# Patient Record
Sex: Female | Born: 1950 | Race: White | Hispanic: No | Marital: Married | Smoking: Never smoker
Health system: Southern US, Community
[De-identification: ages and names within clinical notes are randomized; demographics above are authoritative.]

## PROBLEM LIST (undated history)

## (undated) DIAGNOSIS — N979 Female infertility, unspecified: Secondary | ICD-10-CM

## (undated) DIAGNOSIS — E282 Polycystic ovarian syndrome: Secondary | ICD-10-CM

## (undated) DIAGNOSIS — Z974 Presence of external hearing-aid: Secondary | ICD-10-CM

## (undated) DIAGNOSIS — H8092 Unspecified otosclerosis, left ear: Secondary | ICD-10-CM

## (undated) DIAGNOSIS — F32A Depression, unspecified: Secondary | ICD-10-CM

## (undated) DIAGNOSIS — K829 Disease of gallbladder, unspecified: Secondary | ICD-10-CM

## (undated) DIAGNOSIS — R5383 Other fatigue: Secondary | ICD-10-CM

## (undated) DIAGNOSIS — H9190 Unspecified hearing loss, unspecified ear: Secondary | ICD-10-CM

## (undated) DIAGNOSIS — H838X3 Other specified diseases of inner ear, bilateral: Secondary | ICD-10-CM

## (undated) DIAGNOSIS — B0229 Other postherpetic nervous system involvement: Secondary | ICD-10-CM

## (undated) DIAGNOSIS — K869 Disease of pancreas, unspecified: Secondary | ICD-10-CM

## (undated) DIAGNOSIS — E559 Vitamin D deficiency, unspecified: Secondary | ICD-10-CM

## (undated) DIAGNOSIS — K5900 Constipation, unspecified: Secondary | ICD-10-CM

## (undated) DIAGNOSIS — R42 Dizziness and giddiness: Secondary | ICD-10-CM

## (undated) DIAGNOSIS — A630 Anogenital (venereal) warts: Secondary | ICD-10-CM

## (undated) DIAGNOSIS — Z8711 Personal history of peptic ulcer disease: Secondary | ICD-10-CM

## (undated) DIAGNOSIS — E739 Lactose intolerance, unspecified: Secondary | ICD-10-CM

## (undated) DIAGNOSIS — E538 Deficiency of other specified B group vitamins: Secondary | ICD-10-CM

## (undated) DIAGNOSIS — F329 Major depressive disorder, single episode, unspecified: Secondary | ICD-10-CM

## (undated) DIAGNOSIS — H905 Unspecified sensorineural hearing loss: Secondary | ICD-10-CM

## (undated) DIAGNOSIS — F419 Anxiety disorder, unspecified: Secondary | ICD-10-CM

## (undated) DIAGNOSIS — Z9889 Other specified postprocedural states: Secondary | ICD-10-CM

## (undated) DIAGNOSIS — H9192 Unspecified hearing loss, left ear: Secondary | ICD-10-CM

## (undated) DIAGNOSIS — R112 Nausea with vomiting, unspecified: Secondary | ICD-10-CM

## (undated) HISTORY — DX: Other fatigue: R53.83

## (undated) HISTORY — DX: Dizziness and giddiness: R42

## (undated) HISTORY — DX: Disease of gallbladder, unspecified: K82.9

## (undated) HISTORY — DX: Polycystic ovarian syndrome: E28.2

## (undated) HISTORY — DX: Vitamin D deficiency, unspecified: E55.9

## (undated) HISTORY — DX: Female infertility, unspecified: N97.9

## (undated) HISTORY — DX: Disease of pancreas, unspecified: K86.9

## (undated) HISTORY — PX: STRABISMUS SURGERY: SHX218

## (undated) HISTORY — DX: Constipation, unspecified: K59.00

## (undated) HISTORY — DX: Lactose intolerance, unspecified: E73.9

## (undated) HISTORY — DX: Other postherpetic nervous system involvement: B02.29

## (undated) HISTORY — DX: Depression, unspecified: F32.A

## (undated) HISTORY — PX: STAPEDES SURGERY: SHX789

## (undated) HISTORY — DX: Anxiety disorder, unspecified: F41.9

---
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
## 1997-05-05 ENCOUNTER — Encounter: Admission: RE | Admit: 1997-05-05 | Discharge: 1997-05-05 | Payer: Self-pay | Admitting: Sports Medicine

## 1997-09-22 ENCOUNTER — Encounter: Admission: RE | Admit: 1997-09-22 | Discharge: 1997-09-22 | Payer: Self-pay | Admitting: Family Medicine

## 1997-11-25 ENCOUNTER — Ambulatory Visit: Admission: RE | Admit: 1997-11-25 | Discharge: 1997-11-25 | Payer: Self-pay | Admitting: Family Medicine

## 1997-12-11 ENCOUNTER — Ambulatory Visit (HOSPITAL_COMMUNITY): Admission: RE | Admit: 1997-12-11 | Discharge: 1997-12-11 | Payer: Self-pay | Admitting: Family Medicine

## 1997-12-12 ENCOUNTER — Encounter: Admission: RE | Admit: 1997-12-12 | Discharge: 1997-12-12 | Payer: Self-pay | Admitting: Sports Medicine

## 1997-12-19 ENCOUNTER — Encounter: Admission: RE | Admit: 1997-12-19 | Discharge: 1997-12-19 | Payer: Self-pay | Admitting: Family Medicine

## 1998-02-07 ENCOUNTER — Ambulatory Visit (HOSPITAL_COMMUNITY): Admission: RE | Admit: 1998-02-07 | Discharge: 1998-02-07 | Payer: Self-pay | Admitting: Otolaryngology

## 1998-02-07 ENCOUNTER — Encounter: Payer: Self-pay | Admitting: Otolaryngology

## 1998-02-08 ENCOUNTER — Ambulatory Visit (HOSPITAL_BASED_OUTPATIENT_CLINIC_OR_DEPARTMENT_OTHER): Admission: RE | Admit: 1998-02-08 | Discharge: 1998-02-08 | Payer: Self-pay | Admitting: Otolaryngology

## 1998-02-26 ENCOUNTER — Encounter: Admission: RE | Admit: 1998-02-26 | Discharge: 1998-02-26 | Payer: Self-pay | Admitting: Sports Medicine

## 1998-04-20 ENCOUNTER — Encounter: Admission: RE | Admit: 1998-04-20 | Discharge: 1998-04-20 | Payer: Self-pay | Admitting: Family Medicine

## 1998-06-26 ENCOUNTER — Ambulatory Visit (HOSPITAL_COMMUNITY): Admission: RE | Admit: 1998-06-26 | Discharge: 1998-06-26 | Payer: Self-pay | Admitting: *Deleted

## 1998-12-13 ENCOUNTER — Ambulatory Visit (HOSPITAL_COMMUNITY): Admission: RE | Admit: 1998-12-13 | Discharge: 1998-12-13 | Payer: Self-pay | Admitting: Family Medicine

## 1998-12-13 ENCOUNTER — Encounter: Payer: Self-pay | Admitting: Family Medicine

## 1999-01-06 ENCOUNTER — Emergency Department (HOSPITAL_COMMUNITY): Admission: EM | Admit: 1999-01-06 | Discharge: 1999-01-06 | Payer: Self-pay | Admitting: Emergency Medicine

## 1999-02-18 ENCOUNTER — Encounter: Admission: RE | Admit: 1999-02-18 | Discharge: 1999-02-18 | Payer: Self-pay | Admitting: Family Medicine

## 1999-02-22 ENCOUNTER — Other Ambulatory Visit: Admission: RE | Admit: 1999-02-22 | Discharge: 1999-02-22 | Payer: Self-pay | Admitting: Obstetrics and Gynecology

## 1999-10-07 ENCOUNTER — Encounter: Admission: RE | Admit: 1999-10-07 | Discharge: 1999-10-07 | Payer: Self-pay | Admitting: Sports Medicine

## 1999-11-08 ENCOUNTER — Encounter: Admission: RE | Admit: 1999-11-08 | Discharge: 1999-11-08 | Payer: Self-pay | Admitting: Family Medicine

## 1999-11-14 ENCOUNTER — Encounter: Admission: RE | Admit: 1999-11-14 | Discharge: 1999-11-14 | Payer: Self-pay | Admitting: Family Medicine

## 1999-12-18 ENCOUNTER — Encounter: Payer: Self-pay | Admitting: Family Medicine

## 1999-12-18 ENCOUNTER — Ambulatory Visit (HOSPITAL_COMMUNITY): Admission: RE | Admit: 1999-12-18 | Discharge: 1999-12-18 | Payer: Self-pay | Admitting: Family Medicine

## 2000-01-14 HISTORY — PX: LASIK: SHX215

## 2000-01-22 ENCOUNTER — Encounter: Admission: RE | Admit: 2000-01-22 | Discharge: 2000-01-22 | Payer: Self-pay | Admitting: Family Medicine

## 2000-02-14 ENCOUNTER — Encounter (INDEPENDENT_AMBULATORY_CARE_PROVIDER_SITE_OTHER): Payer: Self-pay | Admitting: *Deleted

## 2000-02-18 ENCOUNTER — Encounter: Admission: RE | Admit: 2000-02-18 | Discharge: 2000-02-18 | Payer: Self-pay | Admitting: Sports Medicine

## 2000-03-05 ENCOUNTER — Ambulatory Visit (HOSPITAL_COMMUNITY): Admission: RE | Admit: 2000-03-05 | Discharge: 2000-03-05 | Payer: Self-pay | Admitting: Ophthalmology

## 2000-03-05 HISTORY — PX: BLEPHAROPLASTY: SUR158

## 2000-04-15 ENCOUNTER — Encounter: Admission: RE | Admit: 2000-04-15 | Discharge: 2000-04-15 | Payer: Self-pay | Admitting: Family Medicine

## 2000-04-22 ENCOUNTER — Encounter: Admission: RE | Admit: 2000-04-22 | Discharge: 2000-04-22 | Payer: Self-pay | Admitting: Family Medicine

## 2000-04-27 ENCOUNTER — Encounter: Admission: RE | Admit: 2000-04-27 | Discharge: 2000-04-27 | Payer: Self-pay | Admitting: Family Medicine

## 2000-05-25 ENCOUNTER — Encounter: Admission: RE | Admit: 2000-05-25 | Discharge: 2000-05-25 | Payer: Self-pay | Admitting: Family Medicine

## 2000-06-10 ENCOUNTER — Other Ambulatory Visit: Admission: RE | Admit: 2000-06-10 | Discharge: 2000-06-10 | Payer: Self-pay | Admitting: Otolaryngology

## 2000-06-10 ENCOUNTER — Encounter (INDEPENDENT_AMBULATORY_CARE_PROVIDER_SITE_OTHER): Payer: Self-pay | Admitting: Specialist

## 2000-07-09 ENCOUNTER — Encounter: Admission: RE | Admit: 2000-07-09 | Discharge: 2000-07-09 | Payer: Self-pay | Admitting: Family Medicine

## 2005-01-13 HISTORY — PX: VAGINAL HYSTERECTOMY: SUR661

## 2005-03-27 ENCOUNTER — Inpatient Hospital Stay (HOSPITAL_COMMUNITY): Admission: EM | Admit: 2005-03-27 | Discharge: 2005-04-01 | Payer: Self-pay | Admitting: Emergency Medicine

## 2005-03-27 ENCOUNTER — Ambulatory Visit: Payer: Self-pay | Admitting: Family Medicine

## 2005-03-31 ENCOUNTER — Encounter (INDEPENDENT_AMBULATORY_CARE_PROVIDER_SITE_OTHER): Payer: Self-pay | Admitting: *Deleted

## 2005-03-31 HISTORY — PX: LAPAROSCOPIC CHOLECYSTECTOMY: SUR755

## 2006-03-13 ENCOUNTER — Encounter (INDEPENDENT_AMBULATORY_CARE_PROVIDER_SITE_OTHER): Payer: Self-pay | Admitting: *Deleted

## 2006-11-17 ENCOUNTER — Ambulatory Visit (HOSPITAL_COMMUNITY): Admission: RE | Admit: 2006-11-17 | Discharge: 2006-11-17 | Payer: Self-pay | Admitting: Surgery

## 2006-11-24 ENCOUNTER — Ambulatory Visit (HOSPITAL_COMMUNITY): Admission: RE | Admit: 2006-11-24 | Discharge: 2006-11-24 | Payer: Self-pay | Admitting: Obstetrics and Gynecology

## 2006-12-08 ENCOUNTER — Encounter: Admission: RE | Admit: 2006-12-08 | Discharge: 2007-03-08 | Payer: Self-pay | Admitting: Surgery

## 2007-01-01 ENCOUNTER — Ambulatory Visit (HOSPITAL_COMMUNITY): Admission: RE | Admit: 2007-01-01 | Discharge: 2007-01-01 | Payer: Self-pay | Admitting: Surgery

## 2007-02-08 ENCOUNTER — Ambulatory Visit (HOSPITAL_COMMUNITY): Admission: RE | Admit: 2007-02-08 | Discharge: 2007-02-09 | Payer: Self-pay | Admitting: Surgery

## 2007-02-08 HISTORY — PX: LAPAROSCOPIC GASTRIC BANDING: SHX1100

## 2007-04-06 ENCOUNTER — Encounter: Admission: RE | Admit: 2007-04-06 | Discharge: 2007-04-06 | Payer: Self-pay | Admitting: Surgery

## 2008-01-15 DIAGNOSIS — E559 Vitamin D deficiency, unspecified: Secondary | ICD-10-CM | POA: Insufficient documentation

## 2008-07-27 ENCOUNTER — Ambulatory Visit (HOSPITAL_COMMUNITY): Admission: RE | Admit: 2008-07-27 | Discharge: 2008-07-27 | Payer: Self-pay | Admitting: Otolaryngology

## 2008-07-27 HISTORY — PX: IMPLANTATION BONE ANCHORED HEARING AID: SUR691

## 2009-01-28 ENCOUNTER — Inpatient Hospital Stay (HOSPITAL_COMMUNITY): Admission: EM | Admit: 2009-01-28 | Discharge: 2009-01-29 | Payer: Self-pay | Admitting: Surgery

## 2009-03-15 ENCOUNTER — Encounter: Admission: RE | Admit: 2009-03-15 | Discharge: 2009-03-15 | Payer: Self-pay | Admitting: Surgery

## 2010-04-01 LAB — BASIC METABOLIC PANEL
Creatinine, Ser: 0.74 mg/dL (ref 0.4–1.2)
GFR calc Af Amer: >60 mL/min (ref >60)
Glucose, Bld: 93 mg/dL (ref 70–99)
Potassium: 3.6 mEq/L (ref 3.5–5.1)

## 2010-04-01 LAB — CBC
HCT: 36.6 % (ref 36.0–46.0)
MCV: 80.8 fL (ref 78.0–100.0)
Platelets: 194 10*3/uL (ref 150–400)
RBC: 4.53 MIL/uL (ref 3.87–5.11)
WBC: 7.7 10*3/uL (ref 4.0–10.5)

## 2010-04-19 ENCOUNTER — Inpatient Hospital Stay (INDEPENDENT_AMBULATORY_CARE_PROVIDER_SITE_OTHER)
Admission: RE | Admit: 2010-04-19 | Discharge: 2010-04-19 | Disposition: A | Discharge status: Home / Self Care | Payer: PRIVATE HEALTH INSURANCE | Source: Ambulatory Visit | Attending: Emergency Medicine | Admitting: Emergency Medicine

## 2010-04-19 DIAGNOSIS — B029 Zoster without complications: Secondary | ICD-10-CM

## 2010-04-22 LAB — CBC
MCV: 83.3 fL (ref 78.0–100.0)
Platelets: 282 10*3/uL (ref 150–400)
WBC: 7.3 10*3/uL (ref 4.0–10.5)

## 2010-04-22 LAB — APTT: aPTT: 31 seconds (ref 24–37)

## 2010-04-22 LAB — COMPREHENSIVE METABOLIC PANEL
Albumin: 3.9 g/dL (ref 3.5–5.2)
CO2: 27 mEq/L (ref 19–32)
Sodium: 138 mEq/L (ref 135–145)

## 2010-04-22 LAB — URINALYSIS, ROUTINE W REFLEX MICROSCOPIC
Hgb urine dipstick: NEGATIVE
Ketones, ur: NEGATIVE mg/dL
Protein, ur: NEGATIVE mg/dL
pH: 7 (ref 5.0–8.0)

## 2010-04-22 LAB — DIFFERENTIAL
Eosinophils Absolute: 0 10*3/uL (ref 0.0–0.7)
Eosinophils Relative: 0 % (ref 0–5)
Lymphs Abs: 1.1 10*3/uL (ref 0.7–4.0)
Monocytes Absolute: 0.2 10*3/uL (ref 0.1–1.0)
Neutro Abs: 6 10*3/uL (ref 1.7–7.7)

## 2010-04-22 LAB — PROTIME-INR: INR: 1 (ref 0.00–1.49)

## 2010-05-28 NOTE — Op Note (Signed)
NAMEDAJAHNAE, HOLLIGAN                  ACCOUNT NO.:  [id number]   MEDICAL RECORD NO.:  [id number]          PATIENT TYPE:  AMB   LOCATION:  SDS                          FACILITY:  [facility]   PHYSICIAN:  Fannie Knee, M.D.    DATE OF BIRTH:  1950/11/02   DATE OF PROCEDURE:  07/27/2008  DATE OF DISCHARGE:  07/27/2008                               OPERATIVE REPORT   JUSTIFICATION FOR PROCEDURE:  [name] A. Joplin is a 60 year old white  female, here today for a BAHA implant for her left temporal bone to  treat single-sided deafness.  Ms. Rothschild has had almost a lifelong history  of chronic hearing loss.  She had a history of otosclerosis and multiple  previous stapedectomies of her left ear which had failed.  The original  operation was performed in [place] at [facility] on  October 27, 1983, by Dr. Satira Anis.  At that time, a house-wire  prosthesis was used.  The procedure failed and she was reoperated on  April 13, 1992, by Dr. Raelyn Ensign. [place].  At that time, the house-wire  prosthesis was found to be loose and was re-crimped on her incus.  This  procedure failed and she moved to [facility].  She was reoperated  by Dr. Dorothey Baseman on August 21, 1992.  At that time, the house-wire  prosthesis was removed and Kaylyn Layer [facility] piston wire prosthesis  measuring 3.5 mm was placed.  This procedure failed and the patient had  a maximum conductive hearing loss.  She underwent a revision  stapedectomy by myself with a 4-mm Robinson stainless steel prosthesis  with a large-well narrow shaft over a vein graft on February 03, 1998.  She never enjoyed good hearing after this and was left with a conductive  hearing loss.  On May 15, 2008, she presented to my office with a 64-month  history of sudden hearing loss in her left ear.  She described a severe  episode of vertigo associated with a sudden loss.  She was placed on  oral steroids by Dr. Stoney Bang and this did not improve her  hearing.  At that time, she was wearing a right BTE digital hearing aid and wanted  to know what could be done about her hearing.  High-resolution CT scan  of her temporal bones was performed and documented bilateral superior  semicircular canal dehiscence syndrome.  In the end, this was the reason  for her conductive hearing loss in the left ear and the reason that none  of the stapedectomies ever restored hearing for her.  She was placed on  a low-sodium diet and Dyazide and considered for intratympanic  dexamethasone treatment.  After finally deciding on what she wanted to  do, she elected to proceed with a BAHA implant of left temporal bone as  the BAHA Cordelle headband trial was very positive.  Risks,  complications, and alternatives of the procedure were explained to her.  Questions were invited and answered and informed consent was signed and  witnessed.  The procedure was scheduled for  today, July 27, 2008, under  general endotracheal anesthesia as an outpatient.  Preop audiometric  testing on June 13, 2008, documented an SRT of 20 dB in the right ear  with 92% discrimination and severe mixed hearing loss in the left ear  with the SRT of 100 dB and no testicle discrimination.   JUSTIFICATION FOR IMPATIENT SETTING:  The patient's age, need for  general endotracheal anesthesia.   JUSTIFICATION FOR OVERNIGHT STAY:  Not applicable.   PREOPERATIVE DIAGNOSES:  1. Single-sided deafness, left ear, status post multiple      stapedectomies.  2. Bilateral superior semicircular canal dehiscence syndrome.  3. Moderate high-frequency sensorineural hearing loss, right ear.   POSTOPERATIVE DIAGNOSES:  1. Single-sided deafness, left ear, status post multiple      stapedectomies.  2. Bilateral superior semicircular canal dehiscence syndrome.  3. Moderate high-frequency sensorineural hearing loss, right ear.   OPERATION:  Bone-anchored hearing aid implant, left temporal bone.   SURGEON:   Fannie Knee, MD   ANESTHESIA:  General endotracheal, Ala Dach, MD.   COMPLICATIONS:  None.   DISCHARGE STATUS:  Stable.   SUMMARY OF REPORT:  After the patient was taken to the operating room,  she was placed in the supine position.  An IV had been begun in the  holding area.  General IV induction was then performed by Dr. Ala Dach and the patient was orally intubated.  Eyelids were taped shut.  She was properly positioned and monitored.  Elbows and ankles were  padded with foam rubber and a time-out was performed.  The patient was  then turned 90 degrees and in reverse Trendelenburg position.  A small  amount of hair was clipped in the left postauricular area.  Hair was  taped and a stocking cap was applied.  A BAHA template was then used to  mark an area of 55 mm from her superior canal wall in the postauricular  area.  The site for a skin graft was then marked.  The site was then  infiltrated with 8.2 mL of 1% Xylocaine with 1:200,000 epinephrine.  Her  left ear and scalp were then prepped with Betadine and draped in the  standard fashion for a BAHA implant.   Using a BAHA dermatome, an inferiorly-based skin graft was elevated.  The soft tissue was then removed with an electrocautery unit on cutting  mode.  Soft tissue was elevated as an inferiorly-based flap.  The soft  tissue was not removed until the BAHA hole was drilled.   Next, the soft tissue was removed down to mucoperiosteum.  A cruciate  incision was then made in the mucoperiosteum in the site of previously  tattooed site for the implant.  The four flaps were elevated with  raspatorium.  Using a 3-mm drill guide, a 3-mm hole was drilled using  continuous suction irrigation.  The hole was drilled at 2000 rpm and  great care was taken not to burn the bone.  Continuous suction  irrigation was used and the hole was drilled slowly stopping frequently  to clean bone dust from the drill bed.  There was  solid bone at 3 mm and  therefore the guard and drill guide was removed and the hole was drilled  to 4 mm depth and gained too solid bone.  Next, a 4-mm countersink bur  was used to widen the 4-mm hole and drill the countersink on the margin.  Again, this was done with continuous suction  irrigation and frequent  cleaning of bone dust from the drill bed and the great care taken not to  burn the bone or overheat the bone.   Next, the inferiorly-based soft tissue flap was removed.  The fat and  soft tissue from around the perimeter of the site was then removed with  15 blade and cutting cautery.  Bleeding was controlled with unipolar  cautery.  The site was copiously irrigated with bacitracin-containing  saline.   Next, a 4-mm flange fixture, [id number], number [id number], 5.5 mm in length was  opened and attached to the drill.  Using 40 cm of torque, the flange  fixture was implanted into the bone letting the screw guide itself into  the hole with gentle pressure.  Again, suction irrigation was used  during this portion of the procedure.   It should mention that prior to placing the flange fixture, a 4-mm punch  was used to punch a hole in the skin graft in the designated site for  the abutment.  Skin graft was then taken and placed over the abutment  and the skin graft was then sutured to the donor site using interrupted  3-0 Vicryls in the corners and using a running 4-0 Monocryl along the  four edges.  The skin graft was then pie-crusted and then quilted to the  mucoperiosteum using interrupted 4-0 Vicryls.  Skin graft was healthy at  the termination of the procedure.  The site was copiously irrigated with  bacitracin-containing saline.  An Adaptic foam sponge dressing was then  applied and held in place with a healing cap.  Bacitracin bacitracin  ointment was applied to the skin graft site, the Adaptic and surrounding  the sponge dressing.  The ear was then padded with Telfa and cotton and   a standard adult Glasscock mastoid dressing was applied loosely in the  standard fashion.  The patient was then awakened, extubated, and  transferred to her hospital bed.  She tolerated the general endotracheal  anesthesia and the procedures well and left the operating room in stable  condition.   TOTAL FLUIDS:  800 mL.   TOTAL BLOOD LOSS:  Less than 10 mL.   All sponge, needle, and cotton ball counts were correct at the  termination of the procedure.  The patient received 1 g of IV Ancef and  4 mg of IV Zofran at the beginning and 4 mg at the end, and 10 mg of  Decadron IV.   Ms. Chew will be discharged today as an outpatient with her husband.  He  will be instructed to return her to my office on August 03, 2008, at 4:20  p.m. for followup.   DISCHARGE MEDICATIONS:  1. Cipro 500 mg p.o. b.i.d. x10 days with food.  2. Percocet 5/325, #30 one to two p.o. q.4 h. p.r.n. pain.  3. Bactroban ointment to apply around the sponge dressing and      eventually on the skin graft site b.i.d.  4. Phenergan suppositories 25 mg 1 PR q.6 h. p.r.n. nausea, #2.   She is to keep her head elevated on 2 pillows for the next 3 evenings,  avoid water exposure until returning to my office, and follow a regular  diet.  She is to call to [phone] for any postoperative problems  directly related to the procedure.  She will be given both verbal and  written instructions.  In approximately 6 weeks, her sound processor  will be attached to the abutment and  will be programmed for her.      Fannie Knee, M.D.  Electronically Signed     EMK/MEDQ  D:  07/27/2008  T:  07/27/2008  Job:  [id number]   cc:   Stoney Bang, MD

## 2010-05-28 NOTE — Op Note (Signed)
Denise Rice, Denise Rice                  ACCOUNT NO.:  [id number]   MEDICAL RECORD NO.:  [id number]          PATIENT TYPE:  OIB   LOCATION:  [place]                         FACILITY:  [facility]   PHYSICIAN:  Fenton Malling. Lucia Gaskins, M.D.  DATE OF BIRTH:  11/25/1950   DATE OF PROCEDURE:  02/08/2007  DATE OF DISCHARGE:                               OPERATIVE REPORT   PREOPERATIVE DIAGNOSIS:  Morbid obesity with weight of 249 with body  mass index (BMI) of 45.9.   POSTOPERATIVE DIAGNOSES:  1. Morbid obesity with weight 249 and body mass index (BMI) of 123XX123.  2. Umbilical hernia.   PROCEDURE:  Laparoscopic banding with an AP Standard band.   SURGEON:  Fenton Malling. Lucia Gaskins, M.D.   FIRST ASSISTANT:  Isabel Caprice. Hassell Done, M.D.   ANESTHESIA:  General endotracheal.   ESTIMATED BLOOD LOSS:  Minimal.   INDICATIONS FOR PROCEDURE:  Ms. Pereda is a 60 year old white female who  is a patient of Dr. Rachell Cipro, who has been morbidly obese much of  her adult life.  She has been in our preoperative bariatric program and  comes in with interest for a lap band.  The indications and potential  complications of a lap band were explained to the patient.  Potential  complications include but are not limited to bleeding, infection, bowel  injury, slippage, and erosion of the band.   OPERATIVE NOTE:  Patient placed in a supine position, given 1 gm of  Ancef this procedure.  Had PAS stockings in place.  Her abdomen was  prepped with Betadine solution and sterilely draped.   A time-out was held, identifying the patient and the procedure.  I  accessed the abdominal cavity from the left lower quadrant with an 11 mm  Ethicon Optiview trocar.  I placed five additional trocars, a 5 mm  subxiphoid trocar for the liver retractor, a 15 mm right subcostal, an  11 mm right paramedian, an 11 mm left paramedian, and a 5 mm left  lateral trocar.   Abdominal exploration was carried out.  Right and left lobes of the  liver were  unremarkable.  The stomach was unremarkable.  The bowel that  I could see was unremarkable.  She did have about a 1.5 to 2 cm  umbilical hernia, which had some fat in that fell out upon entering the  abdominal cavity, so had no evidence of incarceration.  I thought the  hernia was small enough that it could be left alone at this time but  then addressed at a later date.  I will discuss this with her husband in  the postoperative area.   I then turned my attention to the upper abdomen.  I placed a Nathanson  retractor onto the left lobe of the liver.  I then used an Iron man  retractor to hold this.   I then exposed the left lateral part of the esophagogastric junction at  the angle of His.  I looked through the gastrohepatic ligament,  identified the right crus, and passed a finger dissector behind the  stomach to the angle  of His.  The finger dissector appeared to go in  easily to the right location.  I then advanced an AP standard lap band  into the abdominal cavity, and I passed it around the proximal stomach.   The band was then cinched down over the sizing tube of the esophagus.  This all seemed to fit very well and not be too tight.   I then imbricated the stomach over the band anteriorly and laterally  with three sutures of 0 Ethibond suture and tied these down with a tie  knot on the gastric plication.   The band seemed to lay flat in the plication.  There was a little bit of  oozing on the upper side of the stomach.  This altogether stopped by the  end of this part of the dissection.  A photo was taken and placed in the  chart.   The port tube was then pulled out through the right paramedian incision.  An incision made at the anterior abdominal wall.  The Silastic tubing  was attached to the reservoir and sewn in place with four 2-0 Prolene  sutures.  Subcutaneous tissue closed with 3-0 Vicryl sutures.  The skin  was closed with a 5-0 Monocryl suture.  Each wound was  covered then with  Dermabond.  The patient tolerated the procedure well.  Sponge and needle  counts were correct at the end of the case.  Estimated blood loss was  minimal.      Fenton Malling. Lucia Gaskins, M.D.  Electronically Signed     DHN/MEDQ  D:  02/08/2007  T:  02/08/2007  Job:  [id number]   cc:   Rachell Cipro, M.D.

## 2010-05-28 NOTE — H&P (Signed)
Denise Rice, Denise Rice                  ACCOUNT NO.:  [id number]   MEDICAL RECORD NO.:  [id number]          PATIENT TYPE:  AMB   LOCATION:  SDS                          FACILITY:  [facility]   PHYSICIAN:  Denise Rice, M.D.    DATE OF BIRTH:  [birth year]   DATE OF ADMISSION:  07/27/2008  DATE OF DISCHARGE:  07/27/2008                              HISTORY & PHYSICAL   ADMISSION DIAGNOSIS:  Single-sided deafness, left ear.   DISCHARGE DIAGNOSIS:  Single-sided deafness, left ear.   OPERATION:  Baha implant, left temporal bone.   SURGEON:  Denise Knee, MD   ANESTHESIA:  General endotracheal, Ala Dach, MD   COMPLICATIONS:  None.   DISCHARGE STATUS:  Stable.   HISTORY OF PRESENT ILLNESS:  Denise Rice is a very pleasant 60-year-  old white female, here today for a Baha implant, left temporal bone to  treat single-sided deafness.  Denise Rice has had a long history of chronic  hearing loss.  She presented to my office with a history of otosclerosis  and multiple previous stapedectomies of her left ear, which all failed.  Originally, her left ear was operated on in [place] at [facility] on October 27, 1983, by Dr. Satira Anis, at that time [facility]-  type wire prosthesis was used.  The procedure failed and she was  reoperated on April 13, 1992, by Dr. Raelyn Ensign. Dara Lords, at that time the  House wire prosthesis was found to be loose and was re-crimped on her  incus.  This procedure failed.  She moved to [place] [facility], and her  left ear was reoperated upon by Dr. Dorothey Baseman on August 21, 1992,  at that time the [facility] wire prosthesis was removed, and a Silverstein  Shea Teflon piston wire prosthesis measuring 3.5 mm was placed.  This  procedure failed, and the patient was left with a maximum conductive  hearing loss, documented on a preoperative audiogram on February 07, 1998.  On February 08, 1998, I performed a revision stapedectomy of her  left ear with 4.0 mm  Robinson stainless steel stapedectomy prosthesis  with a large valve and a narrow shaft over a vein graft.  The patient  never enjoyed a good result.  It was thought that she had an inner  conductive hearing loss.  However, over time, it was recently discovered  that she suffers from bilateral superior semicircular canal dehiscence  syndrome.   This was discovered when the patient presented to my office on May 15, 2008, with a 29-month history of sudden hearing loss of her left ear.  She had been evaluated by Dr. Stoney Bang of [place], who placed  her on steroids very appropriately, but she did not gain any return in  her hearing.  She underwent a CT scan of her temporal bones and this is  the time that I had discovered that she had bilateral superior  semicircular canal dehiscence syndrome.  She had been placed on a low-  sodium diet and Dyazide in case she had  endolymphatic hydrops, this also  did not improve her hearing.   The patient was successfully wearing a hearing aid in her right ear, but  desired to hear better from her left ear as she is very active.  She was  counseled that she could do nothing, continue to wear the hearing aid in  the right ear, wear a Bi-Cross aid Rice or proceed with a Baha implant  of her left temporal bone.  She elected to proceed with a Baha implant  after trying the Cordelle headband.  She liked the hearing very much.  Preop hearing on June 13, 2008, showed an SRT of 20 dB in her right ear  with 92% discrimination.  She had a 100 dB SRT in the left ear with no  testable discrimination ability.   Risks, complications, and alternative of Baha implantation and left  temporal bone were explained to her.  Questions were invited and  answered and informed consent was signed and witnessed.   JUSTIFICATION:  The operation was scheduled for July 27, 2008, at 7:30  a.m. [facility] OR, room #3 under general endotracheal anesthesia.   PAST MEDICAL  HISTORY:  The patient has undergone multiple stapedectomies  of her left ear as previously mentioned above.  She has also undergone a  cholecystectomy in 2007, a hysterectomy in 2007, and a lap band in 2009.   MEDICATIONS:  1. Wellbutrin 37.5 mg.  2. Ambien 10 mg.   ALLERGIES:  She was not allergic to any medications.  She had had no  serious illnesses or injuries.   FAMILY HISTORY:  Positive for congestive heart failure and cancer.   SOCIAL HISTORY:  She is married, works as a Clinical research associate at  [employer], has an Loss adjuster, chartered.  She does not use  tobacco products or alcohol.  She denied drug use or exposure to HIV.   REVIEW OF SYSTEMS:  Negative for lung, liver, kidney, heart disease,  diabetes mellitus, thyroid dysfunction, or any psychiatric illness.   PHYSICAL EXAMINATION:  VITAL SIGNS:  Stable.  GENERAL:  She was awake, alert, spontaneous, coherent, and logical.  HEENT:  Facial function was intact.  Her head was normocephalic.  External ears and canals were stable.  Her right tympanic membrane was  clear and mobile.  Her left tympanic membrane had an operated appearance  from the previous stapedectomies.  External and internal nasal exams  were within normal limits.  Oral cavity, lips, tongue, and palate  normal.  NECK:  Negative.  CHEST:  Clear.  HEART:  Normal sinus rhythm.  BREAST:  Not performed.  ABDOMEN:  Benign.  GENITALIA AND RECTAL:  Not performed.  EXTREMITIES:  Unremarkable with good motor strength.  NEUROLOGIC:  Physiologic with the exception of hearing loss in both  ears, left greater than right.   Audiometric testing on June 13, 2008, documented moderate high-frequency  sensorineural hearing loss in the right ear with an SRT of 20 dB in 92%  discrimination.  She had a severe-to-profound mixed hearing loss in the  left ear with an SRT of 100 and no testable discrimination.   ADMISSION LABORATORY DATA:  Hemoglobin of 14.1,  hematocrit of 40.6, and  white blood cell count of 7300.  PT was 13.5, PTT 31, and INR 1.0.  Electrolytes were within normal limits.  Urinalysis was unremarkable.  Chest x-ray showed no active disease.  No cardiopulmonary disease.  EKG  showed normal sinus rhythm.   IMPRESSION:  1.  Single-sided deafness, left ear, status post multiple failed      stapedectomies.  2. Bilateral superior semicircular canal dehiscence syndrome.  3. Moderate high-frequency sensorineural hearing loss, right ear.   PLAN:  Again, the patient was counseled that she could do nothing.  Continue to wear hearing aid in the right ear, wear a Bi-Cross hearing  aid Rice or proceed with a Baha implant of her left temporal bone.  After a Cordelle Baha trial, she elected to proceed with a Baha implant  of her left temporal bone.  She was counseled about possible repair of  the bilateral superior semicircular canal dehiscences.  She elected not  to proceed with any surgical repair of the dehiscences at this time.  Cap risks, complications, and alternatives of Baha implantation again  were explained to her.  Questions were invited and answered.  Informed  consent was signed and witnessed.  No guarantees were made.   PROCEDURE:  Left Baha implant was scheduled for July 27, 2008, under  general endotracheal anesthesia at [facility] OR, room #3 at 7:30 a.m.      [facility] Rice, M.D.  Electronically Signed     EMK/MEDQ  D:  07/27/2008  T:  07/27/2008  Job:  [id number]   cc:   Stoney Bang, MD

## 2010-05-31 NOTE — Consult Note (Signed)
NAMEEMMALEE, KOPEC                  ACCOUNT NO.:  [id number]   MEDICAL RECORD NO.:  [id number]          PATIENT TYPE:  INP   LOCATION:  6740                         FACILITY:  [place]   PHYSICIAN:  Marcello Moores A. Cornett, M.D.DATE OF BIRTH:  [birth year]   DATE OF CONSULTATION:  03/28/2005  DATE OF DISCHARGE:                                   CONSULTATION   ADMITTING PHYSICIAN:  Teaching service.   CONSULTING SURGEON:  Thomas A. Cornett, M.D.   NEPHROLOGIST:  Windy Kalata, M.D.   REASON FOR CONSULTATION:  Biliary pancreatitis.   HISTORY OF PRESENT ILLNESS:  Ms. Jacober is a 60 year old female patient with a  known history of idiopathic membranous glomerulonephropathy, stage II, as  well as polycystic ovarian syndrome, hypertension.  She reports an acute  onset of epigastric abdominal pain associated with nausea, vomiting around 3  a.m., awakened her from sleep.  On March 27, 2005, she presented to [facility].  Lab work demonstrated elevated LFTs and elevated pancreatic enzymes.  She was sent for a CT of the abdomen at [place], this  demonstrated patient with mild pancreatitis, inconclusive regarding  gallstones.  The patient was sent to [facility] for further evaluation and  admission.  A subsequent ultrasound of the abdomen revealed multiple  gallstones, common duct borderline at 4.2 mm, no obvious peripancreatic  fluid collection.  Patient currently is on the surgical floor on IV fluids,  n.p.o. status.  Her LFTs are beginning to trend down as well as amylase and  lipase are decreasing.  She still has intermittent abdominal pain.  Yesterday her pain was constant and unrelenting.  She has had no further  nausea and vomiting.  She does report a similar episode, much less severe,  about 2-3 weeks ago.   REVIEW OF SYSTEMS:  No fevers, chills or myalgias.  GI:  As per the History  of Present.  No melena, hematochezia, hematemesis.   FAMILY MEDICAL HISTORY:  Positive for  coronary artery disease, cancer, CVA,  diabetes, hypertension, alcoholism and depression.   SOCIAL:  She does not drink or smoke.  She works as a Environmental education officer.  She  has an 81 year-old daughter at home, she is married.   PAST MEDICAL HISTORY:  1.  Polycystic ovarian syndrome.  2.  Obesity.  3.  Hypertension.  4.  Idiopathic membranous glomerulonephropathy, stage II.  5.  Rotator cuff tendinitis.  6.  Otosclerosis of the left ear.   PAST SURGICAL HISTORY:  1.  Multiple surgeries to the left ear.  2.  Prior strabismus surgery.  3.  Renal biopsy.  4.  Endometrial biopsy.  5.  Colposcopy.   ALLERGIES:  MERIDIA, WHICH CAUSES HYPERTENSION.   CURRENT MEDICATIONS:  Cozaar, Protonix, morphine p.r.n., Lasix, Phenergan  p.r.n., Ambien p.r.n. and Tylenol p.r.n.   PHYSICAL EXAM:  GENERAL:  A pleasant female patient currently complaining of  intermittent epigastric abdominal pain.  VITAL SIGNS:  Temperature 98.3, BP 100/40, pulse 71, respirations 18.  NEURO:  She is alert and oriented x3, moving all extremities x4 without  focal deficits.  HEENT:  Head is normocephalic, sclera not injected.  NECK:  Supple, no adenopathy.  CHEST:  Bilateral lung sounds are clear to auscultation.  Respiratory effort  is nonlabored on room air.  CARDIAC:  S1, S2.  No rubs, murmurs, thrills.  No gallops, no JVD.  Pulses  regular.  ABDOMEN:  Is obese but soft.  Tender in the epigastric area with positive  Murphy's sign.  Bowel sounds are present and there is no left upper quadrant  pain.  EXTREMITIES:  Are symmetrical in appearance without edema, cyanosis,  clubbing.   LABS:  Sodium 140, potassium 3.6, CO2 26, BUN 8, creatinine 0.8.  AST  initially was 563, now down to 320; ALT 232, now 346; alkaline phos 102, now  110; total bilirubin 1.6, now 1.1; amylase 697, now 735; lipase 1620, 177.   DIAGNOSTICS:  A CT of the abdomen and pelvis was done at [place].  The films were not available  for review but verbal report of mild  pancreatitis, no pseudocyst, no abscess.  Ultrasound of the abdomen shows  multiple small gallstones, bile duct 4.6 mm.   IMPRESSION:  1.  Acute cholecystitis.  2.  Biliary pancreatitis, improving.  3.  Transaminitis, improving.  4.  History of renal disease and hypertension as noted.   PLAN:  1.  Bowel rest including full n.p.o. status due to pancreatitis.  Continue      IV fluid hydration at current rate.  2.  Will follow hepatic and pancreatic enzymes.  We will allow the pancreas      a period of time to cool down, usually about 48 hours, before proceeding      with laparoscopic cholecystectomy.  If patient's symptoms worsen in      regards to pancreatitis or if the enzymes do not trend down, patient may      benefit from GI consult to evaluate      for possible ERCP and stone retrieval.  3.  Patient has no leukocytosis or fever at this time, but due to potential      for obstructive cholecystitis and ascending cholangitis will start      empiric Unasyn IV.      Alma Lissa Merlin, N.P.      Thomas A. Cornett, M.D.  Electronically Signed    ALE/MEDQ  D:  03/28/2005  T:  03/30/2005  Job:  [id number]   cc:   Windy Kalata, M.D.  Fax: [phone]

## 2010-05-31 NOTE — Op Note (Signed)
[name]  Patient:    Denise Rice, Denise Rice                         MRN: [id number] Proc. Date: 03/05/00 Adm. Date:  [id number] Disc. Date: [id number] Attending:  Lawernce Pitts                           Operative Report  PREOPERATIVE DIAGNOSIS:  Blepharochalosis with visual impairment.  POSTOPERATIVE DIAGNOSIS:  Blepharochalosis with visual impairment.  PROCEDURE:  Upper eyelid blepharoplasty.  SURGEON:  Robert L. Katy Fitch, M.D.  ANESTHESIA:  1% Xylocaine with epinephrine as local anesthetic.  INDICATIONS AND JUSTIFICATION FOR THE PROCEDURE:  [name] is a patient seen in my office on January 27, 2000, referred by herself, specifically with a complaint of occasional diplopia and of heavy eyelids with an onset of about a year before she was seen.  The heavy eyelids seem to improve during the day, and this improves only with forceful opening.  She reports that she did have strabismus surgery at age 41 and at age 70.  She feels that she must pull the skin of her lids up in order to improve her vision and that the redundant skin of her upper lids does cause some reduced vision.  The pressures were found to be 12 in the right, 13 in the left, and her vision was corrected to 20/20 minus in the right and 2/20 in the left.  The pupils were normal.  She does have an intermittent exotropia, which probably accounts for the double vision which is present occasionally.  The skin of her upper eyelids does droop down somewhat and could interfere somewhat with her vision.  This was demonstrated with visual field testing, and photographs were taken.  Conjunctiva is normal. The left cornea has some old scarring.  The lens, anterior chamber, and fundus exams are normal.  After discussing this problem with her, she felt that she did want to have upper eyelid blepharoplasty in order to improve the visual symptoms.  Medically she should be stable with respect to having  these procedures performed.  JUSTIFICATION FOR PERFORMING THE PROCEDURE IN AN OUTPATIENT SETTING:  Routine.  JUSTIFICATION FOR OVERNIGHT STAY:  None.  DESCRIPTION OF PROCEDURE:  The patient arrived in the minor surgery room at [facility] and was prepped and draped in the routine fashion.  The skin to be excised was carefully demarcated, and each upper eyelid was anesthetized with a frontal lid block using 1% Xylocaine with epinephrine. Next, the skin was carefully incised using scissors and forceps, and some underlying fatty tissue was also excised.  Bleeding was controlled with pressure.  Each wound was sutured with a running 6-0 nylon suture, and pressure patches were applied.  The patient then left the minor operating room, having done nicely.  FOLLOW-UP CARE:  The patient is to remove the patches in several hours.  She is to use warm compresses twice daily to clean the eyes and for comfort.  She is to be seen in my office in five days to have the sutures removed.  She is to use Polysporin ointment inside her lower eyelids at night. DD:  03/05/00 TD:  03/06/00 Job: [id number]

## 2010-05-31 NOTE — Discharge Summary (Signed)
NAMEBRADLEY, VOORHEIS                  ACCOUNT NO.:  [id number]   MEDICAL RECORD NO.:  [id number]          PATIENT TYPE:  INP   LOCATION:  6740                         FACILITY:  [facility]   PHYSICIAN:  Leary A. Walker Rice, M.D.    DATE OF BIRTH:  [birth year]   DATE OF ADMISSION:  03/27/2005  DATE OF DISCHARGE:  04/01/2005                                 DISCHARGE SUMMARY   ADMISSION DIAGNOSES:  1.  Acute pancreatitis.  2.  Polycystic ovary.  3.  Obesity.  4.  Depressive disorder.  5.  Hearing loss, not otherwise specified.  6.  Idiopathic membranous glomerular nephropathy.   DISCHARGE DIAGNOSIS:  1.  Acute pancreatitis.  2.  Gallstone pancreatitis.  3.  Hypertension.  4.  Obesity, not otherwise specified.   CONSULTATIONS:  Surgery consultation.   PROCEDURES:  Abdominal ultrasound which revealed several gallstones in the  gallbladder with a dilated duct between 4 to 6 mm.   HOSPITAL COURSE:  In brief, Denise Rice is a 60 year old female who presented  to [place] from [facility] with a history of nausea and vomiting the day prior  to admission. She had previously been seen at the [facility] and was  diagnosed with acute pancreatitis on CT scan that did not show any  pseudocyst or chronic pancreatitis or evidence of necrosis. She was sent to  [place] for management of the acute pancreatitis. She has no alcohol history.  Does have a history of gallbladder disease as well as a history of peptic  ulcer disease. She denied any fever at the time of admission.   For her acute pancreatitis, we did receive an abdominal ultrasound to  further look into the etiology of her diagnosis and ultrasound was positive  for several gallstone in the gallbladder but none obstructing the duct;  however, there was dilatation at the duct between 4 to 6 mm. On  presentation, the patient's chemistry panel showed electrolytes with sodium  137, potassium 4.0, chloride 105, bicarb 18, BUN 25, creatinine 0.6 and  glucose  of 102. The patient did have elevated AST at 563 as well as ALT at  263. Her alk phos was also increased at 102 and a GGT was obtained which was  elevated at 117. Her total bilirubin was elevated at 1.6. Amylase initially  697 and lipase 1620. The patient was given IV fluids and was hydrated and  placed on morphine 1 milligram q.6h. for adequate pain control. Surgery was  consulted in order to perform a laparoscopic cholecystectomy. This was  performed on March 31, 2005. There were no postoperative complications. The  patient was tolerating a clear diet and this was advanced as tolerated per  patient. Her pain was adequately controlled with p.o. pain medicines and her  electrolytes were stable at discharge including an AST of 72, an ALT of 108,  amylase of 70 and lipase of 19 which were completely normal. Her T-bili had  decreased to 0.4. All other electrolytes were stable including potassium of  3.6.   For the patient's hypertension, she was stable and continued  on her home  medications of Cozaar 50 as well as Lasix 40 and she was taking 40 mEq K-Dur  p.o. daily with this. All other medical conditions were not addressed during  this hospitalization and were stable at discharge including history of  membranoproliferative glomerulonephritis. Urinalysis was obtained upon  admission and was negative for protein. She is followed at [facility] for this problem.   DISCHARGE MEDICATIONS:  1.  Losartan 50 milligrams p.o. daily, prescription given for 30.  2.  Lasix 40 milligrams p.o. daily.  3.  K-Dur 40 mEq by mouth daily.  4.  Phenergan 25 milligrams p.o. q.8h. as needed for nausea and vomiting,      #20 given.  5.  Vicodin 5/500 1 tablet p.o. every 6 hours as needed for pain,      prescription given #20.   She was to follow up with Dr. Lucia Gaskins and given the number [phone] and call  to be seen within the next two weeks. She is return to the emergency   department or clinic for drainage from the incision site, redness, fever,  recurrent abdominal pain or inability to tolerate food or liquid p.o.      Denise Rice, M.D.    ______________________________  Denise Rice, M.D.    MR/MEDQ  D:  04/01/2005  T:  04/02/2005  Job:  [id number]

## 2010-05-31 NOTE — H&P (Signed)
Denise Rice, Denise Rice                  ACCOUNT NO.:  [id number]   MEDICAL RECORD NO.:  [id number]          PATIENT TYPE:  EMS   LOCATION:  MAJO                         FACILITY:  [facility]   PHYSICIAN:  Madeleine B. Vanstory, M.D.DATE OF BIRTH:  11-03-1950   DATE OF ADMISSION:  03/27/2005  DATE OF DISCHARGE:                                HISTORY & PHYSICAL   CHIEF COMPLAINT:  Abdominal pain, nausea and vomiting.   HISTORY OF PRESENT ILLNESS:  This is a 60 year old female with sharp  epigastric abdominal pain, onset at 3 a.m. this morning with associated  nausea and vomiting.  First episode of vomiting had a small amount of blood.  The patient went to [place], was sent for a CT with diagnosis of  pancreatitis.  Sent to the emergency room for evaluation. The patient has  not had emesis since 11:15 this morning. The patient denies ETOH.  She does  have history of gallbladder disease and history of peptic ulcer disease  which has been stable off medications.  No recent travel, no change in diet.  The patient had a similar episode 2 weeks ago which lasted 5 hours and  resolved spontaneously without treatment.  The patient currently is without  nausea and emesis but still has mild abdominal pain without treatment.  The  patient denies sick contacts.   REVIEW OF SYSTEMS:  Negative for fevers, chills, chest pain, palpitations,  shortness of breath, cough, dysuria, rashes.  Positive for abdominal pain in  the epigastrium with previous nausea and emesis.   PROBLEM LIST:  1.  Hypertension.  2.  Renal disease: Idiopathic membranous glomerulonephropathy, stage II,      managed by Dr. Al Corpus at [facility]  3.  Polycystic ovary.   MEDICATIONS:  1.  Lasix 40 mg p.o. daily.  2.  Cozaar 50 mg p.o. daily.   ALLERGIES:  MERIDIA causes hypertension.   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  Neither a smoker nor drinker.  Healthy diet.  She is married.   FAMILY HISTORY:  Positive for CAD, cancer, CVA, diabetes, hypertension,  alcoholism, and depression.   PHYSICAL EXAMINATION:  VITAL SIGNS:  Afebrile at 97.1, heart rate 74, blood  pressure 121/84, O2 saturation 98% on room air, pulse 20.  GENERAL: This is an overweight Caucasian female in no acute distress, alert  and oriented x3.  HEENT:  Normocephalic.  Extraocular movements intact.  Pupils equal, round,  and reactive to light.  Moist mucous membranes.  LUNGS: Clear to auscultation bilaterally.  HEART: Regular rate and rhythm. No murmurs, rubs, or gallops.  ABDOMEN: No guarding, no rigidity.  Tenderness to palpation in the  epigastrium and suprapubic.  EXTREMITIES:  1+ pedal edema bilaterally, pulses 2+ peripherally.  GENITALIA:  Not performed secondary to bed evaluation in hallway.  NEUROLOGIC: No focal deficits.  Cranial nerves II-XII intact, 5/5 strength  bilaterally.   LABORATORY DATA:  Alkaline phosphatase 102, amylase 697, lipase 1620.  GGT  17.  Electrolytes stable with potassium 4, sodium 137, creatinine 0.6.  AST  elevated at 563.  White count 7.8, hemoglobin 11.9, platelets 260.   Abdominal CT showed mild pancreatitis, no abscess, no pseudocyst, no  calcifications, no sign of gallbladder disease, stones, or thickening.   ASSESSMENT AND PLAN:  This is a 60 year old female.  1.  Abdominal pain epigastric and suprapubic.  CT showed mild pancreatitis.      No abscess, no cyst, no gallbladder disease.  A patient with a history      of peptic ulcer disease. Will guaiac stool and start a PPI.  Patient      with history of gallbladder disease.  GGT within normal limits and no      sign of gallbladder disease on CT.  Amylase and lipase elevated, and AST      elevated.  Alkaline phosphatase within normal limits.  Nausea and      vomiting resolved without treatment.  No elevated white blood count.      The patient is afebrile.  We will admit the patient, hydrate, follow up      her  amylase and lipase, ultrasound her gallbladder to rule out stone.      Will treat with GI cocktail, IV fluids 125 mL an hour, and Phenergan      p.r.n.  This is likely an idiopathic pancreatitis versus gallstone being      cast, although gastroenteritis is in the differential.  2.  Renal.  Patient with stage II membranous glomerulonephropathy.      Creatinine 0.6. The patient states Dr. Al Corpus her renal doctor states her      disease is stable.  Will check a UA, evaluate proteinuria.  3.  Hypertension.  Continue Cozaar, stable currently.  4.  Pedal edema.  Continue Lasix. The patient is not short of breath and      saturating 92% on room air.      Briscoe Deutscher Smith Mince, M.D.     MBV/MEDQ  D:  03/27/2005  T:  03/28/2005  Job:  [id number]

## 2010-05-31 NOTE — Discharge Summary (Signed)
NAMELACI, PFENDER                  ACCOUNT NO.:  [id number]   MEDICAL RECORD NO.:  [id number]          PATIENT TYPE:  INP   LOCATION:  [id number]                         FACILITY:  [facility]   PHYSICIAN:  Richardine Service, M.D.    DATE OF BIRTH:  1950-03-26   DATE OF ADMISSION:  03/27/2005  DATE OF DISCHARGE:  04/01/2005                                 DISCHARGE SUMMARY   Audio too short to transcribe (less than 5 seconds)      Richardine Service, M.D.     MR/MEDQ  D:  04/01/2005  T:  04/01/2005  Job:  [id number]

## 2010-05-31 NOTE — Op Note (Signed)
Denise Rice, Denise Rice                  ACCOUNT NO.:  [id number]   MEDICAL RECORD NO.:  [id number]          PATIENT TYPE:  INP   LOCATION:  6740                         FACILITY:  [facility]   PHYSICIAN:  Fenton Malling. Lucia Gaskins, M.D.  DATE OF BIRTH:  10/16/1950   DATE OF PROCEDURE:  03/31/2005  DATE OF DISCHARGE:                                 OPERATIVE REPORT   PREOPERATIVE DIAGNOSIS:  Gallstone pancreatitis with chronic cholecystitis,  cholelithiasis.   POSTOPERATIVE DIAGNOSIS:  Gallstone pancreatitis with chronic cholecystitis,  cholelithiasis.   PROCEDURE:  Laparoscopic cholecystectomy with intraoperative cholangiogram.   SURGEON:  Fenton Malling. Lucia Gaskins, M.D.   FIRST ASSISTANT:  Erin Hearing, PA   ANESTHESIA:  General endotracheal.   ESTIMATED BLOOD LOSS:  Minimal.   INDICATIONS FOR PROCEDURE:  Denise Rice is a 60 year old white female who was  admitted on March 29, 2005 with acute pancreatitis felt to be secondary to  gallstones.   Her amylase has improved.  Her symptoms have improved.  She is now ready for  elective cholecystectomy.   The indications and potential complications of the operation were explained  to the patient.  The potential complications include, but are not limited  to, bleeding, infection, bile duct injury, and open surgery.   DESCRIPTION OF PROCEDURE:  The patient was placed in the supine position and  given a general endotracheal anesthetic as supervised by Dr. Sherren Kerns.  Her abdomen was prepped with Betadine solution and sterilely draped.   An infraumbilical incision was made, with sharp dissection carried down to  the abdominal cavity.  A 0-degree 10-mm laparoscope was inserted through a  12-mm Hasson trocar.  The Hasson trocar was secured with a 0 Vicryl suture.  Three trocars were placed, a 10-mm subxiphoid trocar, a 5-mm right mid-  subcostal, and a 5-mm lateral subcostal trocar.   The abdomen was noted to be fairly tight, probably in part due to the  patient's size and in part due to her pancreatitis.  Her liver looked fine,  though she did have some Fitz-Hugh-Curtis adhesions over the liver.  Her  stomach was mildly dilated.   The gallbladder was noted to be distended.  It was grabbed and rotated  cephalad.  Hard resection carried out along the cystic duct/gallbladder  junction.  The cystic duct was noted to be fairly large, maybe as much as 8  or 9 mm in diameter.   I did shoot an intraoperative cholangiogram.  The intraoperative  cholangiogram was shot using cutoff Taut catheter inserted through a 54-  gauge Jelco.  The 14-gauge Jelco was secured with an Endoclip.  In  intraoperative cholangiogram, I used about 15 cc of contrast.  It showed  free filling of the common bile duct at the hepatic radicals.  There was  some minimal drainage into the duodenum.  I saw no filling defect.  My  feeling is that it drained poorly because of probably edema around her  ampulla of Vater.  I did not see any obvious filling defect or stone, and I  felt that no further diagnostic testing needed  to be done at this time.   The Taut catheter was then removed.  The cystic duct was triply endoclipped.  The was a cystic artery identified in the triangle of Calot.  That was  triply endoclipped and divided.  The gallbladder was then sharply and  bluntly dissected from the gallbladder bed.  Prior to complete division of  the gallbladder from the gallbladder bed, I reinspected the triangle of  Calot.  There was no bleeding or bile leak.  I reinspected the gallbladder  bed.  There was no bleeding or bile leak.  I then placed the gallbladder in  an EndoCatch bag and delivered it through the umbilicus.  I then irrigated  the abdomen out with about 2.5 L of saline.  I did put some Surgicel in the  liver bed because it was raw, but there was no active bleeding.   The patient tolerated the procedure well.  The sponge and needle counts were  correct at the end of  the case.  The umbilical port was closed with 0 Vicryl  suture.  All trocars were removed under direct visualization.  I did have to  switch to a 30-degree scope to get the gallbladder out, just to look over  the duodenum, but otherwise the procedure went well.  The skin ports were  closed with a 5-0 Monocryl suture and painted with tincture of benzoin and  Steri-Strips.   She was transported to the recovery room in good condition.      Fenton Malling. Lucia Gaskins, M.D.  Electronically Signed     DHN/MEDQ  D:  03/31/2005  T:  04/01/2005  Job:  [id number]   cc:   Hennessey A. Walker Kehr, M.D.  Fax: [phone]   Windy Kalata, M.D.  Fax: [phone]

## 2010-10-03 LAB — DIFFERENTIAL
Basophils Absolute: 0
Basophils Absolute: 0.1
Basophils Relative: 0
Lymphs Abs: 2.3
Monocytes Absolute: 0.4
Monocytes Absolute: 0.5
Neutro Abs: 6.1
Neutrophils Relative %: 78 — ABNORMAL HIGH

## 2010-10-03 LAB — BASIC METABOLIC PANEL
Chloride: 104
GFR calc non Af Amer: >60
Sodium: 141

## 2010-10-03 LAB — CBC
HCT: 34.2 — ABNORMAL LOW
MCHC: 35.1
MCV: 77.5 — ABNORMAL LOW
Platelets: 227
RBC: 4.41
RBC: 5.21 — ABNORMAL HIGH
RDW: 14.4

## 2010-10-23 DIAGNOSIS — B0229 Other postherpetic nervous system involvement: Secondary | ICD-10-CM | POA: Insufficient documentation

## 2010-10-23 HISTORY — DX: Other postherpetic nervous system involvement: B02.29

## 2011-01-22 DIAGNOSIS — Z9884 Bariatric surgery status: Secondary | ICD-10-CM | POA: Insufficient documentation

## 2011-08-14 HISTORY — PX: ROUX-EN-Y GASTRIC BYPASS: SHX1104

## 2012-06-11 ENCOUNTER — Encounter: Payer: Self-pay | Admitting: Gynecology

## 2012-06-11 ENCOUNTER — Other Ambulatory Visit: Payer: Self-pay | Admitting: Gynecology

## 2012-06-11 DIAGNOSIS — Z9884 Bariatric surgery status: Secondary | ICD-10-CM

## 2012-06-11 DIAGNOSIS — K909 Intestinal malabsorption, unspecified: Secondary | ICD-10-CM

## 2012-06-15 ENCOUNTER — Telehealth (INDEPENDENT_AMBULATORY_CARE_PROVIDER_SITE_OTHER): Payer: Self-pay | Admitting: Surgery

## 2012-06-15 NOTE — Telephone Encounter (Signed)
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 The records were placed in Dr Pollie Friar box for review.

## 2012-06-16 ENCOUNTER — Other Ambulatory Visit (INDEPENDENT_AMBULATORY_CARE_PROVIDER_SITE_OTHER): Payer: 59

## 2012-06-16 DIAGNOSIS — Z0000 Encounter for general adult medical examination without abnormal findings: Secondary | ICD-10-CM

## 2012-06-16 DIAGNOSIS — Z9884 Bariatric surgery status: Secondary | ICD-10-CM

## 2012-06-16 LAB — MAGNESIUM: Magnesium: 1.6 mg/dL (ref 1.5–2.5)

## 2012-06-16 LAB — VITAMIN B12: Vitamin B-12: 744 pg/mL (ref 211–911)

## 2012-06-16 LAB — PHOSPHORUS: Phosphorus: 4.4 mg/dL (ref 2.3–4.6)

## 2012-06-16 LAB — COMPREHENSIVE METABOLIC PANEL
ALT: 46 U/L — ABNORMAL HIGH (ref 0–35)
AST: 45 U/L — ABNORMAL HIGH (ref 0–37)
BUN: 13 mg/dL (ref 6–23)
CO2: 25 mEq/L (ref 19–32)
Calcium: 8.6 mg/dL (ref 8.4–10.5)
Chloride: 103 mEq/L (ref 96–112)
Creat: 0.68 mg/dL (ref 0.50–1.10)
Total Bilirubin: 0.6 mg/dL (ref 0.3–1.2)

## 2012-06-16 LAB — CBC
HCT: 38.9 % (ref 36.0–46.0)
MCH: 26.3 pg (ref 26.0–34.0)
MCV: 76.9 fL — ABNORMAL LOW (ref 78.0–100.0)
Platelets: 210 10*3/uL (ref 150–400)
RDW: 15.7 % — ABNORMAL HIGH (ref 11.5–15.5)
WBC: 5.6 10*3/uL (ref 4.0–10.5)

## 2012-06-17 LAB — PROTEIN, TOTAL: Total Protein: 6.8 g/dL (ref 6.0–8.3)

## 2012-06-17 LAB — LIPID PANEL
HDL: 63 mg/dL (ref >39)
LDL Cholesterol: 56 mg/dL (ref 0–99)
Total CHOL/HDL Ratio: 2.1 Ratio

## 2012-06-17 LAB — HEPATIC FUNCTION PANEL
AST: 43 U/L — ABNORMAL HIGH (ref 0–37)
Alkaline Phosphatase: 105 U/L (ref 39–117)
Indirect Bilirubin: 0.5 mg/dL (ref 0.0–0.9)
Total Bilirubin: 0.6 mg/dL (ref 0.3–1.2)

## 2012-06-17 LAB — PREALBUMIN: Prealbumin: 17.7 mg/dL (ref 17.0–34.0)

## 2012-06-19 LAB — ZINC: Zinc: 64 ug/dL (ref 60–130)

## 2012-06-20 LAB — DHEA: DHEA: 80 ng/dL — ABNORMAL LOW (ref 102–1185)

## 2012-06-20 LAB — VITAMIN E: Gamma-Tocopherol (Vit E): 0.6 mg/L (ref <=4.3)

## 2012-06-21 LAB — VITAMIN D 1,25 DIHYDROXY
Vitamin D 1, 25 (OH)2 Total: 78 pg/mL — ABNORMAL HIGH (ref 18–72)
Vitamin D2 1, 25 (OH)2: 19 pg/mL

## 2012-06-24 ENCOUNTER — Encounter: Payer: Self-pay | Admitting: Gynecology

## 2012-06-24 ENCOUNTER — Ambulatory Visit (INDEPENDENT_AMBULATORY_CARE_PROVIDER_SITE_OTHER): Payer: 59 | Admitting: Gynecology

## 2012-06-24 VITALS — BP 130/76 | HR 70 | Resp 16 | Ht 60.75 in | Wt 181.0 lb

## 2012-06-24 DIAGNOSIS — Z01419 Encounter for gynecological examination (general) (routine) without abnormal findings: Secondary | ICD-10-CM

## 2012-06-24 DIAGNOSIS — Z9884 Bariatric surgery status: Secondary | ICD-10-CM

## 2012-06-24 DIAGNOSIS — A630 Anogenital (venereal) warts: Secondary | ICD-10-CM

## 2012-06-24 DIAGNOSIS — H903 Sensorineural hearing loss, bilateral: Secondary | ICD-10-CM

## 2012-06-24 DIAGNOSIS — Z9621 Cochlear implant status: Secondary | ICD-10-CM

## 2012-06-24 DIAGNOSIS — Z9889 Other specified postprocedural states: Secondary | ICD-10-CM

## 2012-06-24 NOTE — Progress Notes (Signed)
62 y.o. married Caucasian female   G0P0 here for annual exam.   She does not report hot flashes, does not have night sweats, does have vaginal dryness.  She reports a remote history of genital warts, treated with surgical excision and aldara, got smaller but never biopsied.  Pt reports that area has gotten perceptively bigger as pt has been loosing weight with diet and bypass surgery.  No bleeding.    No LMP recorded.          Sexually active: yes  The current method of family planning is status post hysterectomy.    Exercising: yes  Gym/ health club routine includes spinning. Last pap:  2007 Abnormal PAP: Mammogram: 2013 BSE: no Colonoscopy: 2010 DEXA: no Alcohol: no  Tobacco: no  Health Maintenance  Topic Date Due  . Mammogram  11/16/2002  . Colonoscopy  11/16/2002  . Pap Smear  02/14/2003  . Tetanus/tdap  02/13/2005  . Influenza Vaccine  09/13/2012    Family History  Problem Relation Age of Onset  . Heart attack Mother   . Heart disease Mother   . Cancer Father     unknown primary  . Diabetes Maternal Aunt   . Heart disease Maternal Aunt   . Cancer Paternal Aunt   . Cancer Paternal Uncle     There are no active problems to display for this patient.   Past Medical History  Diagnosis Date  . Hearing loss sensory, bilateral   . Infertility, female   . Genital warts   . Fibroid     Past Surgical History  Procedure Laterality Date  . Vaginal hysterectomy  2007    fibroids  . Roux-en-y gastric bypass  2013  . Laparoscopic gastric banding  2009  . Strabismus surgery Right   . Cholecystectomy, laparoscopic  2007  . Cochlear implant      Allergies: Review of patient's allergies indicates no known allergies.  Current Outpatient Prescriptions  Medication Sig Dispense Refill  . calcium-vitamin D (OSCAL WITH D) 250-125 MG-UNIT per tablet Take 1 tablet by mouth daily.      . citalopram (CELEXA) 10 MG tablet Take 10 mg by mouth daily.      . clonazePAM (KLONOPIN)  0.25 MG disintegrating tablet Take 0.25 mg by mouth 2 (two) times daily as needed.      . cyanocobalamin 2000 MCG tablet Take 2,000 mcg by mouth daily.      . ferrous sulfate 325 (65 FE) MG tablet Take 325 mg by mouth daily with breakfast.      . multivitamin-iron-minerals-folic acid (THERAPEUTIC-M) TABS tablet Take 1 tablet by mouth daily.      Marland Kitchen zolpidem (AMBIEN) 10 MG tablet Take 10 mg by mouth at bedtime as needed for sleep.       No current facility-administered medications for this visit.    ROS: Pertinent items are noted in HPI.  Exam:    There were no vitals taken for this visit. Weight change: @WEIGHTCHANGE @ Last 3 height recordings:  Ht Readings from Last 3 Encounters:  No data found for Ht   General appearance: alert, cooperative and appears stated age Head: Normocephalic, without obvious abnormality, atraumatic Neck: no adenopathy, no carotid bruit, no JVD, supple, symmetrical, trachea midline and thyroid not enlarged, symmetric, no tenderness/mass/nodules Lungs: clear to auscultation bilaterally Breasts: normal appearance, no masses or tenderness Heart: regular rate and rhythm, S1, S2 normal, no murmur, click, rub or gallop Abdomen: soft, non-tender; bowel sounds normal; no masses,  no  organomegaly Extremities: extremities normal, atraumatic, no cyanosis or edema Skin: perirectal verrucus like wart, see picture, no area of necrosis Lymph nodes: Cervical, supraclavicular, and axillary nodes normal. no inguinal nodes palpated Neurologic: Grossly normal   Pelvic: External genitalia:  no lesions              Urethra: normal appearing urethra with no masses, tenderness or lesions              Bartholins and Skenes: normal                 Vagina: decreased rugae, no petechiae              Cervix: absent              Pap taken: no        Bimanual Exam:  Uterus:  absent                                      Adnexa:    no masses                                       Rectovaginal: Confirms                                      Anus:  normal sphincter tone, no lesions     A: well woman Anogenital wart      P: mammogram return annually or prn Anogenital wart increasing in size by history-pt informed too large to excise in office, pt shown area of concern. recommend a biopsy to confirm benign wart as has rapid growth, and if benign, recommend removal in OR.  Risks and benefits discussed, consent signed.  Area cleansed with betadine, injected with 2%lidocaine/0.25%marcaine and a punch biopsy was obtained according to picture.  Base excised sharply,, base treated with monsels for hemostasis. Pt tolerated well, tissue to dermopathology. Discussed local excision vs CO2 vaproization with pt and spouse, post-operative courses reviewed, pt prefers laser, aware no further pathology will be gotten, will contact with results. Pt to f/u with general surgery after Bypass-new MD  An After Visit Summary was printed and given to the patient.

## 2012-06-25 DIAGNOSIS — H903 Sensorineural hearing loss, bilateral: Secondary | ICD-10-CM | POA: Insufficient documentation

## 2012-06-25 DIAGNOSIS — Z9621 Cochlear implant status: Secondary | ICD-10-CM | POA: Insufficient documentation

## 2012-06-25 DIAGNOSIS — Z9884 Bariatric surgery status: Secondary | ICD-10-CM | POA: Insufficient documentation

## 2012-06-28 ENCOUNTER — Other Ambulatory Visit: Payer: Self-pay | Admitting: *Deleted

## 2012-06-28 DIAGNOSIS — K629 Disease of anus and rectum, unspecified: Secondary | ICD-10-CM

## 2012-07-12 ENCOUNTER — Ambulatory Visit (INDEPENDENT_AMBULATORY_CARE_PROVIDER_SITE_OTHER): Payer: Commercial Managed Care - PPO | Admitting: General Surgery

## 2012-07-12 ENCOUNTER — Encounter (INDEPENDENT_AMBULATORY_CARE_PROVIDER_SITE_OTHER): Payer: Self-pay | Admitting: General Surgery

## 2012-07-12 VITALS — BP 122/70 | HR 68 | Resp 16 | Ht 61.5 in | Wt 182.4 lb

## 2012-07-12 DIAGNOSIS — A630 Anogenital (venereal) warts: Secondary | ICD-10-CM | POA: Insufficient documentation

## 2012-07-12 NOTE — Progress Notes (Signed)
Chief Complaint  Patient presents with  . Other    Anal Condyloma    HISTORY: Denise Rice is a 62 y.o. female who presents to the office with a perianal mass.  This was biopsied by Dr Charlies Constable, and found to have some mild dysplasia.  She is here today for anal evaluation.  She had this area removed about 8 years ago, but suddenly started growing again recently.  She denies any bleeding or pain.  She denies any other masses/condyloma elsewhere.  Past Medical History  Diagnosis Date  . Hearing loss sensory, bilateral   . Infertility, female   . Genital warts   . Fibroid       Past Surgical History  Procedure Laterality Date  . Vaginal hysterectomy  2007    fibroids  . Roux-en-y gastric bypass  2013  . Laparoscopic gastric banding  2009  . Strabismus surgery Right   . Cholecystectomy, laparoscopic  2007  . Cochlear implant    . Cholecystectomy          Current Outpatient Prescriptions  Medication Sig Dispense Refill  . calcium-vitamin D (OSCAL WITH D) 250-125 MG-UNIT per tablet Take 1 tablet by mouth daily.      . citalopram (CELEXA) 10 MG tablet Take 10 mg by mouth daily.      . clonazePAM (KLONOPIN) 0.25 MG disintegrating tablet Take 0.25 mg by mouth 2 (two) times daily as needed.      . cyanocobalamin 2000 MCG tablet Take 2,000 mcg by mouth daily.      . ferrous sulfate 325 (65 FE) MG tablet Take 325 mg by mouth daily with breakfast.      . multivitamin-iron-minerals-folic acid (THERAPEUTIC-M) TABS tablet Take 1 tablet by mouth daily.      Marland Kitchen zolpidem (AMBIEN) 10 MG tablet Take 10 mg by mouth at bedtime as needed for sleep.       No current facility-administered medications for this visit.      No Known Allergies    Family History  Problem Relation Age of Onset  . Heart attack Mother   . Heart disease Mother   . Cancer Father     unknown primary  . Diabetes Maternal Aunt   . Heart disease Maternal Aunt   . Cancer Paternal Aunt   . Cancer Paternal Uncle      History   Social History  . Marital Status: Married    Spouse Name: N/A    Number of Children: N/A  . Years of Education: N/A   Social History Main Topics  . Smoking status: Never Smoker   . Smokeless tobacco: None  . Alcohol Use: No  . Drug Use: No  . Sexually Active: Yes    Birth Control/ Protection: Post-menopausal   Other Topics Concern  . None   Social History Narrative  . None      REVIEW OF SYSTEMS - PERTINENT POSITIVES ONLY: Review of Systems - General ROS: negative for - chills, fever or weight loss Hematological and Lymphatic ROS: negative for - bleeding problems, blood clots or bruising Respiratory ROS: no cough, shortness of breath, or wheezing Cardiovascular ROS: no chest pain or dyspnea on exertion Gastrointestinal ROS: no abdominal pain, change in bowel habits, or black or bloody stools Genito-Urinary ROS: no dysuria, trouble voiding, or hematuria  EXAM: Filed Vitals:   07/12/12 1445  BP: 122/70  Pulse: 68  Resp: 16    General appearance: alert and cooperative Resp: clear to auscultation bilaterally Cardio:  regular rate and rhythm GI: normal findings: soft, non-tender   Procedure: Anoscopy Surgeon: Marcello Moores Diagnosis: condyloma  Assistant: Alphonzo Severance After the risks and benefits were explained, verbal consent was obtained for above procedure  Anesthesia: none Findings: L lateral perinal condyloma focus in a circular pattern, no internal lesions palpated or visualized   ASSESSMENT AND PLAN: 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  A biopsy did show some dysplasia.  On anoscopic exam she does not have any internal lesions.  I have recommended that she undergo laser ablation.  We discussed the management of anal warts. We discussed chemical destruction, immunotherapy, and surgical excision. I discussed the pros and cons of each approach. We discussed the risk and benefits and the expected outcome with chemical destruction  with agents such as podophyllin. I explained that podophyllin is generally not been effective and has a high recurrence rate. We discussed the use of Aldara ointment. I explained that it has a 30-70% chance at resolving or at least reducing the number of anal warts. I explained that it is applied 3 times a week at night and left on overnight. I explained that skin irritation is the most common side effect. We then discussed surgical excision specifically excision and fulguration. I explained how the surgery is performed. I explained that it can be painful however it generally has the highest success rate. We discussed the risk and benefits of surgery including but not limited to bleeding, infection, injury to surrounding structures, need to do a formal anoscopic exam to evaluate for anal canal warts, urinary retention, wart recurrence, and general anesthesia risk. We discussed the typical aftercare.       [name], MD Colon and Rectal Surgery / [facility]      Visit Diagnoses: 1. Condyloma     Primary Care Physician: Rachell Cipro, MD

## 2012-07-12 NOTE — Patient Instructions (Signed)
Anal Warts  What are anal warts? Anal warts (also called "condyloma acuminata") are a condition that affects the area around and inside the anus. They may also affect the skin of the genital area. They first appear as tiny spots or growths, perhaps as small as the head of a pin, and may grow quite large and cover the entire anal area. Usually, they do not cause pain or discomfort to afflicted individuals and patients may be unaware that the warts are present. Some patients will experience symptoms, such as itching, bleeding, mucus discharge and/or a feeling of a lump or mass in the anal area.  What causes anal warts? They are caused by the human papilloma virus (HPV), which is transmitted from person to person by direct contact. HPV is considered a sexually transmitted disease (STD). You do not have to have anal intercourse to develop anal warts. Do anal warts always need to be removed? Yes. If they are not removed, the warts usually grow larger and multiply. Left untreated, the warts may lead to an increased risk of cancer in the affected area. What treatments are available? If warts are very small and are located only on the skin around the anus, they may be treated with a topical medication. They may also be treated by freezing the warts with liquid nitrogen or removed surgically. Surgery typically involves cutting or burning the warts off. While this provides immediate results, it must be performed using either a local anesthetic - such as novocaine - or a general or spinal anesthetic, depending on the number and exact location of warts being treated. It is important that an internal anal examination with an instrument called an anoscope be done by your treating physician to ensure you do not have any inside the anal canal (internal anal warts). Internal anal warts may not be as suitable for treatment by topical medications, and may need to be treated surgically. Additionally, your physician may wish to  examine the entire pelvic region to include the vaginal or penile area to look for other warts that may require treatment. Must I be hospitalized for surgical treatment? Surgical treatment of anal warts is usually performed as outpatient surgery. How much time will I lose from work after surgical treatment? Most people are moderately uncomfortable for a few days after treatment and pain medication may be prescribed. Depending on the extent of the disease, some people return to work the next day, while others may remain out of work for several days to weeks. Will a single treatment cure the problem? When warts are extensive, your surgeon may wish to perform the surgery in stages. Additionally, recurrent warts are common. The virus that causes the warts can live concealed in tissues that appear normal for several months before another wart develops. As new warts develop, they usually can be treated in the physician's office. Sometimes new warts develop so rapidly that office treatment would be quite uncomfortable. In these situations, a second and, occasionally, third outpatient surgical visit may be recommended. How long is treatment usually continued? Follow-up visits are necessary at frequent intervals for several months after all warts appear to be gone, to be certain that no new warts occur. What can be done to avoid getting these warts again? In some cases, warts may recur repeatedly after successful removal, since the virus that causes the warts often persists in a dormant state in body tissues. Discuss with your physician how often you should be evaluated for recurrent warts. Abstain from sexual   contact with individuals who have anal (or genital) warts. Since many individuals may be unaware that they suffer from this condition, sexual abstinence, condom protection or limiting sexual contact to single partner will reduce your potential exposure to the contagious virus that causes these warts. As a  precaution, sexual partners ought to be checked for warts and other sexual transmitted diseases, even if they have no symptoms. What is a colon and rectal surgeon? Colon and rectal surgeons are experts in the surgical and non-surgical treatment of diseases of the colon, rectum and anus. They have completed advanced surgical training in the treatment of these diseases as well as full general surgical training. Board-certified colon and rectal surgeons complete residencies in general surgery and colon and rectal surgery, and pass intensive examinations conducted by the American Board of Surgery and the American Board of Colon and Rectal Surgery. They are well-versed in the treatment of both benign and malignant diseases of the colon, rectum and anus and are able to perform routine screening examinations and surgically treat conditions if indicated to do so. author: Jennifer Lowney, MD, FASCRS, on behalf of the ASCRS Public Relations Committee  2012 American Society of Colon & Rectal Surgeons   

## 2012-07-14 ENCOUNTER — Other Ambulatory Visit: Payer: Self-pay | Admitting: Gynecology

## 2012-07-14 NOTE — Telephone Encounter (Signed)
Please advise pt was here for AEX 06/24/12 no rx was given.

## 2012-07-23 ENCOUNTER — Encounter (HOSPITAL_BASED_OUTPATIENT_CLINIC_OR_DEPARTMENT_OTHER): Payer: Self-pay | Admitting: *Deleted

## 2012-07-23 NOTE — Progress Notes (Signed)
NPO AFTER MN WITH EXCEPTION CLEAR LIQUIDS UNTIL 0730 (NO CREAM/ MILK PRODUCTS). ARRIVES AT 1215. NEEDS HG. WILL TAKE CELEXA AM OF SURG W/ SIP OF WATER.

## 2012-07-28 ENCOUNTER — Encounter (INDEPENDENT_AMBULATORY_CARE_PROVIDER_SITE_OTHER): Payer: Self-pay | Admitting: General Surgery

## 2012-07-30 ENCOUNTER — Encounter (HOSPITAL_BASED_OUTPATIENT_CLINIC_OR_DEPARTMENT_OTHER)
Admission: RE | Disposition: A | Discharge status: Home / Self Care | Payer: Self-pay | Source: Ambulatory Visit | Attending: General Surgery

## 2012-07-30 ENCOUNTER — Encounter (HOSPITAL_BASED_OUTPATIENT_CLINIC_OR_DEPARTMENT_OTHER): Payer: Self-pay | Admitting: Anesthesiology

## 2012-07-30 ENCOUNTER — Ambulatory Visit (HOSPITAL_BASED_OUTPATIENT_CLINIC_OR_DEPARTMENT_OTHER)
Admission: RE | Admit: 2012-07-30 | Discharge: 2012-07-30 | Disposition: A | Discharge status: Home / Self Care | Payer: 59 | Source: Ambulatory Visit | Attending: General Surgery | Admitting: General Surgery

## 2012-07-30 ENCOUNTER — Encounter (HOSPITAL_BASED_OUTPATIENT_CLINIC_OR_DEPARTMENT_OTHER): Payer: Self-pay | Admitting: *Deleted

## 2012-07-30 ENCOUNTER — Ambulatory Visit (HOSPITAL_BASED_OUTPATIENT_CLINIC_OR_DEPARTMENT_OTHER): Payer: 59 | Admitting: Anesthesiology

## 2012-07-30 DIAGNOSIS — A630 Anogenital (venereal) warts: Secondary | ICD-10-CM | POA: Insufficient documentation

## 2012-07-30 DIAGNOSIS — Z79899 Other long term (current) drug therapy: Secondary | ICD-10-CM | POA: Insufficient documentation

## 2012-07-30 HISTORY — DX: Presence of external hearing-aid: Z97.4

## 2012-07-30 HISTORY — DX: Other specified postprocedural states: Z98.890

## 2012-07-30 HISTORY — DX: Anogenital (venereal) warts: A63.0

## 2012-07-30 HISTORY — DX: Polycystic ovarian syndrome: E28.2

## 2012-07-30 HISTORY — DX: Personal history of peptic ulcer disease: Z87.11

## 2012-07-30 HISTORY — DX: Unspecified otosclerosis, left ear: H80.92

## 2012-07-30 HISTORY — PX: LASER ABLATION CONDOLAMATA: SHX5941

## 2012-07-30 HISTORY — DX: Other specified postprocedural states: R11.2

## 2012-07-30 HISTORY — DX: Other specified diseases of inner ear, bilateral: H83.8X3

## 2012-07-30 HISTORY — DX: Unspecified sensorineural hearing loss: H90.5

## 2012-07-30 HISTORY — DX: Unspecified hearing loss, unspecified ear: H91.90

## 2012-07-30 HISTORY — DX: Unspecified hearing loss, left ear: H91.92

## 2012-07-30 SURGERY — ABLATION, CONDYLOMA, USING LASER
Anesthesia: Monitor Anesthesia Care | Site: Rectum | Wound class: Clean Contaminated

## 2012-07-30 MED ORDER — OXYCODONE HCL 5 MG PO TABS
5.0000 mg | ORAL_TABLET | ORAL | Status: DC | PRN
  Filled 2012-07-30: qty 2

## 2012-07-30 MED ORDER — BUPIVACAINE-EPINEPHRINE 0.25% -1:200000 IJ SOLN
INTRAMUSCULAR | Status: DC | PRN
  Administered 2012-07-30: 20 mL

## 2012-07-30 MED ORDER — ACETAMINOPHEN 10 MG/ML IV SOLN
1000.0000 mg | Freq: Once | INTRAVENOUS | Status: DC | PRN
  Filled 2012-07-30: qty 100

## 2012-07-30 MED ORDER — SCOPOLAMINE 1 MG/3DAYS TD PT72
1.0000 | MEDICATED_PATCH | TRANSDERMAL | Status: DC
  Administered 2012-07-30: 1.5 mg via TRANSDERMAL
  Filled 2012-07-30: qty 1

## 2012-07-30 MED ORDER — DEXAMETHASONE SODIUM PHOSPHATE 4 MG/ML IJ SOLN
INTRAMUSCULAR | Status: DC | PRN
  Administered 2012-07-30: 4 mg via INTRAVENOUS

## 2012-07-30 MED ORDER — ACETAMINOPHEN 650 MG RE SUPP
650.0000 mg | RECTAL | Status: DC | PRN
  Filled 2012-07-30: qty 1

## 2012-07-30 MED ORDER — DOCUSATE SODIUM 100 MG PO CAPS: 100.0000 mg | ORAL_CAPSULE | Freq: Two times a day (BID) | ORAL | Status: DC

## 2012-07-30 MED ORDER — OXYCODONE HCL 5 MG/5ML PO SOLN
5.0000 mg | Freq: Once | ORAL | Status: DC | PRN
  Filled 2012-07-30: qty 5

## 2012-07-30 MED ORDER — ACETIC ACID 5 % SOLN
Status: DC | PRN
  Administered 2012-07-30: 1 via TOPICAL

## 2012-07-30 MED ORDER — OXYCODONE HCL 5 MG PO TABS: 5.0000 mg | ORAL_TABLET | ORAL | Status: DC | PRN

## 2012-07-30 MED ORDER — OXYCODONE HCL 5 MG PO TABS
5.0000 mg | ORAL_TABLET | Freq: Once | ORAL | Status: DC | PRN
  Filled 2012-07-30: qty 1

## 2012-07-30 MED ORDER — SODIUM CHLORIDE 0.9 % IJ SOLN
3.0000 mL | INTRAMUSCULAR | Status: DC | PRN
  Filled 2012-07-30: qty 3

## 2012-07-30 MED ORDER — HYDROMORPHONE HCL PF 1 MG/ML IJ SOLN
0.2500 mg | INTRAMUSCULAR | Status: DC | PRN
  Filled 2012-07-30: qty 1

## 2012-07-30 MED ORDER — SODIUM CHLORIDE 0.9 % IJ SOLN
3.0000 mL | Freq: Two times a day (BID) | INTRAMUSCULAR | Status: DC
  Filled 2012-07-30: qty 3

## 2012-07-30 MED ORDER — SILVER SULFADIAZINE 1 % EX CREA
TOPICAL_CREAM | CUTANEOUS | Status: DC | PRN
  Administered 2012-07-30: 1 via TOPICAL

## 2012-07-30 MED ORDER — PROMETHAZINE HCL 25 MG/ML IJ SOLN
6.2500 mg | INTRAMUSCULAR | Status: DC | PRN
  Filled 2012-07-30: qty 1

## 2012-07-30 MED ORDER — MEPERIDINE HCL 25 MG/ML IJ SOLN
6.2500 mg | INTRAMUSCULAR | Status: DC | PRN
  Filled 2012-07-30: qty 1

## 2012-07-30 MED ORDER — ONDANSETRON HCL 4 MG/2ML IJ SOLN
4.0000 mg | Freq: Four times a day (QID) | INTRAMUSCULAR | Status: DC | PRN
  Filled 2012-07-30: qty 2

## 2012-07-30 MED ORDER — LIDOCAINE HCL (CARDIAC) 20 MG/ML IV SOLN
INTRAVENOUS | Status: DC | PRN
  Administered 2012-07-30: 50 mg via INTRAVENOUS

## 2012-07-30 MED ORDER — ONDANSETRON HCL 4 MG/2ML IJ SOLN
INTRAMUSCULAR | Status: DC | PRN
  Administered 2012-07-30: 4 mg via INTRAVENOUS

## 2012-07-30 MED ORDER — SILVER SULFADIAZINE 1 % EX CREA: TOPICAL_CREAM | Freq: Every day | CUTANEOUS | Status: DC

## 2012-07-30 MED ORDER — MIDAZOLAM HCL 5 MG/5ML IJ SOLN
INTRAMUSCULAR | Status: DC | PRN
  Administered 2012-07-30: 1 mg via INTRAVENOUS
  Administered 2012-07-30: 2 mg via INTRAVENOUS

## 2012-07-30 MED ORDER — LIDOCAINE 5 % EX OINT: TOPICAL_OINTMENT | CUTANEOUS | Status: DC | PRN

## 2012-07-30 MED ORDER — LACTATED RINGERS IV SOLN
INTRAVENOUS | Status: DC
  Administered 2012-07-30 (×2): via INTRAVENOUS
  Filled 2012-07-30: qty 1000

## 2012-07-30 MED ORDER — SODIUM CHLORIDE 0.9 % IV SOLN
250.0000 mL | INTRAVENOUS | Status: DC | PRN
  Filled 2012-07-30: qty 250

## 2012-07-30 MED ORDER — PROPOFOL 10 MG/ML IV EMUL
INTRAVENOUS | Status: DC | PRN
  Administered 2012-07-30: 75 ug/kg/min via INTRAVENOUS

## 2012-07-30 MED ORDER — FENTANYL CITRATE 0.05 MG/ML IJ SOLN
INTRAMUSCULAR | Status: DC | PRN
  Administered 2012-07-30: 50 ug via INTRAVENOUS
  Administered 2012-07-30: 25 ug via INTRAVENOUS

## 2012-07-30 MED ORDER — ACETAMINOPHEN 325 MG PO TABS
650.0000 mg | ORAL_TABLET | ORAL | Status: DC | PRN
  Filled 2012-07-30: qty 2

## 2012-07-30 SURGICAL SUPPLY — 47 items
BLADE HEX COATED 2.75 (ELECTRODE) ×2 IMPLANT
BLADE SURG 15 STRL LF DISP TIS (BLADE) ×1 IMPLANT
BLADE SURG 15 STRL SS (BLADE) ×2
BRIEF STRETCH FOR OB PAD LRG (UNDERPADS AND DIAPERS) ×2 IMPLANT
CANISTER SUCTION 2500CC (MISCELLANEOUS) ×2 IMPLANT
CLOTH BEACON ORANGE TIMEOUT ST (SAFETY) ×2 IMPLANT
COVER MAYO STAND STRL (DRAPES) IMPLANT
COVER TABLE BACK 60X90 (DRAPES) ×2 IMPLANT
DECANTER SPIKE VIAL GLASS SM (MISCELLANEOUS) ×2 IMPLANT
DRAPE LG THREE QUARTER DISP (DRAPES) ×4 IMPLANT
DRAPE PED LAPAROTOMY (DRAPES) ×2 IMPLANT
DRAPE UNDERBUTTOCKS STRL (DRAPE) IMPLANT
DRSG PAD ABDOMINAL 8X10 ST (GAUZE/BANDAGES/DRESSINGS) ×1 IMPLANT
ELECT REM PT RETURN 9FT ADLT (ELECTROSURGICAL) ×2
ELECTRODE REM PT RTRN 9FT ADLT (ELECTROSURGICAL) ×1 IMPLANT
GAUZE SPONGE 4X4 12PLY STRL LF (GAUZE/BANDAGES/DRESSINGS) ×1 IMPLANT
GAUZE SPONGE 4X4 16PLY XRAY LF (GAUZE/BANDAGES/DRESSINGS) IMPLANT
GAUZE VASELINE 3X9 (GAUZE/BANDAGES/DRESSINGS) IMPLANT
GLOVE BIO SURGEON STRL SZ 6.5 (GLOVE) ×4 IMPLANT
GLOVE BIOGEL PI IND STRL 7.0 (GLOVE) ×2 IMPLANT
GLOVE BIOGEL PI INDICATOR 7.0 (GLOVE) ×2
GOWN PREVENTION PLUS LG XLONG (DISPOSABLE) ×2 IMPLANT
GOWN PREVENTION PLUS XLARGE (GOWN DISPOSABLE) ×1 IMPLANT
GOWN PREVENTION PLUS XXLARGE (GOWN DISPOSABLE) ×1 IMPLANT
GOWN STRL REIN XL XLG (GOWN DISPOSABLE) ×1 IMPLANT
LEGGING LITHOTOMY PAIR STRL (DRAPES) IMPLANT
NDL HYPO 25X1 1.5 SAFETY (NEEDLE) ×1 IMPLANT
NDL SAFETY ECLIPSE 18X1.5 (NEEDLE) IMPLANT
NEEDLE HYPO 18GX1.5 SHARP (NEEDLE)
NEEDLE HYPO 25X1 1.5 SAFETY (NEEDLE) ×2 IMPLANT
NS IRRIG 500ML POUR BTL (IV SOLUTION) ×2 IMPLANT
PACK BASIN DAY SURGERY FS (CUSTOM PROCEDURE TRAY) ×2 IMPLANT
PENCIL BUTTON HOLSTER BLD 10FT (ELECTRODE) ×2 IMPLANT
SPONGE GAUZE 4X4 12PLY (GAUZE/BANDAGES/DRESSINGS) IMPLANT
SPONGE SURGIFOAM ABS GEL 12-7 (HEMOSTASIS) IMPLANT
SUT CHROMIC 2 0 SH (SUTURE) IMPLANT
SUT CHROMIC 3 0 SH 27 (SUTURE) ×1 IMPLANT
SUT MON AB 3-0 SH 27 (SUTURE)
SUT MON AB 3-0 SH27 (SUTURE) IMPLANT
SUT VIC AB 4-0 P-3 18XBRD (SUTURE) IMPLANT
SUT VIC AB 4-0 P3 18 (SUTURE)
SYR CONTROL 10ML LL (SYRINGE) ×2 IMPLANT
TOWEL OR 17X24 6PK STRL BLUE (TOWEL DISPOSABLE) ×2 IMPLANT
TRAY DSU PREP LF (CUSTOM PROCEDURE TRAY) ×2 IMPLANT
TUBE CONNECTING 12X1/4 (SUCTIONS) ×2 IMPLANT
VACUUM HOSE 7/8X10 W/ WAND (MISCELLANEOUS) ×1 IMPLANT
YANKAUER SUCT BULB TIP NO VENT (SUCTIONS) ×1 IMPLANT

## 2012-07-30 NOTE — Anesthesia Postprocedure Evaluation (Signed)
Anesthesia Post Note  Patient: Denise Rice  Procedure(s) Performed: Procedure(s) (LRB): LASER ABLATION CONDOLAMATA (N/A)  Anesthesia type: MAC  Patient location: PACU  Post pain: Pain level controlled  Post assessment: Post-op Vital signs reviewed  Last Vitals: BP 119/60  Pulse 58  Temp(Src) 37.3 C (Oral)  Resp 12  Ht 5\' 1"  (1.549 m)  Wt 177 lb (80.287 kg)  BMI 33.46 kg/m2  SpO2 97%  Post vital signs: Reviewed  Level of consciousness: awake  Complications: No apparent anesthesia complications

## 2012-07-30 NOTE — Transfer of Care (Signed)
Immediate Anesthesia Transfer of Care Note  Patient: Denise Rice  Procedure(s) Performed: Procedure(s) (LRB): LASER ABLATION CONDOLAMATA (N/A)  Patient Location:MAC  Anesthesia Type: General  Level of Consciousness: awake, alert  and oriented  Airway & Oxygen Therapy: Patient Spontanous Breathing and Patient connected to face mask oxygen  Post-op Assessment: Report given to PACU RN and Post -op Vital signs reviewed and stable  Post vital signs: Reviewed and stable  Complications: No apparent anesthesia complications

## 2012-07-30 NOTE — H&P (View-Only) (Signed)
Chief Complaint  Patient presents with  . Other    Anal Condyloma    HISTORY: Denise Rice is a 62 y.o. female who presents to the office with a perianal mass.  This was biopsied by Dr Charlies Constable, and found to have some mild dysplasia.  She is here today for anal evaluation.  She had this area removed about 8 years ago, but suddenly started growing again recently.  She denies any bleeding or pain.  She denies any other masses/condyloma elsewhere.  Past Medical History  Diagnosis Date  . Hearing loss sensory, bilateral   . Infertility, female   . Genital warts   . Fibroid       Past Surgical History  Procedure Laterality Date  . Vaginal hysterectomy  2007    fibroids  . Roux-en-y gastric bypass  2013  . Laparoscopic gastric banding  2009  . Strabismus surgery Right   . Cholecystectomy, laparoscopic  2007  . Cochlear implant    . Cholecystectomy          Current Outpatient Prescriptions  Medication Sig Dispense Refill  . calcium-vitamin D (OSCAL WITH D) 250-125 MG-UNIT per tablet Take 1 tablet by mouth daily.      . citalopram (CELEXA) 10 MG tablet Take 10 mg by mouth daily.      . clonazePAM (KLONOPIN) 0.25 MG disintegrating tablet Take 0.25 mg by mouth 2 (two) times daily as needed.      . cyanocobalamin 2000 MCG tablet Take 2,000 mcg by mouth daily.      . ferrous sulfate 325 (65 FE) MG tablet Take 325 mg by mouth daily with breakfast.      . multivitamin-iron-minerals-folic acid (THERAPEUTIC-M) TABS tablet Take 1 tablet by mouth daily.      Marland Kitchen zolpidem (AMBIEN) 10 MG tablet Take 10 mg by mouth at bedtime as needed for sleep.       No current facility-administered medications for this visit.      No Known Allergies    Family History  Problem Relation Age of Onset  . Heart attack Mother   . Heart disease Mother   . Cancer Father     unknown primary  . Diabetes Maternal Aunt   . Heart disease Maternal Aunt   . Cancer Paternal Aunt   . Cancer Paternal Uncle      History   Social History  . Marital Status: Married    Spouse Name: N/A    Number of Children: N/A  . Years of Education: N/A   Social History Main Topics  . Smoking status: Never Smoker   . Smokeless tobacco: None  . Alcohol Use: No  . Drug Use: No  . Sexually Active: Yes    Birth Control/ Protection: Post-menopausal   Other Topics Concern  . None   Social History Narrative  . None      REVIEW OF SYSTEMS - PERTINENT POSITIVES ONLY: Review of Systems - General ROS: negative for - chills, fever or weight loss Hematological and Lymphatic ROS: negative for - bleeding problems, blood clots or bruising Respiratory ROS: no cough, shortness of breath, or wheezing Cardiovascular ROS: no chest pain or dyspnea on exertion Gastrointestinal ROS: no abdominal pain, change in bowel habits, or black or bloody stools Genito-Urinary ROS: no dysuria, trouble voiding, or hematuria  EXAM: Filed Vitals:   07/12/12 1445  BP: 122/70  Pulse: 68  Resp: 16    General appearance: alert and cooperative Resp: clear to auscultation bilaterally Cardio:  regular rate and rhythm GI: normal findings: soft, non-tender   Procedure: Anoscopy Surgeon: Marcello Moores Diagnosis: condyloma  Assistant: Alphonzo Severance After the risks and benefits were explained, verbal consent was obtained for above procedure  Anesthesia: none Findings: L lateral perinal condyloma focus in a circular pattern, no internal lesions palpated or visualized   ASSESSMENT AND PLAN: Denise Rice is a 61 y.o. F with a recurrent perianal condyloma.  A biopsy did show some dysplasia.  On anoscopic exam she does not have any internal lesions.  I have recommended that she undergo laser ablation.  We discussed the management of anal warts. We discussed chemical destruction, immunotherapy, and surgical excision. I discussed the pros and cons of each approach. We discussed the risk and benefits and the expected outcome with chemical destruction  with agents such as podophyllin. I explained that podophyllin is generally not been effective and has a high recurrence rate. We discussed the use of Aldara ointment. I explained that it has a 30-70% chance at resolving or at least reducing the number of anal warts. I explained that it is applied 3 times a week at night and left on overnight. I explained that skin irritation is the most common side effect. We then discussed surgical excision specifically excision and fulguration. I explained how the surgery is performed. I explained that it can be painful however it generally has the highest success rate. We discussed the risk and benefits of surgery including but not limited to bleeding, infection, injury to surrounding structures, need to do a formal anoscopic exam to evaluate for anal canal warts, urinary retention, wart recurrence, and general anesthesia risk. We discussed the typical aftercare.       [name], MD Colon and Rectal Surgery / [facility]      Visit Diagnoses: 1. Condyloma     Primary Care Physician: Rachell Cipro, MD

## 2012-07-30 NOTE — Anesthesia Preprocedure Evaluation (Addendum)
Anesthesia Evaluation  Patient identified by MRN, date of birth, ID band Patient awake    Reviewed: Allergy & Precautions, H&P , NPO status , Patient's Chart, lab work & pertinent test results  History of Anesthesia Complications (+) PONV  Airway       Dental   Pulmonary neg pulmonary ROS,          Cardiovascular negative cardio ROS      Neuro/Psych negative neurological ROS  negative psych ROS   GI/Hepatic negative GI ROS, Neg liver ROS,   Endo/Other  negative endocrine ROS  Renal/GU negative Renal ROS     Musculoskeletal negative musculoskeletal ROS (+)   Abdominal   Peds  Hematology negative hematology ROS (+)   Anesthesia Other Findings   Reproductive/Obstetrics negative OB ROS                           Anesthesia Physical Anesthesia Plan  ASA: II  Anesthesia Plan: MAC   Post-op Pain Management:    Induction: Intravenous  Airway Management Planned: Simple Face Mask, Natural Airway and Nasal Cannula  Additional Equipment:   Intra-op Plan:   Post-operative Plan:   Informed Consent: I have reviewed the patients History and Physical, chart, labs and discussed the procedure including the risks, benefits and alternatives for the proposed anesthesia with the patient or authorized representative who has indicated his/her understanding and acceptance.   Dental advisory given  Plan Discussed with: CRNA  Anesthesia Plan Comments:         Anesthesia Quick Evaluation

## 2012-07-30 NOTE — Interval H&P Note (Signed)
History and Physical Interval Note:  07/30/2012 2:57 PM  Denise Rice  has presented today for surgery, with the diagnosis of condyloma  The various methods of treatment have been discussed with the patient and family. After consideration of risks, benefits and other options for treatment, the patient has consented to  Procedure(s): [name] (N/A) as a surgical intervention .  The patient's history has been reviewed, patient examined, no change in status, stable for surgery.  I have reviewed the patient's chart and labs.  Questions were answered to the patient's satisfaction.     [name], MD  [facility]

## 2012-07-30 NOTE — Op Note (Signed)
07/30/2012  3:53 PM  PATIENT:  Denise Rice  62 y.o. female  Patient Care Team: Rachell Cipro, MD as PCP - General (Family Medicine) Azalia Bilis, MD as Referring Physician (Obstetrics and Gynecology)  PRE-OPERATIVE DIAGNOSIS:  condyloma  POST-OPERATIVE DIAGNOSIS:  condyloma  PROCEDURE:  Procedure(s): LASER ABLATION CONDOLAMATA  SURGEON:  Surgeon(s): Leighton Ruff, MD  ASSISTANT: none   ANESTHESIA:   MAC  EBL: min  Total I/O In: 1000 [I.V.:1000] Out: -   Delay start of Pharmacological VTE agent (>24hrs) due to surgical blood loss or risk of bleeding:  no  DRAINS: none   SPECIMEN:  Source of Specimen:  R post perianal region  DISPOSITION OF SPECIMEN:  PATHOLOGY  COUNTS:  YES  PLAN OF CARE: Discharge to home after PACU  PATIENT DISPOSITION:  PACU - hemodynamically stable.  INDICATION: 62yo F with recurrent condyloma  OR FINDINGS: Large circular shaped condyloma in L posterior region, smaller area in R posterior region  DESCRIPTION: The patient was identified in the preoperative holding area and taken to the OR where they were laid prone on the operating room table. MAC anesthesia was smoothly induced.  The patient was then prepped and draped in the usual sterile fashion. A surgical timeout was performed indicating the correct patient, procedure, positioning and preoperative antibioitics. SCDs were noted to be in place and functioning prior to the operation.   After this was completed, a sponge was soaked in 5% acetic acid was placed over the perianal region. This was allowed to soak for 2 minutes. The sponge was removed and the perianal region was evaluated.  There were some white plaques in the R posterior perianal region and the condyloma was noted on the right.  A biopsy was sent of the right side.  The internal anal canal was evaluated via anoscopy with a Hill-Ferguson anoscope.  There were no lesions identified.  After this was completed, hemostasis was achieved  with electrocautery and all of the biopsy sites were closed using a 3-0 chromic suture.  Next the laser was brought onto the field and covered with a sterile drape.  The edges of the operative field were draped with wet towels.  Appropriate ventilation was obtained.  All staff were protected with small particle masks and goggles safe for the laser.  The laser was then activated. All condylomatous lesions were ablated. Hemostasis was then achieved using electrocautery. A thin layer of Silvadene was then placed over the lesions. A sterile dressing was applied over this. The patient was then awakened from anesthesia and sent to the postanesthesia care unit in stable condition. All counts were correct operating room staff. I was present and personally performed the entire procedure.

## 2012-08-02 ENCOUNTER — Encounter (HOSPITAL_BASED_OUTPATIENT_CLINIC_OR_DEPARTMENT_OTHER): Payer: Self-pay | Admitting: General Surgery

## 2012-08-04 ENCOUNTER — Other Ambulatory Visit (INDEPENDENT_AMBULATORY_CARE_PROVIDER_SITE_OTHER): Payer: Self-pay | Admitting: General Surgery

## 2012-08-04 ENCOUNTER — Encounter (INDEPENDENT_AMBULATORY_CARE_PROVIDER_SITE_OTHER): Payer: Self-pay | Admitting: General Surgery

## 2012-08-04 MED ORDER — OXYCODONE HCL 5 MG PO TABS: 5.0000 mg | ORAL_TABLET | ORAL | Status: DC | PRN

## 2012-08-16 ENCOUNTER — Ambulatory Visit (INDEPENDENT_AMBULATORY_CARE_PROVIDER_SITE_OTHER): Payer: Commercial Managed Care - PPO | Admitting: General Surgery

## 2012-08-16 ENCOUNTER — Encounter (INDEPENDENT_AMBULATORY_CARE_PROVIDER_SITE_OTHER): Payer: Self-pay | Admitting: General Surgery

## 2012-08-16 VITALS — BP 116/68 | HR 72 | Resp 16 | Ht 61.5 in | Wt 179.8 lb

## 2012-08-16 DIAGNOSIS — A630 Anogenital (venereal) warts: Secondary | ICD-10-CM

## 2012-08-16 NOTE — Progress Notes (Signed)
Denise Rice is a 62 y.o. female who is status post a laser ablation of condyloma on 7/18.  Her pathology was benign.  She is doing well.   Objective: Filed Vitals:   08/16/12 1515  BP: 116/68  Pulse: 72  Resp: 16    General appearance: alert and cooperative GI: soft, non-tender; bowel sounds normal; no masses,  no organomegaly  Incision: healing well   Assessment: s/p  Patient Active Problem List   Diagnosis Date Noted  . Condyloma 07/12/2012  . 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 Hearing loss sensory, bilateral 06/25/2012  . 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

## 2012-08-16 NOTE — Patient Instructions (Signed)
Return to office in 6 months for re-check.    Anal Warts and Anal Dysplasia Expanded  What are anal warts? Anal warts (also called "condyloma acuminata") are a condition that affects the area around and inside the anus. They may also affect the skin of the genital area. They first appear as tiny spots or growths, perhaps as small as the head of a pin, and may grow quite large and cover the entire anal area. They usually appear as a flesh or brownish color. Usually, they do not cause pain or discomfort and patients may be unaware that the warts are present. Some patients will experience symptoms such as itching, bleeding, mucus discharge and/or a feeling of a lump or mass in the anal area.  What causes anal warts? Warts are caused by the human papilloma virus (HPV), which is transmitted from person to person by direct contact. HPV is considered to be the most common sexually transmitted disease (STD). You may be upset when you are given this diagnosis and it is important to note that anal intercourse is not necessary to develop anal condylomata. Any contact exposure to the anal area (hand contact, secretions from a sexual partner) can result in HPV infection. Exposure to the virus could have occurred many years ago or from prior sexual partners, but you may have just recently developed the actual warts. How are anal warts diagnosed? Although potentially sensitive and difficult to talk about, your doctor may inquire as to the presence or absence of risk factors to include a history of anal intercourse, a positive HIV test or a chronically weakened immune system (medications for organ transplant patients, inflammatory bowel disease, rheumatoid arthritis, etc).  Physical examination should focus primarily on the anorectal examination and evaluation of the perineum (pelvic region) that includes the penile or vaginal area to look for warts. Digital rectal examination should be performed to rule out any mass.  Anoscopy is typically performed to look within the anal canal for additional warts. This involves inserting a small instrument about the size of a finger into your anus to help visualize the area. Speculum examination may also be performed to aid in vaginal examination in women.  Can I prevent myself from getting warts? The safest way to protect yourself from getting exposed to HPV or any other STD, is to use safe sex techniques. Abstain from sexual contact with individuals who have anal (or genital) warts. Since many individuals may be unaware that they suffer from this condition, sexual abstinence, condom protection or limiting sexual contact to a single partner will reduce the contagious virus that causes warts. However, using condoms whenever having any kind of intercourse may reduce, but not completely eliminate, the risk of HPV infection, as HPV is spread by skin-to-skin contact and can live in areas not covered by a condom. The U.S. Food and Drug Administration (FDA) has approved the vaccine Gardasil (vaccine against certain types of HPV that more commonly cause cervical and other HPV-related cancers) in certain patients age 21 to 61 prior to HPV exposure (sexual activity) to prevent the development of HPV- related cancers and associated precancerous lesions (called dysplasia). Your physician may recommend you to be evaluated if you would potentially be a candidate for this vaccine. However, the vaccine's role to prevent anal warts and anal cancer is unknown. Do these warts always need to be removed? Yes. If they are not removed, the warts usually grow larger and multiply. Left untreated, warts may lead to an increased risk of  anal cancer in the affected area. Fortunately, the risk of anal cancer is still very rare. What treatments are available for anal warts? Topical options for treating warts 1. Medications: If warts are very small and are located only on the skin around the anus, they may be  treated with a topical medication in the office and sometimes at home. Common topical medications applied directly to the warts are podophyllin, trichloroacetic acid and bichloroacetic acid. These office treatments do not require anesthesia and only take a few minutes to apply to the warts. Minor burning or discomfort may be experienced after treatment and, thus, most patients can return to work after the procedure. Your physician will recommend when to wash off the medication after treatment. Topical agents that can be applied at home on small warts include Imiquimod or 5-fluorouracial (5-FU), although how well they work to eliminate anal warts completely is unknown. Side effects include skin irritation, burning and painful ulcerations of the skin. If you develop severe side effects, immediately stop using the cream and contact your physician. 2. Cryotherapy: Anal warts may also be treated in the physician's office by freezing the warts with liquid nitrogen. Similar recovery as the topical agents mentioned above is expected. Surgical options for treating warts: Surgery provides immediate results, but must be performed using either a local anesthetic - such as Novocaine - or a general or spinal anesthetic, depending on the number and exact location of warts being treated. Fulguration (burning), surgical excision (removal), or a combination of both, are used to treat larger external and internal anal warts. Must I be hospitalized for surgical treatment? Surgical treatment of anal warts is usually performed as outpatient surgery.  How much time will I lose from work after surgical treatment? Most people are moderately uncomfortable for a few days after treatment, and pain medication may be prescribed. Depending on the extent of the disease, some people return to work the next day, while others may remain out of work for several days to weeks. Pain, discomfort and slight bleeding are expected in the recovery  period and may last at some level for several weeks. Excessive bleeding is abnormal and your physician should be informed immediately if you experience large amounts of bleeding. Clear, yellowish or blood tinged drainage or moisture will be expected for days to weeks after the procedure. Placement of a pad and frequent dressing changes will help lessen the moisture and itching associated with the drainage. Will a single treatment cure the problem? When warts are extensive, your surgeon may wish to perform the surgery in stages. Additionally, recurrent warts are common in over 50% of patients. The virus that causes the warts can live concealed in the skin that appears normal for several months before another wart develops. As new warts develop, they usually can be treated in the physician's office. Sometimes new warts develop so rapidly that office treatment may not be possible or could be quite uncomfortable. In these situations, a second, and occasionally, third outpatient surgical visit may be recommended. How long is treatment usually continued? Follow-up visits are necessary at frequent intervals for several months after the last wart is observed to be certain that no new warts occur. It is important to see your physician on a routine basis as recommended by her/him or if you notice any new lesions or new symptoms (pain, rectal bleeding). What can be done to avoid getting these warts again? In some cases, warts may recur repeatedly after successful removal, since the virus that  causes the warts often persists in a dormant state in the skin. Discuss with you physician how often you should be examined for recurrent warts.  To prevent further spread of HPV, safe sex practices are recommended and include sexual abstinence, condom protection or limiting sexual contact to single partner. As a precaution, sexual partners ought to be checked for warts and other sexual transmitted diseases (STD), even if they have  no symptoms. Your physician may also recommend that you be tested for other STDs.  What is anal dysplasia? Some warts have abnormal changes seen by the pathologist when they look at the removed wart under the microscope. These changes are called anal dysplasia and can be graded as to how advanced their dysplasia or abnormal changes are under the microscope. These changes are referred to by physicians as low-grade and high-grade anal intraepithelial neoplasia (LGAIN/HGAIN). Cells that are becoming malignant or "premalignant", but have not invaded deeper into the skin, are often referred to as HGAIN. While this condition is likely a precursor to anal cancer, this is not anal cancer and is treated differently than anal cancer. Anal dysplasia (LGAIN/HGAIN) is similar to cervical dysplasia (cervical intraepithelial neoplasia or CIN) in that it originates from a HPV infection and can develop into anal and cervical cancer, respectively. Thus, patients with anal dysplasia need close follow up determined by their physician and any new lesions must be evaluated promptly. A gynecologic examination is also recommended in females, as the presence of HGAIN puts a female patient at risk for having CIN. Who is at increased risk for anal dysplasia? Risk factors for anal dysplasia include: 1. Patients with HPV infections (most common) 2. Patients with history of anal intercourse 3. Positive HIV test 4. Cigarette smoking  5. Patients with a weakened immune system from certain medications (solid organ transplantation, RA, IBD) Why do we need to treat anal dysplasia? Once you have anal dysplasia, it rarely disappears. Although still exceedingly uncommon, there is a slight increase risk of anal cancer in patients with a history of anal dysplasia (less than 5%). Its progression in HIV-positive patients seems to be higher. The risk for progression to anal cancer may be as high as 10-50% among HIV-positive patients. How is anal  dysplasia diagnosed? Anal dysplasia can be found in anal warts or sometimes these changes are found incidentally at the time of unrelated anal surgery (i.e. hemorrhoid surgery).  Screening procedures available to detect anal dysplasia include anal cytology and high-resolution anoscopy (HRA). However, they are not universally performed and their role in the management of patients with anal dysplasia is unknown at this time. 1. Anal Papanicolaou (Pap) smear cytology consists of using anal swabs to sample cells from the anal canal and can be used for both screening patients considered high-risk (see list above) and as follow up after anal dysplasia has been treated. Unfortunately, up to 45% of patients can have a false-positive test by anal PAP for anal dysplasia. As well, it is not known if anal PAP improves your outcome or decreases your risk of anal cancer.  2. HRA typically involves the application of temporary stains (3% acetic acid and Lugol's iodine solution) to the anal canal followed by evaluation under high-resolution microscopy to help differentiate normal from abnormal tissue. This is very similar to colposcopy (examination of the cervix) in women who have cervical dysplasia. Directed biopsies are performed for any questionable areas and to identify areas that may need further treatment. How is anal dysplasia treated? 1. Observation alone  with close clinical follow-up may be considered for the treatment of anal dysplasia. There has been much debate regarding the best treatment for anal dysplasia. There is a high risk of recurrence following treatments so physicians may recommend close observation and physical examination every 3-6 months depending on your risk factors and if the screening procedures discussed are available in your area. However, due to the high risk of also having cervical dysplasia, a referral to gynecology is recommended. 2. Topical 5% imiquimod (Aldara) cream or 5% 5-fluorouracil  (5-FU) cream may be applied to areas of anal dysplasia. Local treatment creams may be needed for 9-16 weeks. Up to 90% may have anal lesions disappear, although as many as 50% can recur. Local side effects are very common, occurring in up to 85%, and include skin irritation and hypo-pigmentation (loss of color around the anus), but these stop or reverse after you stop the cream. 3. Photodynamic therapy may be used in select patients. Similar to use in other types of skin cancers, photodynamic therapy has been described in patients with anal dysplasia since 1992. In this process, photosensitizing agents such as 5-aminolevulinic acid creams are applied to the affected area followed by treatment with a specific wavelength laser. Studies have been limited and its role in patients with anal dysplasia is still unknown. 4. Targeted destruction and close clinical long-term follow-up. A variety of surgical techniques to remove anal dysplasia have been used to prevent disease progression. These include wide local excision and targeted therapy using high-resolution anoscopy (HRA). Wide local excision's goal is to remove all of the affected areas with normal surrounding tissue ("negative margins"), although total removal of all disease is often difficult. Sometimes a local flap of normal tissue adjacent to the removed area is used to cover the large defect. There is still a high rate of recurrence of anal dysplasia despite a wide removal of tissue and high rates of complications such as stenosis anal (narrowing of the area) and fecal incontinence. Targeted destruction guided by high-resolution anoscopy is effective to identify, biopsy and destroy anal dysplasia without the long recovery and complications associated with wide local excision. However, there is still a high risk of persistent or recurrent disease, reported in up to 20-80%. Surgical complications such as incontinence and stenosis are generally not seen with HRA,  though. Fortunately, both wide local excision and targeted destruction by HRA have been shown to prevent progression from anal dysplasia to anal cancer. How should I be followed after treatment for anal dysplasia? Patients with anal dysplasia should usually be closely followed long term to prevent or detect recurrence, persistence or progression to anal cancer. Physical examinations may be performed at 3 to 73-month intervals. This approach allows for the treatment of recurrent or persistent dysplasia or the detection of anal cancer. Follow-up generally includes digital rectal examination, anoscopic examination, with or without the aid of magnification or the application of acetic acid and Lugol's solution, and can be performed in an office setting. Anorectal cytology (anal PAP) and/or biopsy may also be included if available in your area. The importance of close follow-up cannot be over emphasized in patients with history of anal dysplasia especially if new lesions develop. What is a colon and rectal surgeon? Colon and rectal surgeons are experts in the surgical and non-surgical treatment of diseases of the colon, rectum and anus. They have completed advanced surgical training in the treatment of these diseases as well as full general surgical training. Board-certified colon and rectal surgeons complete residencies  in general surgery and colon and rectal surgery, and pass intensive examinations conducted by the American Board of Surgery and the American Board of Colon and Rectal Surgery. They are well-versed in the treatment of both benign and malignant diseases of the colon, rectum and anus and are able to perform routine screening examinations and surgically treat conditions if indicated to do so. Disclaimer The [employer] [employer] [facility] is dedicated to ensuring high-quality patient care by advancing the science, prevention, and management of disorders and diseases of the colon, rectum,  and anus. These brochures are inclusive, and not prescriptive. Their purpose is to provide information on diseases processes, rather than dictate a specific form of treatment. They are intended for the use of all practitioners, health care workers, and patients who desire information about the management of the conditions addressed by the topics covered in these brochures. It should be recognized that these brochures should not be deemed inclusive of all proper methods of care or exclusive of methods of care reasonably directed to obtaining the same results. The ultimate judgment regarding the propriety of any specific procedure must be made by the physician in light of all the circumstances presented by the individual patient.  author: Heath Lark, MD, FASCRS, on behalf of the [facility]

## 2012-09-08 ENCOUNTER — Other Ambulatory Visit: Payer: Self-pay | Admitting: Gynecology

## 2012-09-08 MED ORDER — CLONAZEPAM 0.25 MG PO TBDP: 0.2500 mg | ORAL_TABLET | Freq: Two times a day (BID) | ORAL | Status: DC | PRN

## 2012-09-10 ENCOUNTER — Other Ambulatory Visit: Payer: Self-pay | Admitting: Gynecology

## 2012-09-10 MED ORDER — CLONAZEPAM 0.25 MG PO TBDP: 0.2500 mg | ORAL_TABLET | Freq: Two times a day (BID) | ORAL | Status: DC | PRN

## 2012-10-12 ENCOUNTER — Other Ambulatory Visit: Payer: Self-pay | Admitting: Obstetrics & Gynecology

## 2012-10-12 NOTE — Telephone Encounter (Signed)
Please advise #[id number] was sent through to pharmacy 07/14/12.

## 2012-10-19 ENCOUNTER — Other Ambulatory Visit: Payer: Self-pay | Admitting: Obstetrics & Gynecology

## 2012-10-25 ENCOUNTER — Other Ambulatory Visit (HOSPITAL_COMMUNITY): Payer: Self-pay | Admitting: Family Medicine

## 2012-10-25 DIAGNOSIS — Z1231 Encounter for screening mammogram for malignant neoplasm of breast: Secondary | ICD-10-CM

## 2012-11-12 ENCOUNTER — Other Ambulatory Visit: Payer: Self-pay | Admitting: Gynecology

## 2012-11-12 DIAGNOSIS — F419 Anxiety disorder, unspecified: Secondary | ICD-10-CM

## 2012-11-12 MED ORDER — ALPRAZOLAM 0.5 MG PO TABS: 0.5000 mg | ORAL_TABLET | Freq: Every evening | ORAL | Status: DC | PRN

## 2012-11-15 ENCOUNTER — Ambulatory Visit (HOSPITAL_COMMUNITY)
Admission: RE | Admit: 2012-11-15 | Discharge: 2012-11-15 | Disposition: A | Discharge status: Home / Self Care | Payer: 59 | Source: Ambulatory Visit | Attending: Family Medicine | Admitting: Family Medicine

## 2012-11-15 DIAGNOSIS — Z1231 Encounter for screening mammogram for malignant neoplasm of breast: Secondary | ICD-10-CM | POA: Insufficient documentation

## 2012-11-18 ENCOUNTER — Other Ambulatory Visit: Payer: Self-pay

## 2012-11-22 ENCOUNTER — Other Ambulatory Visit: Payer: Self-pay | Admitting: Family Medicine

## 2012-11-22 DIAGNOSIS — R928 Other abnormal and inconclusive findings on diagnostic imaging of breast: Secondary | ICD-10-CM

## 2012-11-23 ENCOUNTER — Ambulatory Visit
Admission: RE | Admit: 2012-11-23 | Discharge: 2012-11-23 | Disposition: A | Discharge status: Home / Self Care | Payer: 59 | Source: Ambulatory Visit | Attending: Family Medicine | Admitting: Family Medicine

## 2012-11-23 DIAGNOSIS — R928 Other abnormal and inconclusive findings on diagnostic imaging of breast: Secondary | ICD-10-CM

## 2012-12-01 ENCOUNTER — Other Ambulatory Visit: Payer: 59

## 2012-12-10 ENCOUNTER — Other Ambulatory Visit: Payer: 59

## 2013-01-14 ENCOUNTER — Ambulatory Visit (INDEPENDENT_AMBULATORY_CARE_PROVIDER_SITE_OTHER): Payer: 59 | Admitting: Certified Nurse Midwife

## 2013-01-14 ENCOUNTER — Encounter: Payer: Self-pay | Admitting: Certified Nurse Midwife

## 2013-01-14 VITALS — BP 124/80 | HR 72 | Temp 97.6°F | Resp 16 | Ht 60.75 in

## 2013-01-14 DIAGNOSIS — R5383 Other fatigue: Secondary | ICD-10-CM

## 2013-01-14 DIAGNOSIS — R059 Cough, unspecified: Secondary | ICD-10-CM

## 2013-01-14 DIAGNOSIS — R5381 Other malaise: Secondary | ICD-10-CM

## 2013-01-14 DIAGNOSIS — R05 Cough: Secondary | ICD-10-CM

## 2013-01-14 MED ORDER — DEXTROMETHORPHAN-GUAIFENESIN 25-225 MG/5ML PO SOLN: 5.0000 mL | Freq: Two times a day (BID) | ORAL | Status: DC

## 2013-01-14 NOTE — Progress Notes (Signed)
   Subjective:    Patient ID: Denise Rice, female    DOB: [birth year], 63 y.o.   MRN: [id number]  HPI Patient here complaining of unproductive cough, fullness in ears (has hearing aid and cochlear implant) and nasal irritation. Denies fever or chills or chest pain or difficulty breathing some fatigue. "Feels like continued tickle in throat". Slight pink nasal discharge this am. No one else in family ill. Drinking fluids and eating well. No other complaints.    Review of Systems  Constitutional: Positive for fatigue. Negative for fever, chills and appetite change.  HENT: Positive for hearing loss, postnasal drip and sneezing. Negative for congestion, ear pain, mouth sores, nosebleeds, sore throat, trouble swallowing and voice change.   Eyes: Negative.   Respiratory: Positive for cough. Negative for chest tightness, shortness of breath, wheezing and stridor.   Cardiovascular: Negative.   Gastrointestinal: Negative.   Musculoskeletal: Negative.   Skin: Negative for pallor.  Allergic/Immunologic: Negative for environmental allergies and food allergies.  Neurological: Negative for dizziness, weakness, light-headedness and headaches.       Objective:   Physical Exam  Constitutional: She is oriented to person, place, and time. She appears well-developed and well-nourished.  HENT:  Head: Normocephalic.  Right Ear: External ear normal. No drainage. Tympanic membrane is not bulging. Decreased hearing is noted.  Left Ear: External ear normal. No drainage. Tympanic membrane is not bulging. Decreased hearing is noted.  Nose: Mucosal edema present. Right sinus exhibits no maxillary sinus tenderness and no frontal sinus tenderness. Left sinus exhibits no maxillary sinus tenderness and no frontal sinus tenderness.  Mouth/Throat: Uvula is midline and mucous membranes are normal. No oropharyngeal exudate or posterior oropharyngeal edema.  Eyes: Right eye exhibits no discharge. Left eye exhibits no  discharge.  Neck: No tracheal deviation present.  Cardiovascular: Normal rate and regular rhythm.   Pulmonary/Chest: Effort normal and breath sounds normal. No respiratory distress. She has no wheezes. She has no rales. She exhibits no tenderness.  Neurological: She is alert and oriented to person, place, and time.  Skin: Skin is warm and dry.  Psychiatric: She has a normal mood and affect. Judgment normal.  Slight redness of nasal mucosa, no blood present. Slight redness of oropharyngeal area, non tender with swab.      Assessment & Plan:   A:Cough, unproductive with nasal irritation noted. ? Seasonal allergies vs cold. History of hearing loss with hearing aid and cochlear implant  P:Discussed findings with patient , no antibiotics indicated. Discussed salt water inhalation for soothing of mucous membranes and steam vaporizer in area she sleeps in. Tylenol if needed for discomfort and rest.  Increase fluids orally. Rx Duratuss see order with instructions Notify if nasal bleeding or fever, may need urgent care visit. Questions addressed.  Rv prn

## 2013-01-14 NOTE — Progress Notes (Signed)
Reviewed personally.  M. Suzanne Cabell Lazenby, MD.  

## 2013-01-14 NOTE — Patient Instructions (Signed)

## 2013-01-26 ENCOUNTER — Other Ambulatory Visit: Payer: Self-pay | Admitting: Gynecology

## 2013-01-26 MED ORDER — CLONAZEPAM 1 MG PO TABS: 1.0000 mg | ORAL_TABLET | ORAL | Status: DC | PRN

## 2013-01-26 NOTE — Progress Notes (Signed)
Increased stressors at home, has rx for klonapin 0.25 using more frequently as 0.75mg  needs refill.  Using mostly on weekends prn

## 2013-01-27 ENCOUNTER — Telehealth: Payer: Self-pay | Admitting: *Deleted

## 2013-01-27 NOTE — Telephone Encounter (Signed)
Patient went to pick up RX at pharmacy for Clonazepam and it was not at pharm.  Viewed in EPIC (it is visible but must be called in)  called Clonazepam 1 mg #30, 1 po QD prn anxiety with no refills called to [place] out pt pharm and patient notified.  Routing to provider for final review. Patient agreeable to disposition. Will close encounter

## 2013-02-01 ENCOUNTER — Ambulatory Visit (INDEPENDENT_AMBULATORY_CARE_PROVIDER_SITE_OTHER): Payer: Commercial Managed Care - PPO | Admitting: General Surgery

## 2013-02-02 ENCOUNTER — Ambulatory Visit (INDEPENDENT_AMBULATORY_CARE_PROVIDER_SITE_OTHER): Payer: Commercial Managed Care - PPO | Admitting: General Surgery

## 2013-02-02 ENCOUNTER — Encounter (INDEPENDENT_AMBULATORY_CARE_PROVIDER_SITE_OTHER): Payer: Self-pay | Admitting: General Surgery

## 2013-02-02 VITALS — BP 125/70 | HR 74 | Temp 98.6°F | Resp 18 | Ht 61.0 in | Wt 186.4 lb

## 2013-02-02 DIAGNOSIS — A630 Anogenital (venereal) warts: Secondary | ICD-10-CM

## 2013-02-02 NOTE — Patient Instructions (Signed)
Call the office if you develop any new lesions

## 2013-02-02 NOTE — Progress Notes (Signed)
Denise Rice is a 63 y.o. 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  Assessment and Plan: Doing well after surgery. No signs of recurrence. Follow up when necessary   .[name], MD [facility], [place]

## 2013-03-01 ENCOUNTER — Other Ambulatory Visit: Payer: 59

## 2013-03-04 ENCOUNTER — Encounter: Payer: Self-pay | Admitting: Gynecology

## 2013-03-09 ENCOUNTER — Telehealth: Payer: Self-pay | Admitting: Obstetrics and Gynecology

## 2013-03-09 MED ORDER — CYCLOBENZAPRINE HCL 10 MG PO TABS: 10.0000 mg | ORAL_TABLET | Freq: Three times a day (TID) | ORAL | Status: DC | PRN

## 2013-03-09 NOTE — Telephone Encounter (Signed)
Request for Flexeril for back injury while walking dog. Back also injured 2 weeks ago at spin class.  Will give Flexeril Rx.  See Epic.

## 2013-03-21 ENCOUNTER — Other Ambulatory Visit: Payer: Self-pay | Admitting: Gynecology

## 2013-03-21 NOTE — Telephone Encounter (Signed)
Last refilled: 01/26/13 #30/0 Refills Last AEX: 06/24/12  No Current AEX scheduled   Please Advise.

## 2013-04-18 ENCOUNTER — Other Ambulatory Visit: Payer: Self-pay

## 2013-04-18 MED ORDER — ZOLPIDEM TARTRATE 10 MG PO TABS: ORAL_TABLET | ORAL | Status: DC

## 2013-04-18 NOTE — Telephone Encounter (Signed)
Pt had aex 6/14. Refill came in where patient is requesting zolpidem tartrate 10mg  #90 . Please approve or deny rx. Rx will print & then need to be faxed back to the pharmacy at [phone] & then close encounter

## 2013-05-10 ENCOUNTER — Ambulatory Visit (INDEPENDENT_AMBULATORY_CARE_PROVIDER_SITE_OTHER): Payer: 59 | Admitting: Emergency Medicine

## 2013-05-10 ENCOUNTER — Ambulatory Visit: Payer: 59

## 2013-05-10 VITALS — BP 98/70 | HR 90 | Temp 99.4°F | Resp 16 | Ht 60.0 in | Wt 189.0 lb

## 2013-05-10 DIAGNOSIS — R197 Diarrhea, unspecified: Secondary | ICD-10-CM

## 2013-05-10 LAB — POCT CBC
Granulocyte percent: 85.9 %G — AB (ref 37–80)
HCT, POC: 41.9 % (ref 37.7–47.9)
Hemoglobin: 14.1 g/dL (ref 12.2–16.2)
Lymph, poc: 0.5 — AB (ref 0.6–3.4)
MCH, POC: 28.1 pg (ref 27–31.2)
MCHC: 33.7 g/dL (ref 31.8–35.4)
MCV: 83.6 fL (ref 80–97)
MID (cbc): 0.4 (ref 0–0.9)
MPV: 8.4 fL (ref 0–99.8)
PLATELET COUNT, POC: 218 10*3/uL (ref 142–424)
POC GRANULOCYTE: 5.8 (ref 2–6.9)
POC LYMPH PERCENT: 8 %L — AB (ref 10–50)
POC MID %: 6.1 % (ref 0–12)
RBC: 5.01 M/uL (ref 4.04–5.48)
RDW, POC: 14.3 %
WBC: 6.7 10*3/uL (ref 4.6–10.2)

## 2013-05-10 LAB — POCT URINALYSIS DIPSTICK
Bilirubin, UA: NEGATIVE
Glucose, UA: NEGATIVE
Ketones, UA: NEGATIVE
NITRITE UA: NEGATIVE
Protein, UA: NEGATIVE
RBC UA: NEGATIVE
Spec Grav, UA: 1.02
UROBILINOGEN UA: 0.2
pH, UA: 6

## 2013-05-10 LAB — POCT UA - MICROSCOPIC ONLY
CRYSTALS, UR, HPF, POC: NEGATIVE
Mucus, UA: POSITIVE
YEAST UA: NEGATIVE

## 2013-05-10 LAB — POCT SEDIMENTATION RATE: POCT SED RATE: 40 mm/hr — AB (ref 0–22)

## 2013-05-10 LAB — POCT RAPID STREP A (OFFICE): Rapid Strep A Screen: NEGATIVE

## 2013-05-10 NOTE — Progress Notes (Signed)
[facility] [address] [place] [address] [phone]  Date:  05/10/2013   Name:  Denise Rice   DOB:  12-21-1950   MRN:  [id number]  PCP:  Rachell Cipro, MD    Chief Complaint: Diarrhea, Sore Throat and Generalized Body Aches   History of Present Illness:  Denise Rice is a 63 y.o. very pleasant female patient who presents with the following:  Underwent a gastric diversion for weight loss in 2013 and has lost 60 pounds.  Weight loss has tapered off.  Felt well until Sunday when she had a large amount of uncharacteristically loose stool that required her to change the sheets.  The patient has no complaint of blood, mucous, or pus in her stools. Has experienced some nausea and anorexia since.  No fever or chills.  No further diarrhea but stools are looser than normal.  No abdominal pain.  Poor appetite.  Has sore throat and generalized myalgias and arthralgias.  No improvement with over the counter medications or other home remedies. Denies other complaint or health concern today.   Patient Active Problem List   Diagnosis Date Noted  . 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 Hearing loss sensory, bilateral 06/25/2012  . 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 Anal condyloma   . History of peptic ulcer   . Otosclerosis of left ear   . Hearing loss, sensorineural, high frequency     RIGHT EAR  . 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 PCO (polycystic ovaries)   . Wears hearing aid     RIGHT EAR  . Deafness in left ear   . PONV (postoperative nausea and vomiting)     Past Surgical History  Procedure Laterality Date  . Vaginal hysterectomy  2007    fibroids  . Roux-en-y gastric bypass  AUG 2013  . Laparoscopic gastric banding  02-08-2007  . Strabismus surgery Right   . Laparoscopic cholecystectomy  03-31-2005  . Implantation bone anchored hearing aid Left 07-27-2008    left temporal bone  .  Stapedes surgery Left 1985;  1994 x2;  02-03-1998  . Blepharoplasty  03-05-2000  . Laser ablation condolamata N/A 07/30/2012    Procedure: LASER ABLATION CONDOLAMATA;  Surgeon: Leighton Ruff, MD;  Location: [facility];  Service: General;  Laterality: N/A;    History  Substance Use Topics  . Smoking status: Never Smoker   . Smokeless tobacco: Never Used  . Alcohol Use: No    Family History  Problem Relation Age of Onset  . Heart attack Mother   . Heart disease Mother   . Cancer Father     unknown primary  . Diabetes Maternal Aunt   . Heart disease Maternal Aunt   . Cancer Paternal Aunt   . Cancer Paternal Uncle   . Stroke Sister     No Known Allergies  Medication list has been reviewed and updated.  Current Outpatient Prescriptions on File Prior to Visit  Medication Sig Dispense Refill  . Biotin 5000 MCG CAPS Take by mouth daily.      . Calcium Carbonate-Vitamin D (CALCIUM 600 + D PO) Take 2 tablets by mouth daily.      . clonazePAM (KLONOPIN) 1 MG tablet TAKE 1 TABLET BY MOUTH ONCE DAILY AS NEEDED ANXIETY  30 tablet  5  . cyanocobalamin 2000 MCG tablet Take 2,000 mcg by mouth daily.      Marland Kitchen  cyclobenzaprine (FLEXERIL) 10 MG tablet Take 1 tablet (10 mg total) by mouth 3 (three) times daily as needed for muscle spasms.  30 tablet  0  . escitalopram (LEXAPRO) 10 MG tablet Take 10 mg by mouth daily.      . ferrous sulfate 325 (65 FE) MG tablet Take 325 mg by mouth daily with breakfast.      . zolpidem (AMBIEN) 10 MG tablet TAKE 1 TABLET BY MOUTH AT BEDTIME AS NEEDED  90 tablet  0   No current facility-administered medications on file prior to visit.    Review of Systems:  As per HPI, otherwise negative.    Physical Examination: Filed Vitals:   05/10/13 1703  BP: 98/70  Pulse: 90  Temp: 99.4 F (37.4 C)  Resp: 16   Filed Vitals:   05/10/13 1703  Height: 5' (1.524 m)  Weight: 189 lb (85.73 kg)   Body mass index is 36.91 kg/(m^2). Ideal Body  Weight: Weight in (lb) to have BMI = 25: 127.7  GEN: WDWN, NAD, Non-toxic, A & O x 3 HEENT: Atraumatic, Normocephalic. Neck supple. No masses, No LAD. Ears and Nose: No external deformity. CV: RRR, No M/G/R. No JVD. No thrill. No extra heart sounds. PULM: CTA B, no wheezes, crackles, rhonchi. No retractions. No resp. distress. No accessory muscle use. ABD: S, NT, ND, +BS. No rebound. No HSM. EXTR: No c/c/e NEURO Normal gait.  PSYCH: Normally interactive. Conversant. Not depressed or anxious appearing.  Calm demeanor.    Assessment and Plan: Arthralgias Sore throat, likely viral Abdominal mass right abdomen likely stool Increase liquids Tylenol Red flags discussed, agrees and understands  Signed,  Ellison Carwin, MD   [facility] reading (PRIMARY) by  Dr. Ouida Sills.  Suggestion of right midabdominal mass.  History of roux en y gastric diversion.  Results for orders placed in visit on 05/10/13  POCT CBC      Result Value Ref Range   WBC 6.7  4.6 - 10.2 K/uL   Lymph, poc 0.5 (*) 0.6 - 3.4   POC LYMPH PERCENT 8.0 (*) 10 - 50 %L   MID (cbc) 0.4  0 - 0.9   POC MID % 6.1  0 - 12 %M   POC Granulocyte 5.8  2 - 6.9   Granulocyte percent 85.9 (*) 37 - 80 %G   RBC 5.01  4.04 - 5.48 M/uL   Hemoglobin 14.1  12.2 - 16.2 g/dL   HCT, POC 41.9  37.7 - 47.9 %   MCV 83.6  80 - 97 fL   MCH, POC 28.1  27 - 31.2 pg   MCHC 33.7  31.8 - 35.4 g/dL   RDW, POC 14.3     Platelet Count, POC 218  142 - 424 K/uL   MPV 8.4  0 - 99.8 fL  POCT UA - MICROSCOPIC ONLY      Result Value Ref Range   WBC, Ur, HPF, POC 3-5     RBC, urine, microscopic 0-6     Bacteria, U Microscopic trace     Mucus, UA positive     Epithelial cells, urine per micros 4-5     Crystals, Ur, HPF, POC neg     Casts, Ur, LPF, POC Renal Tubulars     Yeast, UA neg    POCT URINALYSIS DIPSTICK      Result Value Ref Range   Color, UA dk yellow     Clarity, UA clear     Glucose, UA neg  Bilirubin, UA neg     Ketones, UA neg      Spec Grav, UA 1.020     Blood, UA neg     pH, UA 6.0     Protein, UA neg     Urobilinogen, UA 0.2     Nitrite, UA neg     Leukocytes, UA Trace

## 2013-05-11 LAB — COMPREHENSIVE METABOLIC PANEL
ALBUMIN: 4.3 g/dL (ref 3.5–5.2)
ALT: 27 U/L (ref 0–35)
AST: 27 U/L (ref 0–37)
Alkaline Phosphatase: 92 U/L (ref 39–117)
BUN: 17 mg/dL (ref 6–23)
CHLORIDE: 100 meq/L (ref 96–112)
CO2: 28 mEq/L (ref 19–32)
Calcium: 9.2 mg/dL (ref 8.4–10.5)
Creat: 0.78 mg/dL (ref 0.50–1.10)
Glucose, Bld: 99 mg/dL (ref 70–99)
POTASSIUM: 5 meq/L (ref 3.5–5.3)
SODIUM: 135 meq/L (ref 135–145)
Total Bilirubin: 0.6 mg/dL (ref 0.2–1.2)
Total Protein: 7.3 g/dL (ref 6.0–8.3)

## 2013-05-13 ENCOUNTER — Telehealth: Payer: Self-pay

## 2013-05-13 NOTE — Telephone Encounter (Signed)
No need for repeat study.  If she is feeling well.  If not, she should follow up with her surgeon as we discussed.

## 2013-05-13 NOTE — Telephone Encounter (Signed)
LMVM to CB. 

## 2013-05-13 NOTE — Telephone Encounter (Signed)
Spoke to patient advised her there is no need to repeat XRay if she is feeling well. She was very grateful.

## 2013-05-13 NOTE — Telephone Encounter (Signed)
Dr. Ouida Sills, Patient wants to know if you want her to come in for a repeat XRay. Please advise.

## 2013-05-19 ENCOUNTER — Ambulatory Visit
Admission: RE | Admit: 2013-05-19 | Discharge: 2013-05-19 | Disposition: A | Discharge status: Home / Self Care | Payer: 59 | Source: Ambulatory Visit | Attending: Family Medicine | Admitting: Family Medicine

## 2013-05-19 ENCOUNTER — Encounter: Payer: Self-pay | Admitting: Emergency Medicine

## 2013-05-19 ENCOUNTER — Encounter: Payer: Self-pay | Admitting: Gynecology

## 2013-05-19 ENCOUNTER — Other Ambulatory Visit: Payer: Self-pay | Admitting: Family Medicine

## 2013-05-19 DIAGNOSIS — R109 Unspecified abdominal pain: Secondary | ICD-10-CM

## 2013-05-19 DIAGNOSIS — R9389 Abnormal findings on diagnostic imaging of other specified body structures: Secondary | ICD-10-CM

## 2013-05-20 ENCOUNTER — Telehealth: Payer: Self-pay | Admitting: *Deleted

## 2013-05-20 NOTE — Telephone Encounter (Signed)
05-19-13 at 1400, Patient requesting update on x-ray result.  Initially result  Was not available.Result available approx 315 and patient aware Dr Charlies Constable to review. Patient has sent My Chart message to Dr Charlies Constable as well.  She is aware Dr lathrop is out of office.   Routing to provider for final review. Patient agreeable to disposition. Will close encounter

## 2013-05-20 NOTE — Telephone Encounter (Signed)
I, Dr. Sabra Heck, reviewed it personally and notified Mrs. Malmgren via [place].  Encounter closed.

## 2013-05-27 ENCOUNTER — Telehealth: Payer: Self-pay | Admitting: *Deleted

## 2013-05-27 DIAGNOSIS — M62830 Muscle spasm of back: Secondary | ICD-10-CM

## 2013-05-27 DIAGNOSIS — K649 Unspecified hemorrhoids: Secondary | ICD-10-CM

## 2013-05-27 MED ORDER — HYDROCORTISONE ACE-PRAMOXINE 1-1 % RE FOAM: 1.0000 | Freq: Two times a day (BID) | RECTAL | Status: DC

## 2013-05-27 NOTE — Telephone Encounter (Signed)
Pt states that she twisted her back and landed on her knee when she fell in the shower and her pain is more muscular and denies any issues with her head-no headache, visual changes, slurred speech.  Pt states that ER gave her vicoden, and now she also has a hemorrhoid. Suggest trying muscle relaxer-flexeril, proctocort for hemorrhoid and can use the lidocaine she has from her prior surgery

## 2013-05-27 NOTE — Telephone Encounter (Signed)
Patient is out of town and fell in shower. Was seen in ER and was given 10tabs of  Lortab 5/325.  Patient still very sore and will be returning in car 4 hour drive tomorrow.  Can she have refill?

## 2013-05-27 NOTE — Telephone Encounter (Signed)
Patient reports that although her eye is sore she does not have headache.  She is still very bruised and sore from impact where she hit her head but her biggest complaint is her back pain.  When she fell, her waist and hips landed on the step into the tub.  Reports difficult to stand up straight for any length of time.  Dr Charlies Constable spoke with patient directly. She will use heating pad and restricted activity.

## 2013-05-27 NOTE — Telephone Encounter (Signed)
Head injuries should not get narcotics, we need to clarify

## 2013-05-30 MED ORDER — CYCLOBENZAPRINE HCL 10 MG PO TABS: 10.0000 mg | ORAL_TABLET | Freq: Three times a day (TID) | ORAL | Status: DC | PRN

## 2013-05-30 NOTE — Addendum Note (Signed)
Addended by: Elveria Rising on: 05/30/2013 03:06 PM   Modules accepted: Orders

## 2013-07-18 ENCOUNTER — Other Ambulatory Visit: Payer: Self-pay | Admitting: *Deleted

## 2013-07-18 NOTE — Telephone Encounter (Signed)
Fax From: [facility] Last Refilled: 04/18/13 #90/0 refills by Dr. Sabra Heck Last AEX 06/24/12 with Dr. Charlies Constable No current AEX scheduled   Please advise.

## 2013-07-19 ENCOUNTER — Telehealth: Payer: Self-pay | Admitting: Emergency Medicine

## 2013-07-19 MED ORDER — ZOLPIDEM TARTRATE 10 MG PO TABS: ORAL_TABLET | ORAL | Status: DC

## 2013-07-19 NOTE — Telephone Encounter (Signed)
Patient calling for rx update on ambien 10 mg.  Rx was faxed to [facility] pharmacy. Confirmed receipt and patient aware.  Routing to provider for final review. Patient agreeable to disposition. Will close encounter

## 2013-08-19 ENCOUNTER — Encounter: Payer: Self-pay | Admitting: Podiatry

## 2013-08-19 ENCOUNTER — Ambulatory Visit (INDEPENDENT_AMBULATORY_CARE_PROVIDER_SITE_OTHER): Payer: 59

## 2013-08-19 ENCOUNTER — Ambulatory Visit (INDEPENDENT_AMBULATORY_CARE_PROVIDER_SITE_OTHER): Payer: 59 | Admitting: Podiatry

## 2013-08-19 VITALS — BP 138/89 | HR 69 | Resp 17

## 2013-08-19 DIAGNOSIS — R52 Pain, unspecified: Secondary | ICD-10-CM

## 2013-08-19 DIAGNOSIS — M2040 Other hammer toe(s) (acquired), unspecified foot: Secondary | ICD-10-CM

## 2013-08-19 DIAGNOSIS — M2042 Other hammer toe(s) (acquired), left foot: Secondary | ICD-10-CM

## 2013-08-19 NOTE — Patient Instructions (Signed)
Pre-Operative Instructions  Congratulations, you have decided to take an important step to improving your quality of life.  You can be assured that the doctors of [facility] will be with you every step of the way.  1. Plan to be at the surgery center/hospital at least 1 (one) hour prior to your scheduled time unless otherwise directed by the surgical center/hospital staff.  You must have a responsible adult accompany you, remain during the surgery and drive you home.  Make sure you have directions to the surgical center/hospital and know how to get there on time. 2. For hospital based surgery you will need to obtain a history and physical form from your family physician within 1 month prior to the date of surgery- we will give you a form for you primary physician.  3. We make every effort to accommodate the date you request for surgery.  There are however, times where surgery dates or times have to be moved.  We will contact you as soon as possible if a change in schedule is required.   4. No Aspirin/Ibuprofen for one week before surgery.  If you are on aspirin, any non-steroidal anti-inflammatory medications (Mobic, Aleve, Ibuprofen) you should stop taking it 7 days prior to your surgery.  You make take Tylenol  For pain prior to surgery.  5. Medications- If you are taking daily heart and blood pressure medications, seizure, reflux, allergy, asthma, anxiety, pain or diabetes medications, make sure the surgery center/hospital is aware before the day of surgery so they may notify you which medications to take or avoid the day of surgery. 6. No food or drink after midnight the night before surgery unless directed otherwise by surgical center/hospital staff. 7. No alcoholic beverages 24 hours prior to surgery.  No smoking 24 hours prior to or 24 hours after surgery. 8. Wear loose pants or shorts- loose enough to fit over bandages, boots, and casts. 9. No slip on shoes, sneakers are best. 10. Bring  your boot with you to the surgery center/hospital.  Also bring crutches or a walker if your physician has prescribed it for you.  If you do not have this equipment, it will be provided for you after surgery. 11. If you have not been contracted by the surgery center/hospital by the day before your surgery, call to confirm the date and time of your surgery. 12. Leave-time from work may vary depending on the type of surgery you have.  Appropriate arrangements should be made prior to surgery with your employer. 13. Prescriptions will be provided immediately following surgery by your doctor.  Have these filled as soon as possible after surgery and take the medication as directed. 14. Remove nail polish on the operative foot. 15. Wash the night before surgery.  The night before surgery wash the foot and leg well with the antibacterial soap provided and water paying special attention to beneath the toenails and in between the toes.  Rinse thoroughly with water and dry well with a towel.  Perform this wash unless told not to do so by your physician.  Enclosed: 1 Ice pack (please put in freezer the night before surgery)   1 Hibiclens skin cleaner   Pre-op Instructions  If you have any questions regarding the instructions, do not hesitate to call our office.  [place]: [address] [place], [place] [address] [phone]  [place]: [address] [place], [place] [address] [phone]  [place]: [address]  [place], [place] [address] [phone]  Dr. Richard   Tuchman DPM, Dr. Norman Regal DPM Dr. Richard Sikora DPM, Dr. M. Todd Hyatt DPM, Dr. Kathryn Egerton DPM 

## 2013-08-19 NOTE — Progress Notes (Signed)
   Subjective:    Patient ID: Denise Rice, female    DOB: [birth year], 63 y.o.   MRN: [id number]  HPI  This 63 year old female presents to the office today with complaints of left second digit hammertoe deformity. She states the toe has been painful for approximately 3-4 months. She states the pain is mostly localized to the "knuckle" portion of the second digit. She states that she is unable to wear closed toe shoes due to the pain. She is also concerned that the right second digit in her toe is worsening as well. However at this time the right foot is not painful. No other complaint at this time.    Review of Systems  Musculoskeletal:       Hammertoe pain  All other systems reviewed and are negative.      Objective:   Physical Exam  Nursing note and vitals reviewed. Constitutional: She is oriented to person, place, and time. She appears well-developed and well-nourished.  Musculoskeletal:  Left foot second digit hammertoe with flexion contracture at the PIPJ which is semi-reducible. Pain with palpation over the PIPJ. The second digit has a slight elevation upon weightbearing. The third digit also has slight elevation upon weightbearing however there is no significant hammertoe contracture and no pain. Right foot second digit slight hammertoe contracture with mild elevation upon weightbearing. Currently there is no pain associated with this digit and no pain with range of motion.  Neurological: She is alert and oriented to person, place, and time.  Protective sensation intact with the [employer] monofilament.  Skin: Skin is warm.  No open lesions. No hyperkeratotic lesions.   bilateral pedal pulses palpable 2/4 b/l. CRT <3 sec.         Assessment & Plan:  63 year old female with painful left second digit hammertoe deformity. -X-rays were obtained of the bilateral feet. The x-ray report for full report. There is noted to be second digit hammertoe contracture left foot. No  acute fractures. -At this time conservative as well as surgical intervention was discussed with the patient in detail. Conservative treatment includes padding, offloading, and shoe gear modifications. Surgical intervention was also discussed. At this time the patient has elected to undergo surgical correction of her left second digit hammertoe deformity. The procedure was discussed with the patient at length. All alternatives, risks, complications were discussed with the patient in detail. No promises or guarantees given as to the outcome of the surgery. Risks include, but not limited to, infection, nonhealing, flail/floppy toe, numbness/sensation alteration, blood clots. Patient understands the risks. -Surgical consent was reviewed with the patient and signed. -Patient will undergo surgery on 08/29/2013 for left second digit PIPJ arthrodesis with K wire fixation. -Patient will be reappointed after surgery. She is to call if any questions or concerns arise.

## 2013-08-22 ENCOUNTER — Encounter: Payer: Self-pay | Admitting: Podiatry

## 2013-08-29 DIAGNOSIS — M2040 Other hammer toe(s) (acquired), unspecified foot: Secondary | ICD-10-CM

## 2013-08-29 HISTORY — PX: HAMMER TOE SURGERY: SHX385

## 2013-08-30 ENCOUNTER — Telehealth: Payer: Self-pay | Admitting: Podiatry

## 2013-08-30 NOTE — Telephone Encounter (Signed)
Called patient to follow up with her after surgery. She states that the toe is pink and has a good refill time. Denies any fevers, chills, nausea, vomiting. Pain controlled. Ambulating in surgical shoe.  Follow-up as scheduled, or sooner if any problems arise.

## 2013-08-31 ENCOUNTER — Encounter: Payer: Self-pay | Admitting: Podiatry

## 2013-08-31 NOTE — Progress Notes (Signed)
error 

## 2013-09-01 ENCOUNTER — Encounter: Payer: Self-pay | Admitting: Podiatry

## 2013-09-01 NOTE — Progress Notes (Signed)
Dr Jacqualyn Posey performed a hammertoe repair of left 2nd met on 08/29/13.

## 2013-09-02 ENCOUNTER — Ambulatory Visit (INDEPENDENT_AMBULATORY_CARE_PROVIDER_SITE_OTHER): Payer: 59

## 2013-09-02 ENCOUNTER — Ambulatory Visit (INDEPENDENT_AMBULATORY_CARE_PROVIDER_SITE_OTHER): Payer: 59 | Admitting: Podiatry

## 2013-09-02 ENCOUNTER — Encounter: Payer: Self-pay | Admitting: Podiatry

## 2013-09-02 VITALS — BP 148/81 | HR 75 | Resp 16

## 2013-09-02 DIAGNOSIS — M2042 Other hammer toe(s) (acquired), left foot: Secondary | ICD-10-CM

## 2013-09-02 DIAGNOSIS — Z9889 Other specified postprocedural states: Secondary | ICD-10-CM

## 2013-09-02 DIAGNOSIS — M2040 Other hammer toe(s) (acquired), unspecified foot: Secondary | ICD-10-CM

## 2013-09-02 NOTE — Progress Notes (Signed)
   Subjective:    Patient ID: [name], female    DOB: 05-17-1950, 63 y.o.   MRN: [id number]  HPI Comments: "Its been burning a lot"  DOS 08-29-2013 POV Hammertoe repair 2nd toe left foot   Denies any systemic complaints at this fevers, chills, nausea, vomiting. States that she did go back to work today and is having some discomfort however is relieved by pain medication. She also states that she hit her toe this morning while getting out of the car. She has not been icing. She has been taking the medications as directed. No other complaints at this time to    Review of Systems  All other systems reviewed and are negative.      Objective:   Physical Exam DP/PT pulses palpable 2/4, CRT < 3 sec to all digits. Left 2nd digit the sutures intact and well coapted with no evidence of dehiscence. No drainage or ascending cellulitis. Moderate edema to the digit and around the MPJ.  Tenderness to palpation over the surgical site Neuro status intact and unchanged.      Assessment & Plan:  63-year-old female 5 days status post left second digit PIPJ arthrodesis with MPJ release and K wire fixation  -Patient is doing well and healing appropriately. -Continue pain medication as needed -With icing and elevation discussed the patient in detail. -Followup in 1 week or sooner if it comes are to arise. Sutures were removed at next appointment. -Monitor for any clinical signs or symptoms of infection and directed to return to the office immediately or call or go to the emergency room. Call with any questions or concerns.

## 2013-09-07 ENCOUNTER — Encounter: Payer: 59 | Admitting: Podiatry

## 2013-09-09 ENCOUNTER — Encounter: Payer: Self-pay | Admitting: Podiatry

## 2013-09-09 ENCOUNTER — Ambulatory Visit: Payer: 59 | Admitting: Podiatry

## 2013-09-09 VITALS — BP 132/80 | HR 74 | Resp 15

## 2013-09-09 DIAGNOSIS — Z9889 Other specified postprocedural states: Secondary | ICD-10-CM

## 2013-09-11 NOTE — Progress Notes (Signed)
Patient ID: [name], female   DOB: 09/03/1950, 63 y.o.   MRN: [id number]  Subjective:  63 year old female returns the office today 11 days status post left second digit PIPJ arthrodesis with K wire fixation. Patient states that she is doing well. Mild pain over the surgical site. Denies any systemic complaints such as fevers, chills, nausea, vomiting. No other complaints at this time.  Objective: AAO x3, NAD DP/PT pulses palpable b/l. CRT < 3sec Protective sensation intact Left second digit with K wire fixation. Sutures intact an incision well coapted without any evidence of dehiscence. No drainage, erythema. Decrease edema from last appointment. There is slight medial deviation of the digit Mild tenderness over the surgical site.  Assessment: 63 year old female 11 day status post left second digit PIPJ arthrodesis with K wire fixation  Plan: -At this appointment the sutures were removed as the incision was coapted. Steri-strips applied -Toe wrapped to help decrease edema-instructed her how to wrap the digit daily.  -Toe splint dispensed in order to help maintain alignment. -Continue with surgical shoe -Ice  -F/U in 2 week, or sooner if there are any questions/concerns/change in symptoms. She has my cell phone number if she needs anything or she can call the office -X-ray at next appointment

## 2013-09-12 ENCOUNTER — Telehealth: Payer: Self-pay | Admitting: *Deleted

## 2013-09-12 ENCOUNTER — Ambulatory Visit (INDEPENDENT_AMBULATORY_CARE_PROVIDER_SITE_OTHER): Payer: 59 | Admitting: Podiatry

## 2013-09-12 ENCOUNTER — Ambulatory Visit (INDEPENDENT_AMBULATORY_CARE_PROVIDER_SITE_OTHER): Payer: 59

## 2013-09-12 VITALS — Temp 97.9°F

## 2013-09-12 DIAGNOSIS — M2042 Other hammer toe(s) (acquired), left foot: Secondary | ICD-10-CM

## 2013-09-12 DIAGNOSIS — M2040 Other hammer toe(s) (acquired), unspecified foot: Secondary | ICD-10-CM

## 2013-09-12 DIAGNOSIS — Z9889 Other specified postprocedural states: Secondary | ICD-10-CM

## 2013-09-12 MED ORDER — CEPHALEXIN 500 MG PO CAPS: 500.0000 mg | ORAL_CAPSULE | Freq: Three times a day (TID) | ORAL | Status: DC

## 2013-09-12 MED ORDER — OXYCODONE-ACETAMINOPHEN 5-325 MG PO TABS: 1.0000 | ORAL_TABLET | Freq: Four times a day (QID) | ORAL | Status: DC | PRN

## 2013-09-12 NOTE — Telephone Encounter (Signed)
I'm in a lot of pain today.  It actually hurt all weekend.  When I walk I hear a clicking noise.  I would love to see someone today.  I asked her to hold while I check.  Dr. Jacqualyn Posey was here in the office so I asked him.  He said to have her come now for an appointment before 3:15pm.  She stated she is coming now.

## 2013-09-12 NOTE — Telephone Encounter (Signed)
I'm a patient of Dr. Jacqualyn Posey.  I saw him on Friday to have stitches removed from a Hammertoe Repair.  My toe was put in a splint.  My toe is absolutely killing me today.  A nonstop type of pain, it was like this most of the weekend.  I just about depleted my supply of Vicodin because I was in so much pain.  I was wondering if Dr. Jacqualyn Posey might be able to see me this afternoon after 3 I don't care or maybe tomorrow morning.  My toe is really hurting and I think I hear some sort of clicking in it.  So, I've love to come after 3 today or after 11 on tomorrow.  Please give me a call on my cell.  Patient came in and saw Dr. Jacqualyn Posey.

## 2013-09-12 NOTE — Progress Notes (Signed)
Patient ID: Denise Rice, female   DOB: [birth year], 63 y.o.   MRN: [id number]  Subjective: Patient states that on Saturday she started to develop increasing pain to her left 2nd digit. She was seen in the office on Friday when sutures were removed and she was placed into a toe splint to help straighten the digits. Since then she has had increasing pain. She does state that the toe is straighter since wearing the splint. She states she took some of her husbands pain medication over the weekend. She took the splint off yesterday, and did get some relief.  Denies any fevers, chills, nausea, vomiting. No other complaints at this time.  Objective: AAO x3, NAD DP/PT pulses palpable. CRT < 3sec Protective sensation intact.  Mild edema to the base of the 2nd digit. Incision is well coapted. There is no drainage/fluctuace/crepitance. No ascending cellulitis. Digit in good position. K-wire intact.  There is very mild erythema to the base of the digit, which is unchanged and is likely a result of inflammation.   Assessment: 63 year old female 2 weeks s/p L 2nd PIPJ arthrodesis with k-wire fixation.  Plan: -X-Rays were obtained. The k-wire does appear to be backing out compared to the initial postop xray. Digit is in good alignment.  -Pain could be a result of the digital splint which she started to wear Friday. D/C splint for now and discussed how to tape the digit in order to maintain good alignment.  -Currently no clinical signs/symptoms of infection. However, discussed with her to closely monitor the small amount of erythema present at the base of the digit. If it worsens, or if she has any symptoms of infection, to start taking keflex for which she was given a prescription.  -Prescribed Percocet prn pain. D/C vicodin. Informed not to drive while taking the pain medication.  -Follow up as scheduled next week, or sooner if there are any questions/concerns. She has my cell phone number/office pager number if  needed.  -Call with any questions/concerns.

## 2013-09-15 ENCOUNTER — Other Ambulatory Visit: Payer: Self-pay | Admitting: Gynecology

## 2013-09-16 MED ORDER — CLONAZEPAM 1 MG PO TABS: ORAL_TABLET | ORAL | Status: DC

## 2013-09-16 NOTE — Telephone Encounter (Signed)
Need annual

## 2013-09-16 NOTE — Addendum Note (Signed)
Addended by: Alfonzo Feller on: 09/16/2013 04:17 PM   Modules accepted: Orders

## 2013-09-16 NOTE — Telephone Encounter (Signed)
Last AEX: 06/24/12 Last refill:03/21/13 #30 X 5 Current AEX:09/23/13  Please advise

## 2013-09-16 NOTE — Telephone Encounter (Signed)
Rx printed, signed by Dr. Charlies Constable and faxed

## 2013-09-23 ENCOUNTER — Encounter: Payer: Self-pay | Admitting: Podiatry

## 2013-09-23 ENCOUNTER — Ambulatory Visit (INDEPENDENT_AMBULATORY_CARE_PROVIDER_SITE_OTHER): Payer: 59 | Admitting: Podiatry

## 2013-09-23 VITALS — BP 132/82 | HR 66 | Resp 18

## 2013-09-23 DIAGNOSIS — Z9889 Other specified postprocedural states: Secondary | ICD-10-CM

## 2013-09-23 NOTE — Progress Notes (Signed)
   Subjective:    Patient ID: Denise Rice, female    DOB: February 18, 1950, 63 y.o.   MRN: [id number]  HPI " I HOPE MY PIN COMES OUT TODAY AND IT IS DOING GOOD ON MY 2ND TOE ON MY LEFT FOOT"  Denise Rice returns the office today 4 weeks status post left second digit PIPJ fusion secondary to symptomatic hammertoe contracture. Since her last appointment she states that her pain has decreased. She has had no drainage/erythema from around the incision. Denies any systemic complaints such as fevers, chills, nausea, vomiting. She continues to the tape the second digit over to the third to help splint the toe. She continues with a surgical shoe. No new  complaints at this time.    Review of Systems  All other systems reviewed and are negative.      Objective:   Physical Exam AAO x3, NAD Neurovascular status unchanged. Immediate capillary refill time noted to all the digits occluding the left second digit. Incision over left second digit well healed at this time without any evidence of dehiscence. No surrounding erythema, drainage. K wire intact the left second digit without any drainage from the site. Toe appears to be in rectus alignment and good position. Mild edema around the digit mostly at the MTPJ. No calf pain swelling, warmth, pain.      Assessment & Plan:  63 year old female 4 weeks status post left second digit PIPJ arthrodesis with K wire fixation. -Discussed the patient at this time cannot remove the wire as it is only at 4 weeks. At next appointment we'll take x-rays and likely remove K wire at that time. -Continue with surgical shoe and taping of the digit to stabilize it. -Call with any questions or concerns or any change in symptoms. -Followup in 2 weeks or sooner if any problems are to arise.

## 2013-09-26 ENCOUNTER — Ambulatory Visit (INDEPENDENT_AMBULATORY_CARE_PROVIDER_SITE_OTHER): Payer: 59

## 2013-09-26 ENCOUNTER — Ambulatory Visit (INDEPENDENT_AMBULATORY_CARE_PROVIDER_SITE_OTHER): Payer: 59 | Admitting: Podiatry

## 2013-09-26 ENCOUNTER — Encounter: Payer: Self-pay | Admitting: Podiatry

## 2013-09-26 ENCOUNTER — Encounter: Payer: 59 | Admitting: Podiatry

## 2013-09-26 ENCOUNTER — Other Ambulatory Visit: Payer: Self-pay | Admitting: Podiatry

## 2013-09-26 VITALS — BP 132/84 | HR 77 | Resp 14

## 2013-09-26 DIAGNOSIS — M2040 Other hammer toe(s) (acquired), unspecified foot: Secondary | ICD-10-CM

## 2013-09-26 DIAGNOSIS — Z9889 Other specified postprocedural states: Secondary | ICD-10-CM

## 2013-09-26 MED ORDER — OXYCODONE-ACETAMINOPHEN 5-325 MG PO TABS: 1.0000 | ORAL_TABLET | Freq: Four times a day (QID) | ORAL | Status: DC | PRN

## 2013-09-26 NOTE — Patient Instructions (Signed)
Monitor for any signs/symptoms of infection. Call the office immediately if any occur or go directly to the emergency room. Call with any questions/concerns.  Wrap the toes daily to help prevent swelling. Call with any questions/concerns.

## 2013-09-26 NOTE — Progress Notes (Signed)
   Subjective:    Patient ID: Denise Rice, female    DOB: [birth year], 63 y.o.   MRN: [id number]  HPI Comments: DOS 08/29/2013 left 2nd hammer toe repair with pin. [name] presents today for an unscheduled appointment due to increased pain today to her left 2nd digit. She states she did not have any pain over the weekend, but woke up with discomfort today. Denies any fever, chills, nausea, vomiting. She has not been wrapping the toe as directed to help control the swelling.    Review of Systems  All other systems reviewed and are negative.      Objective:   Physical Exam AAO x3, NAD DP/PT pulse palpable 2/4 b/l. CRT < 3sec Neurological status unchanged Left 2nd digit with k-wire intact. Edema to the digit over the MTPJ/PIPJ. Incision well coapted without evidence of dehiscence. There is no erythema/drinage/signs of infection. Pain with ROM of the MTPJ.  MMT 5/5      Assessment & Plan:  63 year old female 4 weeks s/p L 2nd digit PIPJ arthrodesis with k-wire fixation; presents with increased pain likely secondary to increased edema -Treatment options discussed with the patient including all alternatives, risks, complications.  -Discussed she needs to wrap the toe daily to help control swelling. She has related an increase in pain as the swelling increases. Dispensed an anklet to also aid this.  -Discussed ROM exercises for the MTPJ to help prevent contracture/stiffness.  -F/U as scheduled. Would like to leave the pin in for 6 weeks. If problems continue then may consider removing the pin earlier.  -Monitor for any signs/symptoms of infection. Call with any questions/concerns.

## 2013-10-03 ENCOUNTER — Telehealth: Payer: Self-pay | Admitting: Podiatry

## 2013-10-03 NOTE — Telephone Encounter (Signed)
Received a message from Ms. Krall Sunday morning 10/02/13 at around 9am stating she got the k-wire caught in the bed and it is sticking out approx. 1/2 inch. She was going to pull the wire out herself. I directed her to meet me at the office and I would take the wire out if needed. I met her at the office at 10am on Sunday 10/02/13. She denies any pain and states her pain has much improved compared to last appointment. On evaluation the k-wire was almost completely out. There was mild edema to the left 2nd digit at the surgical site. The toe was not wrapped or splinted. DP/PT pulses palpable, CRT < 3sec. Neurological status unchanged. No pain with ROM of the MTPJ. Incision well coapted without any evidence of dehiscence. No surrounding erythema, ascending cellulitis, drainage. K-wire was removed intact and complete. Patient tolerated well. Applied antibiotic ointment and coflex splint. Directed to keep antibiotic ointment over the site of the k-wire until completely healed. She can start to get it wet once the site has completely healed. Continue to wrap the toe to help decrease the edema and splinting to ensure proper positioning as wire was removed before it should have. Continue surgical shoe. She had an appointment to follow up this Friday but since the wire was already removed, she can follow up in 2 weeks, or sooner if needed. Since the wire was removed on a Sunday, I will get x-rays next appointment. Directed to continue to monitor for any signs/symptoms of infection and directed to call immediately if any occur. Call with any questions/concerns/change in symptoms.

## 2013-10-04 ENCOUNTER — Encounter: Payer: Self-pay | Admitting: Gynecology

## 2013-10-04 ENCOUNTER — Telehealth: Payer: Self-pay | Admitting: Gynecology

## 2013-10-04 ENCOUNTER — Encounter: Payer: Self-pay | Admitting: Podiatry

## 2013-10-04 ENCOUNTER — Ambulatory Visit (INDEPENDENT_AMBULATORY_CARE_PROVIDER_SITE_OTHER): Payer: 59 | Admitting: Gynecology

## 2013-10-04 VITALS — BP 124/94 | HR 88 | Resp 12 | Ht 61.0 in

## 2013-10-04 DIAGNOSIS — Z01419 Encounter for gynecological examination (general) (routine) without abnormal findings: Secondary | ICD-10-CM

## 2013-10-04 DIAGNOSIS — F411 Generalized anxiety disorder: Secondary | ICD-10-CM

## 2013-10-04 DIAGNOSIS — K648 Other hemorrhoids: Secondary | ICD-10-CM

## 2013-10-04 DIAGNOSIS — G4700 Insomnia, unspecified: Secondary | ICD-10-CM

## 2013-10-04 DIAGNOSIS — M62830 Muscle spasm of back: Secondary | ICD-10-CM

## 2013-10-04 DIAGNOSIS — K649 Unspecified hemorrhoids: Secondary | ICD-10-CM

## 2013-10-04 MED ORDER — ZOLPIDEM TARTRATE 10 MG PO TABS: ORAL_TABLET | ORAL | Status: DC

## 2013-10-04 MED ORDER — ESCITALOPRAM OXALATE 10 MG PO TABS: 10.0000 mg | ORAL_TABLET | Freq: Every day | ORAL | Status: DC

## 2013-10-04 MED ORDER — ALPRAZOLAM 1 MG PO TABS: 1.0000 mg | ORAL_TABLET | Freq: Three times a day (TID) | ORAL | Status: DC | PRN

## 2013-10-04 MED ORDER — HYDROCORTISONE ACE-PRAMOXINE 1-1 % RE FOAM: 1.0000 | Freq: Two times a day (BID) | RECTAL | Status: DC

## 2013-10-04 NOTE — Progress Notes (Signed)
63 y.o. Married Caucasian female   G0P0 here for annual exam. Pt reports menses are absent due to Hysterectomy. She does not report hot flashes, she does have night sweats, does not have vaginal dryness.  She is not using lubricants.  She does not report post-menopasual bleeding.  Pt has had new onset of night sweats for the past 3-4w, pt reports head is wet.  Pt reports not feeling wart any longer.  Patient's last menstrual period was 01/13/2005.          Sexually active: Yes.    The current method of family planning is status post hysterectomy.    Exercising: Yes.    weights, aerobics 4x/wk Last pap: 2007-WNL  Abnormal PAP: yes 20 years ago- Dysplasia  Mammogram:  11/23/12 Bi-Rads 2; Left Breast US Bi-Rads 2 BSE: no Colonoscopy: 2010- Normal f/u 10 years DEXA: 2015; Age 63 Alcohol: no Tobacco: no  Labs: Rachell Cipro, MD   Health Maintenance  Topic Date Due  . Colonoscopy  11/16/2002  . Pap Smear  02/14/2003  . Tetanus/tdap  02/13/2005  . Zostavax  11/15/2012  . Influenza Vaccine  08/13/2013  . Mammogram  11/24/2014    Family History  Problem Relation Age of Onset  . Heart attack Mother   . Heart disease Mother   . Cancer Father     unknown primary  . Diabetes Maternal Aunt   . Heart disease Maternal Aunt   . Cancer Paternal Aunt   . Cancer Paternal Uncle   . Stroke Sister     Patient Active Problem List   Diagnosis Date Noted  . 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 Hearing loss sensory, bilateral 06/25/2012  . 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 Anal condyloma   . History of peptic ulcer   . Otosclerosis of left ear   . Hearing loss, sensorineural, high frequency     RIGHT EAR  . 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 PCO (polycystic ovaries)   . Wears hearing aid     RIGHT EAR  . Deafness in left ear   . PONV (postoperative nausea and vomiting)     Past Surgical History  Procedure Laterality  Date  . Vaginal hysterectomy  2007    fibroids  . Roux-en-y gastric bypass  AUG 2013  . Laparoscopic gastric banding  02-08-2007  . Strabismus surgery Right   . Laparoscopic cholecystectomy  03-31-2005  . Implantation bone anchored hearing aid Left 07-27-2008    left temporal bone  . Stapedes surgery Left 1985;  1994 x2;  02-03-1998  . Blepharoplasty  03-05-2000  . Laser ablation condolamata N/A 07/30/2012    Procedure: LASER ABLATION CONDOLAMATA;  Surgeon: Leighton Ruff, MD;  Location: [facility];  Service: General;  Laterality: N/A;  . Hammer toe surgery Left 08-29-2013    2nd toe    Allergies: Review of patient's allergies indicates no known allergies.  Current Outpatient Prescriptions  Medication Sig Dispense Refill  . Biotin 5000 MCG CAPS Take by mouth daily.      . Calcium Carbonate-Vitamin D (CALCIUM 600 + D PO) Take 2 tablets by mouth daily.      . clonazePAM (KLONOPIN) 1 MG tablet TAKE 1 TABLET BY MOUTH ONCE DAILY AS NEEDED FOR ANXIETY  30 tablet  0  . cyanocobalamin 2000 MCG tablet Take 2,000 mcg by mouth daily.      . cyclobenzaprine (FLEXERIL) 10  MG tablet Take 1 tablet (10 mg total) by mouth 3 (three) times daily as needed for muscle spasms.  30 tablet  0  . escitalopram (LEXAPRO) 10 MG tablet Take 10 mg by mouth daily.      . ferrous sulfate 325 (65 FE) MG tablet Take 325 mg by mouth daily with breakfast.      . hydrocortisone-pramoxine (PROCTOFOAM-HC) rectal foam Place 1 applicator rectally 2 (two) times daily.  10 g  0  . oxyCODONE-acetaminophen (ROXICET) 5-325 MG per tablet Take 1-2 tablets by mouth every 6 (six) hours as needed for severe pain.  30 tablet  0  . zolpidem (AMBIEN) 10 MG tablet TAKE 1 TABLET BY MOUTH AT BEDTIME AS NEEDED  90 tablet  0   No current facility-administered medications for this visit.    ROS: Pertinent items are noted in HPI.  Exam:    BP 124/94  Pulse 88  Resp 12  Ht 5\' 1"  (1.549 m)  LMP 01/13/2005 Weight change:  @WEIGHTCHANGE @ Last 3 height recordings:  Ht Readings from Last 3 Encounters:  10/04/13 5\' 1"  (1.549 m)  05/10/13 5' (1.524 m)  02/02/13 5\' 1"  (1.549 m)   General appearance: alert, cooperative and appears stated age Head: Normocephalic, without obvious abnormality, atraumatic Neck: no adenopathy, no carotid bruit, no JVD, supple, symmetrical, trachea midline and thyroid not enlarged, symmetric, no tenderness/mass/nodules Lungs: clear to auscultation bilaterally Breasts: No nipple retraction or dimpling, No nipple discharge or bleeding, No axillary or supraclavicular adenopathy, Normal to palpation without dominant masses, Taught monthly breast self examination Heart: regular rate and rhythm, S1, S2 normal, no murmur, click, rub or gallop Abdomen: soft, non-tender; bowel sounds normal; no masses,  no organomegaly Extremities: extremities normal, atraumatic, no cyanosis or edema Skin: Skin color, texture, turgor normal. No rashes or lesions Lymph nodes: Cervical, supraclavicular, and axillary nodes normal. no inguinal nodes palpated Neurologic: Grossly normal   Pelvic: External genitalia:  no lesions              Urethra: normal appearing urethra with no masses, tenderness or lesions              Bartholins and Skenes: Bartholin's, Urethra, Skene's normal                 Vagina: atrophic              Cervix: absent              Pap taken: No.        Bimanual Exam:  Uterus:  absent                                      Adnexa:    no masses                                      Rectovaginal: Confirms, wart absent                                      Anus:  normal sphincter tone, no lesions, non-inflamed hemorrhoids        1. Encounter for routine gynecological examination mammogram counseled on breast self exam, adequate intake of calcium and vitamin D, diet and exercise return  annually or prn Discussed PAP guideline changes, importance of weight bearing exercises, calcium, vit D  and balanced diet  2. Other hemorrhoids  - hydrocortisone-pramoxine (PROCTOFOAM-HC) rectal foam; Place 1 applicator rectally 2 (two) times daily.  Dispense: 10 g; Refill: 1  3. Anxiety state, unspecified Pt reports sleepy with clonazapam would like to try shorter acting anti-anxiety - escitalopram (LEXAPRO) 10 MG tablet; Take 1 tablet (10 mg total) by mouth daily.  Dispense: 90 tablet; Refill: 3 - ALPRAZolam (XANAX) 1 MG tablet; Take 1 tablet (1 mg total) by mouth 3 (three) times daily as needed for anxiety.  Dispense: 90 tablet; Refill: 1  4. Insomnia Most often can use 1/2, but cannot sleep without, aware of tolerance - zolpidem (AMBIEN) 10 MG tablet; TAKE 1 TABLET BY MOUTH AT BEDTIME AS NEEDED  Dispense: 90 tablet; Refill: 1  An After Visit Summary was printed and given to the patient.

## 2013-10-05 MED ORDER — CYCLOBENZAPRINE HCL 10 MG PO TABS: 10.0000 mg | ORAL_TABLET | Freq: Three times a day (TID) | ORAL | Status: DC | PRN

## 2013-10-05 NOTE — Telephone Encounter (Signed)
RF sent to pharmacy.  Thanks.

## 2013-10-07 ENCOUNTER — Encounter: Payer: 59 | Admitting: Podiatry

## 2013-10-11 ENCOUNTER — Ambulatory Visit: Payer: 59 | Admitting: Gynecology

## 2013-10-14 ENCOUNTER — Encounter: Payer: Self-pay | Admitting: Podiatry

## 2013-10-14 ENCOUNTER — Ambulatory Visit (INDEPENDENT_AMBULATORY_CARE_PROVIDER_SITE_OTHER): Payer: 59 | Admitting: Podiatry

## 2013-10-14 ENCOUNTER — Ambulatory Visit (INDEPENDENT_AMBULATORY_CARE_PROVIDER_SITE_OTHER): Payer: 59

## 2013-10-14 VITALS — BP 125/74 | HR 74 | Resp 17

## 2013-10-14 DIAGNOSIS — Z9889 Other specified postprocedural states: Secondary | ICD-10-CM

## 2013-10-15 NOTE — Progress Notes (Signed)
Patient ID: Denise Rice, female   DOB: 26-Jul-1950, 63 y.o.   MRN: [id number]  Subjective: [name] returns the office they for followup evaluation status post left second digit PIPJ fusion with K wire fixation of the day of surgery of 08/29/2013. She states that she is doing well and has had decreased pain since last appointment. Also states that her swelling has decreased. She is continuing with a surgical shoe and buddy taping to the third digit. She has not been performing range of motion activities. Denies any systemic complaints such as fevers, chills, nausea, vomiting. No other complaints at this time.  Objective: AAO x3, NAD DP/PT pulses palpable 2/4 b/l. CRT < 3 sec to all digits Protective sensation intact with Derrel Nip monofilament. Left second digit appears to be in a rectus position both nonweightbearing and weightbearing. They're significant decrease in edema compared to last visit. No overlying erythema, drainage, increased warmth. Incision is well healed at this time. There is a slight contracture of the scar along the MPJ however toe is in good position. No open lesions. No calf pain, swelling, warmth.  Assessment: 63 year old female status post left second digit hammertoe correction with PIPJ fusion DOS 08/29/13  Plan: -On x-ray does appear that the middle and distal phalanx are shifted laterally compared with the proximal phalanx. However the digit clinically is in great position. Discussed that shift likely a result of the K wire been removed early. -At this time patient can slowly transition back into a tennis shoe and recommended not to go into a flat shoe. Discussed with her that if she has pain that she should go back into the surgical shoe. Continue with icing the area as well as splinting the toe to help control the position as well as the edema. -Recommended vitamin E cream to massage into the scar to help break up some scar tissue. Also recommended to continue with  range of motion activities of the MPJ which I again demonstrated to her today -Followup in one month or sooner if any problems are to arise or any change in symptoms. In the meantime call the office with any questions, concerns.

## 2013-10-28 ENCOUNTER — Other Ambulatory Visit: Payer: Self-pay

## 2013-11-11 ENCOUNTER — Ambulatory Visit (INDEPENDENT_AMBULATORY_CARE_PROVIDER_SITE_OTHER): Payer: 59 | Admitting: Podiatry

## 2013-11-11 ENCOUNTER — Ambulatory Visit (INDEPENDENT_AMBULATORY_CARE_PROVIDER_SITE_OTHER): Payer: 59

## 2013-11-11 ENCOUNTER — Encounter: Payer: Self-pay | Admitting: Podiatry

## 2013-11-11 VITALS — BP 126/76 | HR 68 | Resp 12

## 2013-11-11 DIAGNOSIS — Z9889 Other specified postprocedural states: Secondary | ICD-10-CM

## 2013-11-11 DIAGNOSIS — M2042 Other hammer toe(s) (acquired), left foot: Secondary | ICD-10-CM

## 2013-11-14 NOTE — Progress Notes (Signed)
Patient ID: Denise Rice, female   DOB: 14-Feb-1950, 63 y.o.   MRN: [id number]  Subjective: Denise Rice returns to the office today for follow up evaluation of left 2nd digit PIPJ arthrodesis with k-wire fixation (DOS: 08/29/13). She states that she is back in a normal shoe, and presents today wearing a boot style shoe. She does state that it "aches" periodically she has some mild swelling over the "knuckle bone", but it is better than it was. No pain/discomfort over the scar. No new complaints at this time. Denies any acute changes as well as appointment. Denies any systemic complaints of fevers, chills, nausea, vomiting.  Objective: AAO x3, NAD DP/PT pulses palpable bilaterally, CRT less than 3 seconds Protective sensation intact with Simms Weinstein monofilament, vibratory sensation intact, Achilles tendon reflex intact Left second digit with overlying scar well-healed at this time. There is no surrounding erythema, drainage, increased warmth.there is mild edema over the PIPJ arthrodesis site. Significant decrease in edema along the MPJ. There is no pain or tenderness along the course of the scar. Toe is in rectus alignment. No calf pain, swelling, warmth, erythema  Assessment: 63 year old female status post left second digit PIPJ with arthrodesis.  Plan: -X-rays were obtained and reviewed with the patient. -Conservative versus surgical treatment discussed including alternatives, risks, complications. -Continuing with bandaging as needed to help control the edema and the alignment. Discussed with her the x-ray has not completely healed at the arthrodesis site. However given the timeframe post surgery can return to normal activities as tolerated. Continue to monitor the alignment of the toe as well as the swelling. Follow up as needed however call with any questions, concerns, change in symptoms.

## 2014-01-11 ENCOUNTER — Ambulatory Visit (INDEPENDENT_AMBULATORY_CARE_PROVIDER_SITE_OTHER): Payer: 59 | Admitting: Gynecology

## 2014-01-11 ENCOUNTER — Encounter: Payer: Self-pay | Admitting: Gynecology

## 2014-01-11 VITALS — BP 130/80 | HR 70 | Temp 96.3°F | Resp 12

## 2014-01-11 DIAGNOSIS — J018 Other acute sinusitis: Secondary | ICD-10-CM

## 2014-01-11 MED ORDER — CEFUROXIME AXETIL 500 MG PO TABS: ORAL_TABLET | ORAL | Status: DC

## 2014-01-11 NOTE — Progress Notes (Signed)
   63 year old patient had presented today complaining of sinus pressure. Patient recently had returned home from a flight to [facility]. She has not had any temperature elevations reported. She denied any chills. Minimal coughing. Mostly postnasal drip. Patient has history of hearing loss bilateral and has cochlear implants in place.  Exam: Lungs: Clear to auscultation rhonchus or wheezes HEENT: No submandibular lymphadenopathy. Tender frontal and maxillary sinuses bilateral Heart: Regular rate and rhythm no murmurs or gallops  Assessment/plan: Acute sinusitis will be treated with Ceftin 500 mg one by mouth twice a day for 7 days. Patient also will buy over-the-counter Mucinex to take when necessary.

## 2014-01-11 NOTE — Patient Instructions (Signed)

## 2014-01-16 ENCOUNTER — Other Ambulatory Visit: Payer: Self-pay | Admitting: *Deleted

## 2014-01-16 DIAGNOSIS — F411 Generalized anxiety disorder: Secondary | ICD-10-CM

## 2014-01-16 MED ORDER — ALPRAZOLAM 1 MG PO TABS: 1.0000 mg | ORAL_TABLET | Freq: Three times a day (TID) | ORAL | Status: DC | PRN

## 2014-01-16 NOTE — Telephone Encounter (Signed)
Rx faxed to [facility]

## 2014-01-16 NOTE — Telephone Encounter (Signed)
Medication refill request: Xanax Last AEX:  10/04/13 Next AEX: 10/20/14 Last MMG (if hormonal medication request):  BIRADS0:Incomplete 11/23/2012 BIRADS2:Benign  Refill authorized: 10/04/13 #90/1R. Today ?

## 2014-01-20 ENCOUNTER — Telehealth: Payer: Self-pay

## 2014-01-20 MED ORDER — AMOXICILLIN-POT CLAVULANATE 875-125 MG PO TABS: 1.0000 | ORAL_TABLET | Freq: Two times a day (BID) | ORAL | Status: DC

## 2014-01-20 NOTE — Telephone Encounter (Signed)
Spoke with [facility] pharmacist at [facility] who states patient is there trying to pick up prescription for Augmentin. Brayton Layman states there are two patient's by this name and she received an rx for the other patient this morning. Advised would speak with Dr.Miller and return call. Spoke with Gay Filler who states okay to call in prescription. Prescription called into pharmacy for Augmentin 875 BID for ten days #20 [id number]. Will fill rx now. Rx entered into EPIC as called in.   Routing to [place] for review and signature.

## 2014-03-01 ENCOUNTER — Other Ambulatory Visit (HOSPITAL_COMMUNITY): Payer: Self-pay | Admitting: Family Medicine

## 2014-03-01 DIAGNOSIS — Z1231 Encounter for screening mammogram for malignant neoplasm of breast: Secondary | ICD-10-CM

## 2014-03-13 ENCOUNTER — Ambulatory Visit (HOSPITAL_COMMUNITY)
Admission: RE | Admit: 2014-03-13 | Discharge: 2014-03-13 | Disposition: A | Discharge status: Home / Self Care | Payer: 59 | Source: Ambulatory Visit | Attending: Family Medicine | Admitting: Family Medicine

## 2014-03-13 ENCOUNTER — Other Ambulatory Visit (HOSPITAL_COMMUNITY): Payer: Self-pay | Admitting: Family Medicine

## 2014-03-13 DIAGNOSIS — Z1231 Encounter for screening mammogram for malignant neoplasm of breast: Secondary | ICD-10-CM

## 2014-03-14 ENCOUNTER — Telehealth: Payer: Self-pay | Admitting: *Deleted

## 2014-03-14 NOTE — Telephone Encounter (Signed)
Patient calling to check on MMG result from yesterday. Reports really anxious about result due to previous recall last year. Request call when result available.

## 2014-03-15 NOTE — Telephone Encounter (Signed)
MMG was done at [facility] on 03-13-14 and appeared to still be pending when I checked yesterday when she called. It has Dr Ernie Hew listed as resulting physician, patients PCP. I have called [facility] for results. It is still pending. Called to [facility] to speak with radiologist, left message to call back.

## 2014-03-15 NOTE — Telephone Encounter (Signed)
Call received from   [place] at [facility]. MMG  has been read and report released in EPIC.

## 2014-03-15 NOTE — Telephone Encounter (Signed)
This wasn't in my faxed records yesterday.  Can you see if came in today and let Mrs. Brase know results.  Thanks.

## 2014-03-15 NOTE — Telephone Encounter (Signed)
Call to patient, notified MMG results  Bi Rads1. Screening MMG due in 1 year. Patient aware Dr Sabra Heck will review and we will call if additional recommendations.  Routing to provider for final review.

## 2014-03-16 NOTE — Telephone Encounter (Signed)
MMG in my in box today and is negative.  OK to close encounter.

## 2014-03-29 ENCOUNTER — Other Ambulatory Visit: Payer: Self-pay | Admitting: Obstetrics & Gynecology

## 2014-03-29 NOTE — Telephone Encounter (Signed)
Medication refill request: Xanax 1 mg  Last AEX:  10/04/13 with TL Next AEX: 10/20/14 with SM Last MMG (if hormonal medication request): N/A Refill authorized: Please advise.

## 2014-03-30 NOTE — Telephone Encounter (Signed)
RX printed, signed by Dr. Sabra Heck and faxed to [facility].

## 2014-04-17 ENCOUNTER — Encounter (HOSPITAL_COMMUNITY): Payer: Self-pay | Admitting: Emergency Medicine

## 2014-04-17 ENCOUNTER — Emergency Department (INDEPENDENT_AMBULATORY_CARE_PROVIDER_SITE_OTHER): Payer: 59

## 2014-04-17 ENCOUNTER — Emergency Department (INDEPENDENT_AMBULATORY_CARE_PROVIDER_SITE_OTHER)
Admission: EM | Admit: 2014-04-17 | Discharge: 2014-04-17 | Disposition: A | Discharge status: Home / Self Care | Payer: 59 | Source: Home / Self Care | Attending: Family Medicine | Admitting: Family Medicine

## 2014-04-17 DIAGNOSIS — S8001XA Contusion of right knee, initial encounter: Secondary | ICD-10-CM

## 2014-04-17 DIAGNOSIS — M7051 Other bursitis of knee, right knee: Secondary | ICD-10-CM

## 2014-04-17 MED ORDER — PANTOPRAZOLE SODIUM 40 MG PO TBEC: 40.0000 mg | DELAYED_RELEASE_TABLET | Freq: Every day | ORAL | Status: DC

## 2014-04-17 NOTE — ED Provider Notes (Signed)
Denise Rice is a 64 y.o. female who presents to Urgent Care today for Fall and right knee pain. Patient has a history of vertigo and has had frequent falls recently. The most recent fall was 2 days ago when she fell on her anterior knees. The right knee is much worse than the left. She notes significant pain along the anterior aspect of the knee especially with ambulation. She denies any radiating pain weakness or numbness. She notes small abrasion to the anterior knees. The abrasion occurred through long pants. She thinks her last tetanus vaccine was less than 5 years ago. She has tried ibuprofen for pain. She denies a history of hypertension. No chest pain or palpitations.   Past Medical History  Diagnosis Date  . Anal condyloma   . History of peptic ulcer   . Otosclerosis of left ear   . Hearing loss, sensorineural, high frequency     RIGHT EAR  . 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 PCO (polycystic ovaries)   . Wears hearing aid     RIGHT EAR  . Deafness in left ear   . PONV (postoperative nausea and vomiting)    Past Surgical History  Procedure Laterality Date  . Vaginal hysterectomy  2007    fibroids  . Roux-en-y gastric bypass  AUG 2013  . Laparoscopic gastric banding  02-08-2007  . Strabismus surgery Right   . Laparoscopic cholecystectomy  03-31-2005  . Implantation bone anchored hearing aid Left 07-27-2008    left temporal bone  . Stapedes surgery Left 1985;  1994 x2;  02-03-1998  . Blepharoplasty  03-05-2000  . Laser ablation condolamata N/A 07/30/2012    Procedure: LASER ABLATION CONDOLAMATA;  Surgeon: Leighton Ruff, MD;  Location: [facility];  Service: General;  Laterality: N/A;  . Hammer toe surgery Left 08-29-2013    2nd toe   History  Substance Use Topics  . Smoking status: Never Smoker   . Smokeless tobacco: Never Used  . Alcohol Use: No   ROS as above Medications: No current facility-administered medications for this  encounter.   Current Outpatient Prescriptions  Medication Sig Dispense Refill  . ALPRAZolam (XANAX) 1 MG tablet TAKE 1 TABLET BY MOUTH 3 TIMES DAILY AS NEEDED FOR ANXIETY 90 tablet 1  . amoxicillin-clavulanate (AUGMENTIN) 875-125 MG per tablet Take 1 tablet by mouth 2 (two) times daily. For ten days 20 tablet 0  . Biotin 5000 MCG CAPS Take by mouth daily.    . Calcium Carbonate-Vitamin D (CALCIUM 600 + D PO) Take 2 tablets by mouth daily.    . cefUROXime (CEFTIN) 500 MG tablet Take 1 by mouth twice a day 14 tablet 0  . clonazePAM (KLONOPIN) 1 MG tablet TAKE 1 TABLET BY MOUTH ONCE DAILY AS NEEDED FOR ANXIETY 30 tablet 0  . cyanocobalamin 2000 MCG tablet Take 2,000 mcg by mouth daily.    . cyclobenzaprine (FLEXERIL) 10 MG tablet Take 1 tablet (10 mg total) by mouth 3 (three) times daily as needed for muscle spasms. 30 tablet 2  . escitalopram (LEXAPRO) 10 MG tablet Take 1 tablet (10 mg total) by mouth daily. 90 tablet 3  . ferrous sulfate 325 (65 FE) MG tablet Take 325 mg by mouth daily with breakfast.    . hydrocortisone-pramoxine (PROCTOFOAM-HC) rectal foam Place 1 applicator rectally 2 (two) times daily. 10 g 1  . oxyCODONE-acetaminophen (ROXICET) 5-325 MG per tablet Take 1-2 tablets by mouth every 6 (six) hours as needed  for severe pain. 30 tablet 0  . pantoprazole (PROTONIX) 40 MG tablet Take 1 tablet (40 mg total) by mouth daily. 30 tablet 0  . zolpidem (AMBIEN) 10 MG tablet TAKE 1 TABLET BY MOUTH AT BEDTIME AS NEEDED 90 tablet 1   No Known Allergies   Exam:  BP 167/99 mmHg  Pulse 68  Temp(Src) 98.2 F (36.8 C) (Oral)  Resp 14  SpO2 100%  LMP 01/13/2005 Gen: Well NAD Right leg: Hip nontender normal motion Knee abrasion anteriorly. No significant effusion. Range of motion 0-120 with 1+ retropatellar crepitations on extension. Tender palpation anterior knee with palpable squeak. Stable ligamentous exam. Pulses capillary refill and sensation intact distally  Left leg: Hip  nontender normal motion Left knee ecchymosis present anteriorly. nontender normal range of motion stable ligamentous exam. Pulses capillary refill sensation intact distally.   No results found for this or any previous visit (from the past 24 hour(s)). Dg Knee Complete 4 Views Right  04/17/2014   CLINICAL DATA:  Recent fall with anterior knee pain, initial encounter  EXAM: RIGHT KNEE - COMPLETE 4+ VIEW  COMPARISON:  None.  FINDINGS: No acute fracture or dislocation is noted. Mild spurring is noted arising from the patella. No gross soft tissue abnormality is noted.  IMPRESSION: No acute abnormality noted.   Electronically Signed   By: Inez Catalina M.D.   On: 04/17/2014 09:36    Assessment and Plan: 64 y.o. female with right knee pain following fall and contusion. Likely prepatellar bursitis.  Patient does have a slight lucency on the AP knee x-ray. This overlies the tibia. She however didn't does not have any effusion. Tibial plateau fracture is very unlikely. Discussed with Dr. Golden Circle.  Plan to continue NSAIDs relative rest iced and elevation. Follow-up with orthopedics. If symptoms worsen or patient develops an effusion transition to nonweightbearing status and follow-up with orthopedics ASAP.  Discussed warning signs or symptoms. Please see discharge instructions. Patient expresses understanding.     Gregor Hams, MD 04/17/14 1017

## 2014-04-17 NOTE — Discharge Instructions (Signed)
Thank you for coming in today. Take up to 2 aleve twice daily for pain.  Use OTC aspercream as needed as well.  Follow up with orthopedics or sports medicine.  If the pain gets worse or the knee has more swelling do not walk on it. Get some crutches and follow up with sports medicine or orthopedics.   Prepatellar Bursitis with Rehab  Bursitis is a condition that is characterized by inflammation of a bursa. Saunders Revel exists in many areas of the body. They are fluid-filled sacs that lie between a soft tissue (skin, tendon, or ligament) and a bone, and they reduce friction between the structures as well as the stress placed on the soft tissue. Prepatellar bursitis is inflammation of the bursa that lies between the skin and the kneecap (patella). This condition often causes pain over the patella. SYMPTOMS   Pain, tenderness, and/or inflammation over the patella.  Pain that worsens with movement of the knee joint.  Decreased range of motion for the knee joint.  A crackling sound (crepitation) when the bursa is moved or touched.  Occasionally, painless swelling of the bursa.  Fever (when infected). CAUSES  Bursitis is caused by damage to the bursa, which results in an inflammatory response. Common mechanisms of injury include:  Direct trauma to the front of the knee.  Repetitive and/or stressful use of the knee. RISK INCREASES WITH:  Activities in which kneeling and/or falling on one's knees is likely (volleyball or football).  Repetitive and stressful training, especially if it involves running on hills.  Improper training techniques, such as a sudden increase in the intensity, frequency, or duration of training.  Failure to warm up properly before activity.  Poor technique.  Artificial turf. PREVENTION   Avoid kneeling or falling on your knees.  Warm up and stretch properly before activity.  Allow for adequate recovery between workouts.  Maintain physical  fitness:  Strength, flexibility, and endurance.  Cardiovascular fitness.  Learn and use proper technique. When possible, have a coach correct improper technique.  Wear properly fitted and padded protective equipment (kneepads). PROGNOSIS  If treated properly, then the symptoms of prepatellar bursitis usually resolve within 2 weeks. RELATED COMPLICATIONS   Recurrent symptoms that result in a chronic problem.  Prolonged healing time, if improperly treated or reinjured.  Limited range of motion.  Infection of bursa.  Chronic inflammation or scarring of bursa. TREATMENT  Treatment initially involves the use of ice and medication to help reduce pain and inflammation. The use of strengthening and stretching exercises may help reduce pain with activity, especially those of the quadriceps and hamstring muscles. These exercises may be performed at home or with referral to a therapist. Your caregiver may recommend kneepads when you return to playing sports, in order to reduce the stress on the prepatellar bursa. If symptoms persist despite treatment, then your caregiver may drain fluid out with a needle (aspirate) the bursa. If symptoms persist for greater than 6 months despite nonsurgical (conservative) treatment, then surgery may be recommended to remove the bursa.  MEDICATION  If pain medication is necessary, then nonsteroidal anti-inflammatory medications, such as aspirin and ibuprofen, or other minor pain relievers, such as acetaminophen, are often recommended.  Do not take pain medication for 7 days before surgery.  Prescription pain relievers may be given if deemed necessary by your caregiver. Use only as directed and only as much as you need.  Corticosteroid injections may be given by your caregiver. These injections should be reserved for the most  serious cases, because they may only be given a certain number of times. HEAT AND COLD  Cold treatment (icing) relieves pain and reduces  inflammation. Cold treatment should be applied for 10 to 15 minutes every 2 to 3 hours for inflammation and pain and immediately after any activity that aggravates your symptoms. Use ice packs or massage the area with a piece of ice (ice massage).  Heat treatment may be used prior to performing the stretching and strengthening activities prescribed by your caregiver, physical therapist, or athletic trainer. Use a heat pack or soak the injury in warm water. SEEK MEDICAL CARE IF:  Treatment seems to offer no benefit, or the condition worsens.  Any medications produce adverse side effects. EXERCISES RANGE OF MOTION (ROM) AND STRETCHING EXERCISES - Prepatellar Bursitis These exercises may help you when beginning to rehabilitate your injury. Your symptoms may resolve with or without further involvement from your physician, physical therapist or athletic trainer. While completing these exercises, remember:   Restoring tissue flexibility helps normal motion to return to the joints. This allows healthier, less painful movement and activity.  An effective stretch should be held for at least 30 seconds.  A stretch should never be painful. You should only feel a gentle lengthening or release in the stretched tissue. STRETCH - Hamstrings, Standing  Stand or sit and extend your right / left leg, placing your foot on a chair or foot stool  Keeping a slight arch in your low back and your hips straight forward.  Lead with your chest and lean forward at the waist until you feel a gentle stretch in the back of your right / left knee or thigh. (When done correctly, this exercise requires leaning only a small distance.)  Hold this position for __________ seconds. Repeat __________ times. Complete this stretch __________ times per day. STRETCH - Quadriceps, Prone   Lie on your stomach on a firm surface, such as a bed or padded floor.  Bend your right / left knee and grasp your ankle. If you are unable to  reach, your ankle or pant leg, use a belt around your foot to lengthen your reach.  Gently pull your heel toward your buttocks. Your knee should not slide out to the side. You should feel a stretch in the front of your thigh and/or knee.  Hold this position for __________ seconds. Repeat __________ times. Complete this stretch __________ times per day.  STRETCH - Hamstrings/Adductors, V-Sit   Sit on the floor with your legs extended in a large "V," keeping your knees straight.  With your head and chest upright, bend at your waist reaching for your right foot to stretch your left adductors.  You should feel a stretch in your left inner thigh. Hold for __________ seconds.  Return to the upright position to relax your leg muscles.  Continuing to keep your chest upright, bend straight forward at your waist to stretch your hamstrings.  You should feel a stretch behind both of your thighs and/or knees. Hold for __________ seconds.  Return to the upright position to relax your leg muscles.  Repeat steps 2 through 4. Repeat __________ times. Complete this exercise __________ times per day.  STRENGTHENING EXERCISES - Prepatellar Bursitis  These exercises may help you when beginning to rehabilitate your injury. They may resolve your symptoms with or without further involvement from your physician, physical therapist or athletic trainer. While completing these exercises, remember:  Muscles can gain both the endurance and the strength needed for  everyday activities through controlled exercises.  Complete these exercises as instructed by your physician, physical therapist or athletic trainer. Progress the resistance and repetitions only as guided. STRENGTH - Quadriceps, Isometrics  Lie on your back with your right / left leg extended and your opposite knee bent.  Gradually tense the muscles in the front of your right / left thigh. You should see either your kneecap slide up toward your hip or  increased dimpling just above the knee. This motion will push the back of the knee down toward the floor/mat/bed on which you are lying.  Hold the muscle as tight as you can without increasing your pain for __________ seconds.  Relax the muscles slowly and completely in between each repetition. Repeat __________ times. Complete this exercise __________ times per day.  STRENGTH - Quadriceps, Short Arcs   Lie on your back. Place a __________ inch towel roll under your knee so that the knee slightly bends.  Raise only your lower leg by tightening the muscles in the front of your thigh. Do not allow your thigh to rise.  Hold this position for __________ seconds. Repeat __________ times. Complete this exercise __________ times per day.  OPTIONAL ANKLE WEIGHTS: Begin with ____________________, but DO NOT exceed ____________________. Increase in1 lb/0.5 kg increments.  STRENGTH - Quadriceps, Straight Leg Raises  Quality counts! Watch for signs that the quadriceps muscle is working to insure you are strengthening the correct muscles and not "cheating" by substituting with healthier muscles.  Lay on your back with your right / left leg extended and your opposite knee bent.  Tense the muscles in the front of your right / left thigh. You should see either your kneecap slide up or increased dimpling just above the knee. Your thigh may even quiver.  Tighten these muscles even more and raise your leg 4 to 6 inches off the floor. Hold for __________ seconds.  Keeping these muscles tense, lower your leg.  Relax the muscles slowly and completely in between each repetition. Repeat __________ times. Complete this exercise __________ times per day.  STRENGTH - Quadriceps, Step-Ups   Use a thick book, step or step stool that is __________ inches tall.  Holding a wall or counter for balance only, not support.  Slowly step-up with your right / left foot, keeping your knee in line with your hip and foot.  Do not allow your knee to bend so far that you cannot see your toes.  Slowly unlock your knee and lower yourself to the starting position. Your muscles, not gravity, should lower you. Repeat __________ times. Complete this exercise __________ times per day. Document Released: 12/30/2004 Document Revised: 05/16/2013 Document Reviewed: 04/13/2008 [facility] Patient Information 2015 [place], [place]. This information is not intended to replace advice given to you by your health care provider. Make sure you discuss any questions you have with your health care provider.

## 2014-04-17 NOTE — ED Notes (Signed)
Patient c/o dizziness from her vertigo causing her to fall three times in the last 3 weeks. Patient reports the most recent fall she fell on asphalt and has bilateral knee abrasions. Pain is worse in the right knee than in the left. Patient is in NAD.

## 2014-04-18 ENCOUNTER — Other Ambulatory Visit: Payer: Self-pay | Admitting: *Deleted

## 2014-04-18 DIAGNOSIS — G4700 Insomnia, unspecified: Secondary | ICD-10-CM

## 2014-04-18 MED ORDER — ZOLPIDEM TARTRATE 10 MG PO TABS: ORAL_TABLET | ORAL | Status: DC

## 2014-04-18 NOTE — Telephone Encounter (Signed)
Rx faxed to [facility]

## 2014-04-18 NOTE — Telephone Encounter (Signed)
Medication refill request: Zolpidem 10 mg Last AEX:  10/04/13 TL Next AEX: 10/20/14 SM Last MMG (if hormonal medication request): 03/15/14 [id number] Refill authorized: 10/04/13 #[id number]. Today please advise.

## 2014-04-21 ENCOUNTER — Encounter: Payer: Self-pay | Admitting: Neurology

## 2014-04-21 ENCOUNTER — Telehealth: Payer: Self-pay | Admitting: *Deleted

## 2014-04-21 ENCOUNTER — Ambulatory Visit (INDEPENDENT_AMBULATORY_CARE_PROVIDER_SITE_OTHER): Payer: 59 | Admitting: Neurology

## 2014-04-21 VITALS — BP 136/99 | HR 67 | Temp 97.1°F | Ht 61.5 in | Wt 178.0 lb

## 2014-04-21 DIAGNOSIS — H838X2 Other specified diseases of left inner ear: Secondary | ICD-10-CM | POA: Diagnosis not present

## 2014-04-21 DIAGNOSIS — G43109 Migraine with aura, not intractable, without status migrainosus: Secondary | ICD-10-CM | POA: Diagnosis not present

## 2014-04-21 DIAGNOSIS — H9193 Unspecified hearing loss, bilateral: Secondary | ICD-10-CM | POA: Diagnosis not present

## 2014-04-21 DIAGNOSIS — R42 Dizziness and giddiness: Secondary | ICD-10-CM

## 2014-04-21 MED ORDER — TOPIRAMATE ER 25 MG PO CAP24: 25.0000 mg | ORAL_CAPSULE | Freq: Every day | ORAL | Status: DC

## 2014-04-21 NOTE — Patient Instructions (Signed)
Overall you are doing fairly well but I do want to suggest a few things today:   Remember to drink plenty of fluid, eat healthy meals and do not skip any meals. Try to eat protein with a every meal and eat a healthy snack such as fruit or nuts in between meals. Try to keep a regular sleep-wake schedule and try to exercise daily, particularly in the form of walking, 20-30 minutes a day, if you can.   As far as your medications are concerned, I would like to suggest: Trokendi XR 25mg  in the evenings  As far as diagnostic testing: MRI brain  I would like to see you back in 3 months, sooner if we need to. Please call us with any interim questions, concerns, problems, updates or refill requests.   Please also call us for any test results so we can go over those with you on the phone.  My clinical assistant and will answer any of your questions and relay your messages to me and also relay most of my messages to you.   Our phone number is [phone]. We also have an after hours call service for urgent matters and there is a physician on-call for urgent questions. For any emergencies you know to call 911 or go to the nearest emergency room

## 2014-04-21 NOTE — Telephone Encounter (Signed)
Dr Jaynee Eagles requested records from Dr Thornell Mule faxed release 04-21-14.

## 2014-04-21 NOTE — Progress Notes (Addendum)
[facility]    Provider:  Dr Jaynee Eagles Referring Provider: Fanny Bien, MD Primary Care Physician:  Rachell Cipro, MD  CC:  Vertigo and dizziness  HPI:  Denise Rice is a 64 y.o. female here as a referral from Dr. Ernie Hew for vertigo and dizziness  She is hearing impaied. 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 She is deaf in the left ear. [name] is an ENT. She has a cochlear implant. It started last May, she had a concussion. She fell and hit her head on a concrete shower floor. She has fallen three times. Her depth perception is off. She doesn't realize how close she is to things. The dizziness feels like she is moving but she is not, if she gets up too quickly she feels dizzy, if she is standing still she will lose balance. She has almost toppled off the toilet bowl. She feels lightheadedness. She is off balance, off kilter, she can't run because the horizon makes her feel dizzy. She has had hearing problems for over 30 years years. 2010 with the sudden seosorineural hearing loss. Dizziness about 10 years ago. Worsening since last May. She lives with these sensations. She can drive, doesn't affect her. No FHx migraines. She fell and hit her head last May, she is unsure if she lost consciousness. She went to the ED, had nausea andvomiting, looked like she was in a prize fight, denies post concussive syndrome. Her mother was deaf in one ear as well.   Review of Systems: Patient complains of symptoms per HPI as well as the following symptoms: hearing loss, ringing in ears, spinning sensation, dizziness, depression, anxiety. Pertinent negatives per HPI. All others negative.   History   Social History  . Marital Status: Married    Spouse Name: Morene Antu   . Number of Children: 1  . Years of Education: MBA   Occupational History  . 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 Smoking status: Never Smoker   . Smokeless tobacco: Never Used  . Alcohol Use: 0.0 oz/week    0 Standard drinks or equivalent per week     Comment: 1 glass wine/6 months  . Drug Use: No  . Sexual Activity: Not on file   Other Topics Concern  . Not on file   Social History Narrative   Lives at home with husband and daughter.   Left handed.   Caffeine use: 2-3 cups (coffee) per day     Family History  Problem Relation Age of Onset  . Heart attack Mother   . Heart disease Mother   . Cancer Father     unknown primary  . Diabetes Maternal Aunt   . Heart disease Maternal Aunt   . Cancer Paternal Aunt   . Cancer Paternal Uncle   . Stroke Sister     Past Medical History  Diagnosis Date  . Anal condyloma   . History of peptic ulcer   . Otosclerosis of left ear   . Hearing loss, sensorineural, high frequency     RIGHT EAR  . 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 PCO (polycystic ovaries)   . Wears hearing aid     RIGHT EAR  . Deafness in left ear   . PONV (postoperative nausea and vomiting)     Past Surgical History  Procedure Laterality Date  . Vaginal hysterectomy  2007    fibroids  . Roux-en-y gastric bypass  AUG 2013  . Laparoscopic gastric banding  02-08-2007  . Strabismus surgery Right   . Laparoscopic cholecystectomy  03-31-2005  . Implantation bone anchored hearing aid Left 07-27-2008    left temporal bone  . Stapedes surgery Left 1985;  1994 x2;  02-03-1998  . Blepharoplasty  03-05-2000  . Laser ablation condolamata N/A 07/30/2012    Procedure: LASER ABLATION CONDOLAMATA;  Surgeon: Leighton Ruff, MD;  Location: [facility];  Service: General;  Laterality: N/A;  . Hammer toe surgery Left 08-29-2013    2nd toe  . Lasik Bilateral 2002    Current Outpatient Prescriptions  Medication Sig Dispense Refill  . ALPRAZolam (XANAX) 1 MG tablet TAKE 1 TABLET BY MOUTH 3 TIMES DAILY AS NEEDED FOR ANXIETY 90 tablet 1  . Biotin 5000  MCG CAPS Take by mouth daily.    . Calcium Carbonate-Vitamin D (CALCIUM 600 + D PO) Take 2 tablets by mouth daily.    . cyanocobalamin 2000 MCG tablet Take 2,000 mcg by mouth daily.    Marland Kitchen escitalopram (LEXAPRO) 10 MG tablet Take 1 tablet (10 mg total) by mouth daily. 90 tablet 3  . ferrous sulfate 325 (65 FE) MG tablet Take 325 mg by mouth daily with breakfast.    . zolpidem (AMBIEN) 10 MG tablet TAKE 1 TABLET BY MOUTH AT BEDTIME AS NEEDED (Patient taking differently: 10 mg. TAKE 1 TABLET BY MOUTH AT BEDTIME AS NEEDED) 90 tablet 1  . amoxicillin-clavulanate (AUGMENTIN) 875-125 MG per tablet Take 1 tablet by mouth 2 (two) times daily. For ten days (Patient not taking: Reported on 04/21/2014) 20 tablet 0  . cefUROXime (CEFTIN) 500 MG tablet Take 1 by mouth twice a day (Patient not taking: Reported on 04/21/2014) 14 tablet 0  . clonazePAM (KLONOPIN) 1 MG tablet TAKE 1 TABLET BY MOUTH ONCE DAILY AS NEEDED FOR ANXIETY (Patient not taking: Reported on 04/21/2014) 30 tablet 0  . cyclobenzaprine (FLEXERIL) 10 MG tablet Take 1 tablet (10 mg total) by mouth 3 (three) times daily as needed for muscle spasms. (Patient not taking: Reported on 04/21/2014) 30 tablet 2  . hydrocortisone-pramoxine (PROCTOFOAM-HC) rectal foam Place 1 applicator rectally 2 (two) times daily. (Patient not taking: Reported on 04/21/2014) 10 g 1  . oxyCODONE-acetaminophen (ROXICET) 5-325 MG per tablet Take 1-2 tablets by mouth every 6 (six) hours as needed for severe pain. (Patient not taking: Reported on 04/21/2014) 30 tablet 0  . pantoprazole (PROTONIX) 40 MG tablet Take 1 tablet (40 mg total) by mouth daily. (Patient not taking: Reported on 04/21/2014) 30 tablet 0   No current facility-administered medications for this visit.    Allergies as of 04/21/2014  . (No Known Allergies)    Vitals: BP 136/99 mmHg  Pulse 67  Temp(Src) 97.1 F (36.2 C)  Ht 5' 1.5" (1.562 m)  Wt 178 lb (80.74 kg)  BMI 33.09 kg/m2  LMP 01/13/2005 Last Weight:  Wt  Readings from Last 1 Encounters:  04/21/14 178 lb (80.74 kg)   Last Height:   Ht Readings from Last 1 Encounters:  04/21/14 5' 1.5" (1.562 m)   Physical exam: Exam: Gen: NAD, conversant, well nourised,  well groomed                     CV: RRR, no MRG. No Carotid Bruits. No peripheral edema, warm, nontender Eyes: Conjunctivae clear without exudates or hemorrhage  Neuro: Detailed Neurologic Exam  Speech:  Speech is normal; fluent and spontaneous with normal comprehension.  Cognition:    The patient is oriented to person, place, and time;     recent and remote memory intact;     language fluent;     normal attention, concentration,     fund of knowledge Cranial Nerves:    The pupils are equal, round, and reactive to light. The fundi are normal and spontaneous venous pulsations are present. Visual fields are full to finger confrontation. Extraocular movements are intact. Trigeminal sensation is intact and the muscles of mastication are normal. The face is symmetric. The palate elevates in the midline. Hearing impaired. Voice is normal. Shoulder shrug is normal. The tongue has normal motion without fasciculations.   Coordination:    Normal finger to nose and heel to shin. Impaired tandem.  Gait:    Impaired tandem, gait ataxic.   Motor Observation:    No asymmetry, no atrophy, and no involuntary movements noted. Tone:    Normal muscle tone.    Posture:    Posture is normal. normal erect    Strength:    Strength is V/V in the upper and lower limbs.      Sensation: intact to LT     Reflex Exam:  DTR's:    Deep tendon reflexes in the upper and lower extremities are normal bilaterally.   Toes:    The toes are downgoing bilaterally.   Clonus:    Clonus is absent.       Assessment/Plan:  64 year old female with a complicated PMHx with dehiscence of the semicircular canals which can be causing her persistent episodes of vertigo and dizziness. But also in the  differential is possibly Vestibular migraines and other possible causes such as central cause of vertigo or a vestibular schwannoma, cerebellar infarct and for pathologies affecting the brainstem or vestibular nerve, vascular event involving the cerebellum,  MRA to detect stenosis or occlusion of the posterior circulation. Will order MRI of the brain and MRA of the head. CMP and TSH. Can consider BAERs as well. Will try Topamax or Verapamil. Also discussed vestibular suppressants such as Meclizine and Benzodiazepines but patient thinks this will sedate her too much.   Addendum 04/30/2014: MRI of the brain 04/18/2008 no acute intracranial abnormality. a number of tiny discrete white matter lesions are identified. These lesions are abrmal but nonspecific. Mild contrast allergy patient developed mild hives and itching that responded well to IV Benadryl.  Sarina Ill, MD  [facility] [address] [place], [place] [address]  Phone [phone] Fax [phone]

## 2014-04-21 NOTE — Telephone Encounter (Signed)
Received records from Dr Thornell Mule on patient 04-21-14.

## 2014-04-22 LAB — COMPREHENSIVE METABOLIC PANEL
ALK PHOS: 84 IU/L (ref 39–117)
ALT: 21 IU/L (ref 0–32)
AST: 26 IU/L (ref 0–40)
Albumin/Globulin Ratio: 1.6 (ref 1.1–2.5)
Albumin: 4.4 g/dL (ref 3.6–4.8)
BUN/Creatinine Ratio: 18 (ref 11–26)
BUN: 14 mg/dL (ref 8–27)
Bilirubin Total: 0.3 mg/dL (ref 0.0–1.2)
CHLORIDE: 102 mmol/L (ref 97–108)
CO2: 26 mmol/L (ref 18–29)
CREATININE: 0.76 mg/dL (ref 0.57–1.00)
Calcium: 9.1 mg/dL (ref 8.7–10.3)
GFR calc Af Amer: 98 mL/min/{1.73_m2} (ref >59)
GFR, EST NON AFRICAN AMERICAN: 85 mL/min/{1.73_m2} (ref >59)
GLUCOSE: 93 mg/dL (ref 65–99)
Globulin, Total: 2.7 g/dL (ref 1.5–4.5)
Potassium: 5.3 mmol/L — ABNORMAL HIGH (ref 3.5–5.2)
Sodium: 141 mmol/L (ref 134–144)
Total Protein: 7.1 g/dL (ref 6.0–8.5)

## 2014-04-22 LAB — THYROID PANEL WITH TSH
FREE THYROXINE INDEX: 2.5 (ref 1.2–4.9)
T3 Uptake Ratio: 28 % (ref 24–39)
T4, Total: 9 ug/dL (ref 4.5–12.0)
TSH: 1.99 u[IU]/mL (ref 0.450–4.500)

## 2014-04-24 ENCOUNTER — Telehealth: Payer: Self-pay | Admitting: *Deleted

## 2014-04-24 ENCOUNTER — Encounter: Payer: Self-pay | Admitting: *Deleted

## 2014-04-24 NOTE — Telephone Encounter (Signed)
Tried calling [facility] [facility] and got no answer and then phone system hung up. I will try again tomorrow.

## 2014-04-24 NOTE — Telephone Encounter (Signed)
Absolutely !! I called the rep today. Is it working? Call patient and let me know, thanks!

## 2014-04-24 NOTE — Telephone Encounter (Signed)
Talked with patient about normal lab results. Pt verbalized understanding. She also was wondering how she could get more samples of trokendi. I told her I would speak with Dr. Jaynee Eagles would give her a call back. Pt verbalized understanding.

## 2014-04-24 NOTE — Telephone Encounter (Signed)
Can you call Carin and see when she can stop by? [name] only has 3 days left, I need a 25mg  pack and a 50mg  pack for her. Carin's number(Trokendi rep) is [phone]. If she can drop them off to you or me that would be great, we can ask Cassandra to log them then put them in the front for [name] to pick up. Thank you. M s. Lugoneeds them by Wednesday.

## 2014-04-25 ENCOUNTER — Other Ambulatory Visit: Payer: Self-pay | Admitting: Neurology

## 2014-04-25 DIAGNOSIS — R42 Dizziness and giddiness: Secondary | ICD-10-CM

## 2014-04-25 DIAGNOSIS — W19XXXA Unspecified fall, initial encounter: Secondary | ICD-10-CM

## 2014-04-25 DIAGNOSIS — R270 Ataxia, unspecified: Secondary | ICD-10-CM

## 2014-04-25 NOTE — Telephone Encounter (Signed)
Tried calling drug representative to see if she could bring in some samples of [place]. I left a message for Carin to call us back. Gave [facility] phone number and office hours.

## 2014-04-25 NOTE — Telephone Encounter (Signed)
Talked with pt to let her know the samples of trokendi are ready to be picked up at the office in the front. A 25 mg pack and 50mg  pack. I also told her we were able to get model number, manufacturer, and serial number of her cochlear BAHA implant so that MRI brain could be processed. Pt verbalized understanding.

## 2014-04-25 NOTE — Telephone Encounter (Signed)
Corrine stopped in the office and dropped off some more samples of Trokendi for Dr. Jaynee Eagles.

## 2014-04-26 NOTE — Telephone Encounter (Signed)
Ordered the MRi thanks

## 2014-04-27 ENCOUNTER — Telehealth: Payer: Self-pay | Admitting: *Deleted

## 2014-04-27 NOTE — Telephone Encounter (Signed)
Patient in office and reports side effects from medication that she started on 04-21-14 by [facility] for vertigo. She reports 7 pound weight loss since Friday and is to increase dose tomorrow. She is not sure name of medication and asked [place] to check name of medication. (Per medication list, Topiramate ER 25 mg) She reports feeling very anxious on medication and is taking Xanax to balance side effects. Needs refill and wanted to check availability. Per medication list, one refill still available and patient will call her pharmacy. Encouraged to contact prescribing provider regarding side effects and potential to continue current dose instead of increasing dose. Patient states she is not even sure she wants to continue medication at this point. Will discuss with [facility].    Routing to provider for final review. Patient agreeable to disposition. Will close encounter.

## 2014-04-29 ENCOUNTER — Encounter: Payer: Self-pay | Admitting: Neurology

## 2014-05-01 ENCOUNTER — Encounter: Payer: Self-pay | Admitting: Neurology

## 2014-05-02 ENCOUNTER — Other Ambulatory Visit: Payer: Self-pay | Admitting: Neurology

## 2014-05-02 ENCOUNTER — Telehealth: Payer: Self-pay | Admitting: Neurology

## 2014-05-02 DIAGNOSIS — W19XXXA Unspecified fall, initial encounter: Secondary | ICD-10-CM

## 2014-05-02 DIAGNOSIS — R270 Ataxia, unspecified: Secondary | ICD-10-CM

## 2014-05-02 DIAGNOSIS — R42 Dizziness and giddiness: Secondary | ICD-10-CM

## 2014-05-02 NOTE — Telephone Encounter (Signed)
Patient has been called to schedule MRI and new orders placed by dr Jaynee Eagles

## 2014-05-06 ENCOUNTER — Encounter: Payer: Self-pay | Admitting: Neurology

## 2014-05-06 ENCOUNTER — Other Ambulatory Visit: Payer: Self-pay | Admitting: Neurology

## 2014-05-06 MED ORDER — TOPIRAMATE 25 MG PO TABS: 25.0000 mg | ORAL_TABLET | Freq: Two times a day (BID) | ORAL | Status: DC

## 2014-05-10 ENCOUNTER — Ambulatory Visit (INDEPENDENT_AMBULATORY_CARE_PROVIDER_SITE_OTHER): Payer: 59

## 2014-05-10 ENCOUNTER — Telehealth: Payer: Self-pay | Admitting: *Deleted

## 2014-05-10 DIAGNOSIS — R270 Ataxia, unspecified: Secondary | ICD-10-CM | POA: Diagnosis not present

## 2014-05-10 DIAGNOSIS — R42 Dizziness and giddiness: Secondary | ICD-10-CM

## 2014-05-10 DIAGNOSIS — W19XXXA Unspecified fall, initial encounter: Secondary | ICD-10-CM

## 2014-05-10 NOTE — Telephone Encounter (Signed)
Spoke with Dr. Thornell Mule from [facility] and he stated patient can proceed with MRI up to 3T. He said patient will have to remove sound processor to get MRI done. He said she has a BAHA implant and not a cochlear implant. The BAHA implant is made by [employer]. The BAHA implant is made of titanium metal and said there may be some scatter near the implant on imaging. He thanked [place] for calling.

## 2014-05-10 NOTE — Telephone Encounter (Signed)
Called and spoke with Mia from [facility] to find out if pt can proceed with MRI of the brain because she has cochlear BAHA implant and we need to know specifications for her to haveMRI done. Mia said she was going to page Dr. Thornell Mule because pt is in our office now.

## 2014-05-12 ENCOUNTER — Telehealth: Payer: Self-pay | Admitting: Neurology

## 2014-05-12 NOTE — Telephone Encounter (Signed)
Patient is calling to get MRI results. Patient is very anxious.

## 2014-05-12 NOTE — Telephone Encounter (Signed)
Spoke to patient, discussed MRA of the head. MRI of the brain with non-specific white matter changes. thanks

## 2014-05-18 ENCOUNTER — Encounter: Payer: Self-pay | Admitting: Neurology

## 2014-05-18 ENCOUNTER — Ambulatory Visit (INDEPENDENT_AMBULATORY_CARE_PROVIDER_SITE_OTHER): Payer: 59 | Admitting: Neurology

## 2014-05-18 VITALS — BP 141/91 | HR 67 | Ht 61.5 in | Wt 172.0 lb

## 2014-05-18 DIAGNOSIS — H838X2 Other specified diseases of left inner ear: Secondary | ICD-10-CM

## 2014-05-18 DIAGNOSIS — E878 Other disorders of electrolyte and fluid balance, not elsewhere classified: Secondary | ICD-10-CM

## 2014-05-18 DIAGNOSIS — R42 Dizziness and giddiness: Secondary | ICD-10-CM

## 2014-05-18 MED ORDER — SCOPOLAMINE 1 MG/3DAYS TD PT72: 1.0000 | MEDICATED_PATCH | TRANSDERMAL | Status: DC

## 2014-05-18 NOTE — Progress Notes (Signed)
[facility]    Provider:  Dr Jaynee Eagles Referring Provider: Fanny Bien, MD Primary Care Physician:  Denise Cipro, MD  CC:  Persistent disequilibirum, vertigo  HPI:  Denise Rice is a 64 y.o. female here as a follow up. Her symptoms of chronic disequilibrium are debilitating. Symptoms are chronic, continuous, daily. She is thinking of retiring due to her symptoms which terribly distresses her. Symptoms not improving. Symptoms are worse with valsalva. Patient can "hear" her eye movements which we discussed has been documented extensively in the literature as a symptom of Superior Semicircular Canal Dehiscence. . Sneezing and blowing her nose exacerbates symptos, the room spins. Otherwise chronic disequilibrium. Daily she feels continuously volatile, like she is going to fall. High intensity sounds are uncomforatle, feels more off balance and she is very uncomfortable. She can hear her own voice louder in her ear.   A trial of migraine medications (to rule out vertiginous migraines) did not improve symptoms, do not feel this is a migrainous process. MRI of the brain and MRA of the head ruled out central causes of vertigo: vestibular schwannoma, cerebellar infarct and pathologies affecting the brainstem or vestibular nerve, vascular event involving the cerebellum, stenosis or occlusion of the posterior circulation. Dix-Hallpike negative, not c/w BPPV   She is not getting better, she is getting anxious. The symptoms are debilitating.   Initial visit: 04/21/2014: She is hearing impaied. 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 She is deaf in the left ear. 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 She has a cochlear implant. It started last May, she had a concussion. She fell and hit her head on a concrete shower floor. She has fallen three times. Her depth perception is off. She doesn't realize how close she is to things. The dizziness feels like  she is moving but she is not, if she gets up too quickly she feels dizzy, if she is standing still she will lose balance. She has almost toppled off the toilet bowl. She feels lightheadedness. She is off balance, off kilter, she can't run because the horizon makes her feel dizzy. She has had hearing problems for over 30 years years. 2010 with the sudden seosorineural hearing loss. Dizziness about 10 years ago. Worsening since last May. She lives with these sensations. She can drive, doesn't affect her. No FHx migraines. She fell and hit her head last May, she is unsure if she lost consciousness. She went to the ED, had nausea and vomiting, looked like she was in a prize fight, denies post concussive syndrome. Her mother was deaf in one ear as well.   Review of Systems: Patient complains of symptoms per HPI as well as the following symptoms: hearing loss, ringing in ears, spinning sensation, dizziness, depression, anxiety. Pertinent negatives per HPI. All others negative.   History   Social History  . Marital Status: Married    Spouse Name: Denise Rice   . Number of Children: 1  . Years of Education: MBA   Occupational History  . 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 Smoking status: Never Smoker   . Smokeless tobacco: Never Used  . Alcohol Use: 0.0 oz/week    0 Standard drinks or equivalent per week     Comment: 1 glass wine/6 months  . Drug Use: No  . Sexual Activity: Not on file   Other Topics Concern  . Not on file  Social History Narrative   Lives at home with husband and daughter.   Left handed.   Caffeine use: 2-3 cups (coffee) per day     Family History  Problem Relation Age of Onset  . Heart attack Mother   . Heart disease Mother   . Cancer Father     unknown primary  . Diabetes Maternal Aunt   . Heart disease Maternal Aunt   . Cancer Paternal Aunt   . Cancer Paternal Uncle   . Stroke Sister     Past Medical History    Diagnosis Date  . Anal condyloma   . History of peptic ulcer   . Otosclerosis of left ear   . Hearing loss, sensorineural, high frequency     RIGHT EAR  . 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 PCO (polycystic ovaries)   . Wears hearing aid     RIGHT EAR  . Deafness in left ear   . PONV (postoperative nausea and vomiting)     Past Surgical History  Procedure Laterality Date  . Vaginal hysterectomy  2007    fibroids  . Roux-en-y gastric bypass  AUG 2013  . Laparoscopic gastric banding  02-08-2007  . Strabismus surgery Right   . Laparoscopic cholecystectomy  03-31-2005  . Implantation bone anchored hearing aid Left 07-27-2008    left temporal bone  . Stapedes surgery Left 1985;  1994 x2;  02-03-1998  . Blepharoplasty  03-05-2000  . Laser ablation condolamata N/A 07/30/2012    Procedure: LASER ABLATION CONDOLAMATA;  Surgeon: Leighton Ruff, MD;  Location: [facility];  Service: General;  Laterality: N/A;  . Hammer toe surgery Left 08-29-2013    2nd toe  . Lasik Bilateral 2002    Current Outpatient Prescriptions  Medication Sig Dispense Refill  . ALPRAZolam (XANAX) 1 MG tablet TAKE 1 TABLET BY MOUTH 3 TIMES DAILY AS NEEDED FOR ANXIETY 90 tablet 1  . Biotin 5000 MCG CAPS Take by mouth daily.    . Calcium Carbonate-Vitamin D (CALCIUM 600 + D PO) Take 2 tablets by mouth daily.    . cyanocobalamin 2000 MCG tablet Take 2,000 mcg by mouth daily.    Marland Kitchen escitalopram (LEXAPRO) 10 MG tablet Take 1 tablet (10 mg total) by mouth daily. 90 tablet 3  . ferrous sulfate 325 (65 FE) MG tablet Take 325 mg by mouth daily with breakfast.    . scopolamine (TRANSDERM-SCOP) 1 MG/3DAYS Place 1 patch (1.5 mg total) onto the skin every 3 (three) days. 10 patch 12  . topiramate (TOPAMAX) 25 MG tablet Take 1 tablet (25 mg total) by mouth 2 (two) times daily. 60 tablet 11   No current facility-administered medications for this visit.    Allergies as of 05/18/2014  . (No  Known Allergies)    Vitals: BP 141/91 mmHg  Pulse 67  Ht 5' 1.5" (1.562 m)  Wt 172 lb (78.019 kg)  BMI 31.98 kg/m2  LMP 01/13/2005 Last Weight:  Wt Readings from Last 1 Encounters:  05/18/14 172 lb (78.019 kg)   Last Height:   Ht Readings from Last 1 Encounters:  05/18/14 5' 1.5" (1.562 m)   Coordination:    Normal finger to nose and heel to shin. Impaired tandem.  Gait:    Impaired tandem, gait ataxic.   Assessment/Plan: Superior Semicircular Canal Dehiscence Syndrome. Recommend f/u with ENT and repair of the defect.  64 year old female with a complicated PMHx with dehiscence of the semicircular canals  which can be causing her persistent episodes of vertigo and dizziness. Other cause in the differential ruled out: A trial of migraine medications (to rule out vertiginous migraines) did not improve symptoms, much less likely that this is a migrainous process. MRI of the brain and MRA of the head ruled out central causes of vertigo: vestibular schwannoma, cerebellar infarct and pathologies affecting the brainstem or vestibular nerve, vascular event involving the cerebellum, stenosis or occlusion of the posterior circulation. Not c/w BPPV  Her symptoms of chronic disequilibrium are debilitating. Symptoms are chronic, continuous, daily. She is thinking of retiring due to her symptoms which terribly distresses her. Symptoms not improving. Symptoms are worse with valsalva. Patient can "hear" her eye movements which we discussed has been documented extensively in the literature as a symptom of Superior Semicircular Canal Dehiscence. . Sneezing and blowing her nose exacerbates symptos, the room spins. Otherwise chronic disequilibrium. Daily she feels continuously volatile, like she is going to fall. High intensity sounds are uncomforatle, feels more off balance and she is very uncomfortable.   Spoke to Dr. Thornell Mule, ENT, in detail. Will order CT of the temporal bones without contrast, axial and  coronal images. Patient can follow up in Dr. Thornell Mule' office for evaluation of surgical evaluation. I called and left a message for patient with details.  Sarina Ill, MD  [facility] [address] [place], [place] [address]  Phone [phone] Fax [phone]  A total of 45 minutes was spent face-to-face with this patient. Over half this time was spent on counseling patient on the Superior Semicircular Canal Syndrome diagnosis and different diagnostic and therapeutic options available.

## 2014-05-19 DIAGNOSIS — H838X3 Other specified diseases of inner ear, bilateral: Secondary | ICD-10-CM | POA: Insufficient documentation

## 2014-05-23 ENCOUNTER — Encounter: Payer: Self-pay | Admitting: *Deleted

## 2014-05-23 ENCOUNTER — Other Ambulatory Visit: Payer: Self-pay | Admitting: Neurology

## 2014-05-23 ENCOUNTER — Telehealth: Payer: Self-pay | Admitting: *Deleted

## 2014-05-23 DIAGNOSIS — H838X2 Other specified diseases of left inner ear: Secondary | ICD-10-CM

## 2014-05-23 NOTE — Telephone Encounter (Signed)
Referral placed, thank you

## 2014-05-23 NOTE — Telephone Encounter (Signed)
Called pt to let her know I gathered all the information for her to bring to Dr. Thornell Mule office. She said she will be out of town for the next week and asked if we could mail it to his office. She said she has a CT scan scheduled for next week before she sees Dr. Thornell Mule. Pt stated Dr. Thornell Mule wanted her to have this done before he sees her. I told her I would go ahead and mail this. Pt verbalized understanding.   I also mailed an extra copy of the recent office visit note, labwork, and MRI/MRA results from 05/10/14 on 05/23/14.

## 2014-05-29 ENCOUNTER — Other Ambulatory Visit: Payer: Self-pay | Admitting: Obstetrics & Gynecology

## 2014-05-30 ENCOUNTER — Other Ambulatory Visit: Payer: Self-pay | Admitting: Obstetrics & Gynecology

## 2014-05-30 NOTE — Telephone Encounter (Signed)
Medication refill request: Xanax Last AEX:  10/04/13 TL Next AEX: 10/20/14 SM Last MMG (if hormonal medication request): 03/15/14 [id number]:Neg Refill authorized: 03/30/14 #90tabs w/ 1R. Today please advise.

## 2014-05-30 NOTE — Telephone Encounter (Signed)
Patient calling to check on refill. Per pharmacy. Last refill filled on 04-27-14.

## 2014-05-31 ENCOUNTER — Telehealth: Payer: Self-pay | Admitting: Obstetrics & Gynecology

## 2014-05-31 NOTE — Telephone Encounter (Signed)
Spoke with pt regariding Xanax refill request.  Pt given Xanax 1.0mg  up to TID prn #90/0RF.  Confirmed with pharmacy pt did pick this up on 04/27/14.  Spoke with pt regarding increased use.  She reports severe vertigo.  Seeing neurologist.  Surgical procedure is in planning stages.  Voiced concern over frequency and my prescribing this for vertigo.  Asked if OK to confirm with neurologist.    Messaged Dr. Jaynee Eagles through [facility] and response was:   [facility] - Yes, I did tell her to take the Xanax for vertigo. And she has been scheduled for possible surgery and I have spoken to ENT, she has dehiscence of the semicircular canal in her ear which is causing the dizziness. I am more than happy to prescribe xanax myself for her, please let me know. Thank you SO much for checking in with me, it is really appreciated. Let me know, I am more than happy to prescribe.    RX RF done and pt notified.  Future refills will be routed to Dr. Jaynee Eagles.

## 2014-06-02 ENCOUNTER — Ambulatory Visit
Admission: RE | Admit: 2014-06-02 | Discharge: 2014-06-02 | Disposition: A | Discharge status: Home / Self Care | Payer: 59 | Source: Ambulatory Visit | Attending: Neurology | Admitting: Neurology

## 2014-06-02 DIAGNOSIS — H838X2 Other specified diseases of left inner ear: Secondary | ICD-10-CM

## 2014-06-05 ENCOUNTER — Encounter: Payer: Self-pay | Admitting: *Deleted

## 2014-06-05 ENCOUNTER — Other Ambulatory Visit: Payer: Self-pay | Admitting: Neurology

## 2014-06-05 NOTE — Patient Instructions (Signed)
Faxed recent office notes, MRA head/MRI brain/CT temporal bones at 5:15 pm on 06/05/14 to Rachell Cipro at [phone]. Received fax confirmation.

## 2014-06-05 NOTE — Progress Notes (Unsigned)
Error

## 2014-06-06 ENCOUNTER — Telehealth: Payer: Self-pay | Admitting: *Deleted

## 2014-06-06 NOTE — Telephone Encounter (Signed)
I requested the patient CT scan on 06/06/14 from [facility].

## 2014-06-07 ENCOUNTER — Telehealth: Payer: Self-pay | Admitting: *Deleted

## 2014-06-07 NOTE — Telephone Encounter (Signed)
Spoke with patient to let her know we received CD of CT temporal bones and she can pick it up at the front by check in at her convenience. Pt stated she will pick them up later. I advised her we have two copies in case she needs another one at a later date. Pt verbalized understanding.

## 2014-06-22 ENCOUNTER — Emergency Department (HOSPITAL_COMMUNITY)
Admission: EM | Admit: 2014-06-22 | Discharge: 2014-06-22 | Disposition: A | Discharge status: Home / Self Care | Payer: 59 | Attending: Emergency Medicine | Admitting: Emergency Medicine

## 2014-06-22 ENCOUNTER — Encounter (HOSPITAL_COMMUNITY): Payer: Self-pay | Admitting: Emergency Medicine

## 2014-06-22 ENCOUNTER — Emergency Department (HOSPITAL_COMMUNITY): Payer: 59

## 2014-06-22 DIAGNOSIS — W19XXXA Unspecified fall, initial encounter: Secondary | ICD-10-CM

## 2014-06-22 DIAGNOSIS — R21 Rash and other nonspecific skin eruption: Secondary | ICD-10-CM | POA: Diagnosis not present

## 2014-06-22 DIAGNOSIS — R55 Syncope and collapse: Secondary | ICD-10-CM | POA: Diagnosis not present

## 2014-06-22 DIAGNOSIS — H9192 Unspecified hearing loss, left ear: Secondary | ICD-10-CM | POA: Insufficient documentation

## 2014-06-22 DIAGNOSIS — Z8639 Personal history of other endocrine, nutritional and metabolic disease: Secondary | ICD-10-CM | POA: Diagnosis not present

## 2014-06-22 DIAGNOSIS — Y9389 Activity, other specified: Secondary | ICD-10-CM | POA: Insufficient documentation

## 2014-06-22 DIAGNOSIS — S8991XA Unspecified injury of right lower leg, initial encounter: Secondary | ICD-10-CM | POA: Diagnosis present

## 2014-06-22 DIAGNOSIS — Z79899 Other long term (current) drug therapy: Secondary | ICD-10-CM | POA: Diagnosis not present

## 2014-06-22 DIAGNOSIS — S8011XA Contusion of right lower leg, initial encounter: Secondary | ICD-10-CM | POA: Diagnosis not present

## 2014-06-22 DIAGNOSIS — Y998 Other external cause status: Secondary | ICD-10-CM | POA: Insufficient documentation

## 2014-06-22 DIAGNOSIS — Z8711 Personal history of peptic ulcer disease: Secondary | ICD-10-CM | POA: Insufficient documentation

## 2014-06-22 DIAGNOSIS — T148XXA Other injury of unspecified body region, initial encounter: Secondary | ICD-10-CM

## 2014-06-22 DIAGNOSIS — Y9289 Other specified places as the place of occurrence of the external cause: Secondary | ICD-10-CM | POA: Diagnosis not present

## 2014-06-22 DIAGNOSIS — W01198A Fall on same level from slipping, tripping and stumbling with subsequent striking against other object, initial encounter: Secondary | ICD-10-CM | POA: Diagnosis not present

## 2014-06-22 LAB — BASIC METABOLIC PANEL
ANION GAP: 11 (ref 5–15)
BUN: 12 mg/dL (ref 6–20)
CHLORIDE: 105 mmol/L (ref 101–111)
CO2: 23 mmol/L (ref 22–32)
CREATININE: 0.64 mg/dL (ref 0.44–1.00)
Calcium: 8.9 mg/dL (ref 8.9–10.3)
GFR calc Af Amer: >60 mL/min (ref >60)
GFR calc non Af Amer: >60 mL/min (ref >60)
GLUCOSE: 106 mg/dL — AB (ref 65–99)
POTASSIUM: 3.5 mmol/L (ref 3.5–5.1)
Sodium: 139 mmol/L (ref 135–145)

## 2014-06-22 LAB — CBC
HCT: 38.6 % (ref 36.0–46.0)
HEMOGLOBIN: 13.4 g/dL (ref 12.0–15.0)
MCH: 26.9 pg (ref 26.0–34.0)
MCHC: 34.7 g/dL (ref 30.0–36.0)
MCV: 77.5 fL — AB (ref 78.0–100.0)
Platelets: 190 10*3/uL (ref 150–400)
RBC: 4.98 MIL/uL (ref 3.87–5.11)
RDW: 13.4 % (ref 11.5–15.5)
WBC: 6.8 10*3/uL (ref 4.0–10.5)

## 2014-06-22 MED ORDER — IBUPROFEN 600 MG PO TABS: 600.0000 mg | ORAL_TABLET | Freq: Four times a day (QID) | ORAL | Status: DC | PRN

## 2014-06-22 MED ORDER — HYDROCODONE-ACETAMINOPHEN 5-325 MG PO TABS
2.0000 | ORAL_TABLET | Freq: Once | ORAL | Status: AC
  Administered 2014-06-22: 2 via ORAL
  Filled 2014-06-22: qty 2

## 2014-06-22 MED ORDER — HYDROCODONE-ACETAMINOPHEN 5-325 MG PO TABS: 1.0000 | ORAL_TABLET | Freq: Four times a day (QID) | ORAL | Status: DC | PRN

## 2014-06-22 NOTE — ED Provider Notes (Signed)
CSN: [id number]     Arrival date & time 06/22/14  1617 History   First MD Initiated Contact with Patient 06/22/14 1623     Chief Complaint  Patient presents with  . Near Syncope     (Consider location/radiation/quality/duration/timing/severity/associated sxs/prior Treatment) HPI Comments: Pt has hx of vertigo and she had a fall as a result of her vertigo today. Pt fell on to her R knee and hit her face on brick as well. No nausea, vomiting, visual complains, seizures, altered mental status, loss of consciousness, new weakness, or numbness. Pt has a mild headache, anterior. PT has R knee pain, and she was unable to put complete weight on her leg post injury.    Patient is a 64 y.o. female presenting with near-syncope. The history is provided by the patient.  Near Syncope Associated symptoms include headaches. Pertinent negatives include no chest pain, no abdominal pain and no shortness of breath.    Past Medical History  Diagnosis Date  . Anal condyloma   . History of peptic ulcer   . Otosclerosis of left ear   . Hearing loss, sensorineural, high frequency     RIGHT EAR  . 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 PCO (polycystic ovaries)   . Wears hearing aid     RIGHT EAR  . Deafness in left ear   . PONV (postoperative nausea and vomiting)    Past Surgical History  Procedure Laterality Date  . Vaginal hysterectomy  2007    fibroids  . Roux-en-y gastric bypass  AUG 2013  . Laparoscopic gastric banding  02-08-2007  . Strabismus surgery Right   . Laparoscopic cholecystectomy  03-31-2005  . Implantation bone anchored hearing aid Left 07-27-2008    left temporal bone  . Stapedes surgery Left 1985;  1994 x2;  02-03-1998  . Blepharoplasty  03-05-2000  . Laser ablation condolamata N/A 07/30/2012    Procedure: LASER ABLATION CONDOLAMATA;  Surgeon: Leighton Ruff, MD;  Location: [facility];  Service: General;  Laterality: N/A;  . Hammer toe surgery Left  08-29-2013    2nd toe  . Lasik Bilateral 2002   Family History  Problem Relation Age of Onset  . Heart attack Mother   . Heart disease Mother   . Cancer Father     unknown primary  . Diabetes Maternal Aunt   . Heart disease Maternal Aunt   . Cancer Paternal Aunt   . Cancer Paternal Uncle   . Stroke Sister    History  Substance Use Topics  . Smoking status: Never Smoker   . Smokeless tobacco: Never Used  . Alcohol Use: 0.0 oz/week    0 Standard drinks or equivalent per week     Comment: 1 glass wine/6 months   OB History    Gravida Para Term Preterm AB TAB SAB Ectopic Multiple Living   0              Review of Systems  Constitutional: Negative for activity change.  Respiratory: Negative for shortness of breath.   Cardiovascular: Positive for near-syncope. Negative for chest pain.  Gastrointestinal: Negative for nausea, vomiting and abdominal pain.  Genitourinary: Negative for dysuria.  Musculoskeletal: Positive for arthralgias. Negative for neck pain.  Skin: Positive for rash.  Neurological: Positive for headaches.      Allergies  Review of patient's allergies indicates no known allergies.  Home Medications   Prior to Admission medications   Medication Sig Start Date End Date  Taking? Authorizing Provider  ALPRAZolam (XANAX) 1 MG tablet TAKE 1 TABLET BY MOUTH 3 TIMES DAILY AS NEEDED FOR ANXIETY Patient taking differently: 0.5mg  by mouth once daily 05/30/14  Yes Megan Salon, MD  Biotin 5000 MCG CAPS Take by mouth daily.   Yes Historical Provider, MD  Calcium Carbonate-Vitamin D (CALCIUM 600 + D PO) Take 1 tablet by mouth daily.    Yes Historical Provider, MD  cyanocobalamin 2000 MCG tablet Take 2,000 mcg by mouth daily.   Yes Historical Provider, MD  escitalopram (LEXAPRO) 10 MG tablet Take 1 tablet (10 mg total) by mouth daily. Patient taking differently: Take 5 mg by mouth daily.  10/04/13  Yes Elveria Rising, MD  ferrous sulfate 325 (65 FE) MG tablet Take 325 mg  by mouth daily with breakfast.   Yes Historical Provider, MD  Multiple Vitamins-Minerals (MULTIVITAMIN ADULT PO) Take 1 tablet by mouth.   Yes Historical Provider, MD  topiramate (TOPAMAX) 25 MG tablet Take 1 tablet (25 mg total) by mouth 2 (two) times daily. Patient taking differently: Take 25 mg by mouth daily.  05/06/14  Yes Melvenia Beam, MD  zolpidem (AMBIEN) 10 MG tablet Take 10 mg by mouth at bedtime as needed for sleep.   Yes Historical Provider, MD  HYDROcodone-acetaminophen (NORCO/VICODIN) 5-325 MG per tablet Take 1 tablet by mouth every 6 (six) hours as needed for severe pain. 06/22/14   Varney Biles, MD  ibuprofen (ADVIL,MOTRIN) 600 MG tablet Take 1 tablet (600 mg total) by mouth every 6 (six) hours as needed. 06/22/14   Varney Biles, MD  scopolamine (TRANSDERM-SCOP) 1 MG/3DAYS Place 1 patch (1.5 mg total) onto the skin every 3 (three) days. Patient not taking: Reported on 06/22/2014 05/18/14   Melvenia Beam, MD   BP 111/66 mmHg  Pulse 68  Temp(Src) 98.1 F (36.7 C) (Oral)  Resp 17  SpO2 98%  LMP 01/13/2005 Physical Exam  Constitutional: She is oriented to person, place, and time. She appears well-developed and well-nourished.  HENT:  Head: Normocephalic and atraumatic.  Eyes: EOM are normal. Pupils are equal, round, and reactive to light.  Neck: Neck supple.  No midline c-spine tenderness  Cardiovascular: Normal rate, regular rhythm and normal heart sounds.   No murmur heard. Pulmonary/Chest: Effort normal and breath sounds normal. No respiratory distress. She exhibits no tenderness.  Abdominal: Soft. Bowel sounds are normal. She exhibits no distension. There is no tenderness. There is no rebound and no guarding.  Musculoskeletal:  R tibia - tender to palpation. There is ecchymoses as well. BESIDES THAT: No long bone tenderness - upper and lower extrmeities and no pelvic pain, instability.  Neurological: She is alert and oriented to person, place, and time. No cranial  nerve deficit.  Skin: Skin is warm and dry. No rash noted.  Nursing note and vitals reviewed.   ED Course  Procedures (including critical care time) Labs Review Labs Reviewed  CBC - Abnormal; Notable for the following:    MCV 77.5 (*)    All other components within normal limits  BASIC METABOLIC PANEL - Abnormal; Notable for the following:    Glucose, Bld 106 (*)    All other components within normal limits    Imaging Review Dg Tibia/fibula Right  06/22/2014   CLINICAL DATA:  Fall with right lower extremity edema. Initial encounter.  EXAM: RIGHT TIBIA AND FIBULA - 2 VIEW  COMPARISON:  None.  FINDINGS: No fracture or dislocation identified. Soft tissues are unremarkable. No bony lesions.  No foreign body identified.  IMPRESSION: Normal right tibia and fibula.   Electronically Signed   By: Aletta Edouard M.D.   On: 06/22/2014 18:37     EKG Interpretation   Date/Time:  Thursday June 22 2014 16:36:59 EDT Ventricular Rate:  71 PR Interval:  120 QRS Duration: 94 QT Interval:  409 QTC Calculation: 444 R Axis:   -23 Text Interpretation:  Sinus rhythm Borderline left axis deviation Low  voltage, precordial leads No significant change since last tracing  Confirmed by Kathrynn Humble, MD, Thelma Comp [phone]) on 06/22/2014 6:57:47 PM      MDM   Final diagnoses:  Fall, initial encounter  Contusion   Pt comes in with cc of fall. Pt has vertigo hx, and her fall was a result of that. She has knee pain, R side - which actually shows no patellar laxity or knee ROM abnormality. She has proximal tibia tenderness. Neck and brain cleared clinically.  Xrays show no fx. Will ambulate and put knee immobilizer. Pt has a cain at home.     Varney Biles, MD 06/22/14 [phone]

## 2014-06-22 NOTE — ED Notes (Signed)
Bed: WA02 Expected date:  Expected time:  Means of arrival:  Comments: EMS-syncope 

## 2014-06-22 NOTE — ED Notes (Signed)
Per EMS- (hx inner ear/vertigo issues)-has intermittent episodes of falling with disorientation. Did not lose consciousness. Has small abrasion above lip. Right knee and ankle are swollen with pain. Has previous injury to right knee. Denies neck or back pain. C/o headache starting en route. A&Ox4 at this time. VS: 140/90 HR 90 CBG 98 mg/dl RR 18 SpO2 98% on RA.

## 2014-06-22 NOTE — Discharge Instructions (Signed)
We saw you in the ER after you had a fall. All the imaging results are normal, no fractures seen. No evidence of brain bleed. Please be very careful with walking, and do everything possible to prevent falls.   Contusion A contusion is a deep bruise. Contusions are the result of an injury that caused bleeding under the skin. The contusion may turn blue, purple, or yellow. Minor injuries will give you a painless contusion, but more severe contusions may stay painful and swollen for a few weeks.  CAUSES  A contusion is usually caused by a blow, trauma, or direct force to an area of the body. SYMPTOMS   Swelling and redness of the injured area.  Bruising of the injured area.  Tenderness and soreness of the injured area.  Pain. DIAGNOSIS  The diagnosis can be made by taking a history and physical exam. An X-ray, CT scan, or MRI may be needed to determine if there were any associated injuries, such as fractures. TREATMENT  Specific treatment will depend on what area of the body was injured. In general, the best treatment for a contusion is resting, icing, elevating, and applying cold compresses to the injured area. Over-the-counter medicines may also be recommended for pain control. Ask your caregiver what the best treatment is for your contusion. HOME CARE INSTRUCTIONS   Put ice on the injured area.  Put ice in a plastic bag.  Place a towel between your skin and the bag.  Leave the ice on for 15-20 minutes, 3-4 times a day, or as directed by your health care provider.  Only take over-the-counter or prescription medicines for pain, discomfort, or fever as directed by your caregiver. Your caregiver may recommend avoiding anti-inflammatory medicines (aspirin, ibuprofen, and naproxen) for 48 hours because these medicines may increase bruising.  Rest the injured area.  If possible, elevate the injured area to reduce swelling. SEEK IMMEDIATE MEDICAL CARE IF:   You have increased bruising  or swelling.  You have pain that is getting worse.  Your swelling or pain is not relieved with medicines. MAKE SURE YOU:   Understand these instructions.  Will watch your condition.  Will get help right away if you are not doing well or get worse. Document Released: 10/09/2004 Document Revised: 01/04/2013 Document Reviewed: 11/04/2010 [facility] Patient Information 2015 [place], [place]. This information is not intended to replace advice given to you by your health care provider. Make sure you discuss any questions you have with your health care provider.

## 2014-06-22 NOTE — ED Notes (Signed)
Ortho called 

## 2014-06-23 ENCOUNTER — Encounter: Payer: Self-pay | Admitting: Neurology

## 2014-07-11 ENCOUNTER — Encounter: Payer: Self-pay | Admitting: Neurology

## 2014-07-19 ENCOUNTER — Ambulatory Visit
Admission: RE | Admit: 2014-07-19 | Discharge: 2014-07-19 | Disposition: A | Discharge status: Home / Self Care | Payer: 59 | Source: Ambulatory Visit | Attending: Family Medicine | Admitting: Family Medicine

## 2014-07-19 ENCOUNTER — Other Ambulatory Visit: Payer: Self-pay | Admitting: Family Medicine

## 2014-07-19 DIAGNOSIS — T148XXA Other injury of unspecified body region, initial encounter: Secondary | ICD-10-CM

## 2014-07-20 ENCOUNTER — Encounter: Payer: Self-pay | Admitting: Neurology

## 2014-07-30 ENCOUNTER — Encounter: Payer: Self-pay | Admitting: Neurology

## 2014-08-01 ENCOUNTER — Ambulatory Visit (INDEPENDENT_AMBULATORY_CARE_PROVIDER_SITE_OTHER): Payer: 59 | Admitting: Neurology

## 2014-08-01 ENCOUNTER — Encounter: Payer: Self-pay | Admitting: Neurology

## 2014-08-01 VITALS — BP 120/73 | HR 63 | Ht 61.5 in | Wt 166.8 lb

## 2014-08-01 DIAGNOSIS — H838X3 Other specified diseases of inner ear, bilateral: Secondary | ICD-10-CM | POA: Diagnosis not present

## 2014-08-01 DIAGNOSIS — R42 Dizziness and giddiness: Secondary | ICD-10-CM | POA: Diagnosis not present

## 2014-08-01 DIAGNOSIS — E878 Other disorders of electrolyte and fluid balance, not elsewhere classified: Secondary | ICD-10-CM

## 2014-08-01 MED ORDER — ALPRAZOLAM 1 MG PO TABS: ORAL_TABLET | ORAL | Status: DC

## 2014-08-01 NOTE — Patient Instructions (Addendum)
Overall you are doing fairly well but I do want to suggest a few things today:   Remember to drink plenty of fluid, eat healthy meals and do not skip any meals. Try to eat protein with a every meal and eat a healthy snack such as fruit or nuts in between meals. Try to keep a regular sleep-wake schedule and try to exercise daily, particularly in the form of walking, 20-30 minutes a day, if you can.   As far as your medications are concerned, I would like to suggest: Continue current meds  I would like to see you back in 4-6 months sooner if we need to. Please call us with any interim questions, concerns, problems, updates or refill requests.   Our phone number is [phone]. We also have an after hours call service for urgent matters and there is a physician on-call for urgent questions. For any emergencies you know to call 911 or go to the nearest emergency room  [facility]

## 2014-08-01 NOTE — Progress Notes (Signed)
[facility]    Provider:  Dr Jaynee Eagles Referring Provider: Fanny Bien, MD Primary Care Physician:  Rachell Cipro, MD  CC:  Head trauma  HPI:  Denise Rice is a lovely 64 y.o. female here as a referral from Dr. 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 She has had multiple falls, most recently in her kitchen.   She does not remember falling. She was facing the fridge and truned around and next thing she remembers she was on the ground. She did not proceed to the ED. She had a split lip and there was blood everywhere. Her nose is hurting on the bridge but reports no further alteration of consciousness or confusion. She has been having headaches since hitting her head but they are improving. No irritability, no decreased concentration, no increase in dizziness, no further memory changes or other post-concussive symptma. The headaches feel dull throbbing. She is better, hasn;t had one in a few days. This is the first time she has fallen and has no recollection of the fall. Discussed at length, it appears as though she may have hit her head pretty hard on the floor and possibly cause some memory loss. I did encourage her to go to the emergency room next time for evaluation of intracranial hematomas as this can be a serious complication of falls.   Patient is scheduled for repair of the semicircular canals. She has been told she may be dizzier afterwards but over the long term her surgeon is hopeful it will help. She had her first surgery in 1970. She is using a cane. She feels good today. Yesterday she also felt well. Walking makes her feel worse.  The Xanax 1mg  twice a day helps a lot for the disequilibrium. The scopolamine patch helped but was too expensive. She will be admitted on July [facility]nd to [facility]. She takes [place] every night. Discussed that at this time it's important for her to concentrate on her surgery and get  better. After her surgery and recovery, we can discuss the Xanax and Ambien in the evenings and see if we can decrease that. If she has any more episodes of falls with loss of consciousness. May need a cardiac evaluation for abnormal arrhythmias  Review of Systems: Patient complains of symptoms per HPI as well as the following symptoms: Passing out. Pertinent negatives per HPI. All others negative.   History   Social History  . Marital Status: Married    Spouse Name: Vicenta Dunning   . Number of Children: 1  . Years of Education: MBA   Occupational History  . Director of [employer]   Social History Main Topics  . Smoking status: Never Smoker   . Smokeless tobacco: Never Used  . Alcohol Use: 0.0 oz/week    0 Standard drinks or equivalent per week     Comment: 1 glass wine/6 months  . Drug Use: No  . Sexual Activity: Not on file   Other Topics Concern  . Not on file   Social History Narrative   Lives at home with husband and daughter.   Left handed.   Caffeine use: 2-3 cups (coffee) per day     Family History  Problem Relation Age of Onset  . Heart attack Mother   . Heart disease Mother   . Cancer Father     unknown primary  . Diabetes Maternal Aunt   . Heart disease Maternal  Aunt   . Cancer Paternal Aunt   . Cancer Paternal Uncle   . Stroke Sister     Past Medical History  Diagnosis Date  . Anal condyloma   . History of peptic ulcer   . Otosclerosis of left ear   . Hearing loss, sensorineural, high frequency     RIGHT EAR  . 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 PCO (polycystic ovaries)   . Wears hearing aid     RIGHT EAR  . Deafness in left ear   . PONV (postoperative nausea and vomiting)     Past Surgical History  Procedure Laterality Date  . Vaginal hysterectomy  2007    fibroids  . Roux-en-y gastric bypass  AUG 2013  . Laparoscopic gastric banding  02-08-2007  . Strabismus surgery Right   . Laparoscopic  cholecystectomy  03-31-2005  . Implantation bone anchored hearing aid Left 07-27-2008    left temporal bone  . Stapedes surgery Left 1985;  1994 x2;  02-03-1998  . Blepharoplasty  03-05-2000  . Laser ablation condolamata N/A 07/30/2012    Procedure: LASER ABLATION CONDOLAMATA;  Surgeon: Leighton Ruff, MD;  [facility];  Service: General;  Laterality: N/A;  . Hammer toe surgery Left 08-29-2013    2nd toe  . Lasik Bilateral 2002    Current Outpatient Prescriptions  Medication Sig Dispense Refill  . ALPRAZolam (XANAX) 1 MG tablet TAKE 1 TABLET BY MOUTH 3 TIMES DAILY AS NEEDED FOR ANXIETY (Patient taking differently: 0.5mg  by mouth once daily) 90 tablet 1  . Biotin 5000 MCG CAPS Take by mouth daily.    . Calcium Carbonate-Vitamin D (CALCIUM 600 + D PO) Take 1 tablet by mouth daily.     . cyanocobalamin 2000 MCG tablet Take 2,000 mcg by mouth daily.    Marland Kitchen escitalopram (LEXAPRO) 10 MG tablet Take 1 tablet (10 mg total) by mouth daily. (Patient taking differently: Take 5 mg by mouth daily. ) 90 tablet 3  . ferrous sulfate 325 (65 FE) MG tablet Take 325 mg by mouth daily with breakfast.    . ibuprofen (ADVIL,MOTRIN) 600 MG tablet Take 1 tablet (600 mg total) by mouth every 6 (six) hours as needed. 30 tablet 0  . Multiple Vitamins-Minerals (MULTIVITAMIN ADULT PO) Take 1 tablet by mouth.    . zolpidem (AMBIEN) 10 MG tablet Take 10 mg by mouth at bedtime as needed for sleep.     No current facility-administered medications for this visit.    Allergies as of 08/01/2014  . (No Known Allergies)    Vitals: BP 120/73 mmHg  Pulse 63  Ht 5' 1.5" (1.562 m)  Wt 166 lb 12.8 oz (75.66 kg)  BMI 31.01 kg/m2  LMP 01/13/2005 Last Weight:  Wt Readings from Last 1 Encounters:  08/01/14 166 lb 12.8 oz (75.66 kg)   Last Height:   Ht Readings from Last 1 Encounters:  08/01/14 5' 1.5" (1.562 m)   Physical exam: Exam: Gen: NAD, conversant, well nourised, obese, well groomed                      Eyes: Conjunctivae clear without exudates or hemorrhage  Neuro: Detailed Neurologic Exam  Speech:    Speech is normal; fluent and spontaneous with normal comprehension.  Cognition:    The patient is oriented to person, place, and time;     recent and remote memory intact;     language fluent;  normal attention, concentration,     fund of knowledge Cranial Nerves:    The pupils are equal, round, and reactive to light. Visual fields are full. Extraocular movements are intact.The face is symmetric.  Hearing impaired. Voice is normal.   Coordination:    No dysmetria  Gait:    No ataxia   Motor Observation:    No asymmetry, no atrophy, and no involuntary movements noted.    Posture:    Posture is normal.       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 Recent fall with brief loss of consciousness. She had headaches after the head trauma, which are improving. No other symptoms of postconcussive syndrome. Advised her to proceed to the emergency room should this happen in the future due to possibility of intracranial hemorrhage. If she has any more falls with loss of consciousness, should consider a cardiac evaluation for abnormal arrhythmias. She is having surgery planned for repair of the semicircular canals.  Sarina Ill, MD  [facility] [address] [place], [place] [address]  Phone [phone] Fax [phone]  A total of 30 minutes was spent face-to-face with this patient. Over half this time was spent on counseling patient on the chronic disequilibrium syndrome diagnosis and different diagnostic and therapeutic options available.

## 2014-08-14 HISTORY — PX: CRANIOTOMY: SHX93

## 2014-08-25 ENCOUNTER — Other Ambulatory Visit: Payer: Self-pay | Admitting: *Deleted

## 2014-08-25 NOTE — Patient Outreach (Signed)
[facility]) Care Management  08/25/2014  Denise Rice January 04, 1951 [id number] BENEFIT EXCEPTION  RN spoke with pt today concerning her request for a benefit exception concerning her medical condition that will require surgical intervention at [facility]. Pt has indicated her [facility] ENT doctor has confirm the procedure she needs is not perform by [facility] and recommended several other facilities that are capable of preforming such surgery (vestibular testing and related procedures). Pt choose a referral to [facility] due to a history with Dr. Kerri Perches) she was involved with in the past. Pt will undergo the required surgery on 8/22 and currently requesting in-network coverage for all testings, visits and surgery expenses. Pt states she has contacted [employer] and discussed the required Tier II and has paid a majority of her deductibles and co-pays to [facility] to proceed with the surgery. Due to the neurosurgeon required for this procedure RN will review the obtained information and present case for a benefit exception for the appropriate coverage. Pt is aware that [facility] is not responsible for the approved or decline of her request. RN will follow up with pt based upon the progress of her case. Pt appreicatived and thankful for the follow up call today.  [name], RN Care Management Coordinator [place] [facility] Main Office [phone]

## 2014-09-04 ENCOUNTER — Other Ambulatory Visit: Payer: Self-pay | Admitting: *Deleted

## 2014-09-04 NOTE — Patient Outreach (Signed)
[facility]) Care Management  09/04/2014  AARON BRISON August 29, 1950 [id number]   RN contacted pt with an update however pt not available and spouse reports pt having surgery and will not return home until possibly Friday. RN will follow up next week concerning update on this case.   [name], RN Care Management Coordinator [place] [facility] Main Office [phone]

## 2014-09-06 DIAGNOSIS — H903 Sensorineural hearing loss, bilateral: Secondary | ICD-10-CM | POA: Insufficient documentation

## 2014-09-14 ENCOUNTER — Other Ambulatory Visit: Payer: Self-pay | Admitting: *Deleted

## 2014-09-14 NOTE — Patient Outreach (Signed)
[facility]) Care Management  09/14/2014  Denise Rice Jul 02, 1950 [id number]   Attempted to reach pt today however not available. RN left a message requesting a call back. Will update pt accordingly concerning her approved benefit exception once in contact.  [name], RN Care Management Coordinator [place] [facility] Main Office [phone]

## 2014-09-15 ENCOUNTER — Other Ambulatory Visit: Payer: Self-pay | Admitting: *Deleted

## 2014-09-15 NOTE — Patient Outreach (Signed)
[facility]) Care Management  09/15/2014  Denise Rice [birth year] [id number]   RN spoke with pt today and confirm that she has been approved for 80% coverage on her recent surgical procedure at [facility] under Dr. Danne Baxter for a semicircular repair. Pt inquired on additional information that would require her to contact [employer] for further information on her deductibles paid due other high risk plan however grateful for [facility] services at this time. Pt continues to recover well with no issues or delays mentioned at this time. Due to the approved benefit exception no additional needs presented. This case will be closed and pt is aware if additional benefits are needed to present according through the [facility] office.  [name], RN Care Management Coordinator [place] [facility] Main Office [phone]

## 2014-10-06 ENCOUNTER — Ambulatory Visit: Payer: 59 | Admitting: Gynecology

## 2014-10-09 ENCOUNTER — Other Ambulatory Visit: Payer: Self-pay | Admitting: Obstetrics & Gynecology

## 2014-10-09 NOTE — Telephone Encounter (Signed)
Medication refill request: Ambien  Last AEX:  09/24/13 TL Next AEX: 10/20/14 SM Last MMG (if hormonal medication request): 03/15/14 [email or web address]:neg Refill authorized: Please advise.  Routed to Dr. Quincy Simmonds

## 2014-10-09 NOTE — Telephone Encounter (Signed)
Rx faxed today 

## 2014-10-20 ENCOUNTER — Ambulatory Visit: Payer: 59 | Admitting: Obstetrics & Gynecology

## 2014-11-06 ENCOUNTER — Other Ambulatory Visit: Payer: Self-pay | Admitting: Obstetrics and Gynecology

## 2014-11-06 NOTE — Telephone Encounter (Signed)
Medication refill request: Ambien  Last AEX:  10/04/13 TL Next AEX: ? Last MMG (if hormonal medication request): 03/15/14 [email or web address]:neg Refill authorized: 10/09/14 #[id number]. Today please advise.

## 2014-11-09 NOTE — Telephone Encounter (Signed)
Patient at pharmacy calling regarding refill. Requesting 90 day refill Ambien 10 mg.

## 2014-11-17 ENCOUNTER — Telehealth: Payer: Self-pay | Admitting: Rehabilitative and Restorative Service Providers"

## 2014-11-17 ENCOUNTER — Ambulatory Visit: Payer: 59 | Attending: Otolaryngology | Admitting: Rehabilitative and Restorative Service Providers"

## 2014-11-17 DIAGNOSIS — R269 Unspecified abnormalities of gait and mobility: Secondary | ICD-10-CM | POA: Insufficient documentation

## 2014-11-17 DIAGNOSIS — H832X3 Labyrinthine dysfunction, bilateral: Secondary | ICD-10-CM | POA: Diagnosis present

## 2014-11-17 NOTE — Patient Instructions (Signed)
Rice, Denise [address] [place], [place]  [address] Phone:  [phone] Fax:  [phone]   Gaze Stabilization: Tip Card 1.Target must remain in focus, not blurry, and appear stationary while head is in motion. 2.Perform exercises with small head movements (45 to either side of midline). 3.Increase speed of head motion so long as target is in focus. 4.If you wear eyeglasses, be sure you can see target through lens (therapist will give specific instructions for bifocal / progressive lenses). 5.These exercises may provoke dizziness or nausea. Work through these symptoms. If too dizzy, slow head movement slightly. Rest between each exercise. 6.Exercises demand concentration; avoid distractions. 7.For safety, perform standing exercises close to a counter, wall, corner, or next to someone.  Copyright  VHI. All rights reserved.  Gaze Stabilization: Standing Feet Apart   Feet shoulder width apart, keeping eyes on target on wall 3 feet away, tilt head down slightly and move head side to side for 30 seconds. Repeat while moving head up and down for 30 seconds. Do 2 sessions per day.   Copyright  VHI. All rights reserved.

## 2014-11-17 NOTE — Telephone Encounter (Signed)
Patient unsafe with small based quad cane during ambulation.  PT tried one forearm crutch and patient with improved stability.    Please submit order for forearm crutch via epic or via fax to [phone].  Thank you for the referral of this patient. Rudell Cobb, MPT [facility] [address] [place], [place]  [address] Phone:  [phone] Fax:  [phone]

## 2014-11-17 NOTE — Therapy (Signed)
[facility] [address] [place], [place], [address] Phone: [phone]   Fax:  [phone]  Physical Therapy Evaluation  Patient Details  Name: Denise Rice MRN: [id number] Date of Birth: 10-Oct-1950 Referring Provider: Oletta Cohn, MD  Encounter Date: 11/17/2014      PT End of Session - 11/17/14 1544    Visit Number 1   Number of Visits 16   Date for PT Re-Evaluation 01/15/15   PT Start Time 1240   PT Stop Time 1340   PT Time Calculation (min) 60 min   Equipment Utilized During Treatment Gait belt   Activity Tolerance Patient tolerated treatment well   Behavior During Therapy [facility] for tasks assessed/performed      Past Medical History  Diagnosis Date  . Anal condyloma   . History of peptic ulcer   . Otosclerosis of left ear   . Hearing loss, sensorineural, high frequency     RIGHT EAR  . 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 PCO (polycystic ovaries)   . Wears hearing aid     RIGHT EAR  . Deafness in left ear   . PONV (postoperative nausea and vomiting)     Past Surgical History  Procedure Laterality Date  . Vaginal hysterectomy  2007    fibroids  . Roux-en-y gastric bypass  AUG 2013  . Laparoscopic gastric banding  02-08-2007  . Strabismus surgery Right   . Laparoscopic cholecystectomy  03-31-2005  . Implantation bone anchored hearing aid Left 07-27-2008    left temporal bone  . Stapedes surgery Left 1985;  1994 x2;  02-03-1998  . Blepharoplasty  03-05-2000  . Laser ablation condolamata N/A 07/30/2012    Procedure: LASER ABLATION CONDOLAMATA;  Surgeon: Leighton Ruff, MD;  Location: [facility];  Service: General;  Laterality: N/A;  . Hammer toe surgery Left 08-29-2013    2nd toe  . Lasik Bilateral 2002    There were no vitals filed for this visit.  Visit Diagnosis:  Abnormality of gait  Vestibular disequilibrium, bilateral      Subjective Assessment -  11/17/14 1244    Subjective The patient presents with h/o left side chronic vestibular hypofunction with multiple surgeries on the left ear.  She underwent surgery for right superior canal dehiscence in August 2016.  She reports initial decrease in hearing, that is improving since surgery and that her balance is improving gradually.    She uses the quad cane at all times.  She reports she has had one fall since surgery (multiple falls prior).     Pertinent History She has also had concussion from a fall 05/2013.  She fell 4-5 times prior to surgery for R superior canal dehiscence.  She has h/o falls with injuries.  L side absent hearing has adaptive hearing device.   6 surgeries on L ear related to h/o otosclerosis. Patient had extensive evaluation to determine that R side was contributing to vertigo prior to surgery per report.     Patient Stated Goals Decrease dependence on the quad cane, return to prior exercise program, improve balance, reduce fatigue.   Currently in Pain? No/denies            [facility] PT Assessment - 11/17/14 1251    Assessment   Medical Diagnosis patient unsteady with [facility]   Referring Provider Oletta Cohn, MD   Precautions   Precautions Fall   Restrictions   Weight Bearing Restrictions No   Balance  Screen   Has the patient fallen in the past 6 months Yes   How many times? 1   Has the patient had a decrease in activity level because of a fear of falling?  Yes   Is the patient reluctant to leave their home because of a fear of falling?  No   Home Environment   Living Environment Private residence   Living Arrangements Spouse/significant other   Type of [place] to enter   Entrance Stairs-Number of Steps 3   Additional Comments "I'm terrified of the steps."   Observation/Other Assessments   Other Surveys  --  dizziness handicap index=76%   Ambulation/Gait   Ambulation/Gait Yes   Ambulation/Gait Assistance 6: Modified independent  (Device/Increase time)   Ambulation Distance (Feet) 200 Feet   Assistive device Small based quad cane   Gait Pattern --  instability with kicking quad cane at times   Ambulation Surface Level   Gait velocity 1.94 ft/sec   Gait Comments The patient is not using quad cane in correct sequence.   Standardized Balance Assessment   Standardized Balance Assessment Berg Balance Test   Berg Balance Test   Sit to Stand Able to stand without using hands and stabilize independently   Standing Unsupported Able to stand safely 2 minutes   Sitting with Back Unsupported but Feet Supported on Floor or Stool Able to sit safely and securely 2 minutes   Stand to Sit Sits safely with minimal use of hands   Transfers Able to transfer safely, definite need of hands   Standing Unsupported with Eyes Closed Able to stand 10 seconds with supervision   Standing Ubsupported with Feet Together Able to place feet together independently and stand 1 minute safely   From Standing, Reach Forward with Outstretched Arm Can reach forward >12 cm safely (5")   From Standing Position, Pick up Object from Floor Able to pick up shoe safely and easily   From Standing Position, Turn to Look Behind Over each Shoulder Turn sideways only but maintains balance   Turn 360 Degrees Needs assistance while turning   Standing Unsupported, Alternately Place Feet on Step/Stool Needs assistance to keep from falling or unable to try   Standing Unsupported, One Foot in Front Able to take small step independently and hold 30 seconds   Standing on One Leg Tries to lift leg/unable to hold 3 seconds but remains standing independently   Total Score 38   Berg comment: R leg stance is more stable than L, R leg posterior in tandem stance is more stable than L            Vestibular Assessment - 11/17/14 1538    Occulomotor Exam   Occulomotor Alignment Normal   Vestibulo-Occular Reflex   VOR 1 Head Only (x 1 viewing) self regulated pace with  decreased gaze stability with evidence of corrective saccades to target   Equities trader Comment Patient scores 40% composite equilibrium score compared to age/height norm of 70%.  She shows WNLs use of somatosensory feedback (80%), moderately decreased use of visual inputs (45%), and significantly impaired use of vestibular inputs (0%).              PT Education - 11/17/14 1543    Education provided Yes   Education Details HEP: gaze x 1 adaptation in seated position, educated on forearm crutch use and provided one to borrow over weekend as quad  cane is fall risk   Person(s) Educated Patient   Methods Explanation;Demonstration;Handout   Comprehension Verbalized understanding;Returned demonstration          PT Short Term Goals - 11/17/14 1544    PT SHORT TERM GOAL #1   Title The patient will be independent with HEP for gaze adaptation, visual compensatory strategies, and balance tasks.   Baseline Target date 12/16/2014   Time 4   Period Weeks   PT SHORT TERM GOAL #2   Title The patient will improve Berg score from 38/56 to > or equal to 45/56 to demo decreased risk for falls.   Baseline Target date 12/16/2014   Time 4   Period Weeks   PT SHORT TERM GOAL #3   Title The patient will improve gait speed from 1.94 ft/sec to > or equal to 2.4 ft/sec to demo improving functional mobility.   Baseline Target date 12/16/2014   Time 4   Period Weeks   PT SHORT TERM GOAL #4   Title The patient will improve sensory organization testing from 40% to > or equal to 52% to demo improving sensory use for balance.   Baseline Target date 12/16/2014   Time 4   Period Weeks           PT Long Term Goals - 11/17/14 1548    PT LONG TERM GOAL #1   Title The patient will report reduction in dizziness handicap index from 76% to < or equal to 50% to demonstrate improved self perception of dizziness and function.   Baseline Target date  01/15/2015   Time 8   Period Weeks   PT LONG TERM GOAL #2   Title The patient will improve gait speed to > or equal to 2.8 ft/sec indicating full community ambulator classification of gait.   Baseline Target date 01/15/2015   Time 8   Period Weeks   PT LONG TERM GOAL #3   Title The patient will improve sensory organization testing from 40% to > or equal to 60% for improved compensation for vestibular loss.   Baseline Target date 01/15/2015   Time 8   Period Weeks   PT LONG TERM GOAL #4   Title The patient will negotiate community surfaces (including grass, curbs, inclines) with least restrictive assistive device modified indep.   Baseline Target date 01/15/2015   Time 8   Period Weeks   PT LONG TERM GOAL #5   Title The patient will verbalize understanding of return to community exercise program for post d/c activities.   Baseline Target date 01/15/2015   Time 8   Period Weeks               Plan - 11/17/14 1553    Clinical Impression Statement The patient is a 64 yo female presenting to outpatient physical therapy today with impaired vestibular function (presents as bilateral vestibular loss clinically) with difficulty with turns, single limb stance and gait activities.  PT performed gait training today to improve the patient's safety and recommended a forearm crutch as opposed to small based quad cane as her speed of movement and sequencing with quad cane were unsafe.  The patient verbalizes an immediate improvement in comfort and confidence with gait with forearm crutch.     Pt will benefit from skilled therapeutic intervention in order to improve on the following deficits Abnormal gait;Decreased balance;Decreased mobility;Dizziness;Difficulty walking   Rehab Potential Good   PT Frequency 2x / week   PT Duration 8 weeks  PT Treatment/Interventions Balance training;Neuromuscular re-education;Vestibular;Therapeutic exercise;Therapeutic activities;Functional mobility  training;Patient/family education;Gait training;DME Instruction;Stair training   PT Next Visit Plan further assess/quantify vertigo, HEP balance based on SOT results (foam in corner, eyes closed on solid surface, head turns)   Consulted and Agree with Plan of Care Patient         Problem List Patient Active Problem List   Diagnosis Date Noted  . 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 Dizziness and giddiness 04/29/2014  . 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 Hearing loss sensory, bilateral 06/25/2012  . 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 Rudell Cobb, MPT  [place], PT 11/17/2014, 4:04 PM  [facility] [address] [place], [place], [address] Phone: [phone]   Fax:  [phone]  Name: Denise Rice MRN: [id number] Date of Birth: 19-Mar-1950

## 2014-11-22 ENCOUNTER — Ambulatory Visit: Payer: 59 | Admitting: Physical Therapy

## 2014-11-23 ENCOUNTER — Encounter: Payer: Self-pay | Admitting: Rehabilitative and Restorative Service Providers"

## 2014-11-24 ENCOUNTER — Ambulatory Visit: Payer: 59 | Admitting: Physical Therapy

## 2014-11-24 DIAGNOSIS — H832X3 Labyrinthine dysfunction, bilateral: Secondary | ICD-10-CM

## 2014-11-24 DIAGNOSIS — R269 Unspecified abnormalities of gait and mobility: Secondary | ICD-10-CM | POA: Diagnosis not present

## 2014-11-24 NOTE — Patient Instructions (Addendum)
Gaze Stabilization: Tip Card 1.Target must remain in focus, not blurry, and appear stationary while head is in motion. 2.Perform exercises with small head movements (45 to either side of midline). 3.Increase speed of head motion so long as target is in focus. 4.If you wear eyeglasses, be sure you can see target through lens (therapist will give specific instructions for bifocal / progressive lenses). 5.These exercises may provoke dizziness or nausea. Work through these symptoms. If too dizzy, slow head movement slightly. Rest between each exercise. 6.Exercises demand concentration; avoid distractions. 7.For safety, perform standing exercises close to a counter, wall, corner, or next to someone.  Gaze Stabilization: Standing Feet Apart   Feet shoulder width apart, keeping eyes on target on wall 3 feet away, tilt head down slightly and move head side to side for 45 seconds. Repeat while moving head up and down for 45 seconds. * Increase how far you're turning your head.  Do 2 sessions per day.   Feet Apart (Compliant Surface) Head Motion - Eyes Closed    Stand with back to corner with stable chair in front of you. Stand on 2 pillows with feet shoulder width apart. Close eyes and move head slowly, up and down; right to left; and diagonally (right/up to left/down as well as left/up to right/down) 10 times each. Repeat 2 times per session. Do _1-2___ sessions per day.   Feet Together (Compliant Surface) Varied Arm Positions - Eyes Closed    Stand with back to corner with stable chair in front of you.  Stand on 2 pillows with feet together and arms by your side. Close eyes and visualize upright position. Hold_30__ seconds. Repeat __4__ times per session. Do _1-2__ sessions per day.

## 2014-11-25 NOTE — Therapy (Signed)
[facility] [address] [place], [place], [address] Phone: [phone]   Fax:  [phone]  Physical Therapy Treatment  Patient Details  Name: Denise Rice MRN: [id number] Date of Birth: 25-Jan-1950 Referring Provider: Oletta Cohn, MD  Encounter Date: 11/24/2014      PT End of Session - 11/25/14 1245    Visit Number 2   Number of Visits 16   Date for PT Re-Evaluation 01/15/15   PT Start Time 1448   PT Stop Time 1530   PT Time Calculation (min) 42 min   Activity Tolerance Patient tolerated treatment well   Behavior During Therapy [facility] for tasks assessed/performed      Past Medical History  Diagnosis Date  . Anal condyloma   . History of peptic ulcer   . Otosclerosis of left ear   . Hearing loss, sensorineural, high frequency     RIGHT EAR  . 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 PCO (polycystic ovaries)   . Wears hearing aid     RIGHT EAR  . Deafness in left ear   . PONV (postoperative nausea and vomiting)     Past Surgical History  Procedure Laterality Date  . Vaginal hysterectomy  2007    fibroids  . Roux-en-y gastric bypass  AUG 2013  . Laparoscopic gastric banding  02-08-2007  . Strabismus surgery Right   . Laparoscopic cholecystectomy  03-31-2005  . Implantation bone anchored hearing aid Left 07-27-2008    left temporal bone  . Stapedes surgery Left 1985;  1994 x2;  02-03-1998  . Blepharoplasty  03-05-2000  . Laser ablation condolamata N/A 07/30/2012    Procedure: LASER ABLATION CONDOLAMATA;  Surgeon: Leighton Ruff, MD;  Location: [facility];  Service: General;  Laterality: N/A;  . Hammer toe surgery Left 08-29-2013    2nd toe  . Lasik Bilateral 2002    There were no vitals filed for this visit.  Visit Diagnosis:  Vestibular disequilibrium, bilateral      Subjective Assessment - 11/25/14 1242    Subjective Pt reports she is "just amazed" with how much  better she feels. Pt also surprised at how much easier gaze exercises have gotten over the past week. Pt very pleased with single forearm crutch.   Pertinent History She has also had concussion from a fall 05/2013.  She fell 4-5 times prior to surgery for R superior canal dehiscence.  She has h/o falls with injuries.  L side absent hearing has adaptive hearing device.   6 surgeries on L ear related to h/o otosclerosis. Patient had extensive evaluation to determine that R side was contributing to vertigo prior to surgery per report.     Patient Stated Goals Decrease dependence on the quad cane, return to prior exercise program, improve balance, reduce fatigue.   Currently in Pain? No/denies                         [facility] Adult PT Treatment/Exercise - 11/25/14 0001    Ambulation/Gait   Ambulation/Gait Yes   Ambulation/Gait Assistance 6: Modified independent (Device/Increase time)   Ambulation Distance (Feet) 120 Feet  x2   Assistive device R Forearm Crutch   Gait Pattern Step-through pattern;Within Functional Limits   Ambulation Surface Level;Indoor   Gait Comments Intermittent losses of balance (with effective self-recovery using forearm crutch) when pt quickly turned body.  Balance Exercises - 11/24/14 1536    Balance Exercises: Standing   Standing Eyes Opened Foam/compliant surface;Head turns;30 secs;4 reps;Wide (BOA);Other (comment)  pillows x2; no significant LOB; no postural sway   Standing Eyes Closed Narrow base of support (BOS);Wide (BOA);Head turns;Foam/compliant surface;4 reps;30 secs;Other (comment)  pillows x2; minimal postural sway           PT Education - 11/25/14 1244    Education provided Yes   Education Details HEP progressed; see Pt Instructions.   Person(s) Educated Patient   Methods Explanation;Demonstration;Verbal cues;Handout   Comprehension Verbalized understanding;Returned demonstration          PT Short Term Goals -  11/17/14 1544    PT SHORT TERM GOAL #1   Title The patient will be independent with HEP for gaze adaptation, visual compensatory strategies, and balance tasks.   Baseline Target date 12/16/2014   Time 4   Period Weeks   PT SHORT TERM GOAL #2   Title The patient will improve Berg score from 38/56 to > or equal to 45/56 to demo decreased risk for falls.   Baseline Target date 12/16/2014   Time 4   Period Weeks   PT SHORT TERM GOAL #3   Title The patient will improve gait speed from 1.94 ft/sec to > or equal to 2.4 ft/sec to demo improving functional mobility.   Baseline Target date 12/16/2014   Time 4   Period Weeks   PT SHORT TERM GOAL #4   Title The patient will improve sensory organization testing from 40% to > or equal to 52% to demo improving sensory use for balance.   Baseline Target date 12/16/2014   Time 4   Period Weeks           PT Long Term Goals - 11/17/14 1548    PT LONG TERM GOAL #1   Title The patient will report reduction in dizziness handicap index from 76% to < or equal to 50% to demonstrate improved self perception of dizziness and function.   Baseline Target date 01/15/2015   Time 8   Period Weeks   PT LONG TERM GOAL #2   Title The patient will improve gait speed to > or equal to 2.8 ft/sec indicating full community ambulator classification of gait.   Baseline Target date 01/15/2015   Time 8   Period Weeks   PT LONG TERM GOAL #3   Title The patient will improve sensory organization testing from 40% to > or equal to 60% for improved compensation for vestibular loss.   Baseline Target date 01/15/2015   Time 8   Period Weeks   PT LONG TERM GOAL #4   Title The patient will negotiate community surfaces (including grass, curbs, inclines) with least restrictive assistive device modified indep.   Baseline Target date 01/15/2015   Time 8   Period Weeks   PT LONG TERM GOAL #5   Title The patient will verbalize understanding of return to community exercise program for post  d/c activities.   Baseline Target date 01/15/2015   Time 8   Period Weeks               Plan - 11/25/14 1251    Clinical Impression Statement Session focused on progressing/expanding on HEP based on pt response to SOT. Pt reporting significant improvement in disequilibrium since beginning gaze exercises. Pt also perceives improvement in gait stability using R forearm crutch as opposed to [facility]. Progressed gaze stabilization and  added corner balance  exercises, all of which pt tolerated without disequlibrium or dizziness.   Pt will benefit from skilled therapeutic intervention in order to improve on the following deficits Abnormal gait;Decreased balance;Decreased mobility;Dizziness;Difficulty walking   Rehab Potential Good   PT Frequency 2x / week   PT Duration 8 weeks   PT Treatment/Interventions Balance training;Neuromuscular re-education;Vestibular;Therapeutic exercise;Therapeutic activities;Functional mobility training;Patient/family education;Gait training;DME Instruction;Stair training   PT Next Visit Plan Continue to assess/quantify vertigo. Progress gaze and corner balance exercises, as appropriate. May want to assess gait speed with forearm crutch (forgot to address this).   Consulted and Agree with Plan of Care Patient        Problem List Patient Active Problem List   Diagnosis Date Noted  . 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 Dizziness and giddiness 04/29/2014  . 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 Hearing loss sensory, bilateral 06/25/2012  . 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

## 2014-11-29 ENCOUNTER — Ambulatory Visit: Payer: 59 | Admitting: Rehabilitative and Restorative Service Providers"

## 2014-11-29 DIAGNOSIS — H832X3 Labyrinthine dysfunction, bilateral: Secondary | ICD-10-CM

## 2014-11-29 DIAGNOSIS — R269 Unspecified abnormalities of gait and mobility: Secondary | ICD-10-CM

## 2014-11-29 NOTE — Therapy (Signed)
[facility] [address] [place], [place], [address] Phone: [phone]   Fax:  [phone]  Physical Therapy Treatment  Patient Details  Name: Denise Rice MRN: [id number] Date of Birth: 22-Dec-1950 Referring Provider: Oletta Cohn, MD  Encounter Date: 11/29/2014      PT End of Session - 11/29/14 0940    Visit Number 3   Number of Visits 16   Date for PT Re-Evaluation 01/15/15   PT Start Time 0935   PT Stop Time 1015   PT Time Calculation (min) 40 min   Activity Tolerance Patient tolerated treatment well   Behavior During Therapy [facility] for tasks assessed/performed      Past Medical History  Diagnosis Date  . Anal condyloma   . History of peptic ulcer   . Otosclerosis of left ear   . Hearing loss, sensorineural, high frequency     RIGHT EAR  . 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 PCO (polycystic ovaries)   . Wears hearing aid     RIGHT EAR  . Deafness in left ear   . PONV (postoperative nausea and vomiting)     Past Surgical History  Procedure Laterality Date  . Vaginal hysterectomy  2007    fibroids  . Roux-en-y gastric bypass  AUG 2013  . Laparoscopic gastric banding  02-08-2007  . Strabismus surgery Right   . Laparoscopic cholecystectomy  03-31-2005  . Implantation bone anchored hearing aid Left 07-27-2008    left temporal bone  . Stapedes surgery Left 1985;  1994 x2;  02-03-1998  . Blepharoplasty  03-05-2000  . Laser ablation condolamata N/A 07/30/2012    Procedure: LASER ABLATION CONDOLAMATA;  Surgeon: Leighton Ruff, MD;  Location: [facility];  Service: General;  Laterality: N/A;  . Hammer toe surgery Left 08-29-2013    2nd toe  . Lasik Bilateral 2002    There were no vitals filed for this visit.  Visit Diagnosis:  Abnormality of gait  Vestibular disequilibrium, bilateral      Subjective Assessment - 11/29/14 0940    Subjective The patient reports this  week has been more challenging for her because she has returned to work full-time and is working long hours.  She notes her balance is worse when fatigued.  Turns continue to be challenging, and patient also reports difficulty bending to pick up objects from the floor.   Pertinent History s/p concussion from a fall 05/2013.  She fell 4-5 times prior to surgery for R superior canal dehiscence.  She has h/o falls with injuries.  L side absent hearing has adaptive hearing device.   6 surgeries on L ear related to h/o otosclerosis.   Patient Stated Goals Decrease dependence on the quad cane, return to prior exercise program, improve balance, reduce fatigue.   Currently in Pain? No/denies     NEUROMUSCULAR RE-EDUCATION: Reviewed gaze x 1 viewing exercises in horizontal and vertical planes x 30 seconds with verbal cues to slow pace to prevent visual blurring with head turn to the right (with observable corrective saccade to target), discussed slower speed or smaller ROM until letter remains focused.  Educated patient that she can increase speed but only if target remains clear/stable.  Standing quarter turns>1/2 turns>full turns with CGA due to instability.  Educated patient on compensatory strategies of eyes + head turn and fixate on target, then body turns.  Patient initially performed with quarter turns and worked up to full turns without  loss of balance to R and L sides x 5 reps each.  Patient reports no dizziness when fixating on a target with turns.  Corner balance exercises including feet tandem with eyes open decreasing UE support, partial heel/toe with eyes closed with CGA for safety, and foam standing with feet apart and eyes closed with SBA for safety.  Progressed to feet together on foam with eyes closed using visual imagery for midline orientation.   Imaginary targets with head turns for improved use of visual system/spatial awareness for turns.  THERAPEUTIC ACTIVITIES: Patient c/o difficulty  picking up after her dog during morning walk (dark outdoors).  She demonstrated leaning against one forearm crutch with right elbow, while holding iphone for flashlight in her right hand and then bending down.  She reports added dynamic task of her dog pulling at the leash around her L arm.   PT recommended and demonstrated a staggered wide base of support with feet, using forearm crutch (with right hand holding beneath the hand grip) as a third point for stability, and clipping the phone to her belt or using a camping flashlight that clips to her clothing.   The patient demonstrated this task multiple times without loss of balance.    Gait: 83M walk test with one forearm crutch=3.18 ft/sec (0.97 m/s) meeting STG and LTG.   Dynamic gait activities incuding vertical and horizontal head turns performed x 50 ft x 2 reps each with slowed gait pattern noticed Figure 8 walking emphasizing slowed pace with turns and fixating on a target for compensatory strategies.        PT Education - 11/29/14 1430    Education provided Yes   Education Details Discussed safer bending strategies and modifications to use when outdoors in the dark (iphone clip to belt)   Person(s) Educated Patient   Methods Explanation;Demonstration   Comprehension Verbalized understanding;Returned demonstration          PT Short Term Goals - 11/29/14 1442    PT SHORT TERM GOAL #1   Title The patient will be independent with HEP for gaze adaptation, visual compensatory strategies, and balance tasks.   Baseline Target date 12/16/2014   Time 4   Period Weeks   Status On-going   PT SHORT TERM GOAL #2   Title The patient will improve Berg score from 38/56 to > or equal to 45/56 to demo decreased risk for falls.   Baseline Target date 12/16/2014   Time 4   Period Weeks   Status On-going   PT SHORT TERM GOAL #3   Title The patient will improve gait speed from 1.94 ft/sec to > or equal to 2.4 ft/sec to demo improving  functional mobility.   Baseline Patient improved from 1.94 ft/sec at evaluation to 3.18 ft/sec (0.97 m/s)   Time 4   Period Weeks   Status Achieved   PT SHORT TERM GOAL #4   Title The patient will improve sensory organization testing from 40% to > or equal to 52% to demo improving sensory use for balance.   Baseline Target date 12/16/2014   Time 4   Period Weeks   Status On-going           PT Long Term Goals - 11/29/14 1443    PT LONG TERM GOAL #1   Title The patient will report reduction in dizziness handicap index from 76% to < or equal to 50% to demonstrate improved self perception of dizziness and function.   Baseline Target date 01/15/2015  Time 8   Period Weeks   Status On-going   PT LONG TERM GOAL #2   Title The patient will improve gait speed to > or equal to 2.8 ft/sec indicating full community ambulator classification of gait.   Baseline Pt met on 11/29/2014-see STGs.   Time 8   Period Weeks   Status Achieved   PT LONG TERM GOAL #3   Title The patient will improve sensory organization testing from 40% to > or equal to 60% for improved compensation for vestibular loss.   Baseline Target date 01/15/2015   Time 8   Period Weeks   Status On-going   PT LONG TERM GOAL #4   Title The patient will negotiate community surfaces (including grass, curbs, inclines) with least restrictive assistive device modified indep.   Baseline Target date 01/15/2015   Time 8   Period Weeks   Status On-going   PT LONG TERM GOAL #5   Title The patient will verbalize understanding of return to community exercise program for post d/c activities.   Baseline Target date 01/15/2015   Time 8   Period Weeks   Status On-going          Plan - 11/29/14 1444    Clinical Impression Statement The patient met STG and LTG for gait velocity.  The patient is tolerating HEP demonstrating significant improvement in tolerance to gaze stabilization activities.  Patient is walking daily for exercise and has  returned to full work schedule.  PT is decreasing to 1x/week as patient is making significant progress and is motivated to perform HEP daily.     Pt will benefit from skilled therapeutic intervention in order to improve on the following deficits Abnormal gait;Decreased balance;Decreased mobility;Dizziness;Difficulty walking   Rehab Potential Good   PT Frequency 2x / week   PT Duration 8 weeks   PT Treatment/Interventions Balance training;Neuromuscular re-education;Vestibular;Therapeutic exercise;Therapeutic activities;Functional mobility training;Patient/family education;Gait training;DME Instruction;Stair training   PT Next Visit Plan Progress HEP (duration of gaze x 1 viewing, foot position), dynamic gait activities, multi-sensory balance training, progress to no device *patient may bring in small heeled dress shoe to try in therapy   Consulted and Agree with Plan of Care Patient        Problem List Patient Active Problem List   Diagnosis Date Noted  . 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 Dizziness and giddiness 04/29/2014  . 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 Hearing loss sensory, bilateral 06/25/2012  . Cochlear implant in place 06/25/2012    Julez Huseby, PT 11/29/2014, 6:43 PM  [facility] [address] [place], [place], [address] Phone: [phone]   Fax:  [phone]  Name: Denise Rice MRN: [id number] Date of Birth: [birth year]

## 2014-12-05 ENCOUNTER — Ambulatory Visit: Payer: 59 | Admitting: Rehabilitative and Restorative Service Providers"

## 2014-12-05 DIAGNOSIS — H832X3 Labyrinthine dysfunction, bilateral: Secondary | ICD-10-CM

## 2014-12-05 DIAGNOSIS — R269 Unspecified abnormalities of gait and mobility: Secondary | ICD-10-CM

## 2014-12-05 NOTE — Therapy (Signed)
[facility] [address] [place], [place], [address] Phone: [phone]   Fax:  [phone]  Physical Therapy Treatment  Patient Details  Name: Denise Rice MRN: [id number] Date of Birth: 12/15/1950 Referring Provider: Oletta Cohn, MD  Encounter Date: 12/05/2014      PT End of Session - 12/05/14 1219    Visit Number 4   Number of Visits 16   Date for PT Re-Evaluation 01/15/15   Authorization Type private insurance $20 copay   PT Start Time 1020   PT Stop Time 1108   PT Time Calculation (min) 48 min   Equipment Utilized During Treatment Gait belt   Activity Tolerance Patient tolerated treatment well   Behavior During Therapy [facility] for tasks assessed/performed      Past Medical History  Diagnosis Date  . Anal condyloma   . History of peptic ulcer   . Otosclerosis of left ear   . Hearing loss, sensorineural, high frequency     RIGHT EAR  . 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 PCO (polycystic ovaries)   . Wears hearing aid     RIGHT EAR  . Deafness in left ear   . PONV (postoperative nausea and vomiting)     Past Surgical History  Procedure Laterality Date  . Vaginal hysterectomy  2007    fibroids  . Roux-en-y gastric bypass  AUG 2013  . Laparoscopic gastric banding  02-08-2007  . Strabismus surgery Right   . Laparoscopic cholecystectomy  03-31-2005  . Implantation bone anchored hearing aid Left 07-27-2008    left temporal bone  . Stapedes surgery Left 1985;  1994 x2;  02-03-1998  . Blepharoplasty  03-05-2000  . Laser ablation condolamata N/A 07/30/2012    Procedure: LASER ABLATION CONDOLAMATA;  Surgeon: Leighton Ruff, MD;  Location: [facility];  Service: General;  Laterality: N/A;  . Hammer toe surgery Left 08-29-2013    2nd toe  . Lasik Bilateral 2002    There were no vitals filed for this visit.  Visit Diagnosis:  Abnormality of gait  Vestibular  disequilibrium, bilateral      Subjective Assessment - 12/05/14 1215    Subjective The patient arrived wearing high heels to therapy in order to practice walking/balancing in different footwear.   Patient feels she is continuing to show significant improvement between visits.   Currently in Pain? No/denies      NEUROMUSCULAR RE-EDUCATION: Standing balance near countertop for support with CGA to min A: Feet apart eyes closed in high heels Feet together and eyes closed x 20 seconds with cues on imaginary targets Feet partial heel/toe with eyes open and eyes closed Standing quarter turns>1/2 turns>full turns with cues on visual spotting for compensatory strategies Gaze x 1 standing feet apart emphasizing speed of movement and recommended work on speed x 2-3 days, then increase length of time to perform Turns standing in place with ball pass R<>L x 6 reps each side Sensory Organization Testing=33% compared to age/height normative value of 65%.   Patient demonstrated WNLs use of somatosensory feedback for balance, diminished use of visual feedback for balance, and diminished use of vestibular feedback for balance.  Gait: Ambulation without forearm crutch in high heels with CGA adding horizontal head turns every 4 steps  Gait with high heels and forearm crutch modified indep progressing to add 180 degree turns and then decreasing use of forearm crutch for support      PT Education -  12/05/14 1217    Education provided Yes   Education Details progress gaze in speed x 2 days, then begin progressing time to perform (towards 1 minute)   Person(s) Educated Patient   Methods Explanation;Demonstration   Comprehension Verbalized understanding;Returned demonstration          PT Short Term Goals - 12/05/14 1221    PT SHORT TERM GOAL #1   Title The patient will be independent with HEP for gaze adaptation, visual compensatory strategies, and balance tasks.   Baseline Target date 12/16/2014    Time 4   Period Weeks   Status On-going   PT SHORT TERM GOAL #2   Title The patient will improve Berg score from 38/56 to > or equal to 45/56 to demo decreased risk for falls.   Baseline Target date 12/16/2014   Time 4   Period Weeks   Status On-going   PT SHORT TERM GOAL #3   Title The patient will improve gait speed from 1.94 ft/sec to > or equal to 2.4 ft/sec to demo improving functional mobility.   Baseline Patient improved from 1.94 ft/sec at evaluation to 3.18 ft/sec (0.97 m/s)   Time 4   Period Weeks   Status Achieved   PT SHORT TERM GOAL #4   Title The patient will improve sensory organization testing from 40% to > or equal to 52% to demo improving sensory use for balance.   Baseline Target date 12/16/2014   Time 4   Period Weeks   Status On-going           PT Long Term Goals - 11/29/14 1443    PT LONG TERM GOAL #1   Title The patient will report reduction in dizziness handicap index from 76% to < or equal to 50% to demonstrate improved self perception of dizziness and function.   Baseline Target date 01/15/2015   Time 8   Period Weeks   Status On-going   PT LONG TERM GOAL #2   Title The patient will improve gait speed to > or equal to 2.8 ft/sec indicating full community ambulator classification of gait.   Baseline Pt met on 11/29/2014-see STGs.   Time 8   Period Weeks   Status Achieved   PT LONG TERM GOAL #3   Title The patient will improve sensory organization testing from 40% to > or equal to 60% for improved compensation for vestibular loss.   Baseline Target date 01/15/2015   Time 8   Period Weeks   Status On-going   PT LONG TERM GOAL #4   Title The patient will negotiate community surfaces (including grass, curbs, inclines) with least restrictive assistive device modified indep.   Baseline Target date 01/15/2015   Time 8   Period Weeks   Status On-going   PT LONG TERM GOAL #5   Title The patient will verbalize understanding of return to community exercise  program for post d/c activities.   Baseline Target date 01/15/2015   Time 8   Period Weeks   Status On-going               Plan - 12/05/14 1221    Clinical Impression Statement The patient did not perform as well today on sensory organization testing, although has significantly improved in how she is functioning in day to day activities.  Patient will benefit from further training on compliant surfaces to work on multi-sensory use in varying conditions.   PT Next Visit Plan *ankle plantar flexion in single  limb stance, compliant surfaces.Progress HEP (duration of gaze x 1 viewing, foot position), dynamic gait activities, multi-sensory balance training, progress to no device    Consulted and Agree with Plan of Care Patient        Problem List Patient Active Problem List   Diagnosis Date Noted  . 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 Dizziness and giddiness 04/29/2014  . 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 Hearing loss sensory, bilateral 06/25/2012  . Cochlear implant in place 06/25/2012    Naod Sweetland, PT 12/05/2014, 12:23 PM  [facility] [address] [place], [place], [address] Phone: [phone]   Fax:  [phone]  Name: Denise Rice MRN: [id number] Date of Birth: [birth year]

## 2014-12-12 ENCOUNTER — Ambulatory Visit: Payer: 59 | Admitting: Rehabilitative and Restorative Service Providers"

## 2014-12-12 DIAGNOSIS — R269 Unspecified abnormalities of gait and mobility: Secondary | ICD-10-CM

## 2014-12-12 DIAGNOSIS — H832X3 Labyrinthine dysfunction, bilateral: Secondary | ICD-10-CM

## 2014-12-12 NOTE — Therapy (Signed)
[facility] [address] [place], [place], [address] Phone: [phone]   Fax:  [phone]  Physical Therapy Treatment  Patient Details  Name: Denise Rice MRN: [id number] Date of Birth: 10-04-1950 Referring Provider: Oletta Cohn, MD  Encounter Date: 12/12/2014      PT End of Session - 12/12/14 1355    Visit Number 5   Number of Visits 16   Date for PT Re-Evaluation 01/15/15   Authorization Type private insurance $20 copay   PT Start Time 0601   PT Stop Time 1320   PT Time Calculation (min) 45 min   Equipment Utilized During Treatment Gait belt   Activity Tolerance Patient tolerated treatment well   Behavior During Therapy [facility] for tasks assessed/performed      Past Medical History  Diagnosis Date  . Anal condyloma   . History of peptic ulcer   . Otosclerosis of left ear   . Hearing loss, sensorineural, high frequency     RIGHT EAR  . 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 PCO (polycystic ovaries)   . Wears hearing aid     RIGHT EAR  . Deafness in left ear   . PONV (postoperative nausea and vomiting)     Past Surgical History  Procedure Laterality Date  . Vaginal hysterectomy  2007    fibroids  . Roux-en-y gastric bypass  AUG 2013  . Laparoscopic gastric banding  02-08-2007  . Strabismus surgery Right   . Laparoscopic cholecystectomy  03-31-2005  . Implantation bone anchored hearing aid Left 07-27-2008    left temporal bone  . Stapedes surgery Left 1985;  1994 x2;  02-03-1998  . Blepharoplasty  03-05-2000  . Laser ablation condolamata N/A 07/30/2012    Procedure: LASER ABLATION CONDOLAMATA;  Surgeon: Leighton Ruff, MD;  Location: [facility];  Service: General;  Laterality: N/A;  . Hammer toe surgery Left 08-29-2013    2nd toe  . Lasik Bilateral 2002    There were no vitals filed for this visit.  Visit Diagnosis:  Abnormality of gait  Vestibular  disequilibrium, bilateral      Subjective Assessment - 12/12/14 1359    Subjective The patient is not using forearm crutch in well-lit, level environments.  PT provided patient with copy of forearm crutch referral.  She will return clinic forearm crutch once she obtains her own pair.   Patient Stated Goals Decrease dependence on the quad cane, return to prior exercise program, improve balance, reduce fatigue.   Currently in Pain? No/denies      NEUROMUSCULAR RE-EDUCATION: Foam standing with feet apart + eyes closed progressing to feet together using visual targets even with eyes closed to improve performance Rocker board standing tasks with reactive balance (unpredicted perturbations by therapist) with CGA to min A, head turns horizontal, vertical and diagonal and eyes closed with feet apart with min A Compliant mat surface with cone taps emphasizing lateral weight shift and postural control, marching in place with CGA, quarter to 1/2 turns with visual fixation/compensatory strategies, figure 8 walking with visual cues, partial heel toe and feet together eyes closed activities.  Compliant surfaces with head turns in all planes with CGA.  THERAPEUTIC EXERCISE: Ankle exercises added to HEP for bilateral and unilateral stance near countertop for support. Discussed return to community exercise with slight modifications (near wall, go at slow pace, begin on weekend due to fatigue)  Gait: Ambulation without a device performing gaze x 1 adaptation  exercises x 50 feet, Gait in therapy without a device with supervision to modified indep on level surfaes       PT Education - 12/12/14 1355    Education provided Yes   Education Details HEP: heel raises bilateral, heel raises unilateral, figure 8 turns on solid surface.  Recommended return to spin class to tolerance (slower pace to ease into working out).   Person(s) Educated Patient   Methods Explanation;Demonstration;Handout   Comprehension  Verbalized understanding;Returned demonstration          PT Short Term Goals - 12/12/14 1356    PT SHORT TERM GOAL #1   Title The patient will be independent with HEP for gaze adaptation, visual compensatory strategies, and balance tasks.   Baseline Met on 12/12/2014   Time 4   Period Weeks   Status Achieved   PT SHORT TERM GOAL #2   Title The patient will improve Berg score from 38/56 to > or equal to 45/56 to demo decreased risk for falls.   Baseline Target date 12/16/2014   Time 4   Period Weeks   Status On-going   PT SHORT TERM GOAL #3   Title The patient will improve gait speed from 1.94 ft/sec to > or equal to 2.4 ft/sec to demo improving functional mobility.   Baseline Patient improved from 1.94 ft/sec at evaluation to 3.18 ft/sec (0.97 m/s)   Time 4   Period Weeks   Status Achieved   PT SHORT TERM GOAL #4   Title The patient will improve sensory organization testing from 40% to > or equal to 52% to demo improving sensory use for balance.   Baseline Target date 12/16/2014   Time 4   Period Weeks   Status On-going           PT Long Term Goals - 11/29/14 1443    PT LONG TERM GOAL #1   Title The patient will report reduction in dizziness handicap index from 76% to < or equal to 50% to demonstrate improved self perception of dizziness and function.   Baseline Target date 01/15/2015   Time 8   Period Weeks   Status On-going   PT LONG TERM GOAL #2   Title The patient will improve gait speed to > or equal to 2.8 ft/sec indicating full community ambulator classification of gait.   Baseline Pt met on 11/29/2014-see STGs.   Time 8   Period Weeks   Status Achieved   PT LONG TERM GOAL #3   Title The patient will improve sensory organization testing from 40% to > or equal to 60% for improved compensation for vestibular loss.   Baseline Target date 01/15/2015   Time 8   Period Weeks   Status On-going   PT LONG TERM GOAL #4   Title The patient will negotiate community  surfaces (including grass, curbs, inclines) with least restrictive assistive device modified indep.   Baseline Target date 01/15/2015   Time 8   Period Weeks   Status On-going   PT LONG TERM GOAL #5   Title The patient will verbalize understanding of return to community exercise program for post d/c activities.   Baseline Target date 01/15/2015   Time 8   Period Weeks   Status On-going               Plan - 12/12/14 1356    Clinical Impression Statement The patient met STG for HEP.  She is now using forearm crutch only when fatigued  and in the dark.  Patient has difficulty in therapy with compliant surface weight shifting, turns and rocker board activities.  PT to progress difficulty of activities to patient tolerance.  Patient safe to return to spin class- recommended slower pace and near a wall for support as she gets onto bike.   PT Next Visit Plan Check new HEP, gaze x 1 foot position progression, recheck SOT and progress rocker board and foam activities.   Consulted and Agree with Plan of Care Patient        Problem List Patient Active Problem List   Diagnosis Date Noted  . 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 Dizziness and giddiness 04/29/2014  . 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 Hearing loss sensory, bilateral 06/25/2012  . Cochlear implant in place 06/25/2012    Deonta Bomberger, PT 12/12/2014, 1:59 PM  [facility] [address] [place], [place], [address] Phone: [phone]   Fax:  [phone]  Name: Denise Rice MRN: [id number] Date of Birth: 11-19-1950

## 2014-12-12 NOTE — Patient Instructions (Signed)
Heel Raise: Bilateral (Standing)    Near a support surface *may want a full length mirror or someone to watch to ensure you keep ankles in good alignment.  Rise on balls of feet. Repeat _20___ times per set. Do ___1_ sets per session. Do __1-2__ sessions per day.  http://orth.exer.us/39   Copyright  VHI. All rights reserved.  Heel Raise: Unilateral (Standing)    Near countertop for support *can hold on for balance.  Balance on left foot, then rise on ball of foot. Repeat _10___ times on each side per set. Do __1__ sets per session. Do _1-2___ sessions per day.  http://orth.exer.us/41   Copyright  VHI. All rights reserved.   Walking Figure Eight    Practice walking in figure eight. Start with large figures. Make it more difficult by walking in a smaller figure eight. Repeat _5___ times. Do _1-2___ sessions per day.  http://gt2.exer.us/538   Copyright  VHI. All rights reserved.   OTHER REMINDERS: 1) Use thicker foam for pillow standing exercises (from couch/sofa cushion) 2) Continue to progress gaze (letter) exercise from 45 seconds to a minute as able.

## 2014-12-18 ENCOUNTER — Encounter: Payer: Self-pay | Admitting: Neurology

## 2014-12-18 ENCOUNTER — Ambulatory Visit (INDEPENDENT_AMBULATORY_CARE_PROVIDER_SITE_OTHER): Payer: 59 | Admitting: Neurology

## 2014-12-18 VITALS — BP 142/85 | HR 68 | Ht 61.5 in | Wt 159.4 lb

## 2014-12-18 DIAGNOSIS — E878 Other disorders of electrolyte and fluid balance, not elsewhere classified: Secondary | ICD-10-CM

## 2014-12-18 DIAGNOSIS — R42 Dizziness and giddiness: Secondary | ICD-10-CM

## 2014-12-18 DIAGNOSIS — H838X1 Other specified diseases of right inner ear: Secondary | ICD-10-CM | POA: Diagnosis not present

## 2014-12-18 NOTE — Progress Notes (Signed)
[facility]    Provider:  Dr Jaynee Eagles Referring Provider: Fanny Bien, MD Primary Care Physician:  Rachell Cipro, MD  CC:  dizziness  Interval Update 12/18/2014: Denise Rice is a lovely 64 y.o. female here as a referral from Dr. 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 She had her surgery. After about 8 weeks her hearing improved and her vertigo improved. She is in rehab for her vertigo. She is hearing better. She tires very easily. No falls. She still has dizziness but nbithing significant.  HPI: Denise Rice is a lovely 64 y.o. female here as a referral from Dr. 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 She has had multiple falls, most recently in her kitchen. She does not remember falling. She was facing the fridge and truned around and next thing she remembers she was on the ground. She did not proceed to the ED. She had a split lip and there was blood everywhere. Her nose is hurting on the bridge but reports no further alteration of consciousness or confusion. She has been having headaches since hitting her head but they are improving. No irritability, no decreased concentration, no increase in dizziness, no further memory changes or other post-concussive symptma. The headaches feel dull throbbing. She is better, hasn;t had one in a few days. This is the first time she has fallen and has no recollection of the fall. Discussed at length, it appears as though she may have hit her head pretty hard on the floor and possibly cause some memory loss. I did encourage her to go to the emergency room next time for evaluation of intracranial hematomas as this can be a serious complication of falls.   Patient is scheduled for repair of the semicircular canals. She has been told she may be dizzier afterwards but over the long term her surgeon is hopeful it will help. She had her  first surgery in 1970. She is using a cane. She feels good today. Yesterday she also felt well. Walking makes her feel worse. The Xanax 1mg  twice a day helps a lot for the disequilibrium. The scopolamine patch helped but was too expensive. She will be admitted on July 22nd to [facility]. She takes [place] every night. Discussed that at this time it's important for her to concentrate on her surgery and get better. After her surgery and recovery, we can discuss the Xanax and Ambien in the evenings and see if we can decrease that. If she has any more episodes of falls with loss of consciousness. May need a cardiac evaluation for abnormal arrhythmias  Review of Systems: Patient complains of symptoms per HPI as well as the following symptoms: hearing loss, fatigue, dizziness. Pertinent negatives per HPI. All others negative.   Social History   Social History  . Marital Status: Married    Spouse Name: Vicenta Dunning   . Number of Children: 1  . Years of Education: MBA   Occupational History  . Director of [employer]   Social History Main Topics  . Smoking status: Never Smoker   . Smokeless tobacco: Never Used  . Alcohol Use: 0.0 oz/week    0 Standard drinks or equivalent per week     Comment: 1 glass wine/6 months  . Drug Use: No  . Sexual Activity: Not on file   Other Topics Concern  . Not on file   Social  History Narrative   Lives at home with husband and daughter.   Left handed.   Caffeine use: 2-3 cups (coffee) per day     Family History  Problem Relation Age of Onset  . Heart attack Mother   . Heart disease Mother   . Cancer Father     unknown primary  . Diabetes Maternal Aunt   . Heart disease Maternal Aunt   . Cancer Paternal Aunt   . Cancer Paternal Uncle   . Stroke Sister     Past Medical History  Diagnosis Date  . Anal condyloma   . History of peptic ulcer   . Otosclerosis of left ear   . Hearing loss, sensorineural, high frequency       RIGHT EAR  . 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 PCO (polycystic ovaries)   . Wears hearing aid     RIGHT EAR  . Deafness in left ear   . PONV (postoperative nausea and vomiting)     Past Surgical History  Procedure Laterality Date  . Vaginal hysterectomy  2007    fibroids  . Roux-en-y gastric bypass  AUG 2013  . Laparoscopic gastric banding  02-08-2007  . Strabismus surgery Right   . Laparoscopic cholecystectomy  03-31-2005  . Implantation bone anchored hearing aid Left 07-27-2008    left temporal bone  . Stapedes surgery Left 1985;  1994 x2;  02-03-1998  . Blepharoplasty  03-05-2000  . Laser ablation condolamata N/A 07/30/2012    Procedure: LASER ABLATION CONDOLAMATA;  Surgeon: Leighton Ruff, MD;  Location: [facility];  Service: General;  Laterality: N/A;  . Hammer toe surgery Left 08-29-2013    2nd toe  . Lasik Bilateral 2002    Current Outpatient Prescriptions  Medication Sig Dispense Refill  . ALPRAZolam (XANAX) 1 MG tablet TAKE 1 TABLET BY MOUTH 3 TIMES DAILY AS NEEDED FOR ANXIETY 90 tablet 4  . Biotin 5000 MCG CAPS Take by mouth daily.    . Calcium Carbonate-Vitamin D (CALCIUM 600 + D PO) Take 1 tablet by mouth daily.     . cyanocobalamin 2000 MCG tablet Take 2,000 mcg by mouth daily.    Marland Kitchen escitalopram (LEXAPRO) 10 MG tablet Take 1 tablet (10 mg total) by mouth daily. (Patient taking differently: Take 5 mg by mouth daily. ) 90 tablet 3  . ferrous sulfate 325 (65 FE) MG tablet Take 325 mg by mouth daily with breakfast.    . ibuprofen (ADVIL,MOTRIN) 600 MG tablet Take 1 tablet (600 mg total) by mouth every 6 (six) hours as needed. 30 tablet 0  . Multiple Vitamins-Minerals (MULTIVITAMIN ADULT PO) Take 1 tablet by mouth.    . zolpidem (AMBIEN) 10 MG tablet TAKE 1 TABLET BY MOUTH AT BEDTIME AS NEEDED 30 tablet 2   No current facility-administered medications for this visit.    Allergies as of 12/18/2014  . (No Known Allergies)     Vitals: Wt 159 lb 6.4 oz (72.303 kg)  LMP 01/13/2005 Last Weight:  Wt Readings from Last 1 Encounters:  12/18/14 159 lb 6.4 oz (72.303 kg)   Last Height:   Ht Readings from Last 1 Encounters:  08/01/14 5' 1.5" (1.562 m)       Cranial Nerves:  The pupils are equal, round, and reactive to light. Visual fields are full. Extraocular movements are intact. Nystagmus on right end gaze with fast component to the right. The face is symmetric. Hearing impaired. Voice is normal.  Coordination:  No dysmetria  Gait:  No ataxia   Motor Observation:  No asymmetry, no atrophy, and no involuntary movements noted.   Posture:  Posture is normal.      Assessment/Plan: 64 year old female with dehiscence of the superior semicircular canals and chronic disequilibrium syndrome s/p surgery and doing well.   Recent fall with brief loss of consciousness: She had headaches after the head trauma, which are improving. No other symptoms of postconcussive syndrome. Advised her to proceed to the emergency room should this happen in the future due to possibility of intracranial hemorrhage.   disequilibrium syndrome: continue vestibular therapy Follow up as needed  Sarina Ill, MD  [facility] [address] [place], [place] [address]  Phone [phone] Fax [phone]  A total of 25 minutes was spent face-to-face with this patient. Over half this time was spent on counseling patient on the superior semicircular canals and chronic disequilibrium syndrome diagnosis and different diagnostic and therapeutic options available.

## 2014-12-18 NOTE — Patient Instructions (Signed)

## 2014-12-19 ENCOUNTER — Ambulatory Visit: Payer: 59 | Admitting: Rehabilitative and Restorative Service Providers"

## 2014-12-25 ENCOUNTER — Ambulatory Visit: Payer: Self-pay | Admitting: Neurology

## 2014-12-26 ENCOUNTER — Ambulatory Visit: Payer: 59 | Attending: Otolaryngology | Admitting: Rehabilitative and Restorative Service Providers"

## 2014-12-26 DIAGNOSIS — R269 Unspecified abnormalities of gait and mobility: Secondary | ICD-10-CM | POA: Diagnosis present

## 2014-12-26 DIAGNOSIS — H832X3 Labyrinthine dysfunction, bilateral: Secondary | ICD-10-CM | POA: Diagnosis present

## 2014-12-26 NOTE — Therapy (Signed)
[facility] [address] [place], [place], [address] Phone: [phone]   Fax:  [phone]  Physical Therapy Treatment  Patient Details  Name: Denise Rice MRN: [id number] Date of Birth: July 26, 1950 Referring Provider: Oletta Cohn, MD  Encounter Date: 12/26/2014      PT End of Session - 12/26/14 1341    Visit Number 6   Number of Visits 16   Date for PT Re-Evaluation 01/15/15   Authorization Type private insurance $20 copay   PT Start Time [phone]   PT Stop Time 0935   PT Time Calculation (min) 45 min   Equipment Utilized During Treatment Gait belt   Activity Tolerance Patient tolerated treatment well   Behavior During Therapy [facility] for tasks assessed/performed      Past Medical History  Diagnosis Date  . Anal condyloma   . History of peptic ulcer   . Otosclerosis of left ear   . Hearing loss, sensorineural, high frequency     RIGHT EAR  . 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 PCO (polycystic ovaries)   . Wears hearing aid     RIGHT EAR  . Deafness in left ear   . PONV (postoperative nausea and vomiting)     Past Surgical History  Procedure Laterality Date  . Vaginal hysterectomy  2007    fibroids  . Roux-en-y gastric bypass  AUG 2013  . Laparoscopic gastric banding  02-08-2007  . Strabismus surgery Right   . Laparoscopic cholecystectomy  03-31-2005  . Implantation bone anchored hearing aid Left 07-27-2008    left temporal bone  . Stapedes surgery Left 1985;  1994 x2;  02-03-1998  . Blepharoplasty  03-05-2000  . Laser ablation condolamata N/A 07/30/2012    Procedure: LASER ABLATION CONDOLAMATA;  Surgeon: Leighton Ruff, MD;  Location: [facility];  Service: General;  Laterality: N/A;  . Hammer toe surgery Left 08-29-2013    2nd toe  . Lasik Bilateral 2002    There were no vitals filed for this visit.  Visit Diagnosis:  Abnormality of gait  Vestibular  disequilibrium, bilateral      Subjective Assessment - 12/26/14 0854    Subjective The patient reports she is walking without a device at all times.  She reports that she has occasional losses of balance, but overall feels that she has improved significantly since beginning PT.   Patient Stated Goals Decrease dependence on the quad cane, return to prior exercise program, improve balance, reduce fatigue.   Currently in Pain? No/denies         [facility] PT Assessment - 12/26/14 0856    Ambulation/Gait   Ambulation/Gait Yes   Ambulation/Gait Assistance 7: Independent   Ambulation Distance (Feet) --  community distances   Assistive device None   Ambulation Surface Level   Gait velocity 4.0 ft/sec   Standardized Balance Assessment   Standardized Balance Assessment Berg Balance Test   Berg Balance Test   Sit to Stand Able to stand without using hands and stabilize independently   Standing Unsupported Able to stand safely 2 minutes   Sitting with Back Unsupported but Feet Supported on Floor or Stool Able to sit safely and securely 2 minutes   Stand to Sit Sits safely with minimal use of hands   Transfers Able to transfer safely, minor use of hands   Standing Unsupported with Eyes Closed Able to stand 10 seconds safely   Standing Ubsupported with Feet Together Able to  place feet together independently and stand 1 minute safely   From Standing, Reach Forward with Outstretched Arm Can reach confidently >25 cm (10")   From Standing Position, Pick up Object from [place] to pick up shoe safely and easily   From Standing Position, Turn to Look Behind Over each Shoulder Looks behind from both sides and weight shifts well   Turn 360 Degrees Able to turn 360 degrees safely in 4 seconds or less   Standing Unsupported, Alternately Place Feet on Step/Stool Able to stand independently and safely and complete 8 steps in 20 seconds   Standing Unsupported, One Foot in Front Able to plae foot ahead of the  other independently and hold 30 seconds   Standing on One Leg Able to lift leg independently and hold equal to or more than 3 seconds   Total Score 53   Berg comment: 53/56 improved from 38/56.      NEUROMUSCULAR RE-EDUCATION: Berg=53/56 Sensory Organization Testing=51% compared to age/height normative value of 70%.   Patient demonstrated WNLs use of somatosensory feedback for balance, WNLs use of visual feedback for balance, and significantly impaired use of vestibular feedback for balance.  rocker board at wall with wall bumps, eyes open + head turns with CGA and eyes closed with min to mod A Patient does not note she is losing balance until falling and has slow corrective reflexes/strategies PT and patient discussed doing this exercise at home ONLY if family member knows they will have to help catch her (she wants to obtain a rocker board)       PT Short Term Goals - 12/26/14 1342    PT SHORT TERM GOAL #1   Title The patient will be independent with HEP for gaze adaptation, visual compensatory strategies, and balance tasks.   Baseline Met on 12/12/2014   Time 4   Period Weeks   Status Achieved   PT SHORT TERM GOAL #2   Title The patient will improve Berg score from 38/56 to > or equal to 45/56 to demo decreased risk for falls.   Baseline Pt scores 53/56 on 12/26/2014   Time 4   Period Weeks   Status Achieved   PT SHORT TERM GOAL #3   Title The patient will improve gait speed from 1.94 ft/sec to > or equal to 2.4 ft/sec to demo improving functional mobility.   Baseline imrpoved to 4.0 ft/sec on 12/26/2014   Time 4   Period Weeks   Status Achieved   PT SHORT TERM GOAL #4   Title The patient will improve sensory organization testing from 40% to > or equal to 52% to demo improving sensory use for balance.   Baseline Pt improved to 51% on sensory organization testing   Time 4   Period Weeks   Status Partially Met           PT Long Term Goals - 12/26/14 1342    PT LONG  TERM GOAL #1   Title The patient will report reduction in dizziness handicap index from 76% to < or equal to 50% to demonstrate improved self perception of dizziness and function.   Baseline Target date 01/15/2015   Time 8   Period Weeks   Status On-going   PT LONG TERM GOAL #2   Title The patient will improve gait speed to > or equal to 2.8 ft/sec indicating full community ambulator classification of gait.   Baseline 4.0 ft/sec on 12/26/2014   Time 8   Period  Weeks   Status Achieved   PT LONG TERM GOAL #3   Title The patient will improve sensory organization testing from 40% to > or equal to 60% for improved compensation for vestibular loss.   Baseline Target date 01/15/2015   Time 8   Period Weeks   Status On-going   PT LONG TERM GOAL #4   Title The patient will negotiate community surfaces (including grass, curbs, inclines) with least restrictive assistive device modified indep.   Baseline Met per patient reports on 12/26/2014 without a device walking dog in the dark.   Time 8   Period Weeks   Status Achieved   PT LONG TERM GOAL #5   Title The patient will verbalize understanding of return to community exercise program for post d/c activities.   Baseline Target date 01/15/2015   Time 8   Period Weeks   Status On-going               Plan - 12/26/14 1343    Clinical Impression Statement The patient met STG for Berg and gait speed.  She is continuing to demonstrate progress by increasing her daily activities.  She has improved somewhat on sensory organization testing, however multi-sensory conditions challenge balance and patient utilizes UEs to catch herself.   PT Next Visit Plan check HEP, progress gaze, balance master training with multi-sensory environments.   Consulted and Agree with Plan of Care Patient        Problem List Patient Active Problem List   Diagnosis Date Noted  . 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 Dizziness and giddiness  04/29/2014  . 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 Hearing loss sensory, bilateral 06/25/2012  . 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

## 2015-01-02 ENCOUNTER — Encounter: Payer: 59 | Admitting: Rehabilitative and Restorative Service Providers"

## 2015-01-05 ENCOUNTER — Encounter: Payer: 59 | Admitting: Rehabilitative and Restorative Service Providers"

## 2015-01-16 ENCOUNTER — Ambulatory Visit: Payer: 59 | Attending: Otolaryngology | Admitting: Rehabilitative and Restorative Service Providers"

## 2015-01-16 DIAGNOSIS — H832X3 Labyrinthine dysfunction, bilateral: Secondary | ICD-10-CM | POA: Insufficient documentation

## 2015-01-16 DIAGNOSIS — R269 Unspecified abnormalities of gait and mobility: Secondary | ICD-10-CM | POA: Diagnosis not present

## 2015-01-16 NOTE — Patient Instructions (Signed)
Gaze Stabilization: Tip Card 1.Target must remain in focus, not blurry, and appear stationary while head is in motion. 2.Perform exercises with small head movements (45 to either side of midline). 3.Increase speed of head motion so long as target is in focus. 4.If you wear eyeglasses, be sure you can see target through lens (therapist will give specific instructions for bifocal / progressive lenses). 5.These exercises may provoke dizziness or nausea. Work through these symptoms. If too dizzy, slow head movement slightly. Rest between each exercise. 6.Exercises demand concentration; avoid distractions. 7.For safety, perform standing exercises close to a counter, wall, corner, or next to someone.  Copyright  VHI. All rights reserved.  Gaze Stabilization: Standing Feet Apart   Feet shoulder width apart, keeping eyes on target on wall 3 feet away, tilt head down slightly and move head side to side for 30 seconds. Repeat while moving head up and down for 30 seconds. Do 2 sessions per day.   Copyright  VHI. All rights reserved.   Gaze Stabilization: Tip Card 1.Target must remain in focus, not blurry, and appear stationary while head is in motion. 2.Perform exercises with small head movements (45 to either side of midline). 3.Increase speed of head motion so long as target is in focus. 4.If you wear eyeglasses, be sure you can see target through lens (therapist will give specific instructions for bifocal / progressive lenses). 5.These exercises may provoke dizziness or nausea. Work through these symptoms. If too dizzy, slow head movement slightly. Rest between each exercise. 6.Exercises demand concentration; avoid distractions. 7.For safety, perform standing exercises close to a counter, wall, corner, or next to someone.  Gaze Stabilization: Standing Feet Apart   Feet shoulder width apart, keeping eyes on target on wall 3 feet away, tilt head down slightly and move head side to side for  45 seconds. Repeat while moving head up and down for 45 seconds. * Increase how far you're turning your head.  Do 2 sessions per day.   Feet Apart (Compliant Surface) Head Motion - Eyes Closed    Stand with back to corner with stable chair in front of you. Stand on 2 pillows with feet shoulder width apart. Close eyes and move head slowly, up and down; right to left; and diagonally (right/up to left/down as well as left/up to right/down) 10 times each. Repeat 2 times per session. Do _1-2___ sessions per day.   Feet Together (Compliant Surface) Varied Arm Positions - Eyes Closed    Stand with back to corner with stable chair in front of you. Stand on 2 pillows with feet together and arms by your side. Close eyes and visualize upright position. Hold_30__ seconds. Repeat __4__ times per session. Do _1-2__ sessions per day.  Walking Figure Eight    Practice walking in figure eight. Start with large figures. Make it more difficult by walking in a smaller figure eight. Repeat _5___ times. Do _1-2___ sessions per day.  http://gt2.exer.us/538   Copyright  VHI. All rights reserved.   OTHER REMINDERS: 1) Use thicker foam for pillow standing exercises (from couch/sofa cushion) 2) Continue to progress gaze (letter) exercise from 45 seconds to a minute as able.  Visuo-Vestibular: Head / Eyes Moving in Opposite Direction    Holding a target, keep eyes on target and slowly move target side to side while moving head in OPPOSITE direction of target for _3-5___ seconds. Perform sitting. Repeat __10-20__ times per session. Do _2___ sessions per day.  Copyright  VHI. All rights reserved.

## 2015-01-16 NOTE — Therapy (Signed)
[facility] [address] [place], [place], [address] Phone: [phone]   Fax:  [phone]  Physical Therapy Treatment  Patient Details  Name: Denise Rice MRN: [id number] Date of Birth: 22-Feb-1950 Referring Provider: Oletta Cohn, MD  Encounter Date: 01/16/2015      PT End of Session - 01/16/15 0807    Visit Number 7   Number of Visits 16   Date for PT Re-Evaluation 01/15/15   Authorization Type private insurance $20 copay   PT Start Time 0804   PT Stop Time 0844   PT Time Calculation (min) 40 min   Equipment Utilized During Treatment Gait belt   Activity Tolerance Patient tolerated treatment well   Behavior During Therapy [facility] for tasks assessed/performed      Past Medical History  Diagnosis Date  . Anal condyloma   . History of peptic ulcer   . Otosclerosis of left ear   . Hearing loss, sensorineural, high frequency     RIGHT EAR  . 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 PCO (polycystic ovaries)   . Wears hearing aid     RIGHT EAR  . Deafness in left ear   . PONV (postoperative nausea and vomiting)     Past Surgical History  Procedure Laterality Date  . Vaginal hysterectomy  2007    fibroids  . Roux-en-y gastric bypass  AUG 2013  . Laparoscopic gastric banding  02-08-2007  . Strabismus surgery Right   . Laparoscopic cholecystectomy  03-31-2005  . Implantation bone anchored hearing aid Left 07-27-2008    left temporal bone  . Stapedes surgery Left 1985;  1994 x2;  02-03-1998  . Blepharoplasty  03-05-2000  . Laser ablation condolamata N/A 07/30/2012    Procedure: LASER ABLATION CONDOLAMATA;  Surgeon: Leighton Ruff, MD;  Location: [facility];  Service: General;  Laterality: N/A;  . Hammer toe surgery Left 08-29-2013    2nd toe  . Lasik Bilateral 2002    There were no vitals filed for this visit.  Visit Diagnosis:  Abnormality of gait  Vestibular disequilibrium,  bilateral      Subjective Assessment - 01/16/15 0806    Subjective The patient reports feeling like she had a mild set back last week.  She was out of work and more sedentary and noted imbalance worse in the morning when first rising.  She describes a sensation of being pulled down.   Patient Stated Goals Decrease dependence on the quad cane, return to prior exercise program, improve balance, reduce fatigue.   Currently in Pain? No/denies      NEUROMUSCULAR RE-EDUCATION: VOR x 1 viewing standing with feet apart and feet partial heel<>toe VOR x 2 viewing seated with emphasis on slow, deliberate/predictable head motion with gaze fixation  Standing on compliant surfaces including rocker board and foam performing wall bumps, standing with head turns, standing with eyes closed, imaginary targets with head turns requiring min to mod A with chair anterior due to losses of balance.  Gait with horizontal and vertical head turns with SBA for safety.  Reviewed prior HEP.        PT Education - 01/16/15 1204    Education provided Yes   Education Details HEP: reviewed and added gaze x 2 viewing in seated position.          PT Short Term Goals - 12/26/14 1342    PT SHORT TERM GOAL #1   Title The patient will be  independent with HEP for gaze adaptation, visual compensatory strategies, and balance tasks.   Baseline Met on 12/12/2014   Time 4   Period Weeks   Status Achieved   PT SHORT TERM GOAL #2   Title The patient will improve Berg score from 38/56 to > or equal to 45/56 to demo decreased risk for falls.   Baseline Pt scores 53/56 on 12/26/2014   Time 4   Period Weeks   Status Achieved   PT SHORT TERM GOAL #3   Title The patient will improve gait speed from 1.94 ft/sec to > or equal to 2.4 ft/sec to demo improving functional mobility.   Baseline imrpoved to 4.0 ft/sec on 12/26/2014   Time 4   Period Weeks   Status Achieved   PT SHORT TERM GOAL #4   Title The patient will improve  sensory organization testing from 40% to > or equal to 52% to demo improving sensory use for balance.   Baseline Pt improved to 51% on sensory organization testing   Time 4   Period Weeks   Status Partially Met           PT Long Term Goals - 01/16/15 3570    PT LONG TERM GOAL #1   Title The patient will report reduction in dizziness handicap index from 76% to < or equal to 50% to demonstrate improved self perception of dizziness and function.   Baseline Modified target date 02/14/2015   Time 8   Period Weeks   Status On-going   PT LONG TERM GOAL #2   Title The patient will improve gait speed to > or equal to 2.8 ft/sec indicating full community ambulator classification of gait.   Baseline 4.0 ft/sec on 12/26/2014   Time 8   Period Weeks   Status Achieved   PT LONG TERM GOAL #3   Title The patient will improve sensory organization testing from 40% to > or equal to 60% for improved compensation for vestibular loss.   Baseline Modiified target date 02/14/2015   Time 8   Period Weeks   Status On-going   PT LONG TERM GOAL #4   Title The patient will negotiate community surfaces (including grass, curbs, inclines) with least restrictive assistive device modified indep.   Baseline Met per patient reports on 12/26/2014 without a device walking dog in the dark.   Time 8   Period Weeks   Status Achieved   PT LONG TERM GOAL #5   Title The patient will verbalize understanding of return to community exercise program for post d/c activities.   Baseline Modified target date 02/14/2015   Time 8   Period Weeks   Status On-going               Plan - 01/16/15 1207    Clinical Impression Statement The patient has been seen at lower frequency than originally planned due to her work schedule and faster than anticipated progress with PT.  PT to continue working towards modified LTGs.  The patient continues with decreased VOR, decreased midline orientation on compliant surfaces and visual  reliance for balance.  PT to continue progressing HEP and community activities to promote optimal functional status.   PT Next Visit Plan Dynamic gait, VOR x 2, head turns on compliant surfaces, imaginary targets.   Consulted and Agree with Plan of Care Patient        Problem List Patient Active Problem List   Diagnosis Date Noted  . Superior semicircular canal  dehiscence of left ear 05/19/2014  . Dizziness and giddiness 04/29/2014  . 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 Hearing loss sensory, bilateral 06/25/2012  . 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

## 2015-01-29 MED FILL — ESCITALOPRAM 20 MG TABLET: 20 | 90 days supply | Qty: 90 | Fill #1

## 2015-01-31 ENCOUNTER — Encounter: Payer: 59 | Admitting: Rehabilitative and Restorative Service Providers"

## 2015-02-01 MED FILL — ZOLPIDEM TARTRATE 10 MG TAB: 10 | 90 days supply | Qty: 90 | Fill #0

## 2015-02-06 MED FILL — ALPRAZolam 1 MG TABS: 1 | 30 days supply | Qty: 90 | Fill #1

## 2015-02-14 ENCOUNTER — Ambulatory Visit: Payer: 59 | Admitting: Rehabilitative and Restorative Service Providers"

## 2015-02-20 ENCOUNTER — Encounter: Payer: Self-pay | Admitting: Rehabilitative and Restorative Service Providers"

## 2015-02-23 ENCOUNTER — Encounter: Payer: Self-pay | Admitting: Rehabilitative and Restorative Service Providers"

## 2015-02-23 NOTE — Therapy (Signed)
[facility] [address] [place], [place], [address] Phone: [phone]   Fax:  [phone]  Patient Details  Name: Denise Rice MRN: [id number] Date of Birth: 11-19-1950 Referring Provider:  No ref. provider found  Encounter Date: 01/16/2015  PHYSICAL THERAPY DISCHARGE SUMMARY  Visits from Start of Care: 7  Current functional level related to goals / functional outcomes:     PT Short Term Goals - 12/26/14 1342    PT SHORT TERM GOAL #1   Title The patient will be independent with HEP for gaze adaptation, visual compensatory strategies, and balance tasks.   Baseline Met on 12/12/2014   Time 4   Period Weeks   Status Achieved   PT SHORT TERM GOAL #2   Title The patient will improve Berg score from 38/56 to > or equal to 45/56 to demo decreased risk for falls.   Baseline Pt scores 53/56 on 12/26/2014   Time 4   Period Weeks   Status Achieved   PT SHORT TERM GOAL #3   Title The patient will improve gait speed from 1.94 ft/sec to > or equal to 2.4 ft/sec to demo improving functional mobility.   Baseline imrpoved to 4.0 ft/sec on 12/26/2014   Time 4   Period Weeks   Status Achieved   PT SHORT TERM GOAL #4   Title The patient will improve sensory organization testing from 40% to > or equal to 52% to demo improving sensory use for balance.   Baseline Pt improved to 51% on sensory organization testing   Time 4   Period Weeks   Status Partially Met         PT Long Term Goals - 01/16/15 2202    PT LONG TERM GOAL #1   Title The patient will report reduction in dizziness handicap index from 76% to < or equal to 50% to demonstrate improved self perception of dizziness and function.   Baseline Modified target date 02/14/2015   Time 8   Period Weeks   Status On-going   PT LONG TERM GOAL #2   Title The patient will improve gait speed to > or equal to 2.8 ft/sec indicating full community ambulator classification of gait.    Baseline 4.0 ft/sec on 12/26/2014   Time 8   Period Weeks   Status Achieved   PT LONG TERM GOAL #3   Title The patient will improve sensory organization testing from 40% to > or equal to 60% for improved compensation for vestibular loss.   Baseline Modiified target date 02/14/2015   Time 8   Period Weeks   Status On-going   PT LONG TERM GOAL #4   Title The patient will negotiate community surfaces (including grass, curbs, inclines) with least restrictive assistive device modified indep.   Baseline Met per patient reports on 12/26/2014 without a device walking dog in the dark.   Time 8   Period Weeks   Status Achieved   PT LONG TERM GOAL #5   Title The patient will verbalize understanding of return to community exercise program for post d/c activities.   Baseline Modified target date 02/14/2015   Time 8   Period Weeks   Status On-going     *The patient did not return for remaining goals to be assessed.  She cancelled due to upcoming travel and scheduling limitations reporting she feels she can continue working on balance through [employer].   Remaining deficits: Decreased multi-sensory balance (rocker board +  eyes closed to mimic compliant surfaces at night) Decreased single limb stance Decreased dynamic gait Decreased VOR   Education / Equipment: HEP, home safety, fall prevention, continuation of HEP after d/c and community wellness plan.  Plan: Patient agrees to discharge.  Patient goals were partially met. Patient is being discharged due to not returning since the last visit.  ?????       Thank you for the referral of this patient. Rudell Cobb, MPT   Barney Gertsch 02/23/2015, 9:55 AM  [facility] [address] [place], [place], [address] Phone: [phone]   Fax:  [phone]

## 2015-02-26 ENCOUNTER — Telehealth: Payer: 59 | Admitting: Family

## 2015-02-26 DIAGNOSIS — J019 Acute sinusitis, unspecified: Secondary | ICD-10-CM | POA: Diagnosis not present

## 2015-02-26 MED ORDER — AMOXICILLIN-POT CLAVULANATE 875-125 MG PO TABS: 1.0000 | ORAL_TABLET | Freq: Two times a day (BID) | ORAL | Status: DC

## 2015-02-26 MED FILL — AMOX-CLAV 875-125 MG TABLET: 875-125 | 7 days supply | Qty: 14 | Fill #0

## 2015-02-26 NOTE — Progress Notes (Signed)

## 2015-02-28 ENCOUNTER — Encounter: Payer: 59 | Admitting: Rehabilitative and Restorative Service Providers"

## 2015-03-05 MED FILL — ALPRAZolam 1 MG TABS: 1 | 30 days supply | Qty: 90 | Fill #2

## 2015-03-14 ENCOUNTER — Encounter: Payer: 59 | Admitting: Rehabilitative and Restorative Service Providers"

## 2015-03-30 MED FILL — ALPRAZolam 1 MG TABS: 1 | 30 days supply | Qty: 90 | Fill #3

## 2015-04-29 MED FILL — ALPRAZolam 1 MG TABS: 1 | 30 days supply | Qty: 90 | Fill #4

## 2015-04-29 MED FILL — ZOLPIDEM TARTRATE 10 MG TAB: 10 | 90 days supply | Qty: 90 | Fill #1

## 2015-05-16 ENCOUNTER — Other Ambulatory Visit: Payer: Self-pay | Admitting: Neurology

## 2015-05-16 ENCOUNTER — Encounter: Payer: Self-pay | Admitting: Neurology

## 2015-05-16 MED ORDER — TOPIRAMATE 25 MG PO TABS: 25.0000 mg | ORAL_TABLET | Freq: Three times a day (TID) | ORAL | Status: DC | PRN

## 2015-05-17 MED FILL — TOPIRAMATE 25 MG TABLET: 25 | 30 days supply | Qty: 90 | Fill #0

## 2015-05-25 DIAGNOSIS — G4700 Insomnia, unspecified: Secondary | ICD-10-CM | POA: Diagnosis not present

## 2015-05-25 DIAGNOSIS — F331 Major depressive disorder, recurrent, moderate: Secondary | ICD-10-CM | POA: Diagnosis not present

## 2015-05-25 DIAGNOSIS — F411 Generalized anxiety disorder: Secondary | ICD-10-CM | POA: Diagnosis not present

## 2015-05-25 MED FILL — ESCITALOPRAM 20 MG TABLET: 20 | 90 days supply | Qty: 90 | Fill #0

## 2015-05-28 MED FILL — ALPRAZolam 1 MG TABS: 1 | 30 days supply | Qty: 90 | Fill #0

## 2015-06-15 ENCOUNTER — Encounter (HOSPITAL_COMMUNITY): Payer: Self-pay | Admitting: Emergency Medicine

## 2015-06-15 ENCOUNTER — Ambulatory Visit (HOSPITAL_COMMUNITY)
Admission: EM | Admit: 2015-06-15 | Discharge: 2015-06-15 | Disposition: A | Discharge status: Home / Self Care | Payer: 59 | Attending: Emergency Medicine | Admitting: Emergency Medicine

## 2015-06-15 DIAGNOSIS — T162XXA Foreign body in left ear, initial encounter: Secondary | ICD-10-CM | POA: Diagnosis not present

## 2015-06-15 NOTE — ED Provider Notes (Signed)
CSN: [id number]     Arrival date & time 06/15/15  1727 History   First MD Initiated Contact with Patient 06/15/15 1805     Chief Complaint  Patient presents with  . Foreign Body in [place]   (Consider location/radiation/quality/duration/timing/severity/associated sxs/prior Treatment) HPI Comments: 65 year old female is complaining of left ear pain at the time that she realized that a part of her hearing aid was stuck in her left ear canal. This occurred approximate hour and a half ago. She states her husband tried to get it out but was unsuccessful. She describes this fragment as a soft material within indwelling Bluetooth electronic device.   Past Medical History  Diagnosis Date  . Anal condyloma   . History of peptic ulcer   . Otosclerosis of left ear   . Hearing loss, sensorineural, high frequency     RIGHT EAR  . 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 PCO (polycystic ovaries)   . Wears hearing aid     RIGHT EAR  . Deafness in left ear   . PONV (postoperative nausea and vomiting)    Past Surgical History  Procedure Laterality Date  . Vaginal hysterectomy  2007    fibroids  . Roux-en-y gastric bypass  AUG 2013  . Laparoscopic gastric banding  02-08-2007  . Strabismus surgery Right   . Laparoscopic cholecystectomy  03-31-2005  . Implantation bone anchored hearing aid Left 07-27-2008    left temporal bone  . Stapedes surgery Left 1985;  1994 x2;  02-03-1998  . Blepharoplasty  03-05-2000  . Laser ablation condolamata N/A 07/30/2012    Procedure: LASER ABLATION CONDOLAMATA;  Surgeon: Leighton Ruff, MD;  Location[facility];  Service: General;  Laterality: N/A;  . Hammer toe surgery Left 08-29-2013    2nd toe  . Lasik Bilateral 2002   Family History  Problem Relation Age of Onset  . Heart attack Mother   . Heart disease Mother   . Cancer Father     unknown primary  . Diabetes Maternal Aunt   . Heart disease Maternal Aunt   . Cancer Paternal  Aunt   . Cancer Paternal Uncle   . Stroke Sister    Social History  Substance Use Topics  . Smoking status: Never Smoker   . Smokeless tobacco: Never Used  . Alcohol Use: 0.0 oz/week    0 Standard drinks or equivalent per week     Comment: 1 glass wine/6 months   OB History    Gravida Para Term Preterm AB TAB SAB Ectopic Multiple Living   0              Review of Systems  Constitutional: Negative.   HENT: Positive for ear pain and hearing loss. Negative for congestion, facial swelling, postnasal drip and sore throat.   Eyes: Negative.   Respiratory: Negative.   Gastrointestinal: Negative.   All other systems reviewed and are negative.   Allergies  Review of patient's allergies indicates no known allergies.  Home Medications   Prior to Admission medications   Medication Sig Start Date End Date Taking? Authorizing Provider  ALPRAZolam (XANAX) 1 MG tablet TAKE 1 TABLET BY MOUTH 3 TIMES DAILY AS NEEDED FOR ANXIETY 08/01/14  Yes Melvenia Beam, MD  topiramate (TOPAMAX) 25 MG tablet Take 1 tablet (25 mg total) by mouth 3 (three) times daily as needed. 05/16/15  Yes Melvenia Beam, MD  zolpidem (AMBIEN) 10 MG tablet TAKE 1 TABLET BY MOUTH  AT BEDTIME AS NEEDED 11/09/14  Yes Megan Salon, MD  amoxicillin-clavulanate (AUGMENTIN) 875-125 MG tablet Take 1 tablet by mouth 2 (two) times daily. 02/26/15   Sharion Balloon, FNP  Biotin 5000 MCG CAPS Take by mouth daily.    Historical Provider, MD  Calcium Carbonate-Vitamin D (CALCIUM 600 + D PO) Take 1 tablet by mouth daily.     Historical Provider, MD  cyanocobalamin 2000 MCG tablet Take 2,000 mcg by mouth daily.    Historical Provider, MD  escitalopram (LEXAPRO) 10 MG tablet Take 1 tablet (10 mg total) by mouth daily. Patient taking differently: Take 20 mg by mouth daily.  10/04/13   Elveria Rising, MD  ferrous sulfate 325 (65 FE) MG tablet Take 325 mg by mouth daily with breakfast.    Historical Provider, MD  ibuprofen (ADVIL,MOTRIN) 600 MG  tablet Take 1 tablet (600 mg total) by mouth every 6 (six) hours as needed. 06/22/14   Varney Biles, MD  Multiple Vitamins-Minerals (MULTIVITAMIN ADULT PO) Take 1 tablet by mouth.    Historical Provider, MD   Meds Ordered and Administered this Visit  Medications - No data to display  BP 136/78 mmHg  Pulse 66  Temp(Src) 97.6 F (36.4 C) (Oral)  Resp 16  SpO2 100%  LMP 01/13/2005 No data found.   Physical Exam  Constitutional: She is oriented to person, place, and time. She appears well-developed and well-nourished. No distress.  HENT:  Head: Normocephalic and atraumatic.  Right Ear: External ear normal.  Mouth/Throat: Oropharynx is clear and moist.  There is a blue colored foreign body in the left EAC. No drainage, bleeding or erythema to the soft tissue.  Eyes: EOM are normal.  Neck: Normal range of motion. Neck supple.  Pulmonary/Chest: Effort normal.  Neurological: She is alert and oriented to person, place, and time. She exhibits normal muscle tone.  Skin: Skin is warm and dry.  Nursing note and vitals reviewed.   ED Course  .Foreign Body Removal Date/Time: 06/15/2015 6:58 PM Performed by: Marcha Dutton, Janice Seales Authorized by: Melony Overly Consent: Verbal consent obtained. Risks and benefits: risks, benefits and alternatives were discussed Consent given by: patient Patient understanding: patient states understanding of the procedure being performed Patient identity confirmed: verbally with patient Body area: ear Location details: left ear Patient sedated: no Patient restrained: no Patient cooperative: yes Localization method: visualized Removal mechanism: alligator forceps Complexity: simple 1 objects recovered. Objects recovered: [place]arp[place] fragment of hearing aid Post-procedure assessment: foreign body removed Patient tolerance: Patient tolerated the procedure well with no immediate complications   (including critical care time)  Labs Review Labs Reviewed - No data to  display  Imaging Review No results found.   Visual Acuity Review  Right Eye Distance:   Left Eye Distance:   Bilateral Distance:    Right Eye Near:   Left Eye Near:    Bilateral Near:         MDM   1. Acute foreign body of ear canal, left, initial encounter    Foreign body successfully removed with alligator forceps. No EAC erythema, swelling, drainage, bleeding or other signs of trauma or infection. 4 any residual ear pain may take Tylenol or ibuprofen.    Janne Napoleon, NP 06/15/15 1900

## 2015-06-15 NOTE — ED Notes (Signed)
Pt d/c by Janne Napoleon, np

## 2015-06-15 NOTE — Discharge Instructions (Signed)
Ear Foreign Body °An ear foreign body is an object that is stuck in your ear. The object is usually stuck in the ear canal. °CAUSES °In all ages of people, the most common foreign bodies are insects that enter the ear canal. It is common for young children to put objects into the ear canal. These may include pebbles, beads, parts of toys, and any other small objects that fit into the ear. In adults, objects such as cotton swabs may become lodged in the ear canal.  °SIGNS AND SYMPTOMS °A foreign body in the ear may cause: °· Pain. °· Buzzing or roaring sounds. °· Hearing loss. °· Ear drainage or bleeding. °· Nausea and vomiting. °· A feeling that your ear is full. °DIAGNOSIS °Your health care provider may be able to diagnose an ear foreign body based on the information that you provide, your symptoms, and a physical exam. Your health care provider may also perform tests, such as testing your hearing and your ear pressure, to check for infection or other problems that are caused by the foreign body in your ear. °TREATMENT °Treatment depends on what the foreign body is, the location of the foreign body in your ear, and whether or not the foreign body has injured any part of your inner ear. If the foreign body is visible to your health care provider, it may be possible to remove the foreign body using: °· A tool, such as medical tweezers (forceps) or a suction tube (catheter). °· Irrigation. This uses water to flush the foreign body out of your ear. This is used only if the foreign body is not likely to swell or enlarge when it is put in water. °If the foreign body is not visible or your health care provider was not able to remove the foreign body, you may be referred to a specialist for removal. You may also be prescribed antibiotic medicine or ear drops to prevent infection. If the foreign body has caused injury to other parts of your ear, you may need additional treatment. °HOME CARE INSTRUCTIONS °· Keep all  follow-up visits as directed by your health care provider. This is important. °· Take medicines only as directed by your health care provider. °· If you were prescribed an antibiotic medicine, finish it all even if you start to feel better. °PREVENTION °· Keep small objects out of reach of young children. Tell children not to put anything in their ears. °· Do not put anything in your ear, including cotton swabs, to clean your ears. Talk to your health care provider about how to clean your ears safely. °SEEK MEDICAL CARE IF: °· You have a headache. °· Your have blood coming from your ear. °· You have a fever. °· You have increased pain or swelling of your ear. °· Your hearing is reduced. °· You have discharge coming from your ear. °  °This information is not intended to replace advice given to you by your health care provider. Make sure you discuss any questions you have with your health care provider. °  °Document Released: 12/28/1999 Document Revised: 01/20/2014 Document Reviewed: 08/15/2013 °Elsevier Interactive Patient Education ©2016 [employer] ° °

## 2015-06-15 NOTE — ED Notes (Signed)
Pt reports part of her hearing aid is lodged in her left ear onset x1 hour ago associated w/pain.... A&O x4... No acute distress.

## 2015-06-26 MED FILL — TOPIRAMATE 25 MG TABLET: 25 | 30 days supply | Qty: 90 | Fill #1

## 2015-06-26 MED FILL — ALPRAZolam 1 MG TABS: 1 | 30 days supply | Qty: 90 | Fill #1

## 2015-07-04 MED FILL — RESTASIS 0.05% EYE EMULSION: 0.05 | 90 days supply | Qty: 180 | Fill #0

## 2015-07-24 MED FILL — TOPIRAMATE 25 MG TABLET: 25 | 30 days supply | Qty: 90 | Fill #2

## 2015-07-24 MED FILL — ALPRAZolam 1 MG TABS: 1 | 30 days supply | Qty: 90 | Fill #2

## 2015-07-26 MED FILL — ZOLPIDEM TARTRATE 10 MG TAB: 10 | 30 days supply | Qty: 30 | Fill #0

## 2015-07-30 DIAGNOSIS — Z0000 Encounter for general adult medical examination without abnormal findings: Secondary | ICD-10-CM | POA: Diagnosis not present

## 2015-07-30 DIAGNOSIS — E559 Vitamin D deficiency, unspecified: Secondary | ICD-10-CM | POA: Diagnosis not present

## 2015-08-01 DIAGNOSIS — Z23 Encounter for immunization: Secondary | ICD-10-CM | POA: Diagnosis not present

## 2015-08-01 DIAGNOSIS — Z1211 Encounter for screening for malignant neoplasm of colon: Secondary | ICD-10-CM | POA: Diagnosis not present

## 2015-08-01 DIAGNOSIS — Z0000 Encounter for general adult medical examination without abnormal findings: Secondary | ICD-10-CM | POA: Diagnosis not present

## 2015-08-03 ENCOUNTER — Encounter: Payer: Self-pay | Admitting: Gastroenterology

## 2015-08-08 DIAGNOSIS — H10412 Chronic giant papillary conjunctivitis, left eye: Secondary | ICD-10-CM | POA: Diagnosis not present

## 2015-08-23 MED FILL — ALPRAZolam 1 MG TABS: 1 | 30 days supply | Qty: 90 | Fill #3

## 2015-08-23 MED FILL — ZOLPIDEM TARTRATE 10 MG TAB: 10 | 30 days supply | Qty: 30 | Fill #1

## 2015-08-31 ENCOUNTER — Telehealth: Payer: Self-pay | Admitting: *Deleted

## 2015-08-31 NOTE — Telephone Encounter (Signed)
Patient checking to see date of last pap. Per office note from 2015 annual. Last pap done in 2007. Patient has had hysterectomy. Annual exam scheduled for 09-07-15 with Dr Sabra Heck.  Routing to provider for final review. Patient agreeable to disposition. Will close encounter.

## 2015-09-07 ENCOUNTER — Ambulatory Visit (INDEPENDENT_AMBULATORY_CARE_PROVIDER_SITE_OTHER): Payer: 59 | Admitting: Obstetrics & Gynecology

## 2015-09-07 ENCOUNTER — Other Ambulatory Visit: Payer: Self-pay | Admitting: Obstetrics & Gynecology

## 2015-09-07 ENCOUNTER — Encounter: Payer: Self-pay | Admitting: Obstetrics & Gynecology

## 2015-09-07 VITALS — BP 128/74 | HR 72 | Resp 18 | Ht 61.5 in | Wt 158.0 lb

## 2015-09-07 DIAGNOSIS — Z205 Contact with and (suspected) exposure to viral hepatitis: Secondary | ICD-10-CM

## 2015-09-07 DIAGNOSIS — Z0000 Encounter for general adult medical examination without abnormal findings: Secondary | ICD-10-CM | POA: Diagnosis not present

## 2015-09-07 DIAGNOSIS — G4700 Insomnia, unspecified: Secondary | ICD-10-CM

## 2015-09-07 DIAGNOSIS — Z01419 Encounter for gynecological examination (general) (routine) without abnormal findings: Secondary | ICD-10-CM | POA: Diagnosis not present

## 2015-09-07 LAB — COMPREHENSIVE METABOLIC PANEL
ALT: 18 U/L (ref 6–29)
AST: 25 U/L (ref 10–35)
Albumin: 3.5 g/dL — ABNORMAL LOW (ref 3.6–5.1)
Alkaline Phosphatase: 63 U/L (ref 33–130)
BILIRUBIN TOTAL: 0.5 mg/dL (ref 0.2–1.2)
BUN: 13 mg/dL (ref 7–25)
CHLORIDE: 106 mmol/L (ref 98–110)
CO2: 28 mmol/L (ref 20–31)
CREATININE: 0.64 mg/dL (ref 0.50–0.99)
Calcium: 8.6 mg/dL (ref 8.6–10.4)
Glucose, Bld: 86 mg/dL (ref 65–99)
Potassium: 4 mmol/L (ref 3.5–5.3)
SODIUM: 144 mmol/L (ref 135–146)
TOTAL PROTEIN: 6 g/dL — AB (ref 6.1–8.1)

## 2015-09-07 LAB — POCT URINALYSIS DIPSTICK
Bilirubin, UA: NEGATIVE
Glucose, UA: NEGATIVE
Ketones, UA: NEGATIVE
Nitrite, UA: NEGATIVE
Protein, UA: NEGATIVE
Urobilinogen, UA: NEGATIVE
pH, UA: 7

## 2015-09-07 LAB — HEPATIC FUNCTION PANEL
ALT: 18 U/L (ref 6–29)
AST: 25 U/L (ref 10–35)
Albumin: 3.5 g/dL — ABNORMAL LOW (ref 3.6–5.1)
Alkaline Phosphatase: 63 U/L (ref 33–130)
Bilirubin, Direct: 0.2 mg/dL
Indirect Bilirubin: 0.3 mg/dL (ref 0.2–1.2)
Total Bilirubin: 0.5 mg/dL (ref 0.2–1.2)
Total Protein: 6 g/dL — ABNORMAL LOW (ref 6.1–8.1)

## 2015-09-07 NOTE — Progress Notes (Signed)
65 y.o. G0P0 MarriedCaucasianF here for annual exam. Seeing Dr. Ernie Hew as PCP about two weeks.  Doing well.  No vaginal bleeding.      Has been one year since surgery.  Doing so much better.   Daughter just went to college.    Pt reports with Dr. Ernie Hew at recent visit, bilirubin was mildly elevated as well as BUN.  Would like recheck.  Hep C testing d/w pt today as well.  Pt comfortable with doing this as well.  Patient's last menstrual period was 01/13/2005.          Sexually active: Yes.    The current method of family planning is status post hysterectomy.    Exercising: Yes.    low impact Smoker:  no  Health Maintenance: Pap:  2007 - WNL History of abnormal Pap:  Yes 20 years ago-Dysplasia MMG:  03/13/14 BIRADS 1 negative Colonoscopy:  Per patient 2006 - scheduled 10/12/15 - [facility] BMD:   none TDaP:  UTD Pneumonia vaccine(s):  X 1 Zostavax:   completed Hep C testing: would like this done today; blood already drawn Screening Labs: PCP - patient would like BMP recheck due to abnormal BUN/Creatinine/Bilirubin?, Hb today: PCP, Urine today: not resulted   reports that she has never smoked. She has never used smokeless tobacco. She reports that she drinks alcohol. She reports that she does not use drugs.  Past Medical History:  Diagnosis Date  . Anal condyloma   . Deafness in left ear   . Hearing loss, sensorineural, high frequency    RIGHT EAR  . History of peptic ulcer   . Otosclerosis of left ear   . PCO (polycystic ovaries)   . PONV (postoperative nausea and vomiting)   . 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 Wears hearing aid    RIGHT EAR    Past Surgical History:  Procedure Laterality Date  . BLEPHAROPLASTY  03-05-2000  . HAMMER TOE SURGERY Left 08-29-2013   2nd toe  . IMPLANTATION BONE ANCHORED HEARING AID Left 07-27-2008   left temporal bone  . LAPAROSCOPIC CHOLECYSTECTOMY  03-31-2005  . LAPAROSCOPIC GASTRIC BANDING  02-08-2007  . LASER  ABLATION CONDOLAMATA N/A 07/30/2012   Procedure: LASER ABLATION CONDOLAMATA;  Surgeon: Leighton Ruff, MD;  Location: [facility];  Service: General;  Laterality: N/A;  . LASIK Bilateral 2002  . ROUX-EN-Y GASTRIC BYPASS  AUG 2013  . [place];  1994 x2;  02-03-1998  . STRABISMUS SURGERY Right   . VAGINAL HYSTERECTOMY  2007   fibroids    Current Outpatient Prescriptions  Medication Sig Dispense Refill  . ALPRAZolam (XANAX) 1 MG tablet TAKE 1 TABLET BY MOUTH 3 TIMES DAILY AS NEEDED FOR ANXIETY 90 tablet 4  . amoxicillin-clavulanate (AUGMENTIN) 875-125 MG tablet Take 1 tablet by mouth 2 (two) times daily. 14 tablet 0  . Biotin 5000 MCG CAPS Take by mouth daily.    . Calcium Carbonate-Vitamin D (CALCIUM 600 + D PO) Take 1 tablet by mouth daily.     . cyanocobalamin 2000 MCG tablet Take 2,000 mcg by mouth daily.    Marland Kitchen escitalopram (LEXAPRO) 10 MG tablet Take 1 tablet (10 mg total) by mouth daily. (Patient taking differently: Take 20 mg by mouth daily. ) 90 tablet 3  . ferrous sulfate 325 (65 FE) MG tablet Take 325 mg by mouth daily with breakfast.    . ibuprofen (ADVIL,MOTRIN) 600 MG tablet Take 1 tablet (600 mg total)  by mouth every 6 (six) hours as needed. 30 tablet 0  . Multiple Vitamins-Minerals (MULTIVITAMIN ADULT PO) Take 1 tablet by mouth.    . topiramate (TOPAMAX) 25 MG tablet Take 1 tablet (25 mg total) by mouth 3 (three) times daily as needed. 90 tablet 12  . zolpidem (AMBIEN) 10 MG tablet TAKE 1 TABLET BY MOUTH AT BEDTIME AS NEEDED 30 tablet 2   No current facility-administered medications for this visit.     Family History  Problem Relation Age of Onset  . Heart attack Mother   . Heart disease Mother   . Cancer Father     unknown primary  . Diabetes Maternal Aunt   . Heart disease Maternal Aunt   . Cancer Paternal Aunt   . Cancer Paternal Uncle   . Stroke Sister     ROS:  Pertinent items are noted in HPI.  Otherwise, a comprehensive ROS was  negative.  Exam:   Vitals:   09/07/15 0833  BP: 128/74  Pulse: 72  Resp: 18  Weight: 158 lb (71.7 kg)  Height: 5' 1.5" (1.562 m)   General appearance: alert, cooperative and appears stated age Head: Normocephalic, without obvious abnormality, atraumatic Neck: no adenopathy, supple, symmetrical, trachea midline and thyroid normal to inspection and palpation Lungs: clear to auscultation bilaterally Breasts: normal appearance, no masses or tenderness Heart: regular rate and rhythm Abdomen: soft, non-tender; bowel sounds normal; no masses,  no organomegaly Extremities: extremities normal, atraumatic, no cyanosis or edema Skin: Skin color, texture, turgor normal. No rashes or lesions Lymph nodes: Cervical, supraclavicular, and axillary nodes normal. No abnormal inguinal nodes palpated Neurologic: Grossly normal   Pelvic: External genitalia:  no lesions              Urethra:  normal appearing urethra with no masses, tenderness or lesions              Bartholins and Skenes: normal                 Vagina: normal appearing vagina with normal color and discharge, no lesions              Cervix: absent              Pap taken: No. Bimanual Exam:  Uterus:  uterus absent              Adnexa: no mass, fullness, tenderness               Rectovaginal: Confirms               Anus:  normal sphincter tone, no lesions  Chaperone was present for exam.  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  Hep C antibody obtained today. Labs/meds/vaccines all with Dr. Ernie Hew. return annually or prn

## 2015-09-08 ENCOUNTER — Encounter: Payer: Self-pay | Admitting: Obstetrics & Gynecology

## 2015-09-08 DIAGNOSIS — G4700 Insomnia, unspecified: Secondary | ICD-10-CM | POA: Insufficient documentation

## 2015-09-08 LAB — HEPATITIS C ANTIBODY: HCV AB: NEGATIVE

## 2015-09-10 LAB — HIV ANTIBODY (ROUTINE TESTING W REFLEX): HIV: NONREACTIVE

## 2015-09-14 ENCOUNTER — Encounter: Payer: Self-pay | Admitting: Obstetrics & Gynecology

## 2015-09-20 MED FILL — TOPIRAMATE 25 MG TABLET: 25 | 30 days supply | Qty: 90 | Fill #3

## 2015-09-20 MED FILL — ZOLPIDEM TARTRATE 10 MG TAB: 10 | 30 days supply | Qty: 30 | Fill #2

## 2015-09-20 MED FILL — ALPRAZolam 1 MG TABS: 1 | 30 days supply | Qty: 90 | Fill #4

## 2015-09-20 MED FILL — ESCITALOPRAM 20 MG TABLET: 20 | 90 days supply | Qty: 90 | Fill #1

## 2015-09-28 ENCOUNTER — Ambulatory Visit (AMBULATORY_SURGERY_CENTER): Payer: Self-pay

## 2015-09-28 VITALS — Ht 61.5 in | Wt 157.2 lb

## 2015-09-28 DIAGNOSIS — Z1211 Encounter for screening for malignant neoplasm of colon: Secondary | ICD-10-CM

## 2015-09-28 MED ORDER — SUPREP BOWEL PREP KIT 17.5-3.13-1.6 GM/177ML PO SOLN: 1.0000 | Freq: Once | ORAL | 0 refills | Status: AC

## 2015-09-28 MED FILL — SUPREP BOWEL PREP KIT: 17.5-3.13-1 | 2 days supply | Qty: 354 | Fill #0

## 2015-09-28 NOTE — Progress Notes (Signed)
No allergies to eggs or soy No past problems with anesthesia No diet meds No home oxygen  Declined emmi 

## 2015-10-03 DIAGNOSIS — Z23 Encounter for immunization: Secondary | ICD-10-CM | POA: Diagnosis not present

## 2015-10-12 ENCOUNTER — Encounter: Payer: Self-pay | Admitting: Gastroenterology

## 2015-10-12 ENCOUNTER — Ambulatory Visit (AMBULATORY_SURGERY_CENTER): Payer: 59 | Admitting: Gastroenterology

## 2015-10-12 VITALS — BP 115/76 | HR 61 | Temp 98.0°F | Resp 13 | Ht 61.5 in | Wt 157.0 lb

## 2015-10-12 DIAGNOSIS — Z1211 Encounter for screening for malignant neoplasm of colon: Secondary | ICD-10-CM | POA: Diagnosis present

## 2015-10-12 MED ORDER — SODIUM CHLORIDE 0.9 % IV SOLN: 500.0000 mL | INTRAVENOUS | Status: DC

## 2015-10-12 NOTE — Progress Notes (Signed)
To recovery, report to McCoy, RN, VSS 

## 2015-10-12 NOTE — Patient Instructions (Signed)
Discharge instructions given. Handouts on diverticulosis and hemorrhoids. Resume previous medications. YOU HAD AN ENDOSCOPIC PROCEDURE TODAY AT THE [facility]:   Refer to the procedure report that was given to you for any specific questions about what was found during the examination.  If the procedure report does not answer your questions, please call your gastroenterologist to clarify.  If you requested that your care partner not be given the details of your procedure findings, then the procedure report has been included in a sealed envelope for you to review at your convenience later.  YOU SHOULD EXPECT: Some feelings of bloating in the abdomen. Passage of more gas than usual.  Walking can help get rid of the air that was put into your GI tract during the procedure and reduce the bloating. If you had a lower endoscopy (such as a colonoscopy or flexible sigmoidoscopy) you may notice spotting of blood in your stool or on the toilet paper. If you underwent a bowel prep for your procedure, you may not have a normal bowel movement for a few days.  Please Note:  You might notice some irritation and congestion in your nose or some drainage.  This is from the oxygen used during your procedure.  There is no need for concern and it should clear up in a day or so.  SYMPTOMS TO REPORT IMMEDIATELY:   Following lower endoscopy (colonoscopy or flexible sigmoidoscopy):  Excessive amounts of blood in the stool  Significant tenderness or worsening of abdominal pains  Swelling of the abdomen that is new, acute  Fever of 100F or higher   For urgent or emergent issues, a gastroenterologist can be reached at any hour by calling [phone].   DIET:  We do recommend a small meal at first, but then you may proceed to your regular diet.  Drink plenty of fluids but you should avoid alcoholic beverages for 24 hours.  ACTIVITY:  You should plan to take it easy for the rest of today and you should  NOT DRIVE or use heavy machinery until tomorrow (because of the sedation medicines used during the test).    FOLLOW UP: Our staff will call the number listed on your records the next business day following your procedure to check on you and address any questions or concerns that you may have regarding the information given to you following your procedure. If we do not reach you, we will leave a message.  However, if you are feeling well and you are not experiencing any problems, there is no need to return our call.  We will assume that you have returned to your regular daily activities without incident.  If any biopsies were taken you will be contacted by phone or by letter within the next 1-3 weeks.  Please call us at [phone] if you have not heard about the biopsies in 3 weeks.    SIGNATURES/CONFIDENTIALITY: You and/or your care partner have signed paperwork which will be entered into your electronic medical record.  These signatures attest to the fact that that the information above on your After Visit Summary has been reviewed and is understood.  Full responsibility of the confidentiality of this discharge information lies with you and/or your care-partner. 

## 2015-10-12 NOTE — Progress Notes (Signed)
No egg or soy allergy known to patient  issues with past sedation with any surgeries  or procedures of post op N/V, no intubation problems  No diet pills per patient No home 02 use per patient  No blood thinners per patient  Pt denies issues with constipation  No A fib or A flutter

## 2015-10-12 NOTE — Op Note (Signed)
[facility] Patient Name: Denise Rice Procedure Date: 10/12/2015 8:34 AM MRN: [id number] Endoscopist: Hutchins. Loletha Carrow , MD Age: 65 Referring MD:  Date of Birth: [birth year] Gender: Female Account #: [id number] Procedure:                Colonoscopy Indications:              Screening for colorectal malignant neoplasm Medicines:                Monitored Anesthesia Care Procedure:                Pre-Anesthesia Assessment:                           - Prior to the procedure, a History and Physical                            was performed, and patient medications and                            allergies were reviewed. The patient's tolerance of                            previous anesthesia was also reviewed. The risks                            and benefits of the procedure and the sedation                            options and risks were discussed with the patient.                            All questions were answered, and informed consent                            was obtained. Prior Anticoagulants: The patient has                            taken no previous anticoagulant or antiplatelet                            agents. ASA Grade Assessment: II - A patient with                            mild systemic disease. After reviewing the risks                            and benefits, the patient was deemed in                            satisfactory condition to undergo the procedure.                           After obtaining informed consent, the colonoscope  was passed under direct vision. Throughout the                            procedure, the patient's blood pressure, pulse, and                            oxygen saturations were monitored continuously. The                            Model CF-HQ190L [id number]) scope was introduced                            through the anus and advanced to the the cecum,                            identified by  appendiceal orifice and ileocecal                            valve. The colonoscopy was performed without                            difficulty. The patient tolerated the procedure                            well. The quality of the bowel preparation was                            excellent. The ileocecal valve, appendiceal                            orifice, and rectum were photographed. The quality                            of the bowel preparation was evaluated using the                            BBPS [place] Bowel Preparation Scale) with scores                            of: Right Colon = 2, Transverse Colon = 3 and Left                            Colon = 3. The total BBPS score equals 8. The bowel                            preparation used was SUPREP. Scope In: 8:41:44 AM Scope Out: 8:54:23 AM Scope Withdrawal Time: 0 hours 9 minutes 41 seconds  Total Procedure Duration: 0 hours 12 minutes 39 seconds  Findings:                 The digital rectal exam findings include decreased                            sphincter  tone.                           A few small-mouthed diverticula were found in the                            right colon.                           Internal hemorrhoids were found during anoscopy.                            The hemorrhoids were small and Grade I (internal                            hemorrhoids that do not prolapse).                           The exam was otherwise without abnormality on                            direct and retroflexion views. Complications:            No immediate complications. Estimated Blood Loss:     Estimated blood loss: none. Impression:               - Decreased sphincter tone found on digital rectal                            exam.                           - Diverticulosis in the right colon.                           - Internal hemorrhoids.                           - The examination was otherwise normal on direct                             and retroflexion views.                           - No specimens collected. Recommendation:           - Patient has a contact number available for                            emergencies. The signs and symptoms of potential                            delayed complications were discussed with the                            patient. Return to normal activities tomorrow.  Written discharge instructions were provided to the                            patient.                           - Resume previous diet.                           - Continue present medications.                           - Repeat colonoscopy in 10 years for screening                            purposes. Sharlet Notaro L. Loletha Carrow, MD 10/12/2015 8:58:55 AM This report has been signed electronically.

## 2015-10-15 ENCOUNTER — Telehealth: Payer: Self-pay | Admitting: *Deleted

## 2015-10-15 NOTE — Telephone Encounter (Signed)
  Follow up Call-  Call back number 10/12/2015  Post procedure Call Back phone  # [phone]  Permission to leave phone message Yes  Some recent data might be hidden     Patient questions:  Do you have a fever, pain , or abdominal swelling? No. Pain Score  0 *  Have you tolerated food without any problems? Yes.    Have you been able to return to your normal activities? Yes.    Do you have any questions about your discharge instructions: Diet   No. Medications  No. Follow up visit  No.  Do you have questions or concerns about your Care? No.  Actions: * If pain score is 4 or above: No action needed, pain <4.

## 2015-10-18 MED FILL — ALPRAZolam 1 MG TABS: 1 | 30 days supply | Qty: 90 | Fill #5

## 2015-10-18 MED FILL — ZOLPIDEM TARTRATE 10 MG TAB: 10 | 30 days supply | Qty: 30 | Fill #3

## 2015-11-01 ENCOUNTER — Encounter (INDEPENDENT_AMBULATORY_CARE_PROVIDER_SITE_OTHER): Payer: Self-pay

## 2015-11-02 ENCOUNTER — Ambulatory Visit (INDEPENDENT_AMBULATORY_CARE_PROVIDER_SITE_OTHER): Payer: 59 | Admitting: Orthopaedic Surgery

## 2015-11-02 DIAGNOSIS — S8002XA Contusion of left knee, initial encounter: Secondary | ICD-10-CM | POA: Diagnosis not present

## 2015-11-02 DIAGNOSIS — M25562 Pain in left knee: Secondary | ICD-10-CM

## 2015-11-14 MED FILL — TOPIRAMATE 25 MG TABLET: 25 | 30 days supply | Qty: 90 | Fill #4

## 2015-11-15 MED FILL — ALPRAZolam 1 MG TABS: 1 | 30 days supply | Qty: 90 | Fill #0

## 2015-11-15 MED FILL — ZOLPIDEM TARTRATE 10 MG TAB: 10 | 30 days supply | Qty: 30 | Fill #4

## 2015-12-14 MED FILL — ALPRAZolam 1 MG TABS: 1 | 30 days supply | Qty: 90 | Fill #1

## 2015-12-14 MED FILL — ZOLPIDEM TARTRATE 10 MG TAB: 10 | 30 days supply | Qty: 30 | Fill #0

## 2016-01-11 MED FILL — ZOLPIDEM TARTRATE 10 MG TAB: 10 | 30 days supply | Qty: 30 | Fill #1

## 2016-01-11 MED FILL — ALPRAZolam 1 MG TABS: 1 | 30 days supply | Qty: 90 | Fill #2

## 2016-01-11 MED FILL — TOPIRAMATE 25 MG TABLET: 25 | 30 days supply | Qty: 90 | Fill #5

## 2016-01-22 ENCOUNTER — Encounter: Payer: Self-pay | Admitting: *Deleted

## 2016-01-22 ENCOUNTER — Telehealth: Payer: Self-pay | Admitting: Neurology

## 2016-01-22 ENCOUNTER — Ambulatory Visit (INDEPENDENT_AMBULATORY_CARE_PROVIDER_SITE_OTHER): Payer: 59 | Admitting: Adult Health

## 2016-01-22 VITALS — BP 116/70 | HR 63 | Wt 158.8 lb

## 2016-01-22 DIAGNOSIS — H838X9 Other specified diseases of inner ear, unspecified ear: Secondary | ICD-10-CM

## 2016-01-22 DIAGNOSIS — G43019 Migraine without aura, intractable, without status migrainosus: Secondary | ICD-10-CM

## 2016-01-22 DIAGNOSIS — R51 Headache: Secondary | ICD-10-CM

## 2016-01-22 DIAGNOSIS — R519 Headache, unspecified: Secondary | ICD-10-CM

## 2016-01-22 MED ORDER — GABAPENTIN 100 MG PO CAPS: ORAL_CAPSULE | ORAL | 5 refills | Status: DC

## 2016-01-22 MED FILL — GABAPENTIN 100 MG CAPSULE: 100 | 30 days supply | Qty: 60 | Fill #0

## 2016-01-22 NOTE — Progress Notes (Signed)
PATIENT: Denise Rice DOB: 1950-11-27  REASON FOR VISIT: follow up HISTORY FROM: patient  HISTORY OF PRESENT ILLNESS: Today 02/01/2016: Ms. 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 She returns today for follow-up. She states that approximately 3-4 months ago she developed headaches. She states that the headaches usually occur 2-3 times a week. Her headaches typically occur on the right side. She reports with some of her headache she notes scalp sensitivity. Especially with brushing her hair. She does report some nausea but no vomiting. She also has blurry vision with her headaches. She does have photophobia but denies phonophobia. She always gets dizziness with her headache. Denies any numbness or tingling in the arms or legs. She does have chronic issues with her balance due to her history. She returns today for an evaluation.  HISTORY 12/18/14: ALIJAH HYDE is a lovely 66 y.o. female here as a referral from Dr. 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 She had her surgery. After about 8 weeks her hearing improved and her vertigo improved. She is in rehab for her vertigo. She is hearing better. She tires very easily. No falls. She still has dizziness but nbithing significant.  HPI: KIYOMI PALLO is a lovely 66 y.o. female here as a referral from Dr. 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 She has had multiple falls, most recently in her kitchen. She does not remember falling. She was facing the fridge and truned around and next thing she remembers she was on the ground. She did not proceed to the ED. She had a split lip and there was blood everywhere. Her nose is hurting on the bridge but reports no further alteration of consciousness or confusion. She has been having headaches since hitting her head but they are improving. No  irritability, no decreased concentration, no increase in dizziness, no further memory changes or other post-concussive symptma. The headaches feel dull throbbing. She is better, hasn;t had one in a few days. This is the first time she has fallen and has no recollection of the fall. Discussed at length, it appears as though she may have hit her head pretty hard on the floor and possibly cause some memory loss. I did encourage her to go to the emergency room next time for evaluation of intracranial hematomas as this can be a serious complication of falls.   Patient is scheduled for repair of the semicircular canals. She has been told she may be dizzier afterwards but over the long term her surgeon is hopeful it will help. She had her first surgery in 1970. She is using a cane. She feels good today. Yesterday she also felt well. Walking makes her feel worse. The Xanax 1mg  twice a day helps a lot for the disequilibrium. The scopolamine patch helped but was too expensive. She will be admitted on July 22nd to [facility]. She takes [place] every night. Discussed that at this time it's important for her to concentrate on her surgery and get better. After her surgery and recovery, we can discuss the Xanax and Ambien in the evenings and see if we can decrease that. If she has any more episodes of falls with loss of consciousness. May need a cardiac evaluation for abnormal arrhythmias  REVIEW OF SYSTEMS: Out of a complete 14 system review of symptoms, the patient complains only of the following symptoms,  and all other reviewed systems are negative.  Nausea, blurred vision, fatigue, hearing loss, ringing in ears, depression, nervous/anxious, numbness, headache, dizziness  ALLERGIES: Allergies  Allergen Reactions  . Topiramate Nausea Only    HOME MEDICATIONS: Outpatient Medications Prior to Visit  Medication Sig Dispense Refill  . ALPRAZolam (XANAX) 1 MG tablet TAKE 1 TABLET BY MOUTH 3 TIMES DAILY AS NEEDED  FOR ANXIETY 90 tablet 4  . Biotin 5000 MCG CAPS Take by mouth daily.    . Calcium Carbonate-Vitamin D (CALCIUM 600 + D PO) Take 1 tablet by mouth daily.     . cyanocobalamin 2000 MCG tablet Take 2,000 mcg by mouth daily.    Marland Kitchen escitalopram (LEXAPRO) 10 MG tablet Take 1 tablet (10 mg total) by mouth daily. (Patient taking differently: Take 20 mg by mouth daily. ) 90 tablet 3  . ferrous sulfate 325 (65 FE) MG tablet Take 325 mg by mouth daily with breakfast.    . ibuprofen (ADVIL,MOTRIN) 600 MG tablet Take 1 tablet (600 mg total) by mouth every 6 (six) hours as needed. 30 tablet 0  . Multiple Vitamins-Minerals (MULTIVITAMIN ADULT PO) Take 1 tablet by mouth.    . zolpidem (AMBIEN) 10 MG tablet TAKE 1 TABLET BY MOUTH AT BEDTIME AS NEEDED 30 tablet 2   Facility-Administered Medications Prior to Visit  Medication Dose Route Frequency Provider Last Rate Last Dose  . 0.9 %  sodium chloride infusion  500 mL Intravenous Continuous Doran Stabler, MD        PAST MEDICAL HISTORY: Past Medical History:  Diagnosis Date  . Anal condyloma   . Deafness in left ear   . Hearing loss, sensorineural, high frequency    RIGHT EAR  . History of peptic ulcer   . Otosclerosis of left ear   . PCO (polycystic ovaries)   . PONV (postoperative nausea and vomiting)   . 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 Wears hearing aid    both ears    PAST SURGICAL HISTORY: Past Surgical History:  Procedure Laterality Date  . BLEPHAROPLASTY  03-05-2000  . CRANIOTOMY  08/2014   to repair dehescence in right ear  . HAMMER TOE SURGERY Left 08-29-2013   2nd toe  . IMPLANTATION BONE ANCHORED HEARING AID Left 07/27/2008   left temporal bone;now removed  . LAPAROSCOPIC CHOLECYSTECTOMY  03-31-2005  . LAPAROSCOPIC GASTRIC BANDING  02-08-2007  . LASER ABLATION CONDOLAMATA N/A 07/30/2012   Procedure: LASER ABLATION CONDOLAMATA;  Surgeon: Leighton Ruff, MD;  Location: [facility];  Service:  General;  Laterality: N/A;  . LASIK Bilateral 2002  . ROUX-EN-Y GASTRIC BYPASS  AUG 2013  . [place];  1994 x2;  02-03-1998  . STRABISMUS SURGERY Right   . VAGINAL HYSTERECTOMY  2007   fibroids    FAMILY HISTORY: Family History  Problem Relation Age of Onset  . Heart attack Mother   . Heart disease Mother   . Cancer Father     unknown primary  . Stroke Sister   . Diabetes Sister   . Diabetes Maternal Aunt   . Heart disease Maternal Aunt   . Cancer Paternal Aunt   . Cancer Paternal Uncle   . Colon cancer Neg Hx   . Colon polyps Neg Hx     SOCIAL HISTORY: Social History   Social History  . Marital status: Married    Spouse name: Vicenta Dunning   . Number of children: 1  .  Years of education: MBA   Occupational History  . Director of [employer]   Social History Main Topics  . Smoking status: Never Smoker  . Smokeless tobacco: Never Used  . Alcohol use No  . Drug use: No  . Sexual activity: Yes    Birth control/ protection: Surgical   Other Topics Concern  . Not on file   Social History Narrative   Lives at home with husband and daughter.   Left handed.   Caffeine use: 2-3 cups (coffee) per day       PHYSICAL EXAM  Vitals:   01/22/16 1457  BP: 116/70  Pulse: 63  Weight: 158 lb 12.8 oz (72 kg)   Body mass index is 29.52 kg/m.  Generalized: Well developed, in no acute distress   Neurological examination  Mentation: Alert oriented to time, place, history taking. Follows all commands speech and language fluent Cranial nerve II-XII: Pupils were equal round reactive to light. Extraocular movements were full, visual field were full on confrontational test. Facial sensation and strength were normal. Uvula tongue midline. Head turning and shoulder shrug  were normal and symmetric. Motor: The motor testing reveals 5 over 5 strength of all 4 extremities. Good symmetric motor tone is noted throughout.  Sensory: Sensory  testing is intact to soft touch on all 4 extremities. No evidence of extinction is noted.  Coordination: Cerebellar testing reveals good finger-nose-finger and heel-to-shin bilaterally.  Gait and station: Gait is slightly unsteady. Tandem gait not attempted Romberg is negative. Reflexes: Deep tendon reflexes are symmetric and normal bilaterally.   DIAGNOSTIC DATA (LABS, IMAGING, TESTING) - I reviewed patient records, labs, notes, testing and imaging myself where available.  Lab Results  Component Value Date   WBC 6.8 06/22/2014   HGB 13.4 06/22/2014   HCT 38.6 06/22/2014   MCV 77.5 (L) 06/22/2014   PLT 190 06/22/2014      Component Value Date/Time   NA 144 09/07/2015 1014   NA 141 04/21/2014 0930   K 4.0 09/07/2015 1014   CL 106 09/07/2015 1014   CO2 28 09/07/2015 1014   GLUCOSE 86 09/07/2015 1014   BUN 13 09/07/2015 1014   BUN 14 04/21/2014 0930   CREATININE 0.64 09/07/2015 1014   CALCIUM 8.6 09/07/2015 1014   PROT 6.0 (L) 09/07/2015 1014   PROT 6.0 (L) 09/07/2015 1014   PROT 7.1 04/21/2014 0930   ALBUMIN 3.5 (L) 09/07/2015 1014   ALBUMIN 3.5 (L) 09/07/2015 1014   ALBUMIN 4.4 04/21/2014 0930   AST 25 09/07/2015 1014   AST 25 09/07/2015 1014   ALT 18 09/07/2015 1014   ALT 18 09/07/2015 1014   ALKPHOS 63 09/07/2015 1014   ALKPHOS 63 09/07/2015 1014   BILITOT 0.5 09/07/2015 1014   BILITOT 0.5 09/07/2015 1014   BILITOT 0.3 04/21/2014 0930   GFRNONAA >60 06/22/2014 1650   GFRAA >60 06/22/2014 1650   Lab Results  Component Value Date   CHOL 132 06/16/2012   HDL 63 06/16/2012   LDLCALC 56 06/16/2012   TRIG 67 06/16/2012   CHOLHDL 2.1 06/16/2012   No results found for: HGBA1C Lab Results  Component Value Date   OZHYQMVH84 696 06/16/2012   Lab Results  Component Value Date   TSH 1.990 04/21/2014      ASSESSMENT AND PLAN 66 y.o. 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 here with:   1. Migraine headaches 2. Superior semicircular canal dehiscence  The patient has developed what sounds like migraine headaches. We will try the patient on gabapentin taking 100 mg at bedtime for 2 weeks and increasing to 200 mg at bedtime. I have reviewed the side effects with the patient. We will also check blood work today- sedimentation rate and CRP to rule out temporal arteritis. If the patient continues to have headaches on gabapentin we may consider repeating MRI of the brain in the future. Patient has an appointment already scheduled in February with Dr. Jaynee Eagles- she requested to keep this appointment. She will follow-up then    Ward Givens, MSN, NP-C 01/22/2016, 3:16 PM [facility] [address], [place], [place] [address] [phone]

## 2016-01-22 NOTE — Patient Instructions (Addendum)
Start gabapentin 100 mg at bedtime for 2 weeks then increase to 200 mg at bedtime Blood work today If your symptoms worsen or you develop new symptoms please let us know.

## 2016-01-22 NOTE — Telephone Encounter (Signed)
That's fine, Denise Rice has a 3pm appointment today and I can step in and say hello. Ask Denise Rice if that would be ok thanks

## 2016-01-22 NOTE — Telephone Encounter (Signed)
Called and spoke to pt. Pt agreeable to see MM,NP today at 3pm. Advised her to check in no later than 245pm. She verbalized understanding.  MM,NP agreeable to see patient per AA,MD request.

## 2016-01-22 NOTE — Telephone Encounter (Signed)
Dr Jaynee Eagles- is she ok to see NP?

## 2016-01-22 NOTE — Telephone Encounter (Signed)
Patient is calling stating she has had a headache for 3-4 weeks. I scheduled the patient for 02-26-16 but she thinks she needs to be seen sooner.

## 2016-01-23 ENCOUNTER — Telehealth: Payer: Self-pay

## 2016-01-23 LAB — SEDIMENTATION RATE: Sed Rate: 3 mm/hr (ref 0–40)

## 2016-01-23 LAB — C-REACTIVE PROTEIN: CRP: 0.7 mg/L (ref 0.0–4.9)

## 2016-01-23 NOTE — Telephone Encounter (Signed)
-----   Message from Ward Givens, NP sent at 01/23/2016  1:19 PM EST ----- Lab work normal. Please call patient.

## 2016-01-23 NOTE — Telephone Encounter (Signed)
I spoke to pt and advised her that her lab work was normal. Pt verbalized understanding of results. Pt had no questions at this time but was encouraged to call back if questions arise.

## 2016-01-28 ENCOUNTER — Encounter: Payer: Self-pay | Admitting: Neurology

## 2016-01-29 ENCOUNTER — Other Ambulatory Visit: Payer: Self-pay | Admitting: Neurology

## 2016-01-29 ENCOUNTER — Telehealth: Payer: Self-pay | Admitting: Neurology

## 2016-01-29 MED ORDER — ZONISAMIDE 50 MG PO CAPS: 50.0000 mg | ORAL_CAPSULE | Freq: Every day | ORAL | 6 refills | Status: DC

## 2016-01-29 MED FILL — ZONISAMIDE 50 MG CAPSULE: 50 | 30 days supply | Qty: 30 | Fill #0

## 2016-01-29 NOTE — Telephone Encounter (Signed)
I called it in thanks!

## 2016-01-29 NOTE — Telephone Encounter (Signed)
Dr Jaynee Eagles- I see where you have been speaking with pt via mychart. Can you call in rx zonisamide? Thank you!

## 2016-01-29 NOTE — Telephone Encounter (Signed)
Pt is wanting zonisamide called to [facility].

## 2016-02-03 NOTE — Progress Notes (Signed)
  Personally participated in and made any corrections needed to history, physical, neuro exam,assessment and plan as stated above and agree with plan.   Laveda Demedeiros, MD 

## 2016-02-04 DIAGNOSIS — R945 Abnormal results of liver function studies: Secondary | ICD-10-CM | POA: Diagnosis not present

## 2016-02-04 DIAGNOSIS — E559 Vitamin D deficiency, unspecified: Secondary | ICD-10-CM | POA: Diagnosis not present

## 2016-02-06 DIAGNOSIS — E559 Vitamin D deficiency, unspecified: Secondary | ICD-10-CM | POA: Diagnosis not present

## 2016-02-06 DIAGNOSIS — R0982 Postnasal drip: Secondary | ICD-10-CM | POA: Diagnosis not present

## 2016-02-06 DIAGNOSIS — Z23 Encounter for immunization: Secondary | ICD-10-CM | POA: Diagnosis not present

## 2016-02-06 DIAGNOSIS — Z6828 Body mass index (BMI) 28.0-28.9, adult: Secondary | ICD-10-CM | POA: Diagnosis not present

## 2016-02-07 MED FILL — ALPRAZolam 1 MG TABS: 1 | 30 days supply | Qty: 90 | Fill #3

## 2016-02-07 MED FILL — ZOLPIDEM TARTRATE 10 MG TAB: 10 | 30 days supply | Qty: 30 | Fill #2

## 2016-02-26 ENCOUNTER — Ambulatory Visit: Payer: 59 | Admitting: Neurology

## 2016-03-06 MED FILL — ZOLPIDEM TARTRATE 10 MG TAB: 10 | 30 days supply | Qty: 30 | Fill #0

## 2016-03-06 MED FILL — ALPRAZolam 1 MG TABS: 1 | 30 days supply | Qty: 90 | Fill #0

## 2016-03-17 MED FILL — ESCITALOPRAM 20 MG TABLET: 20 | 90 days supply | Qty: 90 | Fill #2

## 2016-03-17 MED FILL — ZONISAMIDE 50 MG CAPSULE: 50 | 30 days supply | Qty: 30 | Fill #1

## 2016-04-02 MED FILL — ALPRAZolam 1 MG TABS: 1 | 30 days supply | Qty: 90 | Fill #1

## 2016-04-03 MED FILL — ZOLPIDEM TARTRATE 10 MG TAB: 10 | 30 days supply | Qty: 30 | Fill #1

## 2016-05-01 MED FILL — ALPRAZolam 1 MG TABS: 1 | 30 days supply | Qty: 90 | Fill #2

## 2016-05-01 MED FILL — ZOLPIDEM TARTRATE 10 MG TAB: 10 | 30 days supply | Qty: 30 | Fill #2

## 2016-05-28 ENCOUNTER — Encounter: Payer: Self-pay | Admitting: Gynecology

## 2016-05-29 MED FILL — ALPRAZolam 1 MG TABS: 1 | 30 days supply | Qty: 90 | Fill #3

## 2016-05-29 MED FILL — ZOLPIDEM TARTRATE 10 MG TAB: 10 | 30 days supply | Qty: 30 | Fill #3

## 2016-06-16 MED FILL — AMOXICILLIN 500 MG CAPSULE: 500 | 7 days supply | Qty: 28 | Fill #0

## 2016-06-16 MED FILL — ACETAMINOPHEN/COD #3 TABLET: 300-30 | 2 days supply | Qty: 9 | Fill #0

## 2016-06-18 ENCOUNTER — Ambulatory Visit: Payer: 59 | Admitting: Obstetrics and Gynecology

## 2016-06-19 ENCOUNTER — Ambulatory Visit (INDEPENDENT_AMBULATORY_CARE_PROVIDER_SITE_OTHER): Payer: 59 | Admitting: Obstetrics & Gynecology

## 2016-06-19 ENCOUNTER — Encounter: Payer: Self-pay | Admitting: Obstetrics & Gynecology

## 2016-06-19 ENCOUNTER — Other Ambulatory Visit: Payer: Self-pay | Admitting: Obstetrics & Gynecology

## 2016-06-19 VITALS — BP 110/66 | HR 76 | Resp 16 | Ht 61.5 in | Wt 153.0 lb

## 2016-06-19 DIAGNOSIS — N644 Mastodynia: Secondary | ICD-10-CM | POA: Diagnosis not present

## 2016-06-19 DIAGNOSIS — Z1231 Encounter for screening mammogram for malignant neoplasm of breast: Secondary | ICD-10-CM

## 2016-06-19 NOTE — Progress Notes (Signed)
Patient ID: Denise Rice, female   DOB: November 26, 1950, 66 y.o.   MRN: [id number] GYNECOLOGY  VISIT   HPI: 66 y.o. G0P0 Married Caucasian female here for complaint of left breast pain that has been present over the last week.  Denies trauma but does have some balance issues so thinks it is possible that she could have had some trauma.  Denies bruising, nipple discharge, skin change.  Her last MMG was 2/16 so knows she is overdue.  Cannot feel any masses.  GYNECOLOGIC HISTORY: Patient's last menstrual period was 01/13/2005. Contraception: PMP Menopausal hormone therapy: none  Patient Active Problem List   Diagnosis Date Noted  . Insomnia 09/08/2015  . Bilateral sensorineural hearing loss 09/06/2014  . 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 Dizziness and giddiness 04/29/2014  . 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 Hearing loss sensory, bilateral 06/25/2012  . Cochlear implant in place 06/25/2012  . History of laparoscopic adjustable gastric banding 01/22/2011  . 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 Anal condyloma   . Deafness in left ear   . Hearing loss, sensorineural, high frequency    RIGHT EAR  . History of peptic ulcer   . Otosclerosis of left ear   . PCO (polycystic ovaries)   . PONV (postoperative nausea and vomiting)   . 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 Wears hearing aid    both ears    Past Surgical History:  Procedure Laterality Date  . BLEPHAROPLASTY  03-05-2000  . CRANIOTOMY  08/2014   to repair dehescence in right ear  . HAMMER TOE SURGERY Left 08-29-2013   2nd toe  . IMPLANTATION BONE ANCHORED HEARING AID Left 07/27/2008   left temporal bone;now removed  . LAPAROSCOPIC CHOLECYSTECTOMY  03-31-2005  . LAPAROSCOPIC GASTRIC BANDING  02-08-2007  . LASER ABLATION CONDOLAMATA N/A 07/30/2012   Procedure: LASER ABLATION CONDOLAMATA;  Surgeon: Leighton Ruff, MD;  Location: [facility];  Service: General;  Laterality: N/A;  . LASIK Bilateral 2002  . ROUX-EN-Y GASTRIC BYPASS  AUG 2013  . [place];  1994 x2;  02-03-1998  . STRABISMUS SURGERY Right   . VAGINAL HYSTERECTOMY  2007   fibroids    MEDS:  Reviewed in EPIC and UTD  ALLERGIES: Topiramate  Family History  Problem Relation Age of Onset  . Heart attack Mother   . Heart disease Mother   . Cancer Father        unknown primary  . Stroke Sister   . Diabetes Sister   . Diabetes Maternal Aunt   . Heart disease Maternal Aunt   . Cancer Paternal Aunt   . Cancer Paternal Uncle   . Colon cancer Neg Hx   . Colon polyps Neg Hx     SH:  Married, non smoker  Review of Systems  All other systems reviewed and are negative.   PHYSICAL EXAMINATION:    BP 110/66 (BP Location: Right Arm, Patient Position: Sitting, Cuff Size: Normal)   Pulse 76   Resp 16   Ht 5' 1.5" (1.562 m)   Wt 153 lb (69.4 kg)   LMP 01/13/2005   BMI 28.44 kg/m     Physical Exam  Constitutional: She appears well-developed and well-nourished.  Neck: Normal range of motion. Neck supple. No thyromegaly present.  Cardiovascular: Normal rate and regular rhythm.   Respiratory: Effort normal  and breath sounds normal. Right breast exhibits no inverted nipple, no mass, no nipple discharge, no skin change and no tenderness. Left breast exhibits tenderness. Left breast exhibits no inverted nipple, no mass, no nipple discharge and no skin change. Breasts are symmetrical.    Lymphadenopathy:       Right axillary: No pectoral and no lateral adenopathy present.       Left axillary: No pectoral and no lateral adenopathy present.      Right: No supraclavicular adenopathy present.       Left: No supraclavicular adenopathy present.   Assessment: Left breast pain.  No findings on physical exam except tenderness.  Plan: Will help pt get mammogram scheduled for tomorrow if possible.

## 2016-06-19 NOTE — Progress Notes (Signed)
Patient scheduled while in office. Spoke with Cherish at [facility]. Patient scheduled for screening MMG on 06/20/16 arriving at 8:30am for 8:50am appointment. Patient is agreeable to date and time.

## 2016-06-20 ENCOUNTER — Ambulatory Visit
Admission: RE | Admit: 2016-06-20 | Discharge: 2016-06-20 | Disposition: A | Discharge status: Home / Self Care | Payer: 59 | Source: Ambulatory Visit | Attending: Obstetrics & Gynecology | Admitting: Obstetrics & Gynecology

## 2016-06-20 DIAGNOSIS — Z1231 Encounter for screening mammogram for malignant neoplasm of breast: Secondary | ICD-10-CM

## 2016-06-22 ENCOUNTER — Encounter: Payer: Self-pay | Admitting: Obstetrics & Gynecology

## 2016-06-25 MED FILL — ALPRAZolam 1 MG TABS: 1 | 30 days supply | Qty: 90 | Fill #4

## 2016-06-25 MED FILL — ZOLPIDEM TARTRATE 10 MG TAB: 10 | 30 days supply | Qty: 30 | Fill #4

## 2016-07-01 ENCOUNTER — Telehealth: Payer: Self-pay | Admitting: Neurology

## 2016-07-01 NOTE — Telephone Encounter (Signed)
Returned call and pt agreed to come in tomorrow for 9:30 appt.

## 2016-07-01 NOTE — Telephone Encounter (Signed)
Pt fell out of the bed early Monday morning not waking up when she fell on the floor. Pt is very achey and concerned. She is requesting an appt with Dr Jaynee Eagles.

## 2016-07-02 ENCOUNTER — Ambulatory Visit (INDEPENDENT_AMBULATORY_CARE_PROVIDER_SITE_OTHER): Payer: 59 | Admitting: Neurology

## 2016-07-02 ENCOUNTER — Encounter: Payer: Self-pay | Admitting: Neurology

## 2016-07-02 ENCOUNTER — Other Ambulatory Visit: Payer: Self-pay

## 2016-07-02 VITALS — BP 121/89 | HR 59 | Ht 61.5 in | Wt 156.8 lb

## 2016-07-02 DIAGNOSIS — W19XXXA Unspecified fall, initial encounter: Secondary | ICD-10-CM

## 2016-07-02 DIAGNOSIS — M542 Cervicalgia: Secondary | ICD-10-CM | POA: Diagnosis not present

## 2016-07-02 DIAGNOSIS — R4020 Unspecified coma: Secondary | ICD-10-CM

## 2016-07-02 DIAGNOSIS — M7918 Myalgia, other site: Secondary | ICD-10-CM

## 2016-07-02 DIAGNOSIS — M791 Myalgia, unspecified site: Secondary | ICD-10-CM

## 2016-07-02 DIAGNOSIS — R29898 Other symptoms and signs involving the musculoskeletal system: Secondary | ICD-10-CM

## 2016-07-02 MED ORDER — TRAMADOL HCL 50 MG PO TABS: 50.0000 mg | ORAL_TABLET | Freq: Four times a day (QID) | ORAL | 2 refills | Status: DC | PRN

## 2016-07-02 MED FILL — traMADol HCL 50 MG TABS: 50 | 15 days supply | Qty: 60 | Fill #0

## 2016-07-02 NOTE — Progress Notes (Signed)
[facility]    Provider:  Dr Jaynee Eagles Referring Provider: Fanny Bien, MD Primary Care Physician:  Fanny Bien, MD  CC:  fall out of bed  Interval update 07/02/2016:  Denise Rice is a lovely 66 y.o. female here as a referral from Dr. 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 She was last seen in January of this year with headaches that usually occur 2-3 times a week. Headaches occur on the right side. She noted some scalp sensitivity especially with brushing her hair. She reported nausea but no vomiting. Also blurry vision with her headaches. She has photophobia. Dizziness. Since then the headaches have resolved.   She is here with a new issue. She fell out of bed and woke up on the floor, she didn't even know she had fell out of bed. She was in such pain it took her 20 minutes to get of fof the bed, this happened Monday early morning. She is still in pain, she is having trouble walking, bending, getting up and sitting down. She denies any head trauma or known bruising. She hurts badly from her buttocks down her legs and in her neck. A lot of stiffness. She feels like she ran a marathon. She went to bed at 10pm and woke up at 2am so maximum 4 hours. Her bedsheets were on the bed. She is not mentally impaired, just physically she feels like he is 29. She is using a cane. No one particular spot just all over musculoskeletal pain. Lots of stress. She takes [place] and xanax at night but she has been doing this for years nothing new, no alcohol. Advil helps with pain. She is going to get a massage.   Interval Update 12/18/2014: Denise Rice is a lovely 66 y.o. female here as a referral from Dr. 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 She had her surgery. After about 8 weeks her hearing improved and her vertigo improved. She is in rehab for her vertigo. She is  hearing better. She tires very easily. No falls. She still has dizziness but nbithing significant.  HPI: Denise Rice is a lovely 66 y.o. female here as a referral from Dr. 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 She has had multiple falls, most recently in her kitchen. She does not remember falling. She was facing the fridge and truned around and next thing she remembers she was on the ground. She did not proceed to the ED. She had a split lip and there was blood everywhere. Her nose is hurting on the bridge but reports no further alteration of consciousness or confusion. She has been having headaches since hitting her head but they are improving. No irritability, no decreased concentration, no increase in dizziness, no further memory changes or other post-concussive symptma. The headaches feel dull throbbing. She is better, hasn;t had one in a few days. This is the first time she has fallen and has no recollection of the fall. Discussed at length, it appears as though she may have hit her head pretty hard on the floor and possibly cause some memory loss. I did encourage her to go to the emergency room next time for evaluation of intracranial hematomas as this can be a serious complication of falls.   Patient is scheduled for repair of the semicircular canals. She has been told she may be dizzier  afterwards but over the long term her surgeon is hopeful it will help. She had her first surgery in 1970. She is using a cane. She feels good today. Yesterday she also felt well. Walking makes her feel worse. The Xanax 12m twice a day helps a lot for the disequilibrium. The scopolamine patch helped but was too expensive. She will be admitted on July [facility]nd to [facility]. She takes aAzerbaij[place]very night. Discussed that at this time it's important for her to concentrate on her surgery and get better. After her surgery and recovery, we can discuss the Xanax and  Ambien in the evenings and see if we can decrease that. If she has any more episodes of falls with loss of consciousness. May need a cardiac evaluation for abnormal arrhythmias  Review of Systems: Patient complains of symptoms per HPI as well as the following symptoms: hearing loss, fatigue, dizziness. Pertinent negatives per HPI. All others negative.  Social History   Social History  . Marital status: Married    Spouse name: MVicenta Dunning  . Number of children: 1  . Years of education: MBA   Occupational History  . Director of [employer]  Social History Main Topics  . Smoking status: Never Smoker  . Smokeless tobacco: Never Used  . Alcohol use No  . Drug use: No  . Sexual activity: Yes    Birth control/ protection: Surgical   Other Topics Concern  . Not on file   Social History Narrative   Lives at home with husband and daughter.   Left handed.   Caffeine use: 2-3 cups (coffee) per day     Family History  Problem Relation Age of Onset  . Heart attack Mother   . Heart disease Mother   . Cancer Father        unknown primary  . Stroke Sister   . Diabetes Sister   . Diabetes Maternal Aunt   . Heart disease Maternal Aunt   . Cancer Paternal Aunt   . Cancer Paternal Uncle   . Colon cancer Neg Hx   . Colon polyps Neg Hx     Past Medical History:  Diagnosis Date  . Anal condyloma   . Deafness in left ear   . Hearing loss, sensorineural, high frequency    RIGHT EAR  . History of peptic ulcer   . Otosclerosis of left ear   . PCO (polycystic ovaries)   . PONV (postoperative nausea and vomiting)   . 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 Wears hearing aid    both ears    Past Surgical History:  Procedure Laterality Date  . BLEPHAROPLASTY  03-05-2000  . CRANIOTOMY  08/2014   to repair dehescence in right ear  . HAMMER TOE SURGERY Left 08-29-2013   2nd toe  . IMPLANTATION BONE ANCHORED HEARING AID Left 07/27/2008    left temporal bone;now removed  . LAPAROSCOPIC CHOLECYSTECTOMY  03-31-2005  . LAPAROSCOPIC GASTRIC BANDING  02-08-2007  . LASER ABLATION CONDOLAMATA N/A 07/30/2012   Procedure: LASER ABLATION CONDOLAMATA;  Surgeon: ALeighton Ruff MD;  [facility]  Service: General;  Laterality: N/A;  . LASIK Bilateral 2002  . ROUX-EN-Y GASTRIC BYPASS  AUG 2013  . [place]  1994 x2;  02-03-1998  . STRABISMUS SURGERY Right   . VAGINAL HYSTERECTOMY  2007   fibroids    Current Outpatient Prescriptions  Medication Sig Dispense Refill  .  ALPRAZolam (XANAX) 1 MG tablet TAKE 1 TABLET BY MOUTH 3 TIMES DAILY AS NEEDED FOR ANXIETY 90 tablet 4  . Biotin 5000 MCG CAPS Take by mouth daily.    . Calcium Carbonate-Vitamin D (CALCIUM 600 + D PO) Take 1 tablet by mouth daily.     . cyanocobalamin 2000 MCG tablet Take 2,000 mcg by mouth daily.    Marland Kitchen escitalopram (LEXAPRO) 20 MG tablet Take 1 tablet by mouth daily.  3  . ferrous sulfate 325 (65 FE) MG tablet Take 325 mg by mouth daily with breakfast.    . ibuprofen (ADVIL,MOTRIN) 600 MG tablet Take 1 tablet (600 mg total) by mouth every 6 (six) hours as needed. 30 tablet 0  . Multiple Vitamins-Minerals (MULTIVITAMIN ADULT PO) Take 1 tablet by mouth.    . zolpidem (AMBIEN) 10 MG tablet TAKE 1 TABLET BY MOUTH AT BEDTIME AS NEEDED 30 tablet 2  . traMADol (ULTRAM) 50 MG tablet Take 1 tablet (50 mg total) by mouth every 6 (six) hours as needed. 60 tablet 2   Current Facility-Administered Medications  Medication Dose Route Frequency Provider Last Rate Last Dose  . 0.9 %  sodium chloride infusion  500 mL Intravenous Continuous Doran Stabler, MD        Allergies as of 07/02/2016 - Review Complete 07/02/2016  Allergen Reaction Noted  . Topiramate Nausea Only 01/22/2016    Vitals: BP 121/89   Pulse (!) 59   Ht 5' 1.5" (1.562 m)   Wt 156 lb 12.8 oz (71.1 kg)   LMP 01/13/2005   BMI 29.15 kg/m  Last Weight:  Wt Readings  from Last 1 Encounters:  07/02/16 156 lb 12.8 oz (71.1 kg)   Last Height:   Ht Readings from Last 1 Encounters:  07/02/16 5' 1.5" (1.562 m)   MSK: Hypertrophy and tenderness on palpation of the bilateral trapezius, generalized muscle pain and fatigue on palpation. No point tenderness. Range of motion is normal except for mildly decreased in the neck region. No obvious bruises or lesions or cuts.   Cranial Nerves:  The pupils are equal, round, and reactive to light. Visual fields are full. Extraocular movements are intact. Nystagmus on right end gaze with fast component to the right. The face is symmetric. Hearing impaired. Voice is normal.   Coordination:  No dysmetria  Motor Observation:  No asymmetry, no atrophy, and no involuntary movements noted.  Posture:  Posture is normal.      Assessment/Plan: 66 year old female with dehiscence of the superior semicircular canals and chronic disequilibrium syndrome s/p surgery and doing well.  She is here for a new issue, patient fell out of bed the other night and woke up on the floor. She had no idea she fell out of bed. Since then she's been having diffuse musculoskeletal pain. Unclear how long she was on the floor anywhere upwards of 4 hours. Concern is for seizures, she's also had recent head trauma I think we should MRI of the brain to evaluate for any seizure focuses or recent hemorrhages that could cause symptoms, routine EEG and then extended EEG to monitor overnight for seizures, we'll check routine labs today including CK, advise tramadol and heat and massage. Advised the risks of tramadol including sedation and risk of falls.  - MRI brain w/wo contrast - Routine EEG and then extended EEG - Labs today including cbc, cmp, ck - Massage and heat - Tramadol as needed - Trigger points into the neck for neck pain and  decreased range of motion and hypertrophied tight muscles  Orders Placed This Encounter  Procedures  .  MR BRAIN W WO CONTRAST  . Comprehensive metabolic panel  . CBC  . CK  . Sedimentation rate  . C-reactive protein  . Magnesium  . Lactic acid, plasma  . TSH  . Multiple Myeloma Panel (SPEP&IFE w/QIG)  . EEG   Performed by Dr. Jaynee Eagles M.D.  All procedures a documented blood were medically necessary, reasonable and appropriate based on the patient's history, medical diagnosis and physician opinion. Verbal informed consent was obtained from the patient, patient was informed of potential risk of procedure, including bruising, bleeding, hematoma formation, infection, muscle weakness, muscle pain, numbness, transient hypertension, transient hyperglycemia and transient insomnia among others. All areas injected were topically clean with isopropyl rubbing alcohol. Nonsterile nonlatex gloves were worn during the procedure.  1. Trigger point injections bilateral trapezius 20552   cc: Dr. Elonda Husky, MD  [facility] [address] [place], [place] [address]  Phone [phone] Fax [phone]  A total of 30 minutes was spent face-to-face with this patient. Over half this time was spent on counseling patient on the fall, loss of consciousness, musculoskeletal pain diagnosis and different diagnostic and therapeutic options available. This does not include the time taken for trigger point injections.

## 2016-07-02 NOTE — Patient Instructions (Signed)
Remember to drink plenty of fluid, eat healthy meals and do not skip any meals. Try to eat protein with a every meal and eat a healthy snack such as fruit or nuts in between meals. Try to keep a regular sleep-wake schedule and try to exercise daily, particularly in the form of walking, 20-30 minutes a day, if you can.   As far as your medications are concerned, I would like to suggest; Tramadol as needed  As far as diagnostic testing: Labs  I would like to see you back in 4 weeks, sooner if we need to. Please call us with any interim questions, concerns, problems, updates or refill requests.   Our phone number is [phone]. We also have an after hours call service for urgent matters and there is a physician on-call for urgent questions. For any emergencies you know to call 911 or go to the nearest emergency room  Tramadol tablets What is this medicine? TRAMADOL (TRA ma dole) is a pain reliever. It is used to treat moderate to severe pain in adults. This medicine may be used for other purposes; ask your health care provider or pharmacist if you have questions. COMMON BRAND NAME(S): Ultram What should I tell my health care provider before I take this medicine? They need to know if you have any of these conditions: -brain tumor -depression -drug abuse or addiction -head injury -if you frequently drink alcohol containing drinks -kidney disease or trouble passing urine -liver disease -lung disease, asthma, or breathing problems -seizures or epilepsy -suicidal thoughts, plans, or attempt; a previous suicide attempt by you or a family member -an unusual or allergic reaction to tramadol, codeine, other medicines, foods, dyes, or preservatives -pregnant or trying to get pregnant -breast-feeding How should I use this medicine? Take this medicine by mouth with a full glass of water. Follow the directions on the prescription label. You can take it with or without food. If it upsets your  stomach, take it with food. Do not take your medicine more often than directed. A special MedGuide will be given to you by the pharmacist with each prescription and refill. Be sure to read this information carefully each time. Talk to your pediatrician regarding the use of this medicine in children. Special care may be needed. Overdosage: If you think you have taken too much of this medicine contact a poison control center or emergency room at once. NOTE: This medicine is only for you. Do not share this medicine with others. What if I miss a dose? If you miss a dose, take it as soon as you can. If it is almost time for your next dose, take only that dose. Do not take double or extra doses. What may interact with this medicine? Do not take this medication with any of the following medicines: -MAOIs like Carbex, Eldepryl, Marplan, Nardil, and Parnate This medicine may also interact with the following medications: -alcohol -antihistamines for allergy, cough and cold -certain medicines for anxiety or sleep -certain medicines for depression like amitriptyline, fluoxetine, sertraline -certain medicines for migraine headache like almotriptan, eletriptan, frovatriptan, naratriptan, rizatriptan, sumatriptan, zolmitriptan -certain medicines for seizures like carbamazepine, oxcarbazepine, phenobarbital, primidone -certain medicines that treat or prevent blood clots like warfarin -digoxin -furazolidone -general anesthetics like halothane, isoflurane, methoxyflurane, propofol -linezolid -local anesthetics like lidocaine, pramoxine, tetracaine -medicines that relax muscles for surgery -other narcotic medicines for pain or cough -phenothiazines like chlorpromazine, mesoridazine, prochlorperazine, thioridazine -procarbazine This list may not describe all possible interactions. Give your health care  provider a list of all the medicines, herbs, non-prescription drugs, or dietary supplements you use. Also  tell them if you smoke, drink alcohol, or use illegal drugs. Some items may interact with your medicine. What should I watch for while using this medicine? Tell your doctor or health care professional if your pain does not go away, if it gets worse, or if you have new or a different type of pain. You may develop tolerance to the medicine. Tolerance means that you will need a higher dose of the medicine for pain relief. Tolerance is normal and is expected if you take this medicine for a long time. Do not suddenly stop taking your medicine because you may develop a severe reaction. Your body becomes used to the medicine. This does NOT mean you are addicted. Addiction is a behavior related to getting and using a drug for a non-medical reason. If you have pain, you have a medical reason to take pain medicine. Your doctor will tell you how much medicine to take. If your doctor wants you to stop the medicine, the dose will be slowly lowered over time to avoid any side effects. There are different types of narcotic medicines (opiates). If you take more than one type at the same time or if you are taking another medicine that also causes drowsiness, you may have more side effects. Give your health care provider a list of all medicines you use. Your doctor will tell you how much medicine to take. Do not take more medicine than directed. Call emergency for help if you have problems breathing or unusual sleepiness. You may get drowsy or dizzy. Do not drive, use machinery, or do anything that needs mental alertness until you know how this medicine affects you. Do not stand or sit up quickly, especially if you are an older patient. This reduces the risk of dizzy or fainting spells. Alcohol can increase or decrease the effects of this medicine. Avoid alcoholic drinks. You may have constipation. Try to have a bowel movement at least every 2 to 3 days. If you do not have a bowel movement for 3 days, call your doctor or health  care professional. Your mouth may get dry. Chewing sugarless gum or sucking hard candy, and drinking plenty of water may help. Contact your doctor if the problem does not go away or is severe. What side effects may I notice from receiving this medicine? Side effects that you should report to your doctor or health care professional as soon as possible: -allergic reactions like skin rash, itching or hives, swelling of the face, lips, or tongue -breathing problems -confusion -seizures -signs and symptoms of low blood pressure like dizziness; feeling faint or lightheaded, falls; unusually weak or tired -trouble passing urine or change in the amount of urine Side effects that usually do not require medical attention (report to your doctor or health care professional if they continue or are bothersome): -constipation -dry mouth -nausea, vomiting -tiredness This list may not describe all possible side effects. Call your doctor for medical advice about side effects. You may report side effects to FDA at 1-800-FDA-1088. Where should I keep my medicine? Keep out of the reach of children. This medicine may cause accidental overdose and death if it taken by other adults, children, or pets. Mix any unused medicine with a substance like cat litter or coffee grounds. Then throw the medicine away in a sealed container like a sealed bag or a coffee can with a lid. Do not  use the medicine after the expiration date. Store at room temperature between 15 and 30 degrees C (59 and 86 degrees F). NOTE: This sheet is a summary. It may not cover all possible information. If you have questions about this medicine, talk to your doctor, pharmacist, or health care provider.  2018 Elsevier/Gold Standard (2014-09-24 09:00:04)

## 2016-07-02 NOTE — Progress Notes (Signed)
Nerve block:  Xylocaine 2% (20 mg/ml) NDC 86578-469-62 Lot [id number] Exp 02/21  Bupivacaine 0.25% NDC 24401-027-25 Lot [id number] Exp: 03/22  Depo-medrol 80 mg/ml NDC 4742-5956-38 Lot [id number] Exp 09/2016  Two 3 ml syringes with 1 ml marcaine, 1 ml lidocaine and 1 ml depo

## 2016-07-02 NOTE — Telephone Encounter (Signed)
Pt scheduled for EEG tomorrow at 2. Called and left VM mssg. Asked that pt call back to confirm appt.

## 2016-07-03 ENCOUNTER — Other Ambulatory Visit: Payer: Self-pay

## 2016-07-03 ENCOUNTER — Ambulatory Visit (INDEPENDENT_AMBULATORY_CARE_PROVIDER_SITE_OTHER): Payer: 59

## 2016-07-03 DIAGNOSIS — M791 Myalgia, unspecified site: Secondary | ICD-10-CM

## 2016-07-03 DIAGNOSIS — M7918 Myalgia, other site: Secondary | ICD-10-CM

## 2016-07-03 DIAGNOSIS — R4020 Unspecified coma: Secondary | ICD-10-CM

## 2016-07-03 DIAGNOSIS — W19XXXA Unspecified fall, initial encounter: Secondary | ICD-10-CM

## 2016-07-04 LAB — COMPREHENSIVE METABOLIC PANEL
ALT: 63 IU/L — ABNORMAL HIGH (ref 0–32)
AST: 145 IU/L — ABNORMAL HIGH (ref 0–40)
Albumin/Globulin Ratio: 1.6 (ref 1.2–2.2)
Albumin: 4 g/dL (ref 3.6–4.8)
Alkaline Phosphatase: 80 IU/L (ref 39–117)
BUN/Creatinine Ratio: 18 (ref 12–28)
BUN: 12 mg/dL (ref 8–27)
Bilirubin Total: 0.4 mg/dL (ref 0.0–1.2)
CHLORIDE: 106 mmol/L (ref 96–106)
CO2: 25 mmol/L (ref 20–29)
CREATININE: 0.65 mg/dL (ref 0.57–1.00)
Calcium: 8.8 mg/dL (ref 8.7–10.3)
GFR calc Af Amer: 109 mL/min/{1.73_m2} (ref >59)
GFR calc non Af Amer: 95 mL/min/{1.73_m2} (ref >59)
GLUCOSE: 93 mg/dL (ref 65–99)
Globulin, Total: 2.5 g/dL (ref 1.5–4.5)
Potassium: 5.3 mmol/L — ABNORMAL HIGH (ref 3.5–5.2)
Sodium: 144 mmol/L (ref 134–144)
Total Protein: 6.5 g/dL (ref 6.0–8.5)

## 2016-07-04 LAB — CBC
HEMOGLOBIN: 13.2 g/dL (ref 11.1–15.9)
Hematocrit: 39 % (ref 34.0–46.6)
MCH: 27.8 pg (ref 26.6–33.0)
MCHC: 33.8 g/dL (ref 31.5–35.7)
MCV: 82 fL (ref 79–97)
Platelets: 222 10*3/uL (ref 150–379)
RBC: 4.74 x10E6/uL (ref 3.77–5.28)
RDW: 14.6 % (ref 12.3–15.4)
WBC: 4.6 10*3/uL (ref 3.4–10.8)

## 2016-07-04 LAB — MULTIPLE MYELOMA PANEL, SERUM
ALBUMIN/GLOB SERPL: 1.2 (ref 0.7–1.7)
ALPHA 1: 0.2 g/dL (ref 0.0–0.4)
ALPHA2 GLOB SERPL ELPH-MCNC: 0.7 g/dL (ref 0.4–1.0)
Albumin SerPl Elph-Mcnc: 3.5 g/dL (ref 2.9–4.4)
B-GLOBULIN SERPL ELPH-MCNC: 1 g/dL (ref 0.7–1.3)
Gamma Glob SerPl Elph-Mcnc: 1.1 g/dL (ref 0.4–1.8)
Globulin, Total: 3 g/dL (ref 2.2–3.9)
IGA/IMMUNOGLOBULIN A, SERUM: 186 mg/dL (ref 87–352)
IGM (IMMUNOGLOBULIN M), SRM: 109 mg/dL (ref 26–217)
IgG (Immunoglobin G), Serum: 1028 mg/dL (ref 700–1600)

## 2016-07-04 LAB — SEDIMENTATION RATE: Sed Rate: 2 mm/hr (ref 0–40)

## 2016-07-04 LAB — LACTIC ACID, PLASMA: LACTATE: 10.8 mg/dL (ref 4.8–25.7)

## 2016-07-04 LAB — TSH: TSH: 2.21 u[IU]/mL (ref 0.450–4.500)

## 2016-07-04 LAB — MAGNESIUM: MAGNESIUM: 1.7 mg/dL (ref 1.6–2.3)

## 2016-07-04 LAB — CK: Total CK: 3160 U/L (ref 24–173)

## 2016-07-04 LAB — C-REACTIVE PROTEIN: CRP: 2.3 mg/L (ref 0.0–4.9)

## 2016-07-07 ENCOUNTER — Encounter: Payer: Self-pay | Admitting: Neurology

## 2016-07-07 ENCOUNTER — Other Ambulatory Visit (INDEPENDENT_AMBULATORY_CARE_PROVIDER_SITE_OTHER): Payer: Self-pay

## 2016-07-07 ENCOUNTER — Other Ambulatory Visit: Payer: Self-pay

## 2016-07-07 DIAGNOSIS — Z0289 Encounter for other administrative examinations: Secondary | ICD-10-CM

## 2016-07-07 DIAGNOSIS — R899 Unspecified abnormal finding in specimens from other organs, systems and tissues: Secondary | ICD-10-CM | POA: Diagnosis not present

## 2016-07-07 NOTE — Procedures (Signed)
   [facility]  EEG (ELECTROENCEPHALOGRAM) REPORT   STUDY DATE: 07/03/16 PATIENT NAME: Denise Rice DOB: 1951-01-02 MRN: [id number]  ORDERING CLINICIAN: Sarina Ill, MD   TECHNOLOGIST: Oneita Jolly TECHNIQUE: Electroencephalogram was recorded utilizing standard 10-20 system of lead placement and reformatted into average and bipolar montages.  RECORDING TIME: 23 minutes ACTIVATION: hyperventilation and photic stimulation  CLINICAL INFORMATION: 66 year old female with loss of consciousness.  FINDINGS: Background rhythms of 9-10 hertz and 50-60 microvolts. No focal, lateralizing, epileptiform activity or seizures are seen. Patient recorded in the awake state. EKG channel shows regular rhythm of 55-60 beats per minute with possible occasional PACs.    IMPRESSION:   Normal EEG in the awake state.   Single EKG channel shows mild sinus bradycardia with possible occasional PACs. Correlate clinically.      INTERPRETING PHYSICIAN:  Penni Bombard, MD Certified in Neurology, Neurophysiology and Neuroimaging  [facility] [address] [place], [place] [address] [phone]

## 2016-07-08 ENCOUNTER — Encounter: Payer: Self-pay | Admitting: Neurology

## 2016-07-08 LAB — COMPREHENSIVE METABOLIC PANEL
A/G RATIO: 1.5 (ref 1.2–2.2)
ALK PHOS: 81 IU/L (ref 39–117)
ALT: 34 IU/L — AB (ref 0–32)
AST: 23 IU/L (ref 0–40)
Albumin: 4 g/dL (ref 3.6–4.8)
BILIRUBIN TOTAL: 0.5 mg/dL (ref 0.0–1.2)
BUN/Creatinine Ratio: 14 (ref 12–28)
BUN: 9 mg/dL (ref 8–27)
CALCIUM: 8.6 mg/dL — AB (ref 8.7–10.3)
CHLORIDE: 101 mmol/L (ref 96–106)
CO2: 27 mmol/L (ref 20–29)
Creatinine, Ser: 0.65 mg/dL (ref 0.57–1.00)
GFR calc Af Amer: 109 mL/min/{1.73_m2} (ref >59)
GFR calc non Af Amer: 95 mL/min/{1.73_m2} (ref >59)
Globulin, Total: 2.7 g/dL (ref 1.5–4.5)
Glucose: 97 mg/dL (ref 65–99)
POTASSIUM: 4.5 mmol/L (ref 3.5–5.2)
Sodium: 142 mmol/L (ref 134–144)
Total Protein: 6.7 g/dL (ref 6.0–8.5)

## 2016-07-08 LAB — CBC
Hematocrit: 37.2 % (ref 34.0–46.6)
Hemoglobin: 12.6 g/dL (ref 11.1–15.9)
MCH: 27.2 pg (ref 26.6–33.0)
MCHC: 33.9 g/dL (ref 31.5–35.7)
MCV: 80 fL (ref 79–97)
Platelets: 206 10*3/uL (ref 150–379)
RBC: 4.63 x10E6/uL (ref 3.77–5.28)
RDW: 14.5 % (ref 12.3–15.4)
WBC: 5.5 10*3/uL (ref 3.4–10.8)

## 2016-07-08 LAB — CK: CK TOTAL: 88 U/L (ref 24–173)

## 2016-07-21 MED FILL — ALPRAZolam 1 MG TABS: 1 | 30 days supply | Qty: 90 | Fill #5

## 2016-07-22 MED FILL — ESCITALOPRAM 20 MG TABLET: 20 | 30 days supply | Qty: 30 | Fill #0

## 2016-07-23 MED FILL — ZOLPIDEM TARTRATE 10 MG TAB: 10 | 30 days supply | Qty: 30 | Fill #5

## 2016-07-30 MED FILL — traMADol HCL 50 MG TABS: 50 | 15 days supply | Qty: 60 | Fill #1

## 2016-08-05 DIAGNOSIS — Z0000 Encounter for general adult medical examination without abnormal findings: Secondary | ICD-10-CM | POA: Diagnosis not present

## 2016-08-07 MED FILL — FLUOCINONIDE 0.05% GEL: 0.05 | 10 days supply | Qty: 30 | Fill #0

## 2016-08-12 DIAGNOSIS — Z1211 Encounter for screening for malignant neoplasm of colon: Secondary | ICD-10-CM | POA: Diagnosis not present

## 2016-08-12 DIAGNOSIS — F411 Generalized anxiety disorder: Secondary | ICD-10-CM | POA: Diagnosis not present

## 2016-08-12 DIAGNOSIS — Z0000 Encounter for general adult medical examination without abnormal findings: Secondary | ICD-10-CM | POA: Diagnosis not present

## 2016-08-12 DIAGNOSIS — Z23 Encounter for immunization: Secondary | ICD-10-CM | POA: Diagnosis not present

## 2016-08-20 MED FILL — ZOLPIDEM TARTRATE 10 MG TAB: 10 | 30 days supply | Qty: 30 | Fill #0

## 2016-08-20 MED FILL — ALPRAZolam 1 MG TABS: 1 | 30 days supply | Qty: 90 | Fill #0

## 2016-08-20 MED FILL — ESCITALOPRAM 20 MG TABLET: 20 | 90 days supply | Qty: 90 | Fill #0

## 2016-08-25 MED FILL — traMADol HCL 50 MG TABS: 50 | 15 days supply | Qty: 60 | Fill #2

## 2016-09-09 DIAGNOSIS — I951 Orthostatic hypotension: Secondary | ICD-10-CM | POA: Diagnosis not present

## 2016-09-09 DIAGNOSIS — R296 Repeated falls: Secondary | ICD-10-CM | POA: Diagnosis not present

## 2016-09-09 DIAGNOSIS — R739 Hyperglycemia, unspecified: Secondary | ICD-10-CM | POA: Diagnosis not present

## 2016-09-09 DIAGNOSIS — Z6828 Body mass index (BMI) 28.0-28.9, adult: Secondary | ICD-10-CM | POA: Diagnosis not present

## 2016-09-09 DIAGNOSIS — Z131 Encounter for screening for diabetes mellitus: Secondary | ICD-10-CM | POA: Diagnosis not present

## 2016-09-11 ENCOUNTER — Ambulatory Visit: Payer: 59 | Admitting: Obstetrics & Gynecology

## 2016-09-12 ENCOUNTER — Encounter: Payer: Self-pay | Admitting: Obstetrics & Gynecology

## 2016-09-12 ENCOUNTER — Other Ambulatory Visit (HOSPITAL_COMMUNITY)
Admission: RE | Admit: 2016-09-12 | Discharge: 2016-09-12 | Disposition: A | Discharge status: Home / Self Care | Payer: 59 | Source: Ambulatory Visit | Attending: Obstetrics & Gynecology | Admitting: Obstetrics & Gynecology

## 2016-09-12 ENCOUNTER — Ambulatory Visit (INDEPENDENT_AMBULATORY_CARE_PROVIDER_SITE_OTHER): Payer: 59 | Admitting: Obstetrics & Gynecology

## 2016-09-12 ENCOUNTER — Encounter: Payer: Self-pay | Admitting: Neurology

## 2016-09-12 VITALS — BP 118/78 | HR 78 | Ht 61.5 in | Wt 156.0 lb

## 2016-09-12 DIAGNOSIS — Z01419 Encounter for gynecological examination (general) (routine) without abnormal findings: Secondary | ICD-10-CM | POA: Diagnosis not present

## 2016-09-12 DIAGNOSIS — Z124 Encounter for screening for malignant neoplasm of cervix: Secondary | ICD-10-CM

## 2016-09-12 DIAGNOSIS — Z01411 Encounter for gynecological examination (general) (routine) with abnormal findings: Secondary | ICD-10-CM | POA: Insufficient documentation

## 2016-09-12 NOTE — Progress Notes (Signed)
66 y.o. G0P0 MarriedCaucasianF here for annual exam.  Doing well. No vaginal bleeding.  Having a good year.  No recent surgeries, ER visits, urgent care.  PCP:  Dr. Ernie Hew.  Had blood work on July.    Patient's last menstrual period was 01/13/2005.          Sexually active: Yes.    The current method of family planning is status post hysterectomy.    Exercising: Yes.     Smoker:  no  Health Maintenance: Pap:  2007-neg, h/o hysterectomy History of abnormal Pap:  Yes, remote hx MMG:  06/20/16 Colonoscopy:  10/12/15 BMD:   Many years ago TDaP:  08/10/16 Pneumonia vaccine(s):  Not started Shingrix:  Pt believes done with Dr. Ernie Hew Hep C testing: 09/07/15 Screening Labs: Dr. Ernie Hew, Hb today: not obtained today, Urine pending from today   reports that she has never smoked. She has never used smokeless tobacco. She reports that she does not drink alcohol or use drugs.  Past Medical History:  Diagnosis Date  . Anal condyloma   . Deafness in left ear   . Hearing loss, sensorineural, high frequency    RIGHT EAR  . History of peptic ulcer   . Otosclerosis of left ear   . PCO (polycystic ovaries)   . PONV (postoperative nausea and vomiting)   . 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 Wears hearing aid    both ears    Past Surgical History:  Procedure Laterality Date  . BLEPHAROPLASTY  03-05-2000  . CRANIOTOMY  08/2014   to repair dehescence in right ear  . HAMMER TOE SURGERY Left 08-29-2013   2nd toe  . IMPLANTATION BONE ANCHORED HEARING AID Left 07/27/2008   left temporal bone;now removed  . LAPAROSCOPIC CHOLECYSTECTOMY  03-31-2005  . LAPAROSCOPIC GASTRIC BANDING  02-08-2007  . LASER ABLATION CONDOLAMATA N/A 07/30/2012   Procedure: LASER ABLATION CONDOLAMATA;  Surgeon: Leighton Ruff, MD;  Location: [facility];  Service: General;  Laterality: N/A;  . LASIK Bilateral 2002  . ROUX-EN-Y GASTRIC BYPASS  AUG 2013  . [place];  1994 x2;   02-03-1998  . STRABISMUS SURGERY Right   . VAGINAL HYSTERECTOMY  2007   fibroids    Current Outpatient Prescriptions  Medication Sig Dispense Refill  . ALPRAZolam (XANAX) 1 MG tablet TAKE 1 TABLET BY MOUTH 3 TIMES DAILY AS NEEDED FOR ANXIETY 90 tablet 4  . Biotin 5000 MCG CAPS Take by mouth daily.    . Calcium Carbonate-Vitamin D (CALCIUM 600 + D PO) Take 1 tablet by mouth daily.     . cyanocobalamin 2000 MCG tablet Take 2,000 mcg by mouth daily.    Marland Kitchen escitalopram (LEXAPRO) 20 MG tablet Take 1 tablet by mouth daily.  3  . ferrous sulfate 325 (65 FE) MG tablet Take 325 mg by mouth daily with breakfast.    . ibuprofen (ADVIL,MOTRIN) 600 MG tablet Take 1 tablet (600 mg total) by mouth every 6 (six) hours as needed. 30 tablet 0  . Multiple Vitamins-Minerals (MULTIVITAMIN ADULT PO) Take 1 tablet by mouth.    . traMADol (ULTRAM) 50 MG tablet Take 1 tablet (50 mg total) by mouth every 6 (six) hours as needed. 60 tablet 2  . zolpidem (AMBIEN) 10 MG tablet TAKE 1 TABLET BY MOUTH AT BEDTIME AS NEEDED 30 tablet 2   Current Facility-Administered Medications  Medication Dose Route Frequency Provider Last Rate Last Dose  . 0.9 %  sodium  chloride infusion  500 mL Intravenous Continuous Doran Stabler, MD        Family History  Problem Relation Age of Onset  . Heart attack Mother   . Heart disease Mother   . Cancer Father        unknown primary  . Stroke Sister   . Diabetes Sister   . Diabetes Maternal Aunt   . Heart disease Maternal Aunt   . Cancer Paternal Aunt   . Cancer Paternal Uncle   . Colon cancer Neg Hx   . Colon polyps Neg Hx     ROS:  Pertinent items are noted in HPI.  Otherwise, a comprehensive ROS was negative.  Exam:   Vitals reviewed. General appearance: alert, cooperative and appears stated age Head: Normocephalic, without obvious abnormality, atraumatic Neck: no adenopathy, supple, symmetrical, trachea midline and thyroid normal to inspection and palpation Lungs:  clear to auscultation bilaterally Breasts: normal appearance, no masses or tenderness Heart: regular rate and rhythm Abdomen: soft, non-tender; bowel sounds normal; no masses,  no organomegaly Extremities: extremities normal, atraumatic, no cyanosis or edema Skin: Skin color, texture, turgor normal. No rashes or lesions Lymph nodes: Cervical, supraclavicular, and axillary nodes normal. No abnormal inguinal nodes palpated Neurologic: Grossly normal   Pelvic: External genitalia:  no lesions              Urethra:  normal appearing urethra with no masses, tenderness or lesions              Bartholins and Skenes: normal                 Vagina: normal appearing vagina with normal color and discharge, no lesions              Cervix: absent              Pap taken: Yes.   Bimanual Exam:  Uterus:  uterus absent              Adnexa: no mass, fullness, tenderness               Rectovaginal: Confirms               Anus:  normal sphincter tone, no lesions  Chaperone was present for exam.  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 Ival Bible office return annually or prn

## 2016-09-16 ENCOUNTER — Other Ambulatory Visit: Payer: Self-pay | Admitting: Neurology

## 2016-09-16 DIAGNOSIS — M791 Myalgia, unspecified site: Secondary | ICD-10-CM

## 2016-09-16 DIAGNOSIS — W19XXXA Unspecified fall, initial encounter: Secondary | ICD-10-CM

## 2016-09-16 DIAGNOSIS — M7918 Myalgia, other site: Secondary | ICD-10-CM

## 2016-09-16 MED ORDER — TRAMADOL HCL 50 MG PO TABS: 50.0000 mg | ORAL_TABLET | Freq: Four times a day (QID) | ORAL | 2 refills | Status: DC | PRN

## 2016-09-16 MED FILL — traMADol HCL 50 MG TABS: 50 | 15 days supply | Qty: 60 | Fill #0

## 2016-09-16 MED FILL — ALPRAZolam 1 MG TABS: 1 | 30 days supply | Qty: 90 | Fill #1

## 2016-09-16 MED FILL — ZOLPIDEM TARTRATE 10 MG TAB: 10 | 30 days supply | Qty: 30 | Fill #1

## 2016-09-17 LAB — CYTOLOGY - PAP

## 2016-09-26 ENCOUNTER — Encounter: Payer: Self-pay | Admitting: Obstetrics & Gynecology

## 2016-10-12 MED FILL — traMADol HCL 50 MG TABS: 50 | 15 days supply | Qty: 60 | Fill #1

## 2016-10-14 ENCOUNTER — Encounter: Payer: Self-pay | Admitting: Neurology

## 2016-10-14 ENCOUNTER — Telehealth: Payer: Self-pay | Admitting: Neurology

## 2016-10-14 ENCOUNTER — Other Ambulatory Visit: Payer: Self-pay | Admitting: Neurology

## 2016-10-14 DIAGNOSIS — M791 Myalgia, unspecified site: Secondary | ICD-10-CM

## 2016-10-14 MED FILL — ALPRAZolam 1 MG TABS: 1 | 30 days supply | Qty: 90 | Fill #2

## 2016-10-14 MED FILL — ZOLPIDEM TARTRATE 10 MG TAB: 10 | 30 days supply | Qty: 30 | Fill #2

## 2016-10-14 NOTE — Telephone Encounter (Signed)
Let patient know I ordered it thanks

## 2016-10-14 NOTE — Telephone Encounter (Signed)
Patient called office to see if she can have her CK level checked, but would like to have labs drawn at [facility] health care. Would like to know if Dr. Jaynee Eagles would agree to put orders thru [facility] so she is able to have labs drawn today (Patient at facility now).

## 2016-10-15 ENCOUNTER — Other Ambulatory Visit (INDEPENDENT_AMBULATORY_CARE_PROVIDER_SITE_OTHER): Payer: Self-pay

## 2016-10-15 DIAGNOSIS — Z0289 Encounter for other administrative examinations: Secondary | ICD-10-CM

## 2016-10-15 DIAGNOSIS — M791 Myalgia, unspecified site: Secondary | ICD-10-CM | POA: Diagnosis not present

## 2016-10-15 NOTE — Telephone Encounter (Signed)
Ck lab was order. PT got labs this morning.

## 2016-10-16 ENCOUNTER — Telehealth: Payer: Self-pay

## 2016-10-16 LAB — CK: Total CK: 110 U/L (ref 24–173)

## 2016-10-16 NOTE — Telephone Encounter (Signed)
I called patient to make her aware of normal CK results. Patient had already seen her results on MyChart. She voiced understanding.

## 2016-10-16 NOTE — Telephone Encounter (Signed)
-----   Message from Lester McClain, RN sent at 10/16/2016  7:53 AM EDT -----   ----- Message ----- From: Melvenia Beam, MD Sent: 10/16/2016   6:59 AM To: Lester [place], RN  Would you call patient and let her know that her CK is normal please, thanks

## 2016-11-09 MED FILL — traMADol HCL 50 MG TABS: 50 | 15 days supply | Qty: 60 | Fill #2

## 2016-11-11 MED FILL — ALPRAZolam 1 MG TABS: 1 | 30 days supply | Qty: 90 | Fill #3

## 2016-11-11 MED FILL — ZOLPIDEM TARTRATE 10 MG TAB: 10 | 30 days supply | Qty: 30 | Fill #3

## 2016-12-08 ENCOUNTER — Other Ambulatory Visit: Payer: Self-pay | Admitting: Neurology

## 2016-12-08 DIAGNOSIS — M7918 Myalgia, other site: Secondary | ICD-10-CM

## 2016-12-08 DIAGNOSIS — M791 Myalgia, unspecified site: Secondary | ICD-10-CM

## 2016-12-08 DIAGNOSIS — W19XXXA Unspecified fall, initial encounter: Secondary | ICD-10-CM

## 2016-12-08 NOTE — Telephone Encounter (Signed)
Could you check with Dr. Jaynee Eagles, is she going to continue to fill Tramadol for patient?

## 2016-12-09 ENCOUNTER — Telehealth: Payer: Self-pay | Admitting: *Deleted

## 2016-12-09 MED FILL — traMADol HCL 50 MG TABS: 50 | 15 days supply | Qty: 60 | Fill #0

## 2016-12-09 MED FILL — ZOLPIDEM TARTRATE 10 MG TAB: 10 | 30 days supply | Qty: 30 | Fill #4

## 2016-12-09 NOTE — Telephone Encounter (Signed)
Faxed Tramadol prescription to [facility] outpatient pharmacy. Received a receipt of confirmation.

## 2016-12-10 MED FILL — ALPRAZolam 1 MG TABS: 1 | 30 days supply | Qty: 90 | Fill #4

## 2016-12-22 ENCOUNTER — Encounter: Payer: Self-pay | Admitting: Neurology

## 2016-12-24 ENCOUNTER — Other Ambulatory Visit: Payer: Self-pay | Admitting: Neurology

## 2016-12-24 DIAGNOSIS — M7918 Myalgia, other site: Secondary | ICD-10-CM

## 2016-12-24 DIAGNOSIS — W19XXXA Unspecified fall, initial encounter: Secondary | ICD-10-CM

## 2016-12-24 DIAGNOSIS — M791 Myalgia, unspecified site: Secondary | ICD-10-CM

## 2016-12-24 MED ORDER — TRAMADOL HCL 50 MG PO TABS: 50.0000 mg | ORAL_TABLET | Freq: Four times a day (QID) | ORAL | 5 refills | Status: DC | PRN

## 2016-12-24 MED ORDER — TOPIRAMATE 25 MG PO TABS: 25.0000 mg | ORAL_TABLET | Freq: Two times a day (BID) | ORAL | 6 refills | Status: DC

## 2016-12-24 MED FILL — TOPIRAMATE 25 MG TAB: 25 | 60 days supply | Qty: 120 | Fill #0

## 2016-12-24 MED FILL — traMADol HCL 50 MG TABS: 50 | 15 days supply | Qty: 60 | Fill #0

## 2016-12-24 NOTE — Telephone Encounter (Signed)
Faxed Tramadol prescription to pharmacy. Received a receipt of confirmation.

## 2016-12-25 MED FILL — ALPRAZolam 1 MG TABS: 1 | 30 days supply | Qty: 90 | Fill #5

## 2016-12-25 MED FILL — ZOLPIDEM TARTRATE 10 MG TAB: 10 | 30 days supply | Qty: 30 | Fill #5

## 2017-01-18 MED FILL — traMADol HCL 50 MG TABS: 50 | 15 days supply | Qty: 60 | Fill #1

## 2017-01-20 ENCOUNTER — Telehealth: Payer: Self-pay | Admitting: Neurology

## 2017-01-20 NOTE — Telephone Encounter (Signed)
Patient called, can you give her a call back? thanks

## 2017-01-20 NOTE — Telephone Encounter (Signed)
Pt is wanting a call back on her cell phone

## 2017-01-20 NOTE — Telephone Encounter (Signed)
Called and spoke with patient. She states that about 12/25/16 she received an early one month refill of Xanax from her PCP since she was going to [place]. She requested another refill but was not authorized as the 6 month rule they follow will end on the 02/11/17 and they will not refill it yet. She is requesting a refill from Dr. Jaynee Eagles. She also would like to see if Dr. Jaynee Eagles will refill her Ambien (previously prescribed by OBGYN). She states she is in the process of switching PCPs to Dr. Billey Chang with [facility].

## 2017-01-21 ENCOUNTER — Other Ambulatory Visit: Payer: Self-pay | Admitting: Neurology

## 2017-01-21 DIAGNOSIS — F5101 Primary insomnia: Secondary | ICD-10-CM

## 2017-01-21 DIAGNOSIS — R42 Dizziness and giddiness: Secondary | ICD-10-CM

## 2017-01-21 MED FILL — ALPRAZolam 1 MG TABS: 1 | 30 days supply | Qty: 90 | Fill #0

## 2017-01-21 MED FILL — ZOLPIDEM TARTRATE 10 MG TAB: 10 | 30 days supply | Qty: 30 | Fill #0

## 2017-01-21 NOTE — Telephone Encounter (Signed)
Dr. Jaynee Eagles aware that the pt got refills from PCP. Meds NOT refilled by Dr. Jaynee Eagles.

## 2017-01-21 NOTE — Telephone Encounter (Signed)
Pt called to inform us that her PCP approved refills

## 2017-01-21 NOTE — Telephone Encounter (Signed)
I am going to refill these meds for 4 months. Patient has to transition these to pcp, I don't prescribe these long term as they have been associated with parkinsin's disease or dementia. I let patient know. Also told patient taking this many sedating drugs makes her at risk for falls and respiratory depression which can cause death. Since she has been on these for a while I will continue but she needs a new pcp, can;t give anymore refills after this  thanks

## 2017-02-09 DIAGNOSIS — E079 Disorder of thyroid, unspecified: Secondary | ICD-10-CM | POA: Diagnosis not present

## 2017-02-09 DIAGNOSIS — E559 Vitamin D deficiency, unspecified: Secondary | ICD-10-CM | POA: Diagnosis not present

## 2017-02-09 DIAGNOSIS — E782 Mixed hyperlipidemia: Secondary | ICD-10-CM | POA: Diagnosis not present

## 2017-02-11 DIAGNOSIS — F411 Generalized anxiety disorder: Secondary | ICD-10-CM | POA: Diagnosis not present

## 2017-02-11 DIAGNOSIS — E559 Vitamin D deficiency, unspecified: Secondary | ICD-10-CM | POA: Diagnosis not present

## 2017-02-11 DIAGNOSIS — E039 Hypothyroidism, unspecified: Secondary | ICD-10-CM | POA: Diagnosis not present

## 2017-02-11 DIAGNOSIS — E782 Mixed hyperlipidemia: Secondary | ICD-10-CM | POA: Diagnosis not present

## 2017-02-16 MED FILL — ESCITALOPRAM 20 MG TABLET: 20 | 90 days supply | Qty: 90 | Fill #1

## 2017-02-16 MED FILL — TOPIRAMATE 25 MG TABLET: 25 | 60 days supply | Qty: 120 | Fill #1

## 2017-02-18 MED FILL — ZOLPIDEM TARTRATE 10 MG TAB: 10 | 30 days supply | Qty: 30 | Fill #0

## 2017-02-18 MED FILL — ALPRAZolam 1 MG TABS: 1 | 30 days supply | Qty: 90 | Fill #0

## 2017-03-03 ENCOUNTER — Telehealth: Payer: 59 | Admitting: Family

## 2017-03-03 DIAGNOSIS — R05 Cough: Secondary | ICD-10-CM

## 2017-03-03 DIAGNOSIS — R059 Cough, unspecified: Secondary | ICD-10-CM

## 2017-03-03 MED ORDER — PREDNISONE 10 MG (21) PO TBPK: ORAL_TABLET | ORAL | 0 refills | Status: DC

## 2017-03-03 NOTE — Progress Notes (Signed)

## 2017-03-04 ENCOUNTER — Ambulatory Visit: Payer: Self-pay | Admitting: Emergency Medicine

## 2017-03-04 VITALS — BP 128/82 | HR 81 | Temp 98.1°F | Resp 17

## 2017-03-04 DIAGNOSIS — B9789 Other viral agents as the cause of diseases classified elsewhere: Secondary | ICD-10-CM

## 2017-03-04 DIAGNOSIS — J069 Acute upper respiratory infection, unspecified: Secondary | ICD-10-CM

## 2017-03-04 MED ORDER — HYDROCODONE-HOMATROPINE 5-1.5 MG/5ML PO SYRP: 5.0000 mL | ORAL_SOLUTION | Freq: Four times a day (QID) | ORAL | 0 refills | Status: DC | PRN

## 2017-03-04 MED ORDER — BENZONATATE 100 MG PO CAPS: 100.0000 mg | ORAL_CAPSULE | Freq: Three times a day (TID) | ORAL | 0 refills | Status: DC | PRN

## 2017-03-04 MED FILL — HYDROCODONE-HOMATROPINE SOL: 5-1.5 | 6 days supply | Qty: 120 | Fill #0

## 2017-03-04 MED FILL — BENZONATATE 100 MG CAP: 100 | 10 days supply | Qty: 30 | Fill #0

## 2017-03-04 NOTE — Progress Notes (Signed)
Subjective:     KANITRA PURIFOY is a 67 y.o. female who presents for evaluation of chills, nasal congestion, nonproductive cough, sneezing and sore throat. Symptoms began 5 days ago. Symptoms have been unchanged since that time. No history of asthma or chronic lung disease  The following portions of the patient's history were reviewed and updated as appropriate: allergies and current medications.  Review of Systems Pertinent items noted in HPI and remainder of comprehensive ROS otherwise negative.    Objective:    BP 128/82 (BP Location: Right Arm, Patient Position: Sitting, Cuff Size: Normal)   Pulse 81   Temp 98.1 F (36.7 C) (Oral)   Resp 17   LMP 01/13/2005   SpO2 98%  General appearance: alert, cooperative and appears stated age Head: Normocephalic, without obvious abnormality, atraumatic Ears: normal TM's and external ear canals both ears Nose: Nares normal. Septum midline. Mucosa normal. No drainage or sinus tenderness. Throat: lips, mucosa, and tongue normal; teeth and gums normal Lungs: clear to auscultation bilaterally Heart: regular rate and rhythm Extremities: extremities normal, atraumatic, no cyanosis or edema Pulses: 2+ and symmetric    Assessment:    URI with Post Nasal Drip    Plan:    Worsening signs and symptoms discussed. Rest, fluids, acetaminophen, and humidification. Follow up as needed for persistent, worsening cough, or appearance of new symptoms.

## 2017-03-04 NOTE — Patient Instructions (Signed)

## 2017-03-06 ENCOUNTER — Telehealth: Payer: Self-pay | Admitting: Emergency Medicine

## 2017-03-06 NOTE — Telephone Encounter (Signed)
Called patient to follow up with them since their visit with [facility], left vm

## 2017-03-08 ENCOUNTER — Other Ambulatory Visit: Payer: Self-pay | Admitting: Obstetrics & Gynecology

## 2017-03-08 MED ORDER — NITROFURANTOIN MONOHYD MACRO 100 MG PO CAPS: 100.0000 mg | ORAL_CAPSULE | Freq: Two times a day (BID) | ORAL | 0 refills | Status: DC

## 2017-03-09 MED FILL — NITROFURANTOIN MONO-MCR 100: 100 | 5 days supply | Qty: 10 | Fill #0

## 2017-03-10 ENCOUNTER — Encounter: Payer: Self-pay | Admitting: Family Medicine

## 2017-03-10 NOTE — Telephone Encounter (Signed)
Dr. Jonni Sanger,   See My Chart note. Has appt tomorrow with you

## 2017-03-11 ENCOUNTER — Encounter: Payer: Self-pay | Admitting: Family Medicine

## 2017-03-11 ENCOUNTER — Ambulatory Visit (INDEPENDENT_AMBULATORY_CARE_PROVIDER_SITE_OTHER): Payer: 59 | Admitting: Family Medicine

## 2017-03-11 ENCOUNTER — Other Ambulatory Visit: Payer: Self-pay

## 2017-03-11 VITALS — BP 118/80 | HR 74 | Temp 98.7°F | Ht 60.25 in | Wt 161.8 lb

## 2017-03-11 DIAGNOSIS — E282 Polycystic ovarian syndrome: Secondary | ICD-10-CM | POA: Diagnosis not present

## 2017-03-11 DIAGNOSIS — H903 Sensorineural hearing loss, bilateral: Secondary | ICD-10-CM | POA: Diagnosis not present

## 2017-03-11 DIAGNOSIS — J209 Acute bronchitis, unspecified: Secondary | ICD-10-CM

## 2017-03-11 DIAGNOSIS — F341 Dysthymic disorder: Secondary | ICD-10-CM

## 2017-03-11 DIAGNOSIS — R42 Dizziness and giddiness: Secondary | ICD-10-CM | POA: Diagnosis not present

## 2017-03-11 DIAGNOSIS — F329 Major depressive disorder, single episode, unspecified: Secondary | ICD-10-CM

## 2017-03-11 HISTORY — DX: Polycystic ovarian syndrome: E28.2

## 2017-03-11 MED ORDER — AZITHROMYCIN 250 MG PO TABS: ORAL_TABLET | ORAL | 0 refills | Status: DC

## 2017-03-11 MED ORDER — FLUCONAZOLE 150 MG PO TABS
ORAL_TABLET | ORAL | 0 refills | Status: DC
Start: 2017-03-11 — End: 2017-03-19

## 2017-03-11 MED ORDER — BUDESONIDE-FORMOTEROL FUMARATE 80-4.5 MCG/ACT IN AERO: 2.0000 | INHALATION_SPRAY | Freq: Two times a day (BID) | RESPIRATORY_TRACT | 0 refills | Status: DC

## 2017-03-11 MED FILL — AZITHROMYCIN 250 MG TAB: 250 | 5 days supply | Qty: 6 | Fill #0

## 2017-03-11 MED FILL — SYMBICORT 80-4.5 MCG INH: 80-4.5 | 30 days supply | Qty: 10 | Fill #0

## 2017-03-11 MED FILL — FLUCONAZOLE 150 MG TABLET: 150 | 3 days supply | Qty: 2 | Fill #0

## 2017-03-11 NOTE — Patient Instructions (Signed)
Please return in 6 months for your annual complete physical; please come fasting.   It was a pleasure meeting you today! Thank you for choosing [place] to meet your healthcare needs! I truly look forward to working with you. If you have any questions or concerns, please send me a message via Mychart or call the office at [phone].  Delsym for cough.  Complete your antibiotics.   Use the inhaler.  How to Use a Metered Dose Inhaler A metered dose inhaler is a handheld device for taking medicine that must be breathed into the lungs (inhaled). The device can be used to deliver a variety of inhaled medicines, including:  Quick relief or rescue medicines, such as bronchodilators.  Controller medicines, such as corticosteroids.  The medicine is delivered by pushing down on a metal canister to release a preset amount of spray and medicine. Each device contains the amount of medicine that is needed for a preset number of uses (inhalations). Your health care provider may recommend that you use a spacer with your inhaler to help you take the medicine more effectively. A spacer is a plastic tube with a mouthpiece on one end and an opening that connects to the inhaler on the other end. A spacer holds the medicine in a tube for a short time, which allows you to inhale more medicine. What are the risks? If you do not use your inhaler correctly, medicine might not reach your lungs to help you breathe. Inhaler medicine can cause side effects, such as:  Mouth or throat infection.  Cough.  Hoarseness.  Headache.  Nausea and vomiting.  Lung infection (pneumonia) in people who have a lung condition called COPD.  How to use a metered dose inhaler without a spacer 1. Remove the cap from the inhaler. 2. If you are using the inhaler for the first time, shake it for 5 seconds, turn it away from your face, then release 4 puffs into the air. This is called priming. 3. Shake the inhaler for 5  seconds. 4. Position the inhaler so the top of the canister faces up. 5. Put your index finger on the top of the medicine canister. Support the bottom of the inhaler with your thumb. 6. Breathe out normally and as completely as possible, away from the inhaler. 7. Either place the inhaler between your teeth and close your lips tightly around the mouthpiece, or hold the inhaler 1-2 inches (2.5-5 cm) away from your open mouth. Keep your tongue down out of the way. If you are unsure which technique to use, ask your health care provider. 8. Press the canister down with your index finger to release the medicine, then inhale deeply and slowly through your mouth (not your nose) until your lungs are completely filled. Inhaling should take 4-6 seconds. 9. Hold the medicine in your lungs for 5-10 seconds (10 seconds is best). This helps the medicine get into the small airways of your lungs. 10. With your lips in a tight circle (pursed), breathe out slowly. 11. Repeat steps 3-10 until you have taken the number of puffs that your health care provider directed. Wait about 1 minute between puffs or as directed. 12. Put the cap on the inhaler. 13. If you are using a steroid inhaler, rinse your mouth with water, gargle, and spit out the water. Do not swallow the water. How to use a metered dose inhaler with a spacer 1. Remove the cap from the inhaler. 2. If you are using the inhaler for  the first time, shake it for 5 seconds, turn it away from your face, then release 4 puffs into the air. This is called priming. 3. Shake the inhaler for 5 seconds. 4. Place the open end of the spacer onto the inhaler mouthpiece. 5. Position the inhaler so the top of the canister faces up and the spacer mouthpiece faces you. 6. Put your index finger on the top of the medicine canister. Support the bottom of the inhaler and the spacer with your thumb. 7. Breathe out normally and as completely as possible, away from the  spacer. 8. Place the spacer between your teeth and close your lips tightly around it. Keep your tongue down out of the way. 9. Press the canister down with your index finger to release the medicine, then inhale deeply and slowly through your mouth (not your nose) until your lungs are completely filled. Inhaling should take 4-6 seconds. 10. Hold the medicine in your lungs for 5-10 seconds (10 seconds is best). This helps the medicine get into the small airways of your lungs. 11. With your lips in a tight circle (pursed), breathe out slowly. 12. Repeat steps 3-11 until you have taken the number of puffs that your health care provider directed. Wait about 1 minute between puffs or as directed. 13. Remove the spacer from the inhaler and put the cap on the inhaler. 14. If you are using a steroid inhaler, rinse your mouth with water, gargle, and spit out the water. Do not swallow the water. Follow these instructions at home:  Take your inhaled medicine only as told by your health care provider. Do not use the inhaler more than directed by your health care provider.  Keep all follow-up visits as told by your health care provider. This is important.  If your inhaler has a counter, you can check it to determine how full your inhaler is. If your inhaler does not have a counter, ask your health care provider when you will need to refill your inhaler and write the refill date on a calendar or on your inhaler canister. Note that you cannot know when an inhaler is empty by shaking it.  Follow directions on the package insert for care and cleaning of your inhaler and spacer. Contact a health care provider if:  Symptoms are only partially relieved with your inhaler.  You are having trouble using your inhaler.  You have an increase in phlegm.  You have headaches. Get help right away if:  You feel little or no relief after using your inhaler.  You have dizziness.  You have a fast heart rate.  You  have chills or a fever.  You have night sweats.  There is blood in your phlegm. Summary  A metered dose inhaler is a handheld device for taking medicine that must be breathed into the lungs (inhaled).  The medicine is delivered by pushing down on a metal canister to release a preset amount of spray and medicine.  Each device contains the amount of medicine that is needed for a preset number of uses (inhalations). This information is not intended to replace advice given to you by your health care provider. Make sure you discuss any questions you have with your health care provider. Document Released: 12/30/2004 Document Revised: 11/20/2015 Document Reviewed: 11/20/2015 Elsevier Interactive Patient Education  2017 [employer].

## 2017-03-11 NOTE — Progress Notes (Signed)
Subjective  CC:  Chief Complaint  Patient presents with  . Establish Care    Transfer from [facility]  . Cough    Non-Productive, x 2 weeks. Patient states she feels a rumble feeling in chest    HPI: Denise Rice is a 67 y.o. female who presents to [place] [facility] today to establish care with me as a new patient.  She has the following concerns or needs:   Denise Rice is here to establish care.  We reviewed her past medical history in detail.  Overall she is doing very well.  He has a remote history of PCO S with secondary obesity that is resolved status post gastric bypass in 2014.  She does have resultant vitamin D deficiency but other vitamins and nutritional follow-up has been good.  History of chronic depression for which she takes Lexapro.  Her mood is good.  She has a high stress job.  Hearing loss: Complicated hearing loss history.  She was most recently cared for by Dr. at [facility].  She did have surgical repair and does wear hearing aids.  She does suffer from chronic vertigo secondary to her sensorineural hearing loss.  She uses Xanax as needed for that.  She does use a cane at times to ambulate.  Cough: She reports she started getting sick about 2 weeks ago with cold symptoms.  Since she has been having a hacking thick congested cough with some shortness of breath and wheezing.  She does not have a diagnosis of asthma.  She has never used an inhaler before.  She denies fevers.  No dyspnea on exertion or chest pain.  No GI symptoms.  She was treated with Robitussin-AC and Tessalon Perles by [facility] provider however her symptoms persist.  Of note she is on Macrobid for a UTI.  Health maintenance is up-to-date.  She does have a gynecologist for female wellness.  She reports her last complete physical with her prior PCP was in July.  No history of diabetes, hypertension, or hyperlipidemia.  We updated and reviewed the patient's past history in detail  and it is documented below.  Patient Active Problem List   Diagnosis Date Noted  . Bilateral sensorineural hearing loss 09/06/2014    Priority: High  . Chronic vertigo 03/11/2017    Priority: Medium  . Major depression, chronic 03/11/2017    Priority: Medium  . PCOS (polycystic ovarian syndrome) 03/11/2017    Priority: Medium  . Insomnia 09/08/2015    Priority: Medium  . 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 History of laparoscopic adjustable gastric banding 01/22/2011    Priority: Medium  . 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 Vitamin D deficiency 01/15/2008    Priority: Low   Health Maintenance  Topic Date Due  . MAMMOGRAM  06/21/2018  . PAP SMEAR  09/12/2021  . COLONOSCOPY  10/11/2025  . TETANUS/TDAP  08/13/2026  . INFLUENZA VACCINE  Completed  . Hepatitis C Screening  Completed  . HIV Screening  Completed   Immunization History  Administered Date(s) Administered  . Hepatitis A 08/01/2015, 02/06/2016  . Hepatitis B 08/01/2015, 10/03/2015, 02/06/2016  . Influenza-Unspecified 09/29/2014, 09/26/2016  . Pneumococcal Conjugate-13 08/25/2014  . Pneumococcal Polysaccharide-23 08/15/2013  . Td 02/14/1995  . Tdap 08/12/2016  . Zoster 05/19/2013   Current Meds  Medication Sig  . ALPRAZolam (XANAX) 1 MG tablet TAKE 1 TABLET BY MOUTH  3 TIMES DAILY AS NEEDED FOR ANXIETY  . benzonatate (TESSALON PERLES) 100 MG capsule Take 1 capsule (100 mg total) by mouth 3 (three) times daily as needed.  . Biotin 5000 MCG CAPS Take by mouth daily.  . Calcium Carbonate-Vitamin D (CALCIUM 600 + D PO) Take 1 tablet by mouth daily.   . cyanocobalamin 2000 MCG tablet Take 2,000 mcg by mouth daily.  Marland Kitchen escitalopram (LEXAPRO) 20 MG tablet Take 1 tablet by mouth daily.  . ferrous sulfate 325 (65 FE) MG tablet Take 325 mg by mouth daily with breakfast.  . Multiple Vitamins-Minerals (MULTIVITAMIN ADULT PO) Take 1 tablet by mouth.  .  zolpidem (AMBIEN) 10 MG tablet TAKE 1 TABLET BY MOUTH AT BEDTIME AS NEEDED    Allergies: Patient is allergic to topiramate. 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 Past Surgical History Patient  has a past surgical history that includes Vaginal hysterectomy (2007); Roux-en-Y Gastric Bypass (AUG 2013); Laparoscopic gastric banding (02-08-2007); Strabismus surgery (Right); Laparoscopic cholecystectomy (03-31-2005); Implantation bone anchored hearing aid (Left, 07/27/2008); Stapedes surgery (Left, 1985;  1994 x2;  02-03-1998); Blepharoplasty (03-05-2000); Laser ablation condolamata (N/A, 07/30/2012); Hammer toe surgery (Left, 08-29-2013); LASIK (Bilateral, 2002); and Craniotomy (08/2014). Family History: Patient family history includes Cancer in her father, paternal aunt, and paternal uncle; Diabetes in her maternal aunt and sister; Heart attack in her mother; Heart disease in her maternal aunt and mother; Stroke in her sister. Social History:  Patient  reports that  has never smoked. she has never used smokeless tobacco. She reports that she does not drink alcohol or use drugs.  Review of Systems: Constitutional: negative for fever or malaise Ophthalmic: negative for photophobia, double vision or loss of vision Cardiovascular: negative for chest pain, dyspnea on exertion, or new LE swelling Respiratory: negative for SOB or persistent cough Gastrointestinal: negative for abdominal pain, change in bowel habits or melena Genitourinary: negative for dysuria or gross hematuria Musculoskeletal:  negative for new gait disturbance or muscular weakness Integumentary: negative for new or persistent rashes Neurological: negative for TIA or stroke symptoms Psychiatric: negative for SI or delusions Allergic/Immunologic: negative for hives  Patient Care Team    Relationship Specialty Notifications Start End  [name], MD PCP - General Family Medicine  03/11/17   Megan Salon, MD Consulting Physician Gynecology  03/11/17     Objective  Vitals: BP 118/80   Pulse 74   Temp 98.7 F (37.1 C)   Ht 5' 0.25" (1.53 m)   Wt 161 lb 12.8 oz (73.4 kg)   LMP 01/13/2005   BMI 31.34 kg/m  General:  Well developed, well nourished, no acute distress  Psych:  Alert and oriented,normal mood and affect HEENT:  Normocephalic, atraumatic, non-icteric sclera, PERRL, oropharynx is without mass or exudate, supple neck without adenopathy, mass or thyromegaly Cardiovascular:  RRR without gallop, rub or murmur, nondisplaced PMI Respiratory:  Good breath sounds bilaterally, CTAB with normal respiratory effort Gastrointestinal: normal bowel sounds, soft, non-tender, no noted masses. No HSM MSK: no deformities, contusions. Joints are without erythema or swelling Skin:  Warm, no rashes or suspicious lesions noted Neurologic:    Mental status is normal.  Gross motor and sensory exams are normal. Normal gait  Assessment  1. Acute bronchitis with bronchospasm   2. Chronic vertigo   3. Major depression, chronic   4. PCOS (polycystic ovarian syndrome)   5. Bilateral sensorineural hearing loss      Plan   Treat bronchitis with Z-Pak.  Supportive care.  Add Symbicort for bronchospasm and inflammation.  She declines oral prednisone at this time due to side effects.  Will monitor over the next 48-72 hours.  She will let me know if things are not improving at that time.  We did discuss how to use an MDI.  Chronic vertigo is stable.  She is a high fall risk.  Will monitor  Major depression is well  controlled.  Hearing loss per ENT.  Health maintenance will be due this summer.  She will schedule appointment at that time  Follow up: July for CPE.  Commons side effects, risks, benefits, and alternatives for medications and treatment plan prescribed today were discussed, and the patient expressed understanding of the given instructions. Patient is instructed to call or message via MyChart if he/she has any questions or concerns regarding our treatment plan. No barriers to understanding were identified. We discussed Red Flag symptoms and signs in detail. Patient expressed understanding regarding what to do in case of urgent or emergency type symptoms.   Medication list was reconciled, printed and provided to the patient in AVS. Patient instructions and summary information was reviewed with the patient as documented in the AVS. This note was prepared with assistance of Dragon voice recognition software. Occasional wrong-word or sound-a-like substitutions may have occurred due to the inherent limitations of voice recognition software  No orders of the defined types were placed in this encounter.  Meds ordered this encounter  Medications  . azithromycin (ZITHROMAX) 250 MG tablet    Sig: Take 2 tabs today, then 1 tab daily for 4 days    Dispense:  1 each    Refill:  0  . budesonide-formoterol (SYMBICORT) 80-4.5 MCG/ACT inhaler    Sig: Inhale 2 puffs into the lungs 2 (two) times daily.    Dispense:  1 Inhaler    Refill:  0  . fluconazole (DIFLUCAN) 150 MG tablet    Sig: Take one tablet today; may repeat in 3 days if symptoms persist    Dispense:  2 tablet    Refill:  0

## 2017-03-16 MED FILL — traMADol HCL 50 MG TABS: 50 | 15 days supply | Qty: 60 | Fill #2

## 2017-03-17 MED FILL — ZOLPIDEM TARTRATE 10 MG TAB: 10 | 30 days supply | Qty: 30 | Fill #1

## 2017-03-17 MED FILL — ALPRAZolam 1 MG TABS: 1 | 30 days supply | Qty: 90 | Fill #1

## 2017-03-18 ENCOUNTER — Encounter: Payer: Self-pay | Admitting: Family Medicine

## 2017-03-18 ENCOUNTER — Ambulatory Visit: Payer: Self-pay | Admitting: *Deleted

## 2017-03-18 NOTE — Telephone Encounter (Signed)
Pt states saw Dr. Jonni Sanger 03/11/17; acute bronchitis. Completed Z-pack, using inhalers.  States felt much better until last night when cough returned. Dry, non-productive, severe per patient. "Tightness in chest." States some wheezing with inspiration. Denies CP, afebrile. Pt had called [facility] earlier today and set up appt with Dr. Jonni Sanger for 0800 tomorrow. As pt states wheezing,(audible) directed her to UC, declined. Requested appt with Dr. Jonni Sanger this afternoon, unavailable. Spoke with Levada Dy, [facility] who reiterates need to go to UC if pt feels she can not wait for tomorrows appt.  Pt. made aware. States will go if symptoms worsen. Care advise given.  Reason for Disposition . Wheezing is present  Answer Assessment - Initial Assessment Questions 1. ONSET: "When did the cough begin?"     Yesterday 2. SEVERITY: "How bad is the cough today?"      "Severe" 3. RESPIRATORY DISTRESS: "Describe your breathing."      Shortness of breath, mild-moderate, wheezing at times 4. FEVER: "Do you have a fever?" If so, ask: "What is your temperature, how was it measured, and when did it start?"     No 5. HEMOPTYSIS: "Are you coughing up any blood?" If so ask: "How much?" (flecks, streaks, tablespoons, etc.)     no 6. TREATMENT: "What have you done so far to treat the cough?" (e.g., meds, fluids, humidifier)     Mucinex, delsum 7. CARDIAC HISTORY: "Do you have any history of heart disease?" (e.g., heart attack, congestive heart failure)      no 8. LUNG HISTORY: "Do you have any history of lung disease?"  (e.g., pulmonary embolus, asthma, emphysema)     no 9. PE RISK FACTORS: "Do you have a history of blood clots?" (or: recent major surgery, recent prolonged travel, bedridden )     no 10. OTHER SYMPTOMS: "Do you have any other symptoms? (e.g., runny nose, wheezing, chest pain)       Wheezing, tightness in chest 11. PREGNANCY: "Is there any chance you are pregnant?" "When was your last menstrual period?"       no 12. TRAVEL:  "Have you traveled out of the country in the last month?" (e.g., travel history, exposures)       no  Protocols used: COUGH - ACUTE NON-PRODUCTIVE-A-AH

## 2017-03-19 ENCOUNTER — Ambulatory Visit (INDEPENDENT_AMBULATORY_CARE_PROVIDER_SITE_OTHER): Payer: 59

## 2017-03-19 ENCOUNTER — Ambulatory Visit (INDEPENDENT_AMBULATORY_CARE_PROVIDER_SITE_OTHER): Payer: 59 | Admitting: Family Medicine

## 2017-03-19 ENCOUNTER — Encounter: Payer: Self-pay | Admitting: Family Medicine

## 2017-03-19 ENCOUNTER — Other Ambulatory Visit: Payer: Self-pay

## 2017-03-19 VITALS — BP 132/88 | HR 69 | Temp 98.2°F | Resp 14 | Ht 60.25 in | Wt 163.0 lb

## 2017-03-19 DIAGNOSIS — R05 Cough: Secondary | ICD-10-CM

## 2017-03-19 DIAGNOSIS — R062 Wheezing: Secondary | ICD-10-CM | POA: Diagnosis not present

## 2017-03-19 DIAGNOSIS — R059 Cough, unspecified: Secondary | ICD-10-CM

## 2017-03-19 DIAGNOSIS — R6889 Other general symptoms and signs: Secondary | ICD-10-CM

## 2017-03-19 DIAGNOSIS — J029 Acute pharyngitis, unspecified: Secondary | ICD-10-CM | POA: Diagnosis not present

## 2017-03-19 LAB — POC INFLUENZA A&B (BINAX/QUICKVUE)
INFLUENZA A, POC: NEGATIVE
INFLUENZA B, POC: NEGATIVE

## 2017-03-19 LAB — POCT RAPID STREP A (OFFICE): Rapid Strep A Screen: NEGATIVE

## 2017-03-19 MED ORDER — OSELTAMIVIR PHOSPHATE 75 MG PO CAPS: 75.0000 mg | ORAL_CAPSULE | Freq: Two times a day (BID) | ORAL | 0 refills | Status: DC

## 2017-03-19 MED ORDER — GUAIFENESIN-CODEINE 100-10 MG/5ML PO SOLN: 5.0000 mL | Freq: Four times a day (QID) | ORAL | 0 refills | Status: DC | PRN

## 2017-03-19 MED ORDER — ALBUTEROL SULFATE HFA 108 (90 BASE) MCG/ACT IN AERS: 2.0000 | INHALATION_SPRAY | RESPIRATORY_TRACT | 2 refills | Status: DC | PRN

## 2017-03-19 MED FILL — GUAIATUSSIN AC LIQUID: 100-10 | 6 days supply | Qty: 120 | Fill #0

## 2017-03-19 MED FILL — VENTOLIN HFA 90 MCG INHALER: 108 (90 BAS | 17 days supply | Qty: 18 | Fill #0

## 2017-03-19 MED FILL — OSELTAMIVIR PHOSPHATE 75 MG: 75 | 5 days supply | Qty: 10 | Fill #0

## 2017-03-19 NOTE — Progress Notes (Signed)
Subjective   CC:  Chief Complaint  Patient presents with  . Follow-up    Tightness in chest started Tuesday, dry cough, body aches, worse now than last week    HPI: Denise Rice is a 67 y.o. female who presents to the office today to address the problems listed above in the chief complaint. pt was here last week due to persistent bronchitic sxs: treated with zpak and cough meds- did well with resolution of sxs. Traveled to [place] over weekend. Yesterday with flu like sxs; last zpak dose about 4 days ago. Was seen by doc at her office: treated with albuterol neb:   Patient complains of flu like symptoms including myalgias, NO  fever, ST, hacking dry cough and some congestion. Sxs have been present for 1 days. She has tried to alleviate the sxs with over-the-counter medicines with mild relief. No high risk factors for influenza complications or high risk household contacts are present. No SOB or CP are present. Taking in fluids adequately; decreased appetite bt no significant n/v/d. She has had the flu vaccine this season.  I reviewed the patients updated PMH, FH, and SocHx.    Patient Active Problem List   Diagnosis Date Noted  . Bilateral sensorineural hearing loss 09/06/2014    Priority: High  . Chronic vertigo 03/11/2017    Priority: Medium  . Major depression, chronic 03/11/2017    Priority: Medium  . PCOS (polycystic ovarian syndrome) 03/11/2017    Priority: Medium  . Insomnia 09/08/2015    Priority: Medium  . 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 History of laparoscopic adjustable gastric banding 01/22/2011    Priority: Medium  . 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 Vitamin D deficiency 01/15/2008    Priority: Low   Current Meds  Medication Sig  . ALPRAZolam (XANAX) 1 MG tablet TAKE 1 TABLET BY MOUTH 3 TIMES DAILY AS NEEDED FOR ANXIETY  . Biotin 5000 MCG CAPS Take by mouth daily.  . budesonide-formoterol  (SYMBICORT) 80-4.5 MCG/ACT inhaler Inhale 2 puffs into the lungs 2 (two) times daily.  . Calcium Carbonate-Vitamin D (CALCIUM 600 + D PO) Take 1 tablet by mouth daily.   . cyanocobalamin 2000 MCG tablet Take 2,000 mcg by mouth daily.  Marland Kitchen escitalopram (LEXAPRO) 20 MG tablet Take 1 tablet by mouth daily.  . ferrous sulfate 325 (65 FE) MG tablet Take 325 mg by mouth daily with breakfast.  . ibuprofen (ADVIL,MOTRIN) 600 MG tablet Take 1 tablet (600 mg total) by mouth every 6 (six) hours as needed.  . Multiple Vitamins-Minerals (MULTIVITAMIN ADULT PO) Take 1 tablet by mouth.  . topiramate (TOPAMAX) 25 MG tablet Take 1 tablet (25 mg total) by mouth 2 (two) times daily. (Patient taking differently: Take 25 mg by mouth as needed. )  . traMADol (ULTRAM) 50 MG tablet Take 1 tablet (50 mg total) by mouth every 6 (six) hours as needed.  . zolpidem (AMBIEN) 10 MG tablet TAKE 1 TABLET BY MOUTH AT BEDTIME AS NEEDED   Family History: Patient family history includes Cancer in her father, paternal aunt, and paternal uncle; Diabetes in her maternal aunt and sister; Heart attack in her mother; Heart disease in her maternal aunt and mother; Stroke in her sister. Social History:  Patient  reports that  has never smoked. she has never used smokeless tobacco. She reports that she does not drink alcohol or use drugs.  Review of Systems: Constitutional: negative for fever or malaise Ophthalmic: negative for photophobia, double vision or loss of vision Cardiovascular: negative for chest pain, dyspnea on exertion, or new LE swelling Respiratory: negative for SOB or persistent cough Gastrointestinal: negative for abdominal pain, change in bowel habits or melena Genitourinary: negative for dysuria or gross hematuria Musculoskeletal: negative for new gait disturbance or muscular weakness Integumentary: negative for new or persistent rashes Neurological: negative for TIA or stroke symptoms Psychiatric: negative for SI or  delusions Allergic/Immunologic: negative for hives  Objective  Vitals: BP 132/88   Pulse 69   Temp 98.2 F (36.8 C) (Oral)   Resp 14   Ht 5' 0.25" (1.53 m)   Wt 163 lb (73.9 kg)   LMP 01/13/2005   SpO2 97%   BMI 31.57 kg/m  General: no acute respiratory distress , non toxic appearing Psych:  Alert and oriented, normal mood and affect HEENT: Normocephalic, nasal congestion present, TMs w/o erythema, OP with erythema w/o exudate, no LAD, supple neck  Cardiovascular:  RRR without murmur or gallop. no peripheral edema Respiratory:  Good breath sounds bilaterally, right basilar rales, no wheezing or rhonchi Gastrointestinal: soft, flat abdomen, normal active bowel sounds, no palpable masses, no hepatosplenomegaly, no appreciated hernias Skin:  Warm, no rashes Neurologic:   Mental status is normal. normal gait Office Visit on 03/19/2017  Component Date Value Ref Range Status  . Influenza A, POC 03/19/2017 Negative  Negative Final  . Influenza B, POC 03/19/2017 Negative  Negative Final  . Rapid Strep A Screen 03/19/2017 Negative  Negative Final    Assessment  1. Flu-like symptoms   2. Sore throat   3. Cough      Plan   Send for cxr to ensure no pneumonia. If negative will treat for presumptive flu; cough meds and supportive care. Rest. No work for 24-48 hours at least. Rest, hydration and good nutrition recommended.   Follow up: f/u if not improving or if wheezing worsens.    Commons side effects, risks, benefits, and alternatives for medications and treatment plan prescribed today were discussed, and the patient expressed understanding of the given instructions. Patient is instructed to call or message via MyChart if he/she has any questions or concerns regarding our treatment plan. No barriers to understanding were identified. We discussed Red Flag symptoms and signs in detail. Patient expressed understanding regarding what to do in case of urgent or emergency type symptoms.    Medication list was reconciled, printed and provided to the patient in AVS. Patient instructions and summary information was reviewed with the patient as documented in the AVS. This note was prepared with assistance of Dragon voice recognition software. Occasional wrong-word or sound-a-like substitutions may have occurred due to the inherent limitations of voice recognition software  Orders Placed This Encounter  Procedures  . DG Chest 2 View  . POC Influenza A&B(BINAX/QUICKVUE)  . POCT rapid strep A   Meds ordered this encounter  Medications  . guaiFENesin-codeine 100-10 MG/5ML syrup    Sig: Take 5 mLs by mouth every 6 (six) hours as needed for cough.    Dispense:  120 mL    Refill:  0  . albuterol (PROVENTIL HFA;VENTOLIN HFA) 108 (90 Base) MCG/ACT inhaler    Sig: Inhale 2 puffs into the lungs every 4 (four) hours as needed for wheezing or shortness of breath.    Dispense:  1 Inhaler    Refill:  2

## 2017-03-19 NOTE — Patient Instructions (Addendum)
You possibly have pneumonia. Or you could have the flu. Please go and get your chest xray. I will order medications based upon those results.   Please go to our [facility] office to get your xrays done. You can walk in M-F between 8am and 5pm. Tell them you are there for xrays ordered by me. They will send me the results, then I will let you know the results with instructions.   Address: [place], [place], [place]  (office sits at [address] at [address] intersection; from here, turn left onto [address]), take to [address], turn right and go for a mile or so, office will be on left across form [employer] )  After I get the CXR result, I will send medicines over to your pharmacy: tamiflu or an antibiotic.   I've sent in albuterol inhaler, robitussin with codiene already.

## 2017-03-25 ENCOUNTER — Encounter: Payer: Self-pay | Admitting: Family Medicine

## 2017-03-25 NOTE — Telephone Encounter (Signed)
Would let her discuss with Dr. Jonni Sanger. Pneumonia should show up on chest x-ray. Chest x-ray was negative meaning this issue is an upper respiratory tract issue -- 2/2 flu-like illness, potentially a bronchitis developing.    If she has worsening or new symptoms, she should come in to see Dr. Jonni Sanger.

## 2017-03-31 ENCOUNTER — Other Ambulatory Visit: Payer: Self-pay | Admitting: Family Medicine

## 2017-03-31 ENCOUNTER — Encounter: Payer: Self-pay | Admitting: Family Medicine

## 2017-03-31 MED ORDER — GUAIFENESIN-CODEINE 100-10 MG/5ML PO SOLN: 5.0000 mL | Freq: Four times a day (QID) | ORAL | 0 refills | Status: DC | PRN

## 2017-03-31 MED FILL — GUAIATUSSIN AC LIQUID: 100-10 | 6 days supply | Qty: 120 | Fill #0

## 2017-03-31 NOTE — Telephone Encounter (Signed)
Okay to refill?  Please Advise?   Doloris Hall,  LPN

## 2017-04-09 MED FILL — traMADol HCL 50 MG TABS: 50 | 15 days supply | Qty: 60 | Fill #3

## 2017-04-14 MED FILL — ALPRAZolam 1 MG TABS: 1 | 30 days supply | Qty: 90 | Fill #2

## 2017-04-14 MED FILL — ZOLPIDEM TARTRATE 10 MG TAB: 10 | 30 days supply | Qty: 30 | Fill #2

## 2017-05-08 MED FILL — ZOLPIDEM TARTRATE 10 MG TAB: 10 | 30 days supply | Qty: 30 | Fill #3

## 2017-05-08 MED FILL — ALPRAZolam 1 MG TABS: 1 | 30 days supply | Qty: 90 | Fill #3

## 2017-05-20 MED FILL — TOPIRAMATE 25 MG TABLET: 25 | 60 days supply | Qty: 120 | Fill #2

## 2017-05-20 MED FILL — traMADol HCL 50 MG TABS: 50 | 15 days supply | Qty: 60 | Fill #4

## 2017-05-27 ENCOUNTER — Encounter: Payer: Self-pay | Admitting: Family Medicine

## 2017-05-28 MED ORDER — ONDANSETRON HCL 4 MG PO TABS: 4.0000 mg | ORAL_TABLET | Freq: Three times a day (TID) | ORAL | 0 refills | Status: DC | PRN

## 2017-05-28 MED FILL — ONDANSETRON HCL 4 MG TABLET: 4 | 6 days supply | Qty: 20 | Fill #0

## 2017-06-04 MED FILL — ZOLPIDEM TARTRATE 10 MG TAB: 10 | 30 days supply | Qty: 30 | Fill #4

## 2017-06-04 MED FILL — ALPRAZolam 1 MG TABS: 1 | 30 days supply | Qty: 90 | Fill #4

## 2017-06-26 MED FILL — HYDROCODON-APAP 5-325: 5-325 | 2 days supply | Qty: 12 | Fill #0

## 2017-06-26 MED FILL — AMOXICILLIN 250 MG CAPSULE: 250 | 5 days supply | Qty: 15 | Fill #0

## 2017-06-29 ENCOUNTER — Other Ambulatory Visit: Payer: Self-pay | Admitting: Neurology

## 2017-06-29 DIAGNOSIS — M791 Myalgia, unspecified site: Secondary | ICD-10-CM

## 2017-06-29 DIAGNOSIS — W19XXXA Unspecified fall, initial encounter: Secondary | ICD-10-CM

## 2017-06-29 DIAGNOSIS — M7918 Myalgia, other site: Secondary | ICD-10-CM

## 2017-06-29 MED FILL — traMADol HCL 50 MG TABS: 50 | 15 days supply | Qty: 60 | Fill #0

## 2017-07-02 MED FILL — ALPRAZolam 1 MG TABS: 1 | 30 days supply | Qty: 90 | Fill #5

## 2017-07-02 MED FILL — ZOLPIDEM TARTRATE 10 MG TAB: 10 | 30 days supply | Qty: 30 | Fill #5

## 2017-07-26 ENCOUNTER — Other Ambulatory Visit: Payer: Self-pay | Admitting: Neurology

## 2017-07-26 ENCOUNTER — Encounter: Payer: Self-pay | Admitting: Family Medicine

## 2017-07-26 DIAGNOSIS — M7918 Myalgia, other site: Secondary | ICD-10-CM

## 2017-07-26 DIAGNOSIS — W19XXXA Unspecified fall, initial encounter: Secondary | ICD-10-CM

## 2017-07-26 DIAGNOSIS — M791 Myalgia, unspecified site: Secondary | ICD-10-CM

## 2017-07-26 DIAGNOSIS — R42 Dizziness and giddiness: Secondary | ICD-10-CM

## 2017-07-27 ENCOUNTER — Other Ambulatory Visit: Payer: Self-pay | Admitting: Neurology

## 2017-07-27 DIAGNOSIS — W19XXXA Unspecified fall, initial encounter: Secondary | ICD-10-CM

## 2017-07-27 DIAGNOSIS — M7918 Myalgia, other site: Secondary | ICD-10-CM

## 2017-07-27 DIAGNOSIS — M791 Myalgia, unspecified site: Secondary | ICD-10-CM

## 2017-07-27 MED ORDER — ZOLPIDEM TARTRATE 10 MG PO TABS: 10.0000 mg | ORAL_TABLET | Freq: Every evening | ORAL | 5 refills | Status: DC | PRN

## 2017-07-27 MED ORDER — TRAMADOL HCL 50 MG PO TABS: 50.0000 mg | ORAL_TABLET | Freq: Four times a day (QID) | ORAL | 0 refills | Status: DC | PRN

## 2017-07-27 MED ORDER — ALPRAZOLAM 1 MG PO TABS: ORAL_TABLET | ORAL | 3 refills | Status: DC

## 2017-07-27 MED FILL — traMADol HCL 50 MG TABS: 50 | 15 days supply | Qty: 60 | Fill #0

## 2017-07-27 NOTE — Telephone Encounter (Signed)
Last OV: 03/11/2017

## 2017-07-27 NOTE — Telephone Encounter (Signed)
Rx sent electronically.  

## 2017-07-27 NOTE — Telephone Encounter (Signed)
Rx registry checked. 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 Cathren Laine absence on 06/29/17 for #60. She had also gotten an Rx for Hydrocodone #12 on 06/26/17 by a Dr. Frederik Schmidt. She also has Rx for Xanax #90, Ambien #30 filled on 07/02/17 by an Forestine Na. Last OV was 07/02/2016 and does not have a follow up scheduled.

## 2017-07-29 MED FILL — ZOLPIDEM TARTRATE 10 MG TAB: 10 | 30 days supply | Qty: 30 | Fill #0

## 2017-07-29 MED FILL — ALPRAZolam 1 MG TABS: 1 | 30 days supply | Qty: 90 | Fill #0

## 2017-08-03 ENCOUNTER — Encounter: Payer: Self-pay | Admitting: Family Medicine

## 2017-08-04 ENCOUNTER — Telehealth: Payer: Self-pay | Admitting: Emergency Medicine

## 2017-08-04 NOTE — Telephone Encounter (Signed)
Patient is going to send over paperwork to be filled out.   Doloris Hall,  LPN

## 2017-08-04 NOTE — Telephone Encounter (Signed)
I can complete FMLA paperwork. She needs to provide it; can upload or can complete when she returns. Can send in work note for excuse out of work x 2 weeks in the interime

## 2017-08-04 NOTE — Telephone Encounter (Signed)
Copied from [place] [id number]. Topic: General - Other >> Aug 04, 2017 12:27 PM Synthia Innocent wrote: Reason for CRM: Would like to know if Dr Jonni Sanger will fill out FMLA paperwork for her, patient is in [place] and sister is brain dead and does not feel like she is able to come back and work. Please advise.    LM TO RC

## 2017-08-04 NOTE — Telephone Encounter (Signed)
Reason for CRM: Would like to know if Dr Jonni Sanger will fill out FMLA paperwork for her, patient is in [place] and sister is brain dead and does not feel like she is able to come back and work. Please advise.   Patient is calling stating that her sister had a stroke and now they have pronounced her brain dead, she states that she wants to take a few weeks off of work and wants to know if you would sign the FMLA papers.   Please advise.  Doloris Hall,  LPN

## 2017-08-04 NOTE — Telephone Encounter (Signed)
Left Message for Patient to Return Call

## 2017-08-05 ENCOUNTER — Encounter: Payer: Self-pay | Admitting: Family Medicine

## 2017-08-06 ENCOUNTER — Telehealth: Payer: Self-pay | Admitting: Family Medicine

## 2017-08-06 NOTE — Telephone Encounter (Signed)
fmla paperwork is here. Thanks.

## 2017-08-06 NOTE — Telephone Encounter (Signed)
Received FMLA by fax for pt. Placed in bin upfront with charge sheet.

## 2017-08-06 NOTE — Telephone Encounter (Signed)
Paperwork filled out and sent back to patient.   Doloris Hall,  LPN

## 2017-08-11 ENCOUNTER — Encounter: Payer: Self-pay | Admitting: Emergency Medicine

## 2017-08-13 ENCOUNTER — Encounter: Payer: Self-pay | Admitting: Family Medicine

## 2017-08-13 ENCOUNTER — Other Ambulatory Visit: Payer: Self-pay

## 2017-08-13 ENCOUNTER — Ambulatory Visit (INDEPENDENT_AMBULATORY_CARE_PROVIDER_SITE_OTHER): Payer: 59 | Admitting: Family Medicine

## 2017-08-13 ENCOUNTER — Telehealth: Payer: Self-pay | Admitting: General Practice

## 2017-08-13 VITALS — BP 122/80 | HR 72 | Temp 98.1°F | Ht 60.25 in | Wt 166.4 lb

## 2017-08-13 DIAGNOSIS — F4321 Adjustment disorder with depressed mood: Secondary | ICD-10-CM

## 2017-08-13 MED ORDER — HYDROXYZINE HCL 25 MG PO TABS: 25.0000 mg | ORAL_TABLET | Freq: Every day | ORAL | 2 refills | Status: DC

## 2017-08-13 MED ORDER — BUSPIRONE HCL 7.5 MG PO TABS: 7.5000 mg | ORAL_TABLET | Freq: Three times a day (TID) | ORAL | 1 refills | Status: DC | PRN

## 2017-08-13 MED FILL — hydrOXYzine HCL 25 MG TABS: 25 | 30 days supply | Qty: 30 | Fill #0

## 2017-08-13 MED FILL — busPIRone HCL 7.5 MG TABS: 7.5 | 20 days supply | Qty: 60 | Fill #0

## 2017-08-13 NOTE — Telephone Encounter (Signed)
FYI.   CRM stating that pt had scheduled an appt on Monday due to her sister passing away. per the notes "The effect my sister's passing is having on my emotional and psychological status. Cannot sleep and am having a very hard time with normal daily activities" I called pt and scheduled her for today instead.

## 2017-08-13 NOTE — Progress Notes (Signed)
Subjective  CC:  Chief Complaint  Patient presents with  . Anxiety    patient's sister passed away she is having a rough time. very stressed. States she is taking xanax to cope   . Insomnia    Patient states that that Ambien is not helping    HPI: Denise Rice is a 67 y.o. female who presents to the office today to address the problems listed above in the chief complaint.  See mychart notes  Pt's sister passed away; pt was at bedside after extubation.   Grief has been difficult: not sleeping well, poor appetite. Panic sxs requiring xtra xanax. Poor concentration. Has good family support. Has h/o anxiety; has chronic vertigo on long term xanax. On mood meds.   Assessment  1. Grief reaction       Plan   grief:  Today's visit was 30 minutes long. Greater than 50% of this time was devoted to face to face counseling with the patient and coordination of care. We discussed her diagnosis, prognosis, treatment options and treatment plan is documented below.  Discussed adjusting meds: decrease xanax. Add buspar prn for anxiety and add hydroxyzine for sleep at night  rec counseling. She will seek help from eap program.   Has support from family and co-workers.  At risk for prolonged grief giving daughter's upcoming move to [place] for college     Follow up: Return if symptoms worsen or fail to improve.   No orders of the defined types were placed in this encounter.  Meds ordered this encounter  Medications  . hydrOXYzine (ATARAX/VISTARIL) 25 MG tablet    Sig: Take 1 tablet (25 mg total) by mouth at bedtime.    Dispense:  30 tablet    Refill:  2  . busPIRone (BUSPAR) 7.5 MG tablet    Sig: Take 1 tablet (7.5 mg total) by mouth 3 (three) times daily as needed (anxiety).    Dispense:  60 tablet    Refill:  1      I reviewed the patients updated PMH, FH, and SocHx.    Patient Active Problem List   Diagnosis Date Noted  . Bilateral sensorineural hearing loss 09/06/2014   Priority: High  . Chronic vertigo 03/11/2017    Priority: Medium  . Major depression, chronic 03/11/2017    Priority: Medium  . PCOS (polycystic ovarian syndrome) 03/11/2017    Priority: Medium  . Insomnia 09/08/2015    Priority: Medium  . 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 History of laparoscopic adjustable gastric banding 01/22/2011    Priority: Medium  . 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 Vitamin D deficiency 01/15/2008    Priority: Low   Current Meds  Medication Sig  . ALPRAZolam (XANAX) 1 MG tablet TAKE 1 TABLET BY MOUTH 3 TIMES DAILY AS NEEDED FOR ANXIETY  . Biotin 5000 MCG CAPS Take by mouth daily.  . Calcium Carbonate-Vitamin D (CALCIUM 600 + D PO) Take 1 tablet by mouth daily.   . cyanocobalamin 2000 MCG tablet Take 2,000 mcg by mouth daily.  Marland Kitchen escitalopram (LEXAPRO) 20 MG tablet Take 1 tablet by mouth daily.  . ferrous sulfate 325 (65 FE) MG tablet Take 325 mg by mouth daily with breakfast.  . ibuprofen (ADVIL,MOTRIN) 600 MG tablet Take 1 tablet (600 mg total) by mouth every 6 (six) hours as needed.  . Multiple Vitamins-Minerals (MULTIVITAMIN ADULT PO) Take 1 tablet  by mouth.  . topiramate (TOPAMAX) 25 MG tablet Take 1 tablet (25 mg total) by mouth 2 (two) times daily. (Patient taking differently: Take 25 mg by mouth as needed. )  . traMADol (ULTRAM) 50 MG tablet Take 1 tablet (50 mg total) by mouth every 6 (six) hours as needed.  . zolpidem (AMBIEN) 10 MG tablet Take 1 tablet (10 mg total) by mouth at bedtime as needed.    Allergies: Patient is allergic to topiramate. Family History: Patient family history includes Cancer in her father, paternal aunt, and paternal uncle; Diabetes in her maternal aunt and sister; Heart attack in her mother; Heart disease in her maternal aunt and mother; Stroke in her sister. Social History:  Patient  reports that she has never smoked. She has never used smokeless  tobacco. She reports that she does not drink alcohol or use drugs.  Review of Systems: Constitutional: Negative for fever malaise or anorexia Cardiovascular: negative for chest pain Respiratory: negative for SOB or persistent cough Gastrointestinal: negative for abdominal pain  Objective  Vitals: BP 122/80   Pulse 72   Temp 98.1 F (36.7 C)   Ht 5' 0.25" (1.53 m)   Wt 166 lb 6.4 oz (75.5 kg)   LMP 01/13/2005   SpO2 99%   BMI 32.23 kg/m  General: no acute distress , A&Ox3, appropriately sad    Commons side effects, risks, benefits, and alternatives for medications and treatment plan prescribed today were discussed, and the patient expressed understanding of the given instructions. Patient is instructed to call or message via MyChart if he/she has any questions or concerns regarding our treatment plan. No barriers to understanding were identified. We discussed Red Flag symptoms and signs in detail. Patient expressed understanding regarding what to do in case of urgent or emergency type symptoms.   Medication list was reconciled, printed and provided to the patient in AVS. Patient instructions and summary information was reviewed with the patient as documented in the AVS. This note was prepared with assistance of Dragon voice recognition software. Occasional wrong-word or sound-a-like substitutions may have occurred due to the inherent limitations of voice recognition software

## 2017-08-14 ENCOUNTER — Encounter: Payer: Self-pay | Admitting: Family Medicine

## 2017-08-17 ENCOUNTER — Ambulatory Visit: Payer: 59 | Admitting: Family Medicine

## 2017-08-24 ENCOUNTER — Other Ambulatory Visit: Payer: Self-pay | Admitting: Emergency Medicine

## 2017-08-24 ENCOUNTER — Other Ambulatory Visit: Payer: Self-pay | Admitting: Neurology

## 2017-08-24 DIAGNOSIS — M791 Myalgia, unspecified site: Secondary | ICD-10-CM

## 2017-08-24 DIAGNOSIS — M7918 Myalgia, other site: Secondary | ICD-10-CM

## 2017-08-24 DIAGNOSIS — W19XXXA Unspecified fall, initial encounter: Secondary | ICD-10-CM

## 2017-08-24 NOTE — Telephone Encounter (Signed)
Error

## 2017-08-26 DIAGNOSIS — H02886 Meibomian gland dysfunction of left eye, unspecified eyelid: Secondary | ICD-10-CM | POA: Diagnosis not present

## 2017-08-26 DIAGNOSIS — Z9889 Other specified postprocedural states: Secondary | ICD-10-CM | POA: Diagnosis not present

## 2017-08-26 DIAGNOSIS — H02883 Meibomian gland dysfunction of right eye, unspecified eyelid: Secondary | ICD-10-CM | POA: Diagnosis not present

## 2017-08-26 DIAGNOSIS — H2513 Age-related nuclear cataract, bilateral: Secondary | ICD-10-CM | POA: Diagnosis not present

## 2017-08-26 MED FILL — traMADol HCL 50 MG TABS: 50 | 15 days supply | Qty: 60 | Fill #0

## 2017-08-26 NOTE — Telephone Encounter (Signed)
Rx registry checked. Last fill date is 07/27/17 for #60. No follow up visits scheduled.

## 2017-08-27 ENCOUNTER — Encounter: Payer: Self-pay | Admitting: Family Medicine

## 2017-08-27 MED ORDER — ESCITALOPRAM OXALATE 20 MG PO TABS: 20.0000 mg | ORAL_TABLET | Freq: Every day | ORAL | 3 refills | Status: DC

## 2017-08-27 MED FILL — ALPRAZolam 1 MG TABS: 1 | 30 days supply | Qty: 90 | Fill #1

## 2017-08-27 MED FILL — ESCITALOPRAM 20 MG TABLET: 20 | 90 days supply | Qty: 90 | Fill #0

## 2017-08-27 MED FILL — ZOLPIDEM TARTRATE 10 MG TAB: 10 | 30 days supply | Qty: 30 | Fill #1

## 2017-08-27 MED FILL — TOBRAMYCIN-DEXAMETH OPTH SU: 0.3-0.1 | 16 days supply | Qty: 5 | Fill #0

## 2017-08-27 MED FILL — DOXYCYCLINE HYCLATE 100 MG: 100 | 14 days supply | Qty: 28 | Fill #0

## 2017-08-29 MED FILL — busPIRone HCL 7.5 MG TABS: 7.5 | 20 days supply | Qty: 60 | Fill #1

## 2017-09-03 ENCOUNTER — Telehealth: Payer: Self-pay | Admitting: Family Medicine

## 2017-09-03 NOTE — Telephone Encounter (Signed)
Received via fax FMLA forms for Dr. Jonni Sanger. Please complete forms. Contact pt for detail.

## 2017-09-04 NOTE — Telephone Encounter (Signed)
Spoke with Patient and she states that she has canceled FMLA and no longer needs the forms filled out. I advised Patient if there is anything further we could do for her to please let us know. Patient verbalized understanding.  Denise Hall,  LPN

## 2017-09-04 NOTE — Telephone Encounter (Signed)
LM to RC  

## 2017-09-07 MED FILL — DOXYCYCLINE HYCLATE 100 MG: 100 | 10 days supply | Qty: 20 | Fill #0

## 2017-09-09 MED FILL — TOBRAMYCIN-DEXAMETH OPTH SU: 0.3-0.1 | 16 days supply | Qty: 5 | Fill #0

## 2017-09-11 MED FILL — hydrOXYzine HCL 25 MG TABS: 25 | 30 days supply | Qty: 30 | Fill #1

## 2017-09-17 ENCOUNTER — Ambulatory Visit: Payer: 59 | Admitting: Obstetrics & Gynecology

## 2017-09-21 ENCOUNTER — Other Ambulatory Visit: Payer: Self-pay | Admitting: Neurology

## 2017-09-21 ENCOUNTER — Telehealth: Payer: Self-pay | Admitting: Emergency Medicine

## 2017-09-21 ENCOUNTER — Other Ambulatory Visit: Payer: Self-pay | Admitting: Family Medicine

## 2017-09-21 DIAGNOSIS — M791 Myalgia, unspecified site: Secondary | ICD-10-CM

## 2017-09-21 DIAGNOSIS — M7918 Myalgia, other site: Secondary | ICD-10-CM

## 2017-09-21 DIAGNOSIS — W19XXXA Unspecified fall, initial encounter: Secondary | ICD-10-CM

## 2017-09-21 MED FILL — busPIRone HCL 7.5 MG TABS: 7.5 | 30 days supply | Qty: 90 | Fill #0

## 2017-09-21 NOTE — Telephone Encounter (Signed)
Spoke with Patient, she states that she is having problems with Anxiety, she feels like her heart is going to beat out of her chest at times, taking Buspar & Xanax as Prescribed but she states it is just not working. Patient states that she doesn't feel like she will hurt herself or others. Patient states she has episodes of screaming and crying uncontrollably. I advised patient that if she gets to the point that she need immediate help to proceed to the ER. Appt made tomorrow at 10:15am.  Doloris Hall,  LPN

## 2017-09-21 NOTE — Telephone Encounter (Signed)
Copied from [place] [id number] [id number]. Topic: General - Other >> Sep 21, 2017  4:42 PM Valla Leaver wrote: Reason for CRM: Patient would like a call back from Dr. Jonni Sanger or cma about anxiety attacked since she lost her only sibling two weeks ago. Declined appt.

## 2017-09-21 NOTE — Telephone Encounter (Signed)
Last OV 08/13/17, No future OV  Last filled 08/13/17, # 60 with 1 refill

## 2017-09-22 ENCOUNTER — Ambulatory Visit (INDEPENDENT_AMBULATORY_CARE_PROVIDER_SITE_OTHER): Payer: 59 | Admitting: Family Medicine

## 2017-09-22 ENCOUNTER — Other Ambulatory Visit: Payer: Self-pay

## 2017-09-22 ENCOUNTER — Encounter: Payer: Self-pay | Admitting: Family Medicine

## 2017-09-22 VITALS — BP 120/72 | HR 72 | Temp 97.8°F | Ht 60.25 in | Wt 163.8 lb

## 2017-09-22 DIAGNOSIS — F4321 Adjustment disorder with depressed mood: Secondary | ICD-10-CM | POA: Diagnosis not present

## 2017-09-22 DIAGNOSIS — R42 Dizziness and giddiness: Secondary | ICD-10-CM | POA: Diagnosis not present

## 2017-09-22 MED ORDER — ALPRAZOLAM 1 MG PO TABS: ORAL_TABLET | ORAL | 3 refills | Status: DC

## 2017-09-22 MED FILL — ALPRAZolam 1 MG TABS: 1 | 30 days supply | Qty: 90 | Fill #0

## 2017-09-22 NOTE — Progress Notes (Signed)
Subjective  CC:  Chief Complaint  Patient presents with  . Anxiety    having episodes of crying, patient states her chest feels tight at times    HPI: Denise Rice is a 67 y.o. female who presents to the office today to address the problems listed above in the chief complaint.  Denise Rice is having a hard time coping with loss of her sister.  Had severe panic attack several days ago.  Using Xanax 3 times daily which was her chronic dose of her vertigo, added BuSpar which is helped significantly.  Using this 3 times a day.  She has had a counseling to be done this week.  Assessment  1. Grief reaction   2. Dizziness and giddiness      Plan   Complicated grief reaction:  Today's visit was 30 minutes long. Greater than 50% of this time was devoted to face to face counseling with the patient and coordination of care. We discussed her diagnosis, prognosis, treatment options and treatment plan is documented below.   Discussed coping mechanisms, relaxation techniques, refill medications without changes, and agree with counseling.  Follow up: PRN  No orders of the defined types were placed in this encounter.  Meds ordered this encounter  Medications  . ALPRAZolam (XANAX) 1 MG tablet    Sig: TAKE 1 TABLET BY MOUTH 3 TIMES DAILY AS NEEDED FOR ANXIETY    Dispense:  90 tablet    Refill:  3    Please fill early. Due to grief reaction      I reviewed the patients updated PMH, FH, and SocHx.    Patient Active Problem List   Diagnosis Date Noted  . Bilateral sensorineural hearing loss 09/06/2014    Priority: High  . Chronic vertigo 03/11/2017    Priority: Medium  . Major depression, chronic 03/11/2017    Priority: Medium  . PCOS (polycystic ovarian syndrome) 03/11/2017    Priority: Medium  . Insomnia 09/08/2015    Priority: Medium  . 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 History of laparoscopic adjustable gastric banding 01/22/2011    Priority: Medium  .  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 Vitamin D deficiency 01/15/2008    Priority: Low   Current Meds  Medication Sig  . ALPRAZolam (XANAX) 1 MG tablet TAKE 1 TABLET BY MOUTH 3 TIMES DAILY AS NEEDED FOR ANXIETY  . Biotin 5000 MCG CAPS Take by mouth daily.  . busPIRone (BUSPAR) 7.5 MG tablet TAKE 1 TABLET (7.5 MG TOTAL) BY MOUTH 3 (THREE) TIMES DAILY AS NEEDED (ANXIETY).  . Calcium Carbonate-Vitamin D (CALCIUM 600 + D PO) Take 1 tablet by mouth daily.   . cyanocobalamin 2000 MCG tablet Take 2,000 mcg by mouth daily.  Marland Kitchen doxycycline (VIBRAMYCIN) 100 MG capsule Take 100 mg by mouth 2 (two) times daily.  Marland Kitchen escitalopram (LEXAPRO) 20 MG tablet Take 1 tablet (20 mg total) by mouth daily.  . ferrous sulfate 325 (65 FE) MG tablet Take 325 mg by mouth daily with breakfast.  . hydrOXYzine (ATARAX/VISTARIL) 25 MG tablet Take 1 tablet (25 mg total) by mouth at bedtime.  Marland Kitchen ibuprofen (ADVIL,MOTRIN) 600 MG tablet Take 1 tablet (600 mg total) by mouth every 6 (six) hours as needed.  . Multiple Vitamins-Minerals (MULTIVITAMIN ADULT PO) Take 1 tablet by mouth.  . zolpidem (AMBIEN) 10 MG tablet Take 1 tablet (10 mg total) by mouth at bedtime as  needed.  . [DISCONTINUED] ALPRAZolam (XANAX) 1 MG tablet TAKE 1 TABLET BY MOUTH 3 TIMES DAILY AS NEEDED FOR ANXIETY    Allergies: Patient is allergic to topiramate. Family History: Patient family history includes Cancer in her father, paternal aunt, and paternal uncle; Diabetes in her maternal aunt and sister; Heart attack in her mother; Heart disease in her maternal aunt and mother; Stroke in her sister. Social History:  Patient  reports that she has never smoked. She has never used smokeless tobacco. She reports that she does not drink alcohol or use drugs.  Review of Systems: Constitutional: Negative for fever malaise or anorexia Cardiovascular: negative for chest pain Respiratory: negative for SOB or persistent  cough Gastrointestinal: negative for abdominal pain  Objective  Vitals: BP 120/72   Pulse 72   Temp 97.8 F (36.6 C)   Ht 5' 0.25" (1.53 m)   Wt 163 lb 12.8 oz (74.3 kg)   LMP 01/13/2005   SpO2 98%   BMI 31.73 kg/m  General: no acute distress , A&Ox3     Commons side effects, risks, benefits, and alternatives for medications and treatment plan prescribed today were discussed, and the patient expressed understanding of the given instructions. Patient is instructed to call or message via MyChart if he/she has any questions or concerns regarding our treatment plan. No barriers to understanding were identified. We discussed Red Flag symptoms and signs in detail. Patient expressed understanding regarding what to do in case of urgent or emergency type symptoms.   Medication list was reconciled, printed and provided to the patient in AVS. Patient instructions and summary information was reviewed with the patient as documented in the AVS. This note was prepared with assistance of Dragon voice recognition software. Occasional wrong-word or sound-a-like substitutions may have occurred due to the inherent limitations of voice recognition software

## 2017-09-24 ENCOUNTER — Ambulatory Visit (INDEPENDENT_AMBULATORY_CARE_PROVIDER_SITE_OTHER): Payer: 59 | Admitting: Obstetrics & Gynecology

## 2017-09-24 ENCOUNTER — Encounter: Payer: Self-pay | Admitting: Obstetrics & Gynecology

## 2017-09-24 ENCOUNTER — Other Ambulatory Visit: Payer: Self-pay | Admitting: Obstetrics & Gynecology

## 2017-09-24 VITALS — BP 106/80 | HR 72 | Resp 16 | Ht 60.75 in | Wt 164.2 lb

## 2017-09-24 DIAGNOSIS — Z01419 Encounter for gynecological examination (general) (routine) without abnormal findings: Secondary | ICD-10-CM | POA: Diagnosis not present

## 2017-09-24 DIAGNOSIS — Z1231 Encounter for screening mammogram for malignant neoplasm of breast: Secondary | ICD-10-CM

## 2017-09-24 DIAGNOSIS — E2839 Other primary ovarian failure: Secondary | ICD-10-CM | POA: Diagnosis not present

## 2017-09-24 MED ORDER — RANITIDINE HCL 150 MG PO CAPS: 150.0000 mg | ORAL_CAPSULE | Freq: Two times a day (BID) | ORAL | 1 refills | Status: DC

## 2017-09-24 MED FILL — ZOLPIDEM TARTRATE 10 MG TAB: 10 | 30 days supply | Qty: 30 | Fill #2

## 2017-09-24 MED FILL — raNITIdine HCL 150 MG TABS: 150 | 30 days supply | Qty: 60 | Fill #0

## 2017-09-24 NOTE — Patient Instructions (Signed)
Dr. Shon Hough [facility] [place]

## 2017-09-24 NOTE — Progress Notes (Addendum)
67 y.o. G0P0 MarriedCaucasianF here for annual exam.  Lost her sister this year.  Is going to see Mikki Santee, Clinical biochemist.  Pleased about seeing him next week.  Shirlee Limerick is now in [place].  She loves it.      Patient's last menstrual period was 01/13/2005.          Sexually active: Yes.    The current method of family planning is status post hysterectomy.    Exercising: Yes.    weights, aerobics  Smoker:  no  Health Maintenance: Pap:  09/12/16 Neg  History of abnormal Pap:  Yes, remote hx MMG:  06/20/16 BIRADS1:Neg  Colonoscopy:  10/12/15 f/u 10 years  BMD:  Years ago TDaP:  2018 Pneumonia vaccine(s):  2016 Shingrix:  Zostavax Hep C testing: 09/07/15 Neg  Screening Labs: PCP   reports that she has never smoked. She has never used smokeless tobacco. She reports that she does not drink alcohol or use drugs.  Past Medical History:  Diagnosis Date  . Anal condyloma   . Deafness in left ear   . Hearing loss, sensorineural, high frequency    RIGHT EAR  . 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 Last eruption 3/12  . History of peptic ulcer   . Otosclerosis of left ear   . PCO (polycystic ovaries)   . PCOS (polycystic ovarian syndrome) 03/11/2017  . PONV (postoperative nausea and vomiting)   . 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 Wears hearing aid    both ears    Past Surgical History:  Procedure Laterality Date  . BLEPHAROPLASTY  03-05-2000  . CRANIOTOMY  08/2014   to repair dehescence in right ear  . HAMMER TOE SURGERY Left 08-29-2013   2nd toe  . IMPLANTATION BONE ANCHORED HEARING AID Left 07/27/2008   left temporal bone;now removed  . LAPAROSCOPIC CHOLECYSTECTOMY  03-31-2005  . LAPAROSCOPIC GASTRIC BANDING  02-08-2007  . LASER ABLATION CONDOLAMATA N/A 07/30/2012   Procedure: LASER ABLATION CONDOLAMATA;  Surgeon: Leighton Ruff, MD;  Location: [facility];  Service: General;  Laterality: N/A;  . LASIK  Bilateral 2002  . ROUX-EN-Y GASTRIC BYPASS  AUG 2013  . [place];  1994 x2;  02-03-1998  . STRABISMUS SURGERY Right   . VAGINAL HYSTERECTOMY  2007   fibroids    Current Outpatient Medications  Medication Sig Dispense Refill  . ALPRAZolam (XANAX) 1 MG tablet TAKE 1 TABLET BY MOUTH 3 TIMES DAILY AS NEEDED FOR ANXIETY 90 tablet 3  . Biotin 5000 MCG CAPS Take by mouth daily.    . busPIRone (BUSPAR) 7.5 MG tablet TAKE 1 TABLET (7.5 MG TOTAL) BY MOUTH 3 (THREE) TIMES DAILY AS NEEDED (ANXIETY). 90 tablet 1  . Calcium Carbonate-Vitamin D (CALCIUM 600 + D PO) Take 1 tablet by mouth daily.     . cyanocobalamin 2000 MCG tablet Take 2,000 mcg by mouth daily.    Marland Kitchen escitalopram (LEXAPRO) 20 MG tablet Take 1 tablet (20 mg total) by mouth daily. 90 tablet 3  . ferrous sulfate 325 (65 FE) MG tablet Take 325 mg by mouth daily with breakfast.    . hydrOXYzine (ATARAX/VISTARIL) 25 MG tablet Take 1 tablet (25 mg total) by mouth at bedtime. 30 tablet 2  . ibuprofen (ADVIL,MOTRIN) 600 MG tablet Take 1 tablet (600 mg total) by mouth every 6 (six) hours as needed. 30 tablet 0  . Multiple Vitamins-Minerals (MULTIVITAMIN ADULT PO)  Take 1 tablet by mouth.    . topiramate (TOPAMAX) 25 MG tablet Take 1 tablet (25 mg total) by mouth 2 (two) times daily. (Patient taking differently: Take 25 mg by mouth as needed. ) 120 tablet 6  . zolpidem (AMBIEN) 10 MG tablet Take 1 tablet (10 mg total) by mouth at bedtime as needed. 30 tablet 5  . traMADol (ULTRAM) 50 MG tablet TAKE 1 TABLET BY MOUTH EVERY 6 HOURS AS NEEDED. (Patient not taking: Reported on 09/24/2017) 60 tablet 0   No current facility-administered medications for this visit.     Family History  Problem Relation Age of Onset  . Heart attack Mother   . Heart disease Mother   . Cancer Father        unknown primary  . Stroke Sister   . Diabetes Sister   . Diabetes Maternal Aunt   . Heart disease Maternal Aunt   . Cancer Paternal Aunt   .  Cancer Paternal Uncle   . Colon cancer Neg Hx   . Colon polyps Neg Hx     Review of Systems  HENT: Positive for hearing loss.   Gastrointestinal: Positive for constipation.  Musculoskeletal: Positive for myalgias.  Psychiatric/Behavioral: Positive for depression. The patient is nervous/anxious.   All other systems reviewed and are negative.   Exam:   BP 106/80 (BP Location: Left Arm, Patient Position: Sitting, Cuff Size: Large)   Pulse 72   Resp 16   Ht 5' 0.75" (1.543 m)   Wt 164 lb 3.2 oz (74.5 kg)   LMP 01/13/2005   BMI 31.28 kg/m   Height: 5' 0.75" (154.3 cm)  Ht Readings from Last 3 Encounters:  09/24/17 5' 0.75" (1.543 m)  09/22/17 5' 0.25" (1.53 m)  08/13/17 5' 0.25" (1.53 m)    General appearance: alert, cooperative and appears stated age Head: Normocephalic, without obvious abnormality, atraumatic Neck: no adenopathy, supple, symmetrical, trachea midline and thyroid normal to inspection and palpation Lungs: clear to auscultation bilaterally Breasts: normal appearance, no masses or tenderness Heart: regular rate and rhythm Abdomen: soft, non-tender; bowel sounds normal; no masses,  no organomegaly Extremities: extremities normal, atraumatic, no cyanosis or edema Skin: Skin color, texture, turgor normal. No rashes or lesions Lymph nodes: Cervical, supraclavicular, and axillary nodes normal. No abnormal inguinal nodes palpated Neurologic: Grossly normal   Pelvic: External genitalia:  no lesions              Urethra:  normal appearing urethra with no masses, tenderness or lesions              Bartholins and Skenes: normal                 Vagina: normal appearing vagina with normal color and discharge, no lesions              Cervix: absent              Pap taken: No. Bimanual Exam:  Uterus:  uterus absent              Adnexa: normal adnexa and no mass, fullness, tenderness               Rectovaginal: Confirms               Anus:  normal sphincter tone, no  lesions  Chaperone was present for exam.  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  Will schedule for pt with BMD.   pap smear not indicated Lab work done with Dr. Jonni Sanger Colonoscopy UTD. Vaccines UTD except Shingrix Return annually or prn

## 2017-10-06 MED FILL — hydrOXYzine HCL 25 MG TABS: 25 | 30 days supply | Qty: 30 | Fill #2

## 2017-10-12 DIAGNOSIS — H02886 Meibomian gland dysfunction of left eye, unspecified eyelid: Secondary | ICD-10-CM | POA: Diagnosis not present

## 2017-10-12 DIAGNOSIS — H02883 Meibomian gland dysfunction of right eye, unspecified eyelid: Secondary | ICD-10-CM | POA: Diagnosis not present

## 2017-10-12 DIAGNOSIS — B0052 Herpesviral keratitis: Secondary | ICD-10-CM | POA: Diagnosis not present

## 2017-10-12 DIAGNOSIS — H2513 Age-related nuclear cataract, bilateral: Secondary | ICD-10-CM | POA: Diagnosis not present

## 2017-10-15 ENCOUNTER — Other Ambulatory Visit: Payer: Self-pay | Admitting: Neurology

## 2017-10-15 ENCOUNTER — Other Ambulatory Visit: Payer: Self-pay | Admitting: Family Medicine

## 2017-10-15 DIAGNOSIS — W19XXXA Unspecified fall, initial encounter: Secondary | ICD-10-CM

## 2017-10-15 DIAGNOSIS — M791 Myalgia, unspecified site: Secondary | ICD-10-CM

## 2017-10-15 DIAGNOSIS — M7918 Myalgia, other site: Secondary | ICD-10-CM

## 2017-10-15 MED FILL — busPIRone HCL 7.5 MG TABS: 7.5 | 30 days supply | Qty: 90 | Fill #1

## 2017-10-15 MED FILL — traMADol HCL 50 MG TABS: 50 | 15 days supply | Qty: 60 | Fill #0

## 2017-10-15 NOTE — Telephone Encounter (Signed)
Rx registry checked. Last fill date is 08/26/17 for #60. Patient has not been seen since 06/2016.

## 2017-10-15 NOTE — Telephone Encounter (Signed)
Last OV: 09/22/2017 Last Fill: 08/13/2017  #30 with 3 RF's

## 2017-10-19 MED FILL — ALPRAZolam 1 MG TABS: 1 | 30 days supply | Qty: 90 | Fill #1

## 2017-10-21 ENCOUNTER — Other Ambulatory Visit: Payer: Self-pay | Admitting: Obstetrics & Gynecology

## 2017-10-21 ENCOUNTER — Ambulatory Visit
Admission: RE | Admit: 2017-10-21 | Discharge: 2017-10-21 | Disposition: A | Discharge status: Home / Self Care | Payer: 59 | Source: Ambulatory Visit | Attending: Obstetrics & Gynecology | Admitting: Obstetrics & Gynecology

## 2017-10-21 ENCOUNTER — Inpatient Hospital Stay: Admission: RE | Admit: 2017-10-21 | Payer: 59 | Source: Ambulatory Visit

## 2017-10-21 ENCOUNTER — Ambulatory Visit: Payer: 59

## 2017-10-21 DIAGNOSIS — Z1231 Encounter for screening mammogram for malignant neoplasm of breast: Secondary | ICD-10-CM

## 2017-10-21 DIAGNOSIS — E2839 Other primary ovarian failure: Secondary | ICD-10-CM

## 2017-10-21 MED FILL — ZOLPIDEM TARTRATE 10 MG TAB: 10 | 30 days supply | Qty: 30 | Fill #3

## 2017-10-30 MED FILL — hydrOXYzine HCL 25 MG TABS: 25 | 30 days supply | Qty: 30 | Fill #0

## 2017-11-16 ENCOUNTER — Other Ambulatory Visit: Payer: Self-pay | Admitting: Family Medicine

## 2017-11-16 DIAGNOSIS — H02885 Meibomian gland dysfunction left lower eyelid: Secondary | ICD-10-CM | POA: Diagnosis not present

## 2017-11-16 DIAGNOSIS — H02882 Meibomian gland dysfunction right lower eyelid: Secondary | ICD-10-CM | POA: Diagnosis not present

## 2017-11-16 DIAGNOSIS — H2513 Age-related nuclear cataract, bilateral: Secondary | ICD-10-CM | POA: Diagnosis not present

## 2017-11-16 DIAGNOSIS — B0052 Herpesviral keratitis: Secondary | ICD-10-CM | POA: Diagnosis not present

## 2017-11-16 MED FILL — busPIRone HCL 7.5 MG TABS: 7.5 | 30 days supply | Qty: 90 | Fill #0

## 2017-11-16 MED FILL — ALPRAZolam 1 MG TABS: 1 | 30 days supply | Qty: 90 | Fill #2

## 2017-11-17 ENCOUNTER — Inpatient Hospital Stay (HOSPITAL_COMMUNITY)
Admission: EM | Admit: 2017-11-17 | Discharge: 2017-11-19 | DRG: 337 | Disposition: A | Discharge status: Home / Self Care | Payer: 59 | Attending: Surgery | Admitting: Surgery

## 2017-11-17 ENCOUNTER — Encounter (HOSPITAL_COMMUNITY): Admission: EM | Disposition: A | Discharge status: Home / Self Care | Payer: Self-pay | Source: Home / Self Care

## 2017-11-17 ENCOUNTER — Emergency Department (HOSPITAL_COMMUNITY): Payer: 59

## 2017-11-17 ENCOUNTER — Emergency Department (HOSPITAL_COMMUNITY): Payer: 59 | Admitting: Anesthesiology

## 2017-11-17 ENCOUNTER — Encounter (HOSPITAL_COMMUNITY): Payer: Self-pay | Admitting: Emergency Medicine

## 2017-11-17 DIAGNOSIS — Z79899 Other long term (current) drug therapy: Secondary | ICD-10-CM

## 2017-11-17 DIAGNOSIS — Z974 Presence of external hearing-aid: Secondary | ICD-10-CM

## 2017-11-17 DIAGNOSIS — K565 Intestinal adhesions [bands], unspecified as to partial versus complete obstruction: Secondary | ICD-10-CM | POA: Diagnosis not present

## 2017-11-17 DIAGNOSIS — E282 Polycystic ovarian syndrome: Secondary | ICD-10-CM | POA: Diagnosis present

## 2017-11-17 DIAGNOSIS — Z90710 Acquired absence of both cervix and uterus: Secondary | ICD-10-CM | POA: Diagnosis not present

## 2017-11-17 DIAGNOSIS — Z8249 Family history of ischemic heart disease and other diseases of the circulatory system: Secondary | ICD-10-CM

## 2017-11-17 DIAGNOSIS — E669 Obesity, unspecified: Secondary | ICD-10-CM | POA: Diagnosis present

## 2017-11-17 DIAGNOSIS — R000 Tachycardia, unspecified: Secondary | ICD-10-CM | POA: Diagnosis not present

## 2017-11-17 DIAGNOSIS — E559 Vitamin D deficiency, unspecified: Secondary | ICD-10-CM | POA: Diagnosis not present

## 2017-11-17 DIAGNOSIS — K5651 Intestinal adhesions [bands], with partial obstruction: Secondary | ICD-10-CM | POA: Diagnosis not present

## 2017-11-17 DIAGNOSIS — K219 Gastro-esophageal reflux disease without esophagitis: Secondary | ICD-10-CM | POA: Diagnosis present

## 2017-11-17 DIAGNOSIS — I10 Essential (primary) hypertension: Secondary | ICD-10-CM | POA: Diagnosis not present

## 2017-11-17 DIAGNOSIS — F410 Panic disorder [episodic paroxysmal anxiety] without agoraphobia: Secondary | ICD-10-CM | POA: Diagnosis present

## 2017-11-17 DIAGNOSIS — R079 Chest pain, unspecified: Secondary | ICD-10-CM | POA: Diagnosis not present

## 2017-11-17 DIAGNOSIS — R61 Generalized hyperhidrosis: Secondary | ICD-10-CM | POA: Diagnosis not present

## 2017-11-17 DIAGNOSIS — Z791 Long term (current) use of non-steroidal anti-inflammatories (NSAID): Secondary | ICD-10-CM | POA: Diagnosis not present

## 2017-11-17 DIAGNOSIS — Z833 Family history of diabetes mellitus: Secondary | ICD-10-CM | POA: Diagnosis not present

## 2017-11-17 DIAGNOSIS — Z79891 Long term (current) use of opiate analgesic: Secondary | ICD-10-CM | POA: Diagnosis not present

## 2017-11-17 DIAGNOSIS — H903 Sensorineural hearing loss, bilateral: Secondary | ICD-10-CM | POA: Diagnosis present

## 2017-11-17 DIAGNOSIS — R10817 Generalized abdominal tenderness: Secondary | ICD-10-CM | POA: Diagnosis not present

## 2017-11-17 DIAGNOSIS — G4700 Insomnia, unspecified: Secondary | ICD-10-CM | POA: Diagnosis not present

## 2017-11-17 DIAGNOSIS — Z9884 Bariatric surgery status: Secondary | ICD-10-CM

## 2017-11-17 DIAGNOSIS — G8929 Other chronic pain: Secondary | ICD-10-CM | POA: Diagnosis present

## 2017-11-17 DIAGNOSIS — F419 Anxiety disorder, unspecified: Secondary | ICD-10-CM | POA: Diagnosis present

## 2017-11-17 DIAGNOSIS — Z8711 Personal history of peptic ulcer disease: Secondary | ICD-10-CM

## 2017-11-17 DIAGNOSIS — R109 Unspecified abdominal pain: Secondary | ICD-10-CM | POA: Diagnosis present

## 2017-11-17 DIAGNOSIS — Z823 Family history of stroke: Secondary | ICD-10-CM | POA: Diagnosis not present

## 2017-11-17 DIAGNOSIS — R42 Dizziness and giddiness: Secondary | ICD-10-CM | POA: Diagnosis present

## 2017-11-17 DIAGNOSIS — R1084 Generalized abdominal pain: Secondary | ICD-10-CM | POA: Diagnosis not present

## 2017-11-17 DIAGNOSIS — Z888 Allergy status to other drugs, medicaments and biological substances status: Secondary | ICD-10-CM | POA: Diagnosis not present

## 2017-11-17 DIAGNOSIS — K56609 Unspecified intestinal obstruction, unspecified as to partial versus complete obstruction: Secondary | ICD-10-CM | POA: Diagnosis present

## 2017-11-17 DIAGNOSIS — Z6830 Body mass index (BMI) 30.0-30.9, adult: Secondary | ICD-10-CM | POA: Diagnosis not present

## 2017-11-17 DIAGNOSIS — Z8719 Personal history of other diseases of the digestive system: Secondary | ICD-10-CM | POA: Diagnosis present

## 2017-11-17 DIAGNOSIS — I959 Hypotension, unspecified: Secondary | ICD-10-CM | POA: Diagnosis not present

## 2017-11-17 HISTORY — PX: LYSIS OF ADHESION: SHX5961

## 2017-11-17 HISTORY — PX: LAPAROSCOPY: SHX197

## 2017-11-17 LAB — I-STAT CHEM 8, ED
BUN: 15 mg/dL (ref 8–23)
CHLORIDE: 103 mmol/L (ref 98–111)
CREATININE: 0.8 mg/dL (ref 0.44–1.00)
Calcium, Ion: 1.07 mmol/L — ABNORMAL LOW (ref 1.15–1.40)
Glucose, Bld: 151 mg/dL — ABNORMAL HIGH (ref 70–99)
HCT: 40 % (ref 36.0–46.0)
Hemoglobin: 13.6 g/dL (ref 12.0–15.0)
POTASSIUM: 3.8 mmol/L (ref 3.5–5.1)
SODIUM: 138 mmol/L (ref 135–145)
TCO2: 23 mmol/L (ref 22–32)

## 2017-11-17 LAB — CBC WITH DIFFERENTIAL/PLATELET
ABS IMMATURE GRANULOCYTES: 0.06 10*3/uL (ref 0.00–0.07)
BASOS ABS: 0 10*3/uL (ref 0.0–0.1)
Basophils Relative: 0 %
EOS PCT: 1 %
Eosinophils Absolute: 0.1 10*3/uL (ref 0.0–0.5)
HEMATOCRIT: 42.7 % (ref 36.0–46.0)
HEMOGLOBIN: 13.8 g/dL (ref 12.0–15.0)
IMMATURE GRANULOCYTES: 1 %
LYMPHS ABS: 2.6 10*3/uL (ref 0.7–4.0)
LYMPHS PCT: 26 %
MCH: 26 pg (ref 26.0–34.0)
MCHC: 32.3 g/dL (ref 30.0–36.0)
MCV: 80.6 fL (ref 80.0–100.0)
Monocytes Absolute: 0.6 10*3/uL (ref 0.1–1.0)
Monocytes Relative: 6 %
NEUTROS ABS: 6.6 10*3/uL (ref 1.7–7.7)
NEUTROS PCT: 66 %
NRBC: 0 % (ref 0.0–0.2)
Platelets: 265 10*3/uL (ref 150–400)
RBC: 5.3 MIL/uL — ABNORMAL HIGH (ref 3.87–5.11)
RDW: 13.4 % (ref 11.5–15.5)
WBC: 10 10*3/uL (ref 4.0–10.5)

## 2017-11-17 LAB — COMPREHENSIVE METABOLIC PANEL
ALT: 34 U/L (ref 0–44)
AST: 49 U/L — ABNORMAL HIGH (ref 15–41)
Albumin: 3.9 g/dL (ref 3.5–5.0)
Alkaline Phosphatase: 91 U/L (ref 38–126)
Anion gap: 13 (ref 5–15)
BUN: 14 mg/dL (ref 8–23)
CHLORIDE: 103 mmol/L (ref 98–111)
CO2: 21 mmol/L — AB (ref 22–32)
Calcium: 9 mg/dL (ref 8.9–10.3)
Creatinine, Ser: 0.92 mg/dL (ref 0.44–1.00)
GFR calc non Af Amer: >60 mL/min (ref >60)
Glucose, Bld: 154 mg/dL — ABNORMAL HIGH (ref 70–99)
POTASSIUM: 3.8 mmol/L (ref 3.5–5.1)
SODIUM: 137 mmol/L (ref 135–145)
Total Bilirubin: 0.5 mg/dL (ref 0.3–1.2)
Total Protein: 7.2 g/dL (ref 6.5–8.1)

## 2017-11-17 LAB — I-STAT BETA HCG BLOOD, ED (MC, WL, AP ONLY): HCG, QUANTITATIVE: 5.6 m[IU]/mL — AB (ref <5)

## 2017-11-17 LAB — I-STAT CG4 LACTIC ACID, ED: Lactic Acid, Venous: 5.06 mmol/L (ref 0.5–1.9)

## 2017-11-17 LAB — PROTIME-INR
INR: 1.06
Prothrombin Time: 13.7 seconds (ref 11.4–15.2)

## 2017-11-17 LAB — I-STAT TROPONIN, ED: TROPONIN I, POC: 0 ng/mL (ref 0.00–0.08)

## 2017-11-17 LAB — TYPE AND SCREEN
ABO/RH(D): O POS
Antibody Screen: NEGATIVE

## 2017-11-17 LAB — ABO/RH: ABO/RH(D): O POS

## 2017-11-17 LAB — LIPASE, BLOOD: Lipase: 35 U/L (ref 11–51)

## 2017-11-17 SURGERY — LAPAROSCOPY, DIAGNOSTIC
Anesthesia: General | Site: Abdomen

## 2017-11-17 MED ORDER — ONDANSETRON HCL 4 MG/2ML IJ SOLN
4.0000 mg | Freq: Once | INTRAMUSCULAR | Status: AC
  Administered 2017-11-17: 4 mg via INTRAVENOUS
  Filled 2017-11-17: qty 2

## 2017-11-17 MED ORDER — PROPOFOL 10 MG/ML IV BOLUS
INTRAVENOUS | Status: AC
  Filled 2017-11-17: qty 20

## 2017-11-17 MED ORDER — ROCURONIUM BROMIDE 100 MG/10ML IV SOLN
INTRAVENOUS | Status: DC | PRN
  Administered 2017-11-17: 30 mg via INTRAVENOUS

## 2017-11-17 MED ORDER — SUCCINYLCHOLINE CHLORIDE 20 MG/ML IJ SOLN
INTRAMUSCULAR | Status: DC | PRN
  Administered 2017-11-17: 100 mg via INTRAVENOUS

## 2017-11-17 MED ORDER — ONDANSETRON HCL 4 MG/2ML IJ SOLN
INTRAMUSCULAR | Status: DC | PRN
  Administered 2017-11-17: 4 mg via INTRAVENOUS

## 2017-11-17 MED ORDER — LIDOCAINE HCL (CARDIAC) PF 100 MG/5ML IV SOSY
PREFILLED_SYRINGE | INTRAVENOUS | Status: DC | PRN
  Administered 2017-11-17 (×2): 10 mg via INTRATRACHEAL

## 2017-11-17 MED ORDER — MEPERIDINE HCL 50 MG/ML IJ SOLN: 6.2500 mg | INTRAMUSCULAR | Status: DC | PRN

## 2017-11-17 MED ORDER — SODIUM CHLORIDE 0.9 % IR SOLN
Status: DC | PRN
  Administered 2017-11-17: 1000 mL

## 2017-11-17 MED ORDER — FENTANYL CITRATE (PF) 100 MCG/2ML IJ SOLN
50.0000 ug | Freq: Once | INTRAMUSCULAR | Status: AC
  Administered 2017-11-17: 50 ug via INTRAVENOUS
  Filled 2017-11-17: qty 2

## 2017-11-17 MED ORDER — IOPAMIDOL (ISOVUE-370) INJECTION 76%
100.0000 mL | Freq: Once | INTRAVENOUS | Status: AC | PRN
  Administered 2017-11-17: 100 mL via INTRAVENOUS

## 2017-11-17 MED ORDER — FENTANYL CITRATE (PF) 100 MCG/2ML IJ SOLN
50.0000 ug | Freq: Once | INTRAMUSCULAR | Status: AC
  Administered 2017-11-17: 50 ug via INTRAVENOUS

## 2017-11-17 MED ORDER — SODIUM CHLORIDE 0.9 % IV SOLN
INTRAVENOUS | Status: DC | PRN
  Administered 2017-11-17: 23:00:00 via INTRAVENOUS

## 2017-11-17 MED ORDER — FENTANYL CITRATE (PF) 100 MCG/2ML IJ SOLN: 25.0000 ug | INTRAMUSCULAR | Status: DC | PRN

## 2017-11-17 MED ORDER — CEFAZOLIN SODIUM-DEXTROSE 2-3 GM-%(50ML) IV SOLR
INTRAVENOUS | Status: DC | PRN
  Administered 2017-11-17: 2 g via INTRAVENOUS

## 2017-11-17 MED ORDER — BUPIVACAINE-EPINEPHRINE 0.5% -1:200000 IJ SOLN
INTRAMUSCULAR | Status: AC
  Filled 2017-11-17: qty 1

## 2017-11-17 MED ORDER — HYDROMORPHONE HCL 1 MG/ML IJ SOLN
0.5000 mg | Freq: Once | INTRAMUSCULAR | Status: AC
  Administered 2017-11-17: 0.5 mg via INTRAVENOUS
  Filled 2017-11-17: qty 1

## 2017-11-17 MED ORDER — BUPIVACAINE-EPINEPHRINE 0.5% -1:200000 IJ SOLN
INTRAMUSCULAR | Status: DC | PRN
  Administered 2017-11-17: 8 mL

## 2017-11-17 MED ORDER — SCOPOLAMINE 1 MG/3DAYS TD PT72
MEDICATED_PATCH | TRANSDERMAL | Status: DC | PRN
  Administered 2017-11-17: 1 via TRANSDERMAL

## 2017-11-17 MED ORDER — SUFENTANIL CITRATE 50 MCG/ML IV SOLN
INTRAVENOUS | Status: AC
  Filled 2017-11-17: qty 1

## 2017-11-17 MED ORDER — HYDROMORPHONE HCL 1 MG/ML IJ SOLN
0.5000 mg | Freq: Once | INTRAMUSCULAR | Status: AC
  Administered 2017-11-17: 0.5 mg via INTRAVENOUS

## 2017-11-17 MED ORDER — PROPOFOL 10 MG/ML IV BOLUS
INTRAVENOUS | Status: DC | PRN
  Administered 2017-11-17: 50 mg via INTRAVENOUS
  Administered 2017-11-17: 100 mg via INTRAVENOUS

## 2017-11-17 MED ORDER — CEFAZOLIN SODIUM-DEXTROSE 2-4 GM/100ML-% IV SOLN
INTRAVENOUS | Status: AC
  Filled 2017-11-17: qty 100

## 2017-11-17 MED ORDER — MIDAZOLAM HCL 5 MG/5ML IJ SOLN
INTRAMUSCULAR | Status: DC | PRN
  Administered 2017-11-17: 2 mg via INTRAVENOUS

## 2017-11-17 MED ORDER — LIDOCAINE HCL URETHRAL/MUCOSAL 2 % EX GEL
1.0000 "application " | Freq: Once | CUTANEOUS | Status: DC
  Filled 2017-11-17: qty 20

## 2017-11-17 MED ORDER — LACTATED RINGERS IV SOLN
INTRAVENOUS | Status: DC | PRN
  Administered 2017-11-17: 22:00:00 via INTRAVENOUS

## 2017-11-17 MED ORDER — SCOPOLAMINE 1 MG/3DAYS TD PT72
MEDICATED_PATCH | TRANSDERMAL | Status: AC
  Filled 2017-11-17: qty 1

## 2017-11-17 MED ORDER — PHENYLEPHRINE HCL 10 MG/ML IJ SOLN: INTRAMUSCULAR | Status: DC | PRN

## 2017-11-17 MED ORDER — EPHEDRINE SULFATE 50 MG/ML IJ SOLN
INTRAMUSCULAR | Status: DC | PRN
  Administered 2017-11-17 (×3): 10 mg via INTRAVENOUS

## 2017-11-17 MED ORDER — SUGAMMADEX SODIUM 200 MG/2ML IV SOLN
INTRAVENOUS | Status: DC | PRN
  Administered 2017-11-17: 200 mg via INTRAVENOUS

## 2017-11-17 MED ORDER — PROMETHAZINE HCL 25 MG/ML IJ SOLN: 6.2500 mg | INTRAMUSCULAR | Status: DC | PRN

## 2017-11-17 MED ORDER — MIDAZOLAM HCL 2 MG/2ML IJ SOLN: 0.5000 mg | Freq: Once | INTRAMUSCULAR | Status: DC | PRN

## 2017-11-17 MED ORDER — MIDAZOLAM HCL 2 MG/2ML IJ SOLN
INTRAMUSCULAR | Status: AC
  Filled 2017-11-17: qty 2

## 2017-11-17 MED ORDER — SODIUM CHLORIDE 0.9 % IV BOLUS
1000.0000 mL | Freq: Once | INTRAVENOUS | Status: AC
  Administered 2017-11-17: 1000 mL via INTRAVENOUS

## 2017-11-17 MED ORDER — DEXAMETHASONE SODIUM PHOSPHATE 10 MG/ML IJ SOLN
INTRAMUSCULAR | Status: DC | PRN
  Administered 2017-11-17: 10 mg via INTRAVENOUS

## 2017-11-17 SURGICAL SUPPLY — 49 items
ADH SKN CLS APL DERMABOND .7 (GAUZE/BANDAGES/DRESSINGS) ×1
APL SKNCLS STERI-STRIP NONHPOA (GAUZE/BANDAGES/DRESSINGS) ×1
APPLIER CLIP ROT 10 11.4 M/L (STAPLE)
APR CLP MED LRG 11.4X10 (STAPLE)
BAG SPEC RTRVL 10 TROC 200 (ENDOMECHANICALS) ×1
BENZOIN TINCTURE PRP APPL 2/3 (GAUZE/BANDAGES/DRESSINGS) ×3 IMPLANT
BLADE CLIPPER SURG (BLADE) IMPLANT
CANISTER SUCT 3000ML PPV (MISCELLANEOUS) IMPLANT
CHLORAPREP W/TINT 26ML (MISCELLANEOUS) ×3 IMPLANT
CLIP APPLIE ROT 10 11.4 M/L (STAPLE) IMPLANT
COVER SURGICAL LIGHT HANDLE (MISCELLANEOUS) ×3 IMPLANT
COVER WAND RF STERILE (DRAPES) ×3 IMPLANT
CUTTER FLEX LINEAR 45M (STAPLE) IMPLANT
DERMABOND ADVANCED (GAUZE/BANDAGES/DRESSINGS) ×2
DERMABOND ADVANCED .7 DNX12 (GAUZE/BANDAGES/DRESSINGS) IMPLANT
DRAPE LAPAROSCOPIC ABDOMINAL (DRAPES) ×3 IMPLANT
DRAPE WARM FLUID 44X44 (DRAPE) ×3 IMPLANT
ELECT REM PT RETURN 9FT ADLT (ELECTROSURGICAL) ×3
ELECTRODE REM PT RTRN 9FT ADLT (ELECTROSURGICAL) ×1 IMPLANT
ENDOLOOP SUT PDS II  0 18 (SUTURE)
ENDOLOOP SUT PDS II 0 18 (SUTURE) IMPLANT
GLOVE BIOGEL PI IND STRL 8 (GLOVE) ×1 IMPLANT
GLOVE BIOGEL PI INDICATOR 8 (GLOVE) ×2
GLOVE ECLIPSE 7.5 STRL STRAW (GLOVE) ×3 IMPLANT
GOWN STRL REUS W/ TWL LRG LVL3 (GOWN DISPOSABLE) ×2 IMPLANT
GOWN STRL REUS W/ TWL XL LVL3 (GOWN DISPOSABLE) ×1 IMPLANT
GOWN STRL REUS W/TWL LRG LVL3 (GOWN DISPOSABLE) ×6
GOWN STRL REUS W/TWL XL LVL3 (GOWN DISPOSABLE) ×3
KIT BASIN OR (CUSTOM PROCEDURE TRAY) ×3 IMPLANT
KIT TURNOVER KIT B (KITS) ×3 IMPLANT
NDL INSUFFLATION 14GA 120MM (NEEDLE) IMPLANT
NEEDLE INSUFFLATION 14GA 120MM (NEEDLE) IMPLANT
NS IRRIG 1000ML POUR BTL (IV SOLUTION) ×3 IMPLANT
PAD ARMBOARD 7.5X6 YLW CONV (MISCELLANEOUS) ×6 IMPLANT
POUCH RETRIEVAL ECOSAC 10 (ENDOMECHANICALS) ×1 IMPLANT
POUCH RETRIEVAL ECOSAC 10MM (ENDOMECHANICALS) ×2
RELOAD STAPLE 45 3.5 BLU ETS (ENDOMECHANICALS) IMPLANT
RELOAD STAPLE TA45 3.5 REG BLU (ENDOMECHANICALS) IMPLANT
SET IRRIG TUBING LAPAROSCOPIC (IRRIGATION / IRRIGATOR) IMPLANT
SLEEVE ENDOPATH XCEL 5M (ENDOMECHANICALS) ×3 IMPLANT
SPECIMEN JAR SMALL (MISCELLANEOUS) ×3 IMPLANT
SUT MON AB 5-0 PS2 18 (SUTURE) ×3 IMPLANT
TOWEL OR 17X24 6PK STRL BLUE (TOWEL DISPOSABLE) ×3 IMPLANT
TOWEL OR 17X26 10 PK STRL BLUE (TOWEL DISPOSABLE) ×3 IMPLANT
TRAY FOLEY MTR SLVR 14FR STAT (SET/KITS/TRAYS/PACK) IMPLANT
TRAY LAPAROSCOPIC MC (CUSTOM PROCEDURE TRAY) ×3 IMPLANT
TROCAR XCEL 12X100 BLDLESS (ENDOMECHANICALS) ×3 IMPLANT
TROCAR XCEL NON-BLD 5MMX100MML (ENDOMECHANICALS) ×5 IMPLANT
TUBING INSUFFLATION (TUBING) ×3 IMPLANT

## 2017-11-17 NOTE — H&P (Signed)
Denise Rice is an 67 y.o. female.   Chief Complaint: abdominal pain HPI:  Pt is a 66 yo F who presented to ED by EMS for severe acute onset abdominal pain.  She was finishing up a meeting and felt like she was being stabbed with a knife.  She said it was more severe that almost anything she had experienced.  She got very lightheaded and sweaty.  She threw up bilious foamy liquid.  She has passed gas in the last few hours.    She is s/p lap band by Dr. Lucia Gaskins in 2009 which was removed and converted to a gastric bypass by Dr. Alvan Dame at [facility] in 2013.  She has not ever had problems with the bypass before.    Past Medical History:  Diagnosis Date  . Anal condyloma   . Deafness in left ear   . Hearing loss, sensorineural, high frequency    RIGHT EAR  . 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 Last eruption 3/12  . History of peptic ulcer   . Otosclerosis of left ear   . PCO (polycystic ovaries)   . PCOS (polycystic ovarian syndrome) 03/11/2017  . PONV (postoperative nausea and vomiting)   . 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 Wears hearing aid    both ears    Past Surgical History:  Procedure Laterality Date  . BLEPHAROPLASTY  03-05-2000  . CRANIOTOMY  08/2014   to repair dehescence in right ear  . HAMMER TOE SURGERY Left 08-29-2013   2nd toe  . IMPLANTATION BONE ANCHORED HEARING AID Left 07/27/2008   left temporal bone;now removed  . LAPAROSCOPIC CHOLECYSTECTOMY  03-31-2005  . LAPAROSCOPIC GASTRIC BANDING  02-08-2007  . LASER ABLATION CONDOLAMATA N/A 07/30/2012   Procedure: LASER ABLATION CONDOLAMATA;  Surgeon: Leighton Ruff, MD;  Location: [facility];  Service: General;  Laterality: N/A;  . LASIK Bilateral 2002  . ROUX-EN-Y GASTRIC BYPASS  AUG 2013  . [place];  1994 x2;  02-03-1998  . STRABISMUS SURGERY Right   . VAGINAL HYSTERECTOMY  2007   fibroids    Family History   Problem Relation Age of Onset  . Heart attack Mother   . Heart disease Mother   . Cancer Father        unknown primary  . Stroke Sister   . Diabetes Sister   . Diabetes Maternal Aunt   . Heart disease Maternal Aunt   . Cancer Paternal Aunt   . Cancer Paternal Uncle   . Colon cancer Neg Hx   . Colon polyps Neg Hx    Social History:  reports that she has never smoked. She has never used smokeless tobacco. She reports that she does not drink alcohol or use drugs.  Allergies:  Allergies  Allergen Reactions  . Topiramate Nausea Only   Current Meds  Medication Sig  . ALPRAZolam (XANAX) 1 MG tablet TAKE 1 TABLET BY MOUTH 3 TIMES DAILY AS NEEDED FOR ANXIETY (Patient taking differently: Take 1 mg by mouth 3 (three) times daily as needed for anxiety. )  . Biotin 5000 MCG CAPS Take 5,000 mcg by mouth daily.   . busPIRone (BUSPAR) 7.5 MG tablet TAKE 1 TABLET (7.5 MG TOTAL) BY MOUTH 3 (THREE) TIMES DAILY AS NEEDED (ANXIETY).  . Calcium Carbonate-Vitamin D (CALCIUM 600 + D PO) Take 1 tablet by mouth daily.   . cyanocobalamin 2000 MCG tablet  Take 2,000 mcg by mouth daily.  Marland Kitchen escitalopram (LEXAPRO) 20 MG tablet Take 1 tablet (20 mg total) by mouth daily.  . hydrOXYzine (ATARAX/VISTARIL) 25 MG tablet TAKE 1 TABLET (25 MG TOTAL) BY MOUTH AT BEDTIME.  Marland Kitchen ibuprofen (ADVIL,MOTRIN) 600 MG tablet Take 1 tablet (600 mg total) by mouth every 6 (six) hours as needed. (Patient taking differently: Take 600 mg by mouth every 6 (six) hours as needed for headache, mild pain or moderate pain. )  . ranitidine (ZANTAC) 150 MG capsule Take 1 capsule (150 mg total) by mouth 2 (two) times daily.  Marland Kitchen topiramate (TOPAMAX) 25 MG tablet Take 1 tablet (25 mg total) by mouth 2 (two) times daily. (Patient taking differently: Take 25 mg by mouth as needed (as directed for migraines). )  . traMADol (ULTRAM) 50 MG tablet TAKE 1 TABLET BY MOUTH EVERY 6 HOURS AS NEEDED. (Patient taking differently: Take 50 mg by mouth every 6 (six)  hours as needed (for pain). )  . zolpidem (AMBIEN) 10 MG tablet Take 1 tablet (10 mg total) by mouth at bedtime as needed. (Patient taking differently: Take 10 mg by mouth at bedtime. )     Results for orders placed or performed during the hospital encounter of 11/17/17 (from the past 48 hour(s))  Type and screen [place]     Status: None   Collection Time: 11/17/17  6:00 PM  Result Value Ref Range   ABO/RH(D) O POS    Antibody Screen NEG    Sample Expiration      11/20/2017 Performed at [facility], [place] [address], [place], [place] [address]   ABO/Rh     Status: None   Collection Time: 11/17/17  6:00 PM  Result Value Ref Range   ABO/RH(D)      O POS Performed at [facility] [address], [place], [place] [address]   Comprehensive metabolic panel     Status: Abnormal   Collection Time: 11/17/17  6:45 PM  Result Value Ref Range   Sodium 137 135 - 145 mmol/L   Potassium 3.8 3.5 - 5.1 mmol/L   Chloride 103 98 - 111 mmol/L   CO2 21 (L) 22 - 32 mmol/L   Glucose, Bld 154 (H) 70 - 99 mg/dL   BUN 14 8 - 23 mg/dL   Creatinine, Ser 0.92 0.44 - 1.00 mg/dL   Calcium 9.0 8.9 - 10.3 mg/dL   Total Protein 7.2 6.5 - 8.1 g/dL   Albumin 3.9 3.5 - 5.0 g/dL   AST 49 (H) 15 - 41 U/L   ALT 34 0 - 44 U/L   Alkaline Phosphatase 91 38 - 126 U/L   Total Bilirubin 0.5 0.3 - 1.2 mg/dL   GFR calc non Af Amer >60 >60 mL/min   GFR calc Af Amer >60 >60 mL/min    Comment: (NOTE) The eGFR has been calculated using the CKD EPI equation. This calculation has not been validated in all clinical situations. eGFR's persistently <60 mL/min signify possible Chronic Kidney Disease.    Anion gap 13 5 - 15    Comment: Performed at [facility] [address], [place],  [address]  CBC with Differential     Status: Abnormal   Collection Time: 11/17/17  6:45 PM  Result Value Ref Range   WBC 10.0 4.0 - 10.5 K/uL   RBC 5.30 (H) 3.87 - 5.11 MIL/uL   Hemoglobin  13.8 12.0 - 15.0 g/dL  HCT 42.7 36.0 - 46.0 %   MCV 80.6 80.0 - 100.0 fL   MCH 26.0 26.0 - 34.0 pg   MCHC 32.3 30.0 - 36.0 g/dL   RDW 13.4 11.5 - 15.5 %   Platelets 265 150 - 400 K/uL   nRBC 0.0 0.0 - 0.2 %   Neutrophils Relative % 66 %   Neutro Abs 6.6 1.7 - 7.7 K/uL   Lymphocytes Relative 26 %   Lymphs Abs 2.6 0.7 - 4.0 K/uL   Monocytes Relative 6 %   Monocytes Absolute 0.6 0.1 - 1.0 K/uL   Eosinophils Relative 1 %   Eosinophils Absolute 0.1 0.0 - 0.5 K/uL   Basophils Relative 0 %   Basophils Absolute 0.0 0.0 - 0.1 K/uL   Immature Granulocytes 1 %   Abs Immature Granulocytes 0.06 0.00 - 0.07 K/uL    Comment: Performed at [facility] [address], [place], [place] [address]  Protime-INR     Status: None   Collection Time: 11/17/17  6:45 PM  Result Value Ref Range   Prothrombin Time 13.7 11.4 - 15.2 seconds   INR 1.06     Comment: Performed at [facility], [place] [address], [place], [place] [address]  Lipase, blood     Status: None   Collection Time: 11/17/17  6:45 PM  Result Value Ref Range   Lipase 35 11 - 51 U/L    Comment: Performed at [facility] [address] [place], [place] [address]  I-stat troponin, ED     Status: None   Collection Time: 11/17/17  6:56 PM  Result Value Ref Range   Troponin i, poc 0.00 0.00 - 0.08 ng/mL   Comment 3            Comment: Due to the release kinetics of cTnI, a negative result within the first hours of the onset of symptoms does not rule out myocardial infarction with certainty. If myocardial infarction is still suspected, repeat the test at appropriate intervals.   I-Stat beta hCG blood, ED ([facility], [facility], [facility] only)     Status: Abnormal   Collection Time: 11/17/17  6:56 PM  Result Value Ref Range   I-stat hCG, quantitative 5.6 (H) <5 mIU/mL   Comment 3            Comment:   GEST. AGE      CONC.  (mIU/mL)   <=1 WEEK        5 - 50     2 WEEKS       50 - 500     3 WEEKS       100 - 10,000     4 WEEKS     1,000  - 30,000        FEMALE AND NON-PREGNANT FEMALE:     LESS THAN 5 mIU/mL   I-stat Chem 8, ED     Status: Abnormal   Collection Time: 11/17/17  6:57 PM  Result Value Ref Range   Sodium 138 135 - 145 mmol/L   Potassium 3.8 3.5 - 5.1 mmol/L   Chloride 103 98 - 111 mmol/L   BUN 15 8 - 23 mg/dL   Creatinine, Ser 0.80 0.44 - 1.00 mg/dL   Glucose, Bld 151 (H) 70 - 99 mg/dL   Calcium, Ion 1.07 (L) 1.15 - 1.40 mmol/L   TCO2 23 22 - 32 mmol/L   Hemoglobin 13.6 12.0 - 15.0 g/dL   HCT 40.0  36.0 - 46.0 %  I-Stat CG4 Lactic Acid, ED     Status: Abnormal   Collection Time: 11/17/17  8:17 PM  Result Value Ref Range   Lactic Acid, Venous 5.06 (HH) 0.5 - 1.9 mmol/L   Comment NOTIFIED PHYSICIAN    Dg Chest Port 1 View  Result Date: 11/17/2017 CLINICAL DATA:  Abdominal and chest pain. EXAM: PORTABLE CHEST 1 VIEW COMPARISON:  03/19/2017 chest radiograph. FINDINGS: Stable heart size and mediastinal contours are within normal limits. Both lungs are clear. The visualized skeletal structures are unremarkable. IMPRESSION: No active disease. Electronically Signed   By: Kristine Garbe M.D.   On: 11/17/2017 19:20    Review of Systems  Constitutional: Negative.   HENT: Negative.   Eyes: Negative.   Respiratory: Negative.   Cardiovascular: Negative.   Gastrointestinal: Positive for abdominal pain, nausea and vomiting. Negative for diarrhea.  Genitourinary: Negative.   Musculoskeletal: Negative.   Skin: Negative.   Neurological:       Hearing loss on left  Endo/Heme/Allergies: Negative.   Psychiatric/Behavioral: Negative.   All other systems reviewed and are negative.   Blood pressure (!) 127/92, pulse 79, resp. rate (!) 24, height '5\' 1"'  (1.549 m), weight 72.6 kg, last menstrual period 01/13/2005, SpO2 99 %. Physical Exam  Constitutional: She is oriented to person, place, and time. She appears well-developed and well-nourished. She appears distressed.  HENT:  Head: Normocephalic and  atraumatic.  Mouth/Throat: Oropharynx is clear and moist.  Eyes: Pupils are equal, round, and reactive to light. Conjunctivae are normal. Right eye exhibits no discharge. Left eye exhibits no discharge. No scleral icterus.  Neck: Normal range of motion. No thyromegaly present.  Cardiovascular: Normal rate, regular rhythm and intact distal pulses.  Respiratory: Effort normal. No respiratory distress.  GI: Soft. She exhibits distension (mildly distended). There is tenderness (mild diffuse tenderness). There is no rebound and no guarding.  Musculoskeletal: She exhibits no edema, tenderness or deformity.  Neurological: She is alert and oriented to person, place, and time. A cranial nerve deficit (decreased hearing both ears) is present.  Skin: Skin is warm and dry. She is not diaphoretic.  Psychiatric: She has a normal mood and affect. Her behavior is normal. Judgment and thought content normal.     Assessment/Plan Small bowel obstruction S/p laparoscopic gastric bypass.  Concern for internal hernia given appearance of mesentery on CT scan as well as symptomatology.    Iv fluid resuscitation. NPO  To OR for dx laparoscopy.  Due to Dr. Excell Seltzer bariatric expertise, he will lead the surgery and I will assist.    Risks of surgery discussed with patient.    Stark Klein, MD 11/17/2017, 10:02 PM

## 2017-11-17 NOTE — ED Triage Notes (Signed)
Per EMS pt developed central abdomnial that radiated to back at 1600.  Last BP with EMS was 89/56 pt is sweating and complains of pain 10/10

## 2017-11-17 NOTE — Transfer of Care (Signed)
Immediate Anesthesia Transfer of Care Note  Patient: Denise Rice  Procedure(s) Performed: LAPAROSCOPY LYSIS OF ADHESIONS FOR SMALL BOWEL OBSTRUCTION (N/A Abdomen) LYSIS OF ADHESION (N/A Abdomen)  Patient Location: PACU  Anesthesia Type:General  Level of Consciousness: awake, alert , oriented, drowsy and patient cooperative  Airway & Oxygen Therapy: Patient Spontanous Breathing and Patient connected to nasal cannula oxygen  Post-op Assessment: Report given to RN, Post -op Vital signs reviewed and stable and Patient moving all extremities X 4  Post vital signs: Reviewed and stable  Last Vitals:  Vitals Value Taken Time  BP 104/55 11/17/2017 11:40 PM  Temp    Pulse 95 11/17/2017 11:43 PM  Resp 15 11/17/2017 11:43 PM  SpO2 100 % 11/17/2017 11:43 PM  Vitals shown include unvalidated device data.  Last Pain:  Vitals:   11/17/17 2339  PainSc: 1          Complications: No apparent anesthesia complications

## 2017-11-17 NOTE — ED Provider Notes (Signed)
[facility] EMERGENCY DEPARTMENT Provider Note   CSN: [id number] Arrival date & time: 11/17/17  4967     History   Chief Complaint Chief Complaint  Patient presents with  . Abdominal Pain    HPI Denise Rice is a 67 y.o. female.  The history is provided by the patient. No language interpreter was used.   Denise Rice is a 67 y.o. female who presents to the Emergency Department complaining of abdominal pain. Presents to the emergency department for evaluation of acute onset severe, constant abdominal pain. It is described as sharp and stabbing in nature. It radiates to her back chest. She has associated nausea and dry heaves. No fevers. No diarrhea. No prior similar symptoms. She has a history of gastric bypass and cholecystectomy. No recent illnesses.  Denies hematochezia, melana.    Past Medical History:  Diagnosis Date  . Anal condyloma   . Deafness in left ear   . Hearing loss, sensorineural, high frequency    RIGHT EAR  . 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 Last eruption 3/12  . History of peptic ulcer   . Otosclerosis of left ear   . PCO (polycystic ovaries)   . PCOS (polycystic ovarian syndrome) 03/11/2017  . PONV (postoperative nausea and vomiting)   . 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 Wears hearing aid    both ears    Patient Active Problem List   Diagnosis Date Noted  . Small bowel obstruction ([place]) 11/17/2017  . Chronic vertigo 03/11/2017  . Major depression, chronic 03/11/2017  . PCOS (polycystic ovarian syndrome) 03/11/2017  . Insomnia 09/08/2015  . Bilateral sensorineural hearing loss 09/06/2014  . 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 History of laparoscopic adjustable gastric banding 01/22/2011  . Vitamin Denise deficiency 01/15/2008    Past Surgical History:  Procedure Laterality Date  .  BLEPHAROPLASTY  03-05-2000  . CRANIOTOMY  08/2014   to repair dehescence in right ear  . HAMMER TOE SURGERY Left 08-29-2013   2nd toe  . IMPLANTATION BONE ANCHORED HEARING AID Left 07/27/2008   left temporal bone;now removed  . LAPAROSCOPIC CHOLECYSTECTOMY  03-31-2005  . LAPAROSCOPIC GASTRIC BANDING  02-08-2007  . LASER ABLATION CONDOLAMATA N/A 07/30/2012   Procedure: LASER ABLATION CONDOLAMATA;  Surgeon: Leighton Ruff, MD;  Location: [facility];  Service: General;  Laterality: N/A;  . LASIK Bilateral 2002  . ROUX-EN-Y GASTRIC BYPASS  AUG 2013  . [place];  1994 x2;  02-03-1998  . STRABISMUS SURGERY Right   . VAGINAL HYSTERECTOMY  2007   fibroids     OB History    Gravida  0   Para      Term      Preterm      AB      Living        SAB      TAB      Ectopic      Multiple      Live Births               Home Medications    Prior to Admission medications   Medication Sig Start Date End Date Taking? Authorizing Provider  ALPRAZolam (XANAX) 1 MG tablet TAKE 1 TABLET BY MOUTH 3 TIMES DAILY AS NEEDED FOR ANXIETY Patient taking differently: Take 1 mg by mouth 3 (  three) times daily as needed for anxiety.  09/22/17  Yes [name], MD  Biotin 5000 MCG CAPS Take 5,000 mcg by mouth daily.    Yes [provider]  busPIRone (BUSPAR) 7.5 MG tablet TAKE 1 TABLET (7.5 MG TOTAL) BY MOUTH 3 (THREE) TIMES DAILY AS NEEDED (ANXIETY). 11/16/17  Yes [name], MD  Calcium Carbonate-Vitamin Denise (CALCIUM 600 + Denise PO) Take 1 tablet by mouth daily.    Yes [provider]  cyanocobalamin 2000 MCG tablet Take 2,000 mcg by mouth daily.   Yes [provider]  escitalopram (LEXAPRO) 20 MG tablet Take 1 tablet (20 mg total) by mouth daily. 08/27/17  Yes [name], MD  hydrOXYzine (ATARAX/VISTARIL) 25 MG tablet TAKE 1 TABLET (25 MG TOTAL) BY MOUTH AT BEDTIME. 10/15/17  Yes [name], MD  ibuprofen (ADVIL,MOTRIN) 600  MG tablet Take 1 tablet (600 mg total) by mouth every 6 (six) hours as needed. Patient taking differently: Take 600 mg by mouth every 6 (six) hours as needed for headache, mild pain or moderate pain.  06/22/14  Yes Varney Biles, MD  ranitidine (ZANTAC) 150 MG capsule Take 1 capsule (150 mg total) by mouth 2 (two) times daily. 09/24/17  Yes Megan Salon, MD  topiramate (TOPAMAX) 25 MG tablet Take 1 tablet (25 mg total) by mouth 2 (two) times daily. Patient taking differently: Take 25 mg by mouth as needed (as directed for migraines).  12/24/16  Yes Melvenia Beam, MD  traMADol (ULTRAM) 50 MG tablet TAKE 1 TABLET BY MOUTH EVERY 6 HOURS AS NEEDED. Patient taking differently: Take 50 mg by mouth every 6 (six) hours as needed (for pain).  10/15/17  Yes Melvenia Beam, MD  zolpidem (AMBIEN) 10 MG tablet Take 1 tablet (10 mg total) by mouth at bedtime as needed. Patient taking differently: Take 10 mg by mouth at bedtime.  07/27/17  Yes [name], MD  ferrous sulfate 325 (65 FE) MG tablet Take 325 mg by mouth daily with breakfast.    [provider]    Family History Family History  Problem Relation Age of Onset  . Heart attack Mother   . Heart disease Mother   . Cancer Father        unknown primary  . Stroke Sister   . Diabetes Sister   . Diabetes Maternal Aunt   . Heart disease Maternal Aunt   . Cancer Paternal Aunt   . Cancer Paternal Uncle   . Colon cancer Neg Hx   . Colon polyps Neg Hx     Social History Social History   Tobacco Use  . Smoking status: Never Smoker  . Smokeless tobacco: Never Used  Substance Use Topics  . Alcohol use: No    Alcohol/week: 0.0 standard drinks  . Drug use: No     Allergies   Topiramate   Review of Systems Review of Systems  All other systems reviewed and are negative.    Physical Exam Updated Vital Signs BP 117/70 (BP Location: Left Arm)   Pulse 84   Temp 98 F (36.7 C) (Oral)   Resp 16   Ht 5\' 1"  (1.549 m)   Wt  72.6 kg   LMP 01/13/2005   SpO2 99%   BMI 30.23 kg/m   Physical Exam  Constitutional: She is oriented to person, place, and time. She appears well-developed and well-nourished. She appears distressed.  HENT:  Head: Normocephalic and atraumatic.  Cardiovascular: Normal  rate and regular rhythm.  No murmur heard. Pulmonary/Chest: Effort normal and breath sounds normal. No respiratory distress.  Abdominal:  Distended abdomen, generalized tenderness with voluntary guarding and rebound  Musculoskeletal: She exhibits no edema or tenderness.  Neurological: She is alert and oriented to person, place, and time.  Skin: Skin is warm. She is diaphoretic. There is pallor.  Psychiatric: She has a normal mood and affect. Her behavior is normal.  Nursing note and vitals reviewed.    ED Treatments / Results  Labs (all labs ordered are listed, but only abnormal results are displayed) Labs Reviewed  COMPREHENSIVE METABOLIC PANEL - Abnormal; Notable for the following components:      Result Value   CO2 21 (*)    Glucose, Bld 154 (*)    AST 49 (*)    All other components within normal limits  CBC WITH DIFFERENTIAL/PLATELET - Abnormal; Notable for the following components:   RBC 5.30 (*)    All other components within normal limits  I-STAT CHEM 8, ED - Abnormal; Notable for the following components:   Glucose, Bld 151 (*)    Calcium, Ion 1.07 (*)    All other components within normal limits  I-STAT CG4 LACTIC ACID, ED - Abnormal; Notable for the following components:   Lactic Acid, Venous 5.06 (*)    All other components within normal limits  I-STAT BETA HCG BLOOD, ED ([facility], [facility], [facility] ONLY) - Abnormal; Notable for the following components:   I-stat hCG, quantitative 5.6 (*)    All other components within normal limits  PROTIME-INR  LIPASE, BLOOD  CBC  BASIC METABOLIC PANEL  CBC  CREATININE, SERUM  I-STAT TROPONIN, ED  TYPE AND SCREEN  ABO/RH    EKG EKG  Interpretation  Date/Time:  Tuesday November 17 2017 19:00:53 EST Ventricular Rate:  69 PR Interval:    QRS Duration: 99 QT Interval:  470 QTC Calculation: 504 R Axis:   2 Text Interpretation:  Sinus rhythm Atrial premature complex Low voltage, precordial leads Prolonged QT interval Baseline wander in lead(s) V3 Confirmed by Quintella Reichert [phone]) on 11/17/2017 7:14:29 PM   Radiology Dg Chest Port 1 View  Result Date: 11/17/2017 CLINICAL DATA:  Abdominal and chest pain. EXAM: PORTABLE CHEST 1 VIEW COMPARISON:  03/19/2017 chest radiograph. FINDINGS: Stable heart size and mediastinal contours are within normal limits. Both lungs are clear. The visualized skeletal structures are unremarkable. IMPRESSION: No active disease. Electronically Signed   By: Kristine Garbe M.Denise.   On: 11/17/2017 19:20   Ct Angio Chest/abd/pel For Dissection W And/or W/wo  Result Date: 11/17/2017 CLINICAL DATA:  Initial evaluation for acute centralized abdominal pain radiating to back. Diaphoresis. EXAM: CT ANGIOGRAPHY CHEST, ABDOMEN AND PELVIS TECHNIQUE: Multidetector CT imaging through the chest, abdomen and pelvis was performed using the standard protocol during bolus administration of intravenous contrast. Multiplanar reconstructed images and MIPs were obtained and reviewed to evaluate the vascular anatomy. CONTRAST:  165mL ISOVUE-370 IOPAMIDOL (ISOVUE-370) INJECTION 76% COMPARISON:  Prior radiograph from earlier the same day. FINDINGS: CTA CHEST FINDINGS Cardiovascular: Precontrast imaging through the intrathoracic aorta demonstrates no mural thrombus or other acute abnormality. Minimal plaque for age within the aortic arch. Postcontrast imaging demonstrates no evidence for dissection or aneurysm. Aberrant right subclavian artery noted. Visualized great vessels intact and normal. Right vertebral artery arises from the right common carotid artery. Heart size within normal limits. No pericardial effusion. Limited  evaluation of the pulmonary arterial tree unremarkable. Mediastinum/Nodes: Subcentimeter hypodense nodule noted within the  left lobe of thyroid, of doubtful significance. Thyroid otherwise unremarkable. No enlarged mediastinal, hilar, or axillary lymph nodes identified. Esophagus within normal limits. Lungs/Pleura: Tracheobronchial tree widely patent and intact. Lungs well inflated bilaterally. No focal infiltrates. No pulmonary edema or pleural effusion. No pneumothorax. No worrisome pulmonary nodule or mass. Musculoskeletal: External soft tissues demonstrate no acute finding. Osseous structures within normal limits. No discrete lytic or blastic osseous lesions. Review of the MIP images confirms the above findings. CTA ABDOMEN AND PELVIS FINDINGS VASCULAR Aorta: Normal intravascular enhancement seen throughout the intra-abdominal aorta. No evidence for dissection or aneurysm. Celiac: Patent without abnormality. SMA: Patent without abnormality. Renals: Single renal arteries present bilaterally, both of which are patent. Atheromatous plaque at the proximal left renal artery with mild to moderate stenosis (up to approximately 50%). IMA: Patent without abnormality. Inflow: Widely patent without significant stenosis or other abnormality. Veins: No acute venous abnormality identified. Review of the MIP images confirms the above findings. NON-VASCULAR Hepatobiliary: 2 cm cyst present within the inferior right hepatic lobe. Liver otherwise unremarkable. Gallbladder surgically absent. No biliary dilatation. Pancreas: Pancreas within normal limits. Spleen: Spleen within normal limits. Adrenals/Urinary Tract: Adrenal glands are normal. Kidneys equal in size with symmetric enhancement. Multiple scattered renal cysts noted bilaterally. No nephrolithiasis, hydronephrosis, or focal enhancing renal mass. No appreciable hydroureter. Partially distended bladder within normal limits. Stomach/Bowel: Sequelae of prior gastric bypass  with small hiatal hernia noted. Stomach otherwise unremarkable. Multiple prominent and mildly dilated loops of small bowel seen within the upper and mid abdomen. These measure up to approximately 3.4 cm, with associated internal air-fluid levels. Loops of bowel somewhat gradually taper to a focal transition point within the mid abdomen (series 8, image 192). Ileum decompressed distally. Mild fecalization proximally. Findings suspicious for mechanical small bowel obstruction. Moderate stool within the distal colon which is otherwise unremarkable. Appendix within normal limits. Lymphatic: No adenopathy. Reproductive: Uterus is absent.  Ovaries within normal limits. Other: No free air or fluid. Musculoskeletal: No acute osseous abnormality. No lytic or blastic osseous lesions. Review of the MIP images confirms the above findings. IMPRESSION: 1. No CT evidence for aortic dissection or other acute aortic pathology. No aneurysm. 2. Multiple dilated loops of small bowel clustered within the upper and mid abdomen with apparent transition point within the mid abdomen as above, concerning for small bowel obstruction. Underlying adhesive disease is suspected. 3. No other acute abnormality within the chest, abdomen, and pelvis. 4. Sequelae of prior gastric bypass, cholecystectomy, and hysterectomy. 5. Aberrant right subclavian artery. Electronically Signed   By: Jeannine Boga M.Denise.   On: 11/17/2017 20:17    Procedures Procedures (including critical care time) CRITICAL CARE Performed by: Quintella Reichert   Total critical care time: 40 minutes  Critical care time was exclusive of separately billable procedures and treating other patients.  Critical care was necessary to treat or prevent imminent or life-threatening deterioration.  Critical care was time spent personally by me on the following activities: development of treatment plan with patient and/or surrogate as well as nursing, discussions with  consultants, evaluation of patient's response to treatment, examination of patient, obtaining history from patient or surrogate, ordering and performing treatments and interventions, ordering and review of laboratory studies, ordering and review of radiographic studies, pulse oximetry and re-evaluation of patient's condition.  Medications Ordered in ED Medications  ceFAZolin (ANCEF) 2-4 GM/100ML-% IVPB (has no administration in time range)  ALPRAZolam (XANAX) tablet 1 mg (has no administration in time range)  busPIRone (BUSPAR) tablet  7.5 mg (has no administration in time range)  escitalopram (LEXAPRO) tablet 20 mg (has no administration in time range)  hydrOXYzine (ATARAX/VISTARIL) tablet 25 mg (has no administration in time range)  famotidine (PEPCID) tablet 20 mg (has no administration in time range)  topiramate (TOPAMAX) tablet 25 mg (has no administration in time range)  traMADol (ULTRAM) tablet 50 mg (has no administration in time range)  zolpidem (AMBIEN) tablet 5 mg (has no administration in time range)  enoxaparin (LOVENOX) injection 40 mg (has no administration in time range)  dextrose 5% in lactated ringers with KCl 20 mEq/L infusion (has no administration in time range)  ceFAZolin (ANCEF) IVPB 2g/100 mL premix (has no administration in time range)  acetaminophen (TYLENOL) tablet 650 mg (has no administration in time range)    Or  acetaminophen (TYLENOL) suppository 650 mg (has no administration in time range)  gabapentin (NEURONTIN) capsule 300 mg (has no administration in time range)  ketorolac (TORADOL) 15 MG/ML injection 15 mg (has no administration in time range)    Followed by  ketorolac (TORADOL) 15 MG/ML injection 15 mg (has no administration in time range)  morphine 2 MG/ML injection 1-2 mg (has no administration in time range)  diphenhydrAMINE (BENADRYL) 12.5 MG/5ML elixir 12.5 mg (has no administration in time range)    Or  diphenhydrAMINE (BENADRYL) injection 12.5 mg  (has no administration in time range)  ondansetron (ZOFRAN-ODT) disintegrating tablet 4 mg (has no administration in time range)    Or  ondansetron (ZOFRAN) injection 4 mg (has no administration in time range)  simethicone (MYLICON) chewable tablet 40 mg (has no administration in time range)  metoprolol tartrate (LOPRESSOR) injection 5 mg (has no administration in time range)  methocarbamol (ROBAXIN) 500 mg in dextrose 5 % 50 mL IVPB (has no administration in time range)  sodium chloride 0.9 % bolus 1,000 mL (0 mLs Intravenous Stopped 11/17/17 1928)  fentaNYL (SUBLIMAZE) injection 50 mcg (50 mcg Intravenous Given 11/17/17 1847)  ondansetron (ZOFRAN) injection 4 mg (4 mg Intravenous Given 11/17/17 1846)  fentaNYL (SUBLIMAZE) injection 50 mcg (50 mcg Intravenous Given 11/17/17 1901)  iopamidol (ISOVUE-370) 76 % injection 100 mL (100 mLs Intravenous Contrast Given 11/17/17 1923)  HYDROmorphone (DILAUDID) injection 0.5 mg (0.5 mg Intravenous Given 11/17/17 1959)  ondansetron (ZOFRAN) injection 4 mg (4 mg Intravenous Given 11/17/17 2004)  HYDROmorphone (DILAUDID) injection 0.5 mg (0.5 mg Intravenous Given 11/17/17 2017)  scopolamine (TRANSDERM-SCOP) 1 MG/3DAYS (  Override pull for Anesthesia 11/17/17 2215)     Initial Impression / Assessment and Plan / ED Course  I have reviewed the triage vital signs and the nursing notes.  Pertinent labs & imaging results that were available during my care of the patient were reviewed by me and considered in my medical decision making (see chart for details).     Patient here for evaluation of acute onset abdominal pain radiating to her back. She was ill appearing on ED arrival with diaphoresis, pallor and hypertension and significant abdominal tenderness. Initial concern for possible rupture AAA versus dissection versus perforated viscous versus bowel obstruction. She was treated with IV fluids, pain medications and antiemetics. Her blood pressure did improve  following IV fluid hydration. CT abdomen and pelvis obtained and is consistent with bowel obstruction, concern for possible internal hernia given her presentation. Discussed with general surgery. Discussed with patient findings of studies recommendation for admission, NG placement and she is in agreement with plan. Surgery evaluated the patient in the emergency department with plan to  admit for further management.  Final Clinical Impressions(s) / ED Diagnoses   Final diagnoses:  None    ED Discharge Orders    None       Quintella Reichert, MD 11/18/17 0111

## 2017-11-17 NOTE — ED Notes (Signed)
This RN pulled and gave 64mcg of Fentanyl to this pt.  Per EDP 10 minutes later she wanted this RN to give another 7mcg.  Dr. Ayesha Rumpf stated she would put order in and forgot.  I went to pull the additional dose and the order was not in.  I went back to this pt room and put in a verbal order for an additional for 56mcg of Fentanyl.  Per EDP I drew the remaining 16mcg out of the original bottle and gave it.  I went to waste the first 75mcg out of the bottle with [employer] thinking I need to make the count right on the pyxis, but I did not need to waste since I gave all 163mcg out of the original bottle.  I then proceeded to take out the other 66mcg and the order was no longer present so I was unable to draw out the additional dose even though I had already given it.  There is not a discrepancy in the pyxis but I wasted 23mcg in the pyxis in order to fix what I thought was an issue.

## 2017-11-17 NOTE — Op Note (Signed)
Preoperative Diagnosis: Abd Pain/Hypotensive  Postoprative Diagnosis: Abd Pain/Hypotensive  Procedure: Procedure(s): LAPAROSCOPY LYSIS OF ADHESIONS FOR SMALL BOWEL OBSTRUCTION LYSIS OF ADHESION   Surgeon: Excell Seltzer T   Assistants: None  Anesthesia:  General endotracheal anesthesia  Indications: Patient is a 67 year old female with history of gastric bypass at [facility] about 2014.  She has lost 100 pounds and has done well for a number of years.  She presented to the emergency department today after several hours of acute onset of sharp severe mid abdominal pain unrelenting associated with nausea.  She was noted to be clammy in the emergency department.  Lab work unremarkable.  CT scan has shown evidence of small bowel obstruction.  My review questions some swirling in the mesentery as well.  Her pain has been ongoing and with these findings I recommended proceeding with emergency laparoscopy for bowel obstruction and possible internal hernia.  I discussed the nature and indications of the surgery with the patient including possible need for open procedure and risks of anesthetic complications, bleeding, infection or visceral injury.  She understands and agrees to proceed.    Procedure Detail: Patient was brought to the operating room, placed in supine position on the operating table, and general endotracheal anesthesia induced.  She received preoperative IV antibiotics.  PAS were in place.  The abdomen was widely sterilely prepped and draped.  Patient timeout was performed and correct procedure verified.  Trocar sites were infiltrated with local anesthesia.  Access was obtained in the left upper quadrant with a 5 mm Optiview trocar without difficulty and pneumoperitoneum established.  There was no evidence of trocar injury.  There was clearly some dilated small bowel but no evidence of ischemia or any significant ascites.  Under direct vision 5 mm trochars were placed in the right upper  quadrant and right mid abdomen.  I identified the cecum and ileocecal valve in the right lower quadrant.  The small bowel was then traced proximally.  At one point there may have been some tethering that was released but no evidence of internal hernia as I followed the bowel up to the jejuno-jejunal anastomosis.  There was a very small area of hemorrhage in one area of the ileum but clearly viable and not injured.  I suspect as noted later this may been from an adhesion.  In order to better inspect the mesentery more proximal bowel I placed a 5 mm trocar at the umbilicus for camera port.  The jejunojejunostomy was carefully inspected and was patent and the mesentery was closed with no mesenteric defect.  I then traced the Roux limb antecolic and there was no Peterson defect and no dilatation of the Roux limb.  I traced the biliopancreatic limb back to the ligament of Treitz was also appeared normal and without dilatation.  The bowel distal to the JJ was moderately dilated as noted previously except for the very terminal ileum.  I followed this back down distally to the ileocecal valve again.  At this point with this camera angle I was able to see an area of mild to moderate irritation or superficial hemorrhage at the base of the mesentery of the terminal ileum and cecum.  I suspect this may have been a small adhesion in the pelvis that I broke up as I was initially running the bowel proximally.  At this point though it was clear there was no mechanical obstruction or internal hernia or intrinsic abnormality of the bowel.  There is no evidence of bleeding  or injury.  All CO2 was evacuated and trochars removed.  The skin incisions were closed with some particular Monocryl and Dermabond.  Sponge needle and instrument counts were correct.    Findings: As above  Estimated Blood Loss:  Minimal         Drains: None  Blood Given: none          Specimens: None        Complications:  * No complications entered  in OR log *         Disposition: PACU - hemodynamically stable.         Condition: stable

## 2017-11-17 NOTE — Anesthesia Procedure Notes (Signed)
Procedure Name: Intubation Date/Time: 11/17/2017 10:27 PM Performed by: Claris Che, CRNA Pre-anesthesia Checklist: Patient identified, Emergency Drugs available, Suction available, Patient being monitored and Timeout performed Patient Re-evaluated:Patient Re-evaluated prior to induction Oxygen Delivery Method: Circle system utilized Preoxygenation: Pre-oxygenation with 100% oxygen Induction Type: IV induction, Rapid sequence and Cricoid Pressure applied Grade View: Grade II Tube type: Oral Tube size: 7.5 mm Number of attempts: 1 Airway Equipment and Method: Stylet Placement Confirmation: ETT inserted through vocal cords under direct vision,  positive ETCO2 and breath sounds checked- equal and bilateral Secured at: 24 cm Tube secured with: Tape Dental Injury: Teeth and Oropharynx as per pre-operative assessment

## 2017-11-17 NOTE — ED Notes (Signed)
Patient transported to CT 

## 2017-11-17 NOTE — Anesthesia Preprocedure Evaluation (Addendum)
Anesthesia Evaluation  Patient identified by MRN, date of birth, ID band Patient awake    Reviewed: Allergy & Precautions, NPO status , Patient's Chart, lab work & pertinent test results  History of Anesthesia Complications (+) PONV  Airway Mallampati: II  TM Distance: >3 FB Neck ROM: Full    Dental  (+) Implants, Caps, Dental Advisory Given   Pulmonary neg pulmonary ROS,    breath sounds clear to auscultation       Cardiovascular negative cardio ROS   Rhythm:Regular Rate:Normal     Neuro/Psych Anxiety Depression Vertigo, hearing loss    GI/Hepatic Neg liver ROS, GERD  Medicated and Controlled,N/v with acute bowel obstruction S/p Roux-en-Y gastric bypass   Endo/Other  PCOS obese  Renal/GU negative Renal ROS     Musculoskeletal   Abdominal   Peds  Hematology negative hematology ROS (+)   Anesthesia Other Findings   Reproductive/Obstetrics                            Anesthesia Physical Anesthesia Plan  ASA: II and emergent  Anesthesia Plan: General   Post-op Pain Management:    Induction: Rapid sequence and Intravenous  PONV Risk Score and Plan: 4 or greater and Ondansetron, Dexamethasone, Treatment may vary due to age or medical condition and Scopolamine patch - Pre-op  Airway Management Planned: Oral ETT  Additional Equipment:   Intra-op Plan:   Post-operative Plan: Extubation in OR  Informed Consent: I have reviewed the patients History and Physical, chart, labs and discussed the procedure including the risks, benefits and alternatives for the proposed anesthesia with the patient or authorized representative who has indicated his/her understanding and acceptance.   Dental advisory given  Plan Discussed with: CRNA and Surgeon  Anesthesia Plan Comments: (Plan routine monitors, GETA)       Anesthesia Quick Evaluation

## 2017-11-18 ENCOUNTER — Other Ambulatory Visit: Payer: Self-pay

## 2017-11-18 ENCOUNTER — Encounter (HOSPITAL_COMMUNITY): Payer: Self-pay | Admitting: General Surgery

## 2017-11-18 LAB — BASIC METABOLIC PANEL
ANION GAP: 5 (ref 5–15)
BUN: 10 mg/dL (ref 8–23)
CALCIUM: 7.8 mg/dL — AB (ref 8.9–10.3)
CHLORIDE: 110 mmol/L (ref 98–111)
CO2: 24 mmol/L (ref 22–32)
CREATININE: 0.73 mg/dL (ref 0.44–1.00)
GLUCOSE: 199 mg/dL — AB (ref 70–99)
Potassium: 4.1 mmol/L (ref 3.5–5.1)
Sodium: 139 mmol/L (ref 135–145)

## 2017-11-18 LAB — CBC
HEMATOCRIT: 35.6 % — AB (ref 36.0–46.0)
Hemoglobin: 11.5 g/dL — ABNORMAL LOW (ref 12.0–15.0)
MCH: 25.6 pg — ABNORMAL LOW (ref 26.0–34.0)
MCHC: 32.3 g/dL (ref 30.0–36.0)
MCV: 79.3 fL — ABNORMAL LOW (ref 80.0–100.0)
NRBC: 0 % (ref 0.0–0.2)
Platelets: 196 10*3/uL (ref 150–400)
RBC: 4.49 MIL/uL (ref 3.87–5.11)
RDW: 13.3 % (ref 11.5–15.5)
WBC: 7.9 10*3/uL (ref 4.0–10.5)

## 2017-11-18 MED ORDER — METOPROLOL TARTRATE 5 MG/5ML IV SOLN: 5.0000 mg | Freq: Four times a day (QID) | INTRAVENOUS | Status: DC | PRN

## 2017-11-18 MED ORDER — CEFAZOLIN SODIUM-DEXTROSE 2-4 GM/100ML-% IV SOLN
2.0000 g | Freq: Three times a day (TID) | INTRAVENOUS | Status: AC
  Administered 2017-11-18: 2 g via INTRAVENOUS
  Filled 2017-11-18: qty 100

## 2017-11-18 MED ORDER — KCL-LACTATED RINGERS-D5W 20 MEQ/L IV SOLN
INTRAVENOUS | Status: DC
  Administered 2017-11-18 (×3): via INTRAVENOUS
  Filled 2017-11-18 (×4): qty 1000

## 2017-11-18 MED ORDER — BUSPIRONE HCL 15 MG PO TABS: 7.5000 mg | ORAL_TABLET | Freq: Three times a day (TID) | ORAL | Status: DC | PRN

## 2017-11-18 MED ORDER — ACETAMINOPHEN 325 MG PO TABS: 650.0000 mg | ORAL_TABLET | Freq: Four times a day (QID) | ORAL | Status: DC | PRN

## 2017-11-18 MED ORDER — DIPHENHYDRAMINE HCL 50 MG/ML IJ SOLN: 12.5000 mg | Freq: Four times a day (QID) | INTRAMUSCULAR | Status: DC | PRN

## 2017-11-18 MED ORDER — GABAPENTIN 300 MG PO CAPS
300.0000 mg | ORAL_CAPSULE | Freq: Two times a day (BID) | ORAL | Status: DC
  Administered 2017-11-18 – 2017-11-19 (×4): 300 mg via ORAL
  Filled 2017-11-18 (×4): qty 1

## 2017-11-18 MED ORDER — KETOROLAC TROMETHAMINE 15 MG/ML IJ SOLN
15.0000 mg | Freq: Four times a day (QID) | INTRAMUSCULAR | Status: AC
  Administered 2017-11-18 (×4): 15 mg via INTRAVENOUS
  Filled 2017-11-18 (×4): qty 1

## 2017-11-18 MED ORDER — TOPIRAMATE 25 MG PO TABS
25.0000 mg | ORAL_TABLET | ORAL | Status: DC | PRN
  Administered 2017-11-19: 25 mg via ORAL
  Filled 2017-11-18 (×2): qty 1

## 2017-11-18 MED ORDER — ALPRAZOLAM 0.5 MG PO TABS
1.0000 mg | ORAL_TABLET | Freq: Three times a day (TID) | ORAL | Status: DC | PRN
  Administered 2017-11-18 – 2017-11-19 (×2): 1 mg via ORAL
  Filled 2017-11-18 (×2): qty 2

## 2017-11-18 MED ORDER — ACETAMINOPHEN 650 MG RE SUPP: 650.0000 mg | Freq: Four times a day (QID) | RECTAL | Status: DC | PRN

## 2017-11-18 MED ORDER — TRAMADOL HCL 50 MG PO TABS
50.0000 mg | ORAL_TABLET | Freq: Four times a day (QID) | ORAL | Status: DC | PRN
  Administered 2017-11-18: 50 mg via ORAL
  Filled 2017-11-18: qty 1

## 2017-11-18 MED ORDER — ACETAMINOPHEN 500 MG PO TABS
1000.0000 mg | ORAL_TABLET | Freq: Three times a day (TID) | ORAL | Status: DC
  Administered 2017-11-18 – 2017-11-19 (×3): 1000 mg via ORAL
  Filled 2017-11-18 (×4): qty 2

## 2017-11-18 MED ORDER — METHOCARBAMOL 1000 MG/10ML IJ SOLN: 500.0000 mg | Freq: Three times a day (TID) | INTRAVENOUS | Status: DC | PRN

## 2017-11-18 MED ORDER — MORPHINE SULFATE (PF) 2 MG/ML IV SOLN: 1.0000 mg | INTRAVENOUS | Status: DC | PRN

## 2017-11-18 MED ORDER — HYDROXYZINE HCL 25 MG PO TABS
25.0000 mg | ORAL_TABLET | Freq: Every day | ORAL | Status: DC
  Administered 2017-11-18 (×2): 25 mg via ORAL
  Filled 2017-11-18 (×2): qty 1

## 2017-11-18 MED ORDER — SIMETHICONE 80 MG PO CHEW: 40.0000 mg | CHEWABLE_TABLET | Freq: Four times a day (QID) | ORAL | Status: DC | PRN

## 2017-11-18 MED ORDER — FAMOTIDINE 20 MG PO TABS
20.0000 mg | ORAL_TABLET | Freq: Every day | ORAL | Status: DC
  Administered 2017-11-19: 20 mg via ORAL
  Filled 2017-11-18 (×2): qty 1

## 2017-11-18 MED ORDER — KETOROLAC TROMETHAMINE 15 MG/ML IJ SOLN: 15.0000 mg | Freq: Four times a day (QID) | INTRAMUSCULAR | Status: DC | PRN

## 2017-11-18 MED ORDER — ESCITALOPRAM OXALATE 20 MG PO TABS
20.0000 mg | ORAL_TABLET | Freq: Every day | ORAL | Status: DC
  Administered 2017-11-18 – 2017-11-19 (×2): 20 mg via ORAL
  Filled 2017-11-18 (×2): qty 1

## 2017-11-18 MED ORDER — ONDANSETRON HCL 4 MG/2ML IJ SOLN: 4.0000 mg | Freq: Four times a day (QID) | INTRAMUSCULAR | Status: DC | PRN

## 2017-11-18 MED ORDER — DIPHENHYDRAMINE HCL 12.5 MG/5ML PO ELIX: 12.5000 mg | ORAL_SOLUTION | Freq: Four times a day (QID) | ORAL | Status: DC | PRN

## 2017-11-18 MED ORDER — ONDANSETRON 4 MG PO TBDP: 4.0000 mg | ORAL_TABLET | Freq: Four times a day (QID) | ORAL | Status: DC | PRN

## 2017-11-18 MED ORDER — ZOLPIDEM TARTRATE 5 MG PO TABS: 5.0000 mg | ORAL_TABLET | Freq: Every evening | ORAL | Status: DC | PRN

## 2017-11-18 MED ORDER — ENOXAPARIN SODIUM 40 MG/0.4ML ~~LOC~~ SOLN
40.0000 mg | SUBCUTANEOUS | Status: DC
  Administered 2017-11-18 – 2017-11-19 (×2): 40 mg via SUBCUTANEOUS
  Filled 2017-11-18 (×2): qty 0.4

## 2017-11-18 MED FILL — ZOLPIDEM TARTRATE 10 MG TAB: 10 | 30 days supply | Qty: 30 | Fill #4

## 2017-11-18 NOTE — Progress Notes (Signed)
Nutrition Brief Note  Patient identified on the Malnutrition Screening Tool (MST) Report  Wt Readings from Last 15 Encounters:  11/18/17 72.6 kg  09/24/17 74.5 kg  09/22/17 74.3 kg  08/13/17 75.5 kg  03/19/17 73.9 kg  03/11/17 73.4 kg  09/12/16 70.8 kg  07/02/16 71.1 kg  06/19/16 69.4 kg  01/22/16 72 kg  10/12/15 71.2 kg  09/28/15 71.3 kg  09/07/15 71.7 kg  12/18/14 72.3 kg  08/01/14 75.7 kg   Pt is a 67 yo F who presented to ED by EMS for severe acute onset abdominal pain. She is s/p lap band by Dr. Lucia Gaskins in 2009 which was removed and converted to a gastric bypass by Dr. Alvan Dame at [facility] in 2013.  She has not ever had problems with the bypass before.    Pt admitted with SBO.  11/5- s/p Procedure(s): LAPAROSCOPY LYSIS OF ADHESIONS FOR SMALL BOWEL OBSTRUCTION LYSIS OF ADHESION  Pt sitting in bed, in good spirits, with multiple visitors. She was just advanced to clear liquid diet.   Reviewed wt hx, which revealed wt stability over the past year.   Pt with no signs of fat or muscle depletion.   Body mass index is 29.74 kg/m. Patient meets criteria for overweight based on current BMI.   Current diet order is clear liquid, patient is consuming approximately n/a% of meals at this time. Labs and medications reviewed.   No nutrition interventions warranted at this time. If nutrition issues arise, please consult RD.   Edna Grover A. Jimmye Norman, RD, LDN, CDE Pager: [phone] After hours Pager: [phone]

## 2017-11-18 NOTE — Progress Notes (Signed)
1 Day Post-Op    CC:  Abdominal pain  Subjective: Patient looks good this a.m. her mouth is fairly dry.  She has no bowel sounds.  Port sites all look fine.  Moderate amount of discomfort normal for postop.  She also has problems with chronic headaches and is on tramadol for that.  Objective: Vital signs in last 24 hours: Temp:  [97.9 F (36.6 C)-98.4 F (36.9 C)] 98.4 F (36.9 C) (11/06 0527) Pulse Rate:  [64-89] 75 (11/06 0527) Resp:  [8-28] 16 (11/06 0527) BP: (103-127)/(55-95) 105/61 (11/06 0527) SpO2:  [94 %-100 %] 96 % (11/06 0527) Weight:  [72.6 kg] 72.6 kg (11/06 0102) Last BM Date: 11/17/17 IV 3825 Urine 1600 Afebrile vital signs are stable Glucose 199 WBC 7.9 Hemoglobin 11.5, hematocrit 35.6, platelets 190 6K Intake/Output from previous day: 11/05 0701 - 11/06 0700 In: 3825 [I.V.:2600; IV Piggyback:1000] Out: 1600 [Urine:1600] Intake/Output this shift: No intake/output data recorded.  General appearance: alert, cooperative and no distress Resp: clear to auscultation bilaterally GI: Soft, no bowel sounds no distention, port sites all look fine.  Lab Results:  Recent Labs    11/17/17 1845 11/17/17 1857 11/18/17 0216  WBC 10.0  --  7.9  HGB 13.8 13.6 11.5*  HCT 42.7 40.0 35.6*  PLT 265  --  196    BMET Recent Labs    11/17/17 1845 11/17/17 1857 11/18/17 0216  NA 137 138 139  K 3.8 3.8 4.1  CL 103 103 110  CO2 21*  --  24  GLUCOSE 154* 151* 199*  BUN 14 15 10   CREATININE 0.92 0.80 0.73  CALCIUM 9.0  --  7.8*   PT/INR Recent Labs    11/17/17 1845  LABPROT 13.7  INR 1.06    Recent Labs  Lab 11/17/17 1845  AST 49*  ALT 34  ALKPHOS 91  BILITOT 0.5  PROT 7.2  ALBUMIN 3.9     Lipase     Component Value Date/Time   LIPASE 35 11/17/2017 1845     Prior to Admission medications   Medication Sig Start Date End Date Taking? Authorizing Provider  ALPRAZolam (XANAX) 1 MG tablet TAKE 1 TABLET BY MOUTH 3 TIMES DAILY AS NEEDED FOR  ANXIETY Patient taking differently: Take 1 mg by mouth 3 (three) times daily as needed for anxiety.  09/22/17  Yes [name], MD  Biotin 5000 MCG CAPS Take 5,000 mcg by mouth daily.    Yes [provider]  busPIRone (BUSPAR) 7.5 MG tablet TAKE 1 TABLET (7.5 MG TOTAL) BY MOUTH 3 (THREE) TIMES DAILY AS NEEDED (ANXIETY). 11/16/17  Yes [name], MD  Calcium Carbonate-Vitamin D (CALCIUM 600 + D PO) Take 1 tablet by mouth daily.    Yes [provider]  cyanocobalamin 2000 MCG tablet Take 2,000 mcg by mouth daily.   Yes [provider]  escitalopram (LEXAPRO) 20 MG tablet Take 1 tablet (20 mg total) by mouth daily. 08/27/17  Yes [name], MD  hydrOXYzine (ATARAX/VISTARIL) 25 MG tablet TAKE 1 TABLET (25 MG TOTAL) BY MOUTH AT BEDTIME. 10/15/17  Yes [name], MD  ibuprofen (ADVIL,MOTRIN) 600 MG tablet Take 1 tablet (600 mg total) by mouth every 6 (six) hours as needed. Patient taking differently: Take 600 mg by mouth every 6 (six) hours as needed for headache, mild pain or moderate pain.  06/22/14  Yes Varney Biles, MD  ranitidine (ZANTAC) 150 MG capsule Take 1 capsule (150 mg total) by mouth  2 (two) times daily. 09/24/17  Yes Megan Salon, MD  topiramate (TOPAMAX) 25 MG tablet Take 1 tablet (25 mg total) by mouth 2 (two) times daily. Patient taking differently: Take 25 mg by mouth as needed (as directed for migraines).  12/24/16  Yes Melvenia Beam, MD  traMADol (ULTRAM) 50 MG tablet TAKE 1 TABLET BY MOUTH EVERY 6 HOURS AS NEEDED. Patient taking differently: Take 50 mg by mouth every 6 (six) hours as needed (for pain).  10/15/17  Yes Melvenia Beam, MD  zolpidem (AMBIEN) 10 MG tablet Take 1 tablet (10 mg total) by mouth at bedtime as needed. Patient taking differently: Take 10 mg by mouth at bedtime.  07/27/17  Yes [name], MD  ferrous sulfate 325 (65 FE) MG tablet Take 325 mg by mouth daily with breakfast.    [provider]     Medications: . enoxaparin (LOVENOX) injection  40 mg Subcutaneous Q24H  . escitalopram  20 mg Oral Daily  . famotidine  20 mg Oral Daily  . gabapentin  300 mg Oral BID  . hydrOXYzine  25 mg Oral QHS  . ketorolac  15 mg Intravenous Q6H   . ceFAZolin    . dextrose 5% lactated ringers with KCl 20 mEq/L 125 mL/hr at 11/18/17 0128  . 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 Marland Kitchen [place]   FEN: IV fluids/n.p.o. ID: Preop Ancef DVT: Lovenox Follow-up: Dr. Excell Seltzer 2 weeks   Plan: We will start her on clears and see how she does.  She is back on her preadmission medicines as before.    LOS: 1 day    Tavarious Freel 11/18/2017 [phone]

## 2017-11-18 NOTE — Anesthesia Postprocedure Evaluation (Signed)
Anesthesia Post Note  Patient: Denise Rice  Procedure(s) Performed: LAPAROSCOPY LYSIS OF ADHESIONS FOR SMALL BOWEL OBSTRUCTION (N/A Abdomen) LYSIS OF ADHESION (N/A Abdomen)     Patient location during evaluation: PACU Anesthesia Type: General Level of consciousness: awake and alert, oriented and patient cooperative Pain management: pain level controlled Vital Signs Assessment: post-procedure vital signs reviewed and stable Respiratory status: spontaneous breathing, nonlabored ventilation and respiratory function stable Cardiovascular status: blood pressure returned to baseline and stable Postop Assessment: no apparent nausea or vomiting Anesthetic complications: no    Last Vitals:  Vitals:   11/18/17 0000 11/18/17 0015  BP: 113/65 106/65  Pulse: 88 87  Resp: (!) 8 (!) 9  Temp:  36.6 C  SpO2: 100% 100%    Last Pain:  Vitals:   11/18/17 0015  PainSc: 1                  Adger Cantera,E. Nemiah Kissner

## 2017-11-19 LAB — CBC
HCT: 30.9 % — ABNORMAL LOW (ref 36.0–46.0)
Hemoglobin: 10.3 g/dL — ABNORMAL LOW (ref 12.0–15.0)
MCH: 27.1 pg (ref 26.0–34.0)
MCHC: 33.3 g/dL (ref 30.0–36.0)
MCV: 81.3 fL (ref 80.0–100.0)
NRBC: 0 % (ref 0.0–0.2)
PLATELETS: 150 10*3/uL (ref 150–400)
RBC: 3.8 MIL/uL — ABNORMAL LOW (ref 3.87–5.11)
RDW: 13.6 % (ref 11.5–15.5)
WBC: 6.4 10*3/uL (ref 4.0–10.5)

## 2017-11-19 LAB — BASIC METABOLIC PANEL
ANION GAP: 4 — AB (ref 5–15)
BUN: 10 mg/dL (ref 8–23)
CALCIUM: 7.7 mg/dL — AB (ref 8.9–10.3)
CO2: 25 mmol/L (ref 22–32)
Chloride: 110 mmol/L (ref 98–111)
Creatinine, Ser: 0.78 mg/dL (ref 0.44–1.00)
GLUCOSE: 113 mg/dL — AB (ref 70–99)
POTASSIUM: 4.5 mmol/L (ref 3.5–5.1)
SODIUM: 139 mmol/L (ref 135–145)

## 2017-11-19 MED ORDER — ACETAMINOPHEN 500 MG PO TABS: ORAL_TABLET | ORAL | 0 refills | Status: DC

## 2017-11-19 MED ORDER — ZOLPIDEM TARTRATE 10 MG PO TABS: ORAL_TABLET | ORAL | Status: DC

## 2017-11-19 MED ORDER — IBUPROFEN 200 MG PO TABS: ORAL_TABLET | ORAL | Status: DC

## 2017-11-19 MED ORDER — OXYCODONE HCL 5 MG PO TABS: 5.0000 mg | ORAL_TABLET | Freq: Four times a day (QID) | ORAL | 0 refills | Status: DC | PRN

## 2017-11-19 MED ORDER — IBUPROFEN 600 MG PO TABS: 600.0000 mg | ORAL_TABLET | Freq: Four times a day (QID) | ORAL | Status: DC | PRN

## 2017-11-19 MED ORDER — TOPIRAMATE 25 MG PO TABS: ORAL_TABLET | ORAL | Status: DC

## 2017-11-19 MED FILL — oxyCODONE HCL 5 MG TABS: 5 | 3 days supply | Qty: 15 | Fill #0

## 2017-11-19 NOTE — Progress Notes (Signed)
2 Days Post-Op    CC: Abdominal pain  Subjective: She feels good this morning she is tolerating liquids and has had a bowel movement.  Her port sites look good.  She is pretty close to going home.  Objective: Vital signs in last 24 hours: Temp:  [97.5 F (36.4 C)-98.1 F (36.7 C)] 97.5 F (36.4 C) (11/07 0522) Pulse Rate:  [56-65] 56 (11/07 0522) Resp:  [16] 16 (11/07 0522) BP: (93-115)/(52-65) 115/65 (11/07 0522) SpO2:  [99 %] 99 % (11/07 0522) Last BM Date: 11/17/17 360 Po 2400 IV Urine x 2 BM x 1 Afebrile vital signs are stable Labs all look good. Intake/Output from previous day: 11/06 0701 - 11/07 0700 In: 2805.8 [P.O.:360; I.V.:2445.8] Out: -  Intake/Output this shift: No intake/output data recorded.  General appearance: alert, cooperative and no distress Resp: clear to auscultation bilaterally GI: Soft, sore, normal postop, laparoscopic discomfort.  Sites all look good.  Lab Results:  Recent Labs    11/18/17 0216 11/19/17 0153  WBC 7.9 6.4  HGB 11.5* 10.3*  HCT 35.6* 30.9*  PLT 196 150    BMET Recent Labs    11/18/17 0216 11/19/17 0153  NA 139 139  K 4.1 4.5  CL 110 110  CO2 24 25  GLUCOSE 199* 113*  BUN 10 10  CREATININE 0.73 0.78  CALCIUM 7.8* 7.7*   PT/INR Recent Labs    11/17/17 1845  LABPROT 13.7  INR 1.06    Recent Labs  Lab 11/17/17 1845  AST 49*  ALT 34  ALKPHOS 91  BILITOT 0.5  PROT 7.2  ALBUMIN 3.9     Lipase     Component Value Date/Time   LIPASE 35 11/17/2017 1845     Medications: . acetaminophen  1,000 mg Oral Q8H  . enoxaparin (LOVENOX) injection  40 mg Subcutaneous Q24H  . escitalopram  20 mg Oral Daily  . famotidine  20 mg Oral Daily  . gabapentin  300 mg Oral BID  . 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 Excell Seltzer  POD#2   FEN: IV fluids/n.p.o. ID: Preop Ancef DVT: Lovenox Follow-up: Dr. Excell Seltzer 2 weeks   Plan: Regular diet I will check on her after lunch and possibly home later today.  LOS: 2 days    Kaleab Frasier 11/19/2017 [phone]

## 2017-11-19 NOTE — Plan of Care (Signed)

## 2017-11-19 NOTE — Discharge Instructions (Signed)
[facility] ______[facility] °LAPAROSCOPIC SURGERY: POST OP INSTRUCTIONS °Always review your discharge instruction sheet given to you by the facility where your surgery was performed. °IF YOU HAVE DISABILITY OR FAMILY LEAVE FORMS, YOU MUST BRING THEM TO THE OFFICE FOR PROCESSING.   °DO NOT GIVE THEM TO YOUR DOCTOR. ° °1. A prescription for pain medication may be given to you upon discharge.  Take your pain medication as prescribed, if needed.  If narcotic pain medicine is not needed, then you may take acetaminophen (Tylenol) or ibuprofen (Advil) as needed. °2. Take your usually prescribed medications unless otherwise directed. °3. If you need a refill on your pain medication, please contact your pharmacy.  They will contact our office to request authorization. Prescriptions will not be filled after 5pm or on week-ends. °4. You should follow a light diet the first few days after arrival home, such as soup and crackers, etc.  Be sure to include lots of fluids daily. °5. Most patients will experience some swelling and bruising in the area of the incisions.  Ice packs will help.  Swelling and bruising can take several days to resolve.  °6. It is common to experience some constipation if taking pain medication after surgery.  Increasing fluid intake and taking a stool softener (such as Colace) will usually help or prevent this problem from occurring.  A mild laxative (Milk of Magnesia or Miralax) should be taken according to package instructions if there are no bowel movements after 48 hours. °7. Unless discharge instructions indicate otherwise, you may remove your bandages 24-48 hours after surgery, and you may shower at that time.  You may have steri-strips (small skin tapes) in place directly over the incision.  These strips should be left on the skin for 7-10 days.  If your surgeon used skin glue on the incision, you may shower in 24 hours.  The glue will flake off over the next 2-3 weeks.  Any sutures or  staples will be removed at the office during your follow-up visit. °8. ACTIVITIES:  You may resume regular (light) daily activities beginning the next day--such as daily self-care, walking, climbing stairs--gradually increasing activities as tolerated.  You may have sexual intercourse when it is comfortable.  Refrain from any heavy lifting or straining until approved by your doctor. °a. You may drive when you are no longer taking prescription pain medication, you can comfortably wear a seatbelt, and you can safely maneuver your car and apply brakes. °b. RETURN TO WORK:  __________________________________________________________ °9. You should see your doctor in the office for a follow-up appointment approximately 2-3 weeks after your surgery.  Make sure that you call for this appointment within a day or two after you arrive home to insure a convenient appointment time. °10. OTHER INSTRUCTIONS: __________________________________________________________________________________________________________________________ __________________________________________________________________________________________________________________________ °WHEN TO CALL YOUR DOCTOR: °1. Fever over 101.0 °2. Inability to urinate °3. Continued bleeding from incision. °4. Increased pain, redness, or drainage from the incision. °5. Increasing abdominal pain ° °The clinic staff is available to answer your questions during regular business hours.  Please don’t hesitate to call and ask to speak to one of the nurses for clinical concerns.  If you have a medical emergency, go to the nearest emergency room or call 911.  A surgeon from [facility] is always on call at the hospital. °[address], [address], [place], [place]  [address] ? [address] [address], [place], [place]   [address] °[phone] ? [phone] ? FAX [phone] °Web site:   [email or web address] °

## 2017-11-19 NOTE — Progress Notes (Signed)
[name] to be D/C'd  per MD order. Discussed with the patient and all questions fully answered.  VSS, Skin clean, dry and intact without evidence of skin break down, no evidence of skin tears noted.  IV catheter discontinued intact. Site without signs and symptoms of complications. Dressing and pressure applied.  An After Visit Summary was printed and given to the patient. Patient received prescription.  D/c education completed with patient/family including follow up instructions, medication list, d/c activities limitations if indicated, with other d/c instructions as indicated by MD - patient able to verbalize understanding, all questions fully answered.   Patient instructed to return to ED, call 911, or call MD for any changes in condition.   Patient to be escorted via [place], and D/C home via private auto.

## 2017-11-20 ENCOUNTER — Other Ambulatory Visit: Payer: Self-pay | Admitting: *Deleted

## 2017-11-20 NOTE — Patient Outreach (Signed)
[facility]) Care Management  11/20/2017  Denise Rice December 28, 1950 [id number]   Subjective: Telephone call to patient's home number, spoke with female answering phone, states she Denise Rice) is currently sleeping,  left HIPAA compliant message for [facility], and requested call back.     Objective: Per KPN (Knowledge Performance Now, point of care tool) and chart review, patient hospitalized 11/17/17 -11/19/17 for Small bowel obstruction, status post LAPAROSCOPY LYSIS OF ADHESIONS FOR SMALL BOWEL OBSTRUCTION LYSIS OF ADHESION on 11/17/17.   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       Assessment: Received [facility] Transition of care referral on 11/18/17.   Transition of care follow up pending patient contact.       Plan: RNCM will send unsuccessful outreach  letter, [facility] pamphlet, will call patient for 2nd telephone outreach attempt, transition of care follow up, and proceed with case closure, within 10 business days if no return call.         Denise Rice H. Annia Friendly, BSN, [place] [facility] Telephonic CM Phone: [phone] Fax: [phone]

## 2017-11-21 ENCOUNTER — Other Ambulatory Visit: Payer: Self-pay

## 2017-11-21 DIAGNOSIS — M791 Myalgia, unspecified site: Secondary | ICD-10-CM

## 2017-11-21 DIAGNOSIS — W19XXXA Unspecified fall, initial encounter: Secondary | ICD-10-CM

## 2017-11-21 DIAGNOSIS — M7918 Myalgia, other site: Secondary | ICD-10-CM

## 2017-11-21 NOTE — Patient Outreach (Signed)
[facility]) Care Management  11/21/2017  Mistee Soliman 1950/09/10 [id number]   Late entry for 11/20/17 @ 5:00pm  Subjective: Received voicemail from [facility] times 2, 1st message was left for [employer] on [employer] regarding patient's return to work date, 2nd message was for this RNCM, patient gave verbal update on how she is feeling, date of birth given, and requested call back.   Telephone call to patient's home number, spoke with patient, and HIPAA verified.  Discussed [facility] Care Management [facility]MR Transition of care follow up, patient voiced understanding, and is in agreement to follow up.  Patient she states she is doing better, pain has decreased, and is planning to return to work on 11/23/17 if all goes well this weekend.   RNCM advised patient that RNCM had received a message intended for Zigmund Daniel, patient states the medication is making her very sleepy, left RNCM Kay's message by mistake, appreciative of the notification, and patient will call Zigmund Daniel with an update.  Patient states she is very tired at this time and requested a call back at a later time on her cell phone instead of home number.       Objective: Per KPN (Knowledge Performance Now, point of care tool) and chart review, patient hospitalized 11/17/17 -11/19/17 for Small bowel obstruction, status post LAPAROSCOPY LYSIS OF ADHESIONS FOR SMALL BOWEL OBSTRUCTION LYSIS OF ADHESION on 11/17/17.   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        Assessment: Received [facility]MR Transition of care referral on 11/18/17.   Transition of care  follow up pending patient contact.       Plan: RNCM has sent unsuccessful outreach  letter, [facility] pamphlet, will call patient for 3rd telephone outreach attempt, transition of care follow up, and proceed with case closure, within 10 business days if no return call.        Aubriel Khanna H. Annia Friendly, BSN, [place] [facility] Telephonic CM Phone: [phone] Fax: [phone]

## 2017-11-23 ENCOUNTER — Telehealth: Payer: Self-pay

## 2017-11-23 ENCOUNTER — Other Ambulatory Visit: Payer: Self-pay | Admitting: *Deleted

## 2017-11-23 NOTE — Telephone Encounter (Signed)
LM requesting call back. Patient recently discharged from hospital for SBO, to follow up with surgeon. Advised to call back with any concerns/needs.

## 2017-11-23 NOTE — Discharge Summary (Signed)
Physician Discharge Summary  Patient ID: Merl Guardino MRN: [id number] DOB/AGE: Feb 17, 1950 67 y.o.  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 Marland Kitchen [facility] Course:  Pt is a 67 yo F who presented to ED by EMS for severe acute onset abdominal pain.  She was finishing up a meeting and felt like she was being stabbed with a knife.  She said it was more severe that almost anything she had experienced.  She got very lightheaded and sweaty.  She threw up bilious foamy liquid.  She has passed gas in the last few hours.   She is s/p lap band by Dr. Lucia Gaskins in 2009 which was removed and converted to a gastric bypass by Dr. Alvan Dame at [facility] in 2013.  She has not ever had problems with the bypass before.    Patient was seen and evaluated by Dr. Stark Klein.  She was concerned for a possible internal hernia based on appearance of the mesentery on the CT scan as well as her symptomology.  She was admitted taken the operating room by Dr. Excell Seltzer one of our bariatric surgeons, along with Dr. Barry Dienes.  On laparoscopy there was no acute  changes but there was one area that was tethered to the abdominal wall.  This area was released.  After extensive laparoscopic evaluation was his opinion she had simple adhesions that had caused this difficulty.  No further intervention was required.  She returned to the floor in satisfactory condition.  She started having gas and diet was restarted with clear liquids on the first postoperative day.  She had a bowel movement early the second postoperative day her diet was advanced and she was ready for discharge later in the afternoon of her second postoperative day.  Her port sites are all healing nicely, she had no issues outside of normal postoperative soreness prior to discharge.  A follow-up appointment with Dr. Excell Seltzer in 2 weeks, as listed below.  Condition on discharge: Improved  CBC Latest Ref Rng & Units 11/19/2017 11/18/2017 11/17/2017  WBC 4.0 - 10.5 K/uL 6.4 7.9 -  Hemoglobin 12.0 - 15.0 g/dL 10.3(L) 11.5(L) 13.6  Hematocrit 36.0 - 46.0 % 30.9(L) 35.6(L) 40.0  Platelets 150 - 400 K/uL 150 196 -   CMP Latest Ref Rng & Units 11/19/2017 11/18/2017 11/17/2017  Glucose 70 - 99 mg/dL 113(H) 199(H) 151(H)  BUN 8 - 23 mg/dL 10 10 15   Creatinine 0.44 - 1.00 mg/dL 0.78 0.73 0.80  Sodium 135 - 145 mmol/L 139 139 138  Potassium 3.5 - 5.1 mmol/L 4.5 4.1 3.8  Chloride 98 - 111 mmol/L 110 110 103  CO2 22 - 32 mmol/L 25 24 -  Calcium 8.9 - 10.3 mg/dL 7.7(L) 7.8(L) -  Total Protein 6.5 - 8.1 g/dL - - -  Total Bilirubin 0.3 -  1.2 mg/dL - - -  Alkaline Phos 38 - 126 U/L - - -  AST 15 - 41 U/L - - -  ALT 0 - 44 U/L - - -     Disposition:  Home   Allergies as of 11/19/2017      Reactions   Topiramate Nausea Only      Medication List    TAKE these medications   acetaminophen 500 MG tablet Commonly known as:  TYLENOL Your can take 1000 mg every 8 hours as needed for pain.  This should be your primary pain medication.  You can alternate this with ibuprofen, or oxycodone as needed.  Do not  exceed 4000 mg of Tylenol per day.  You can buy this over-the-counter at any drugstore.   ALPRAZolam 1 MG tablet Commonly known as:  XANAX TAKE 1 TABLET BY MOUTH 3 TIMES DAILY AS NEEDED FOR ANXIETY What changed:    how much to take  how to take this  when to take this  reasons to take this  additional instructions   Biotin 5000 MCG Caps Take 5,000 mcg by mouth daily.   busPIRone 7.5 MG tablet Commonly known as:  BUSPAR TAKE 1 TABLET (7.5 MG TOTAL) BY MOUTH 3 (THREE) TIMES DAILY AS NEEDED (ANXIETY).   CALCIUM 600 + D PO Take 1 tablet by mouth daily.   cyanocobalamin 2000 MCG tablet Take 2,000 mcg by mouth daily.   escitalopram 20 MG tablet Commonly known as:  LEXAPRO Take 1 tablet (20 mg total) by mouth daily.   ferrous sulfate 325 (65 FE) MG tablet Take 325 mg by mouth daily with breakfast.   hydrOXYzine 25 MG tablet Commonly known as:  ATARAX/VISTARIL TAKE 1 TABLET (25 MG TOTAL) BY MOUTH AT BEDTIME.   ibuprofen 200 MG tablet Commonly known as:  ADVIL,MOTRIN You can take 2-3 tablets every 6 hours as needed for pain not relieved by plain Tylenol(acetaminophen.)  This is your second medication you can take for pain relief.  You can start this a couple hours after you take the Tylenol if you need more.  You can buy this over the counter at any drug store without a prescription. What changed:    medication strength  how much to take  how to take this  when to take this  reasons to take this  additional instructions   oxyCODONE 5 MG immediate release tablet Commonly known as:  Oxy IR/ROXICODONE Take 1 tablet (5 mg total) by mouth every 6 (six) hours as needed for moderate pain, severe pain or breakthrough pain (use for pain not relieved by oral Tylenol or ibuprofen).   ranitidine 150 MG capsule Commonly known as:  ZANTAC Take 1 capsule (150 mg total) by mouth 2 (two) times daily.   topiramate 25 MG tablet Commonly known as:  TOPAMAX Use this drug as  prescribed by your regular doctor What changed:    how much to take  how to take this  when to take this  additional instructions   traMADol 50 MG tablet Commonly known as:  ULTRAM TAKE 1 TABLET BY MOUTH EVERY 6 HOURS AS NEEDED. What changed:  reasons to take this   zolpidem 10 MG tablet Commonly known as:  AMBIEN Use this drug as you have prior to admission. What changed:    how much to take  how to take this  when to take this  reasons to take this  additional instructions      Follow-up Information  Excell Seltzer, MD Follow up on 12/03/2017.   Specialty:  General Surgery Why:  Your appointment is at 11 AM.  Be at the office 30 minutes early for check-in.  Bring photo ID and insurance information. Contact information: [address] [place]  [phone]           Signed: Earnstine Regal 11/23/2017, 2:49 PM

## 2017-11-23 NOTE — Patient Outreach (Signed)
[facility]) Care Management  11/23/2017  Denise Rice [birth year] [id number]   Subjective: Telephone call to patient's  mobile number, no answer, left HIPAA compliant voicemail message, and requested call back.     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      Assessment: Received UMR Transition of care referral on 11/18/17.Transition of care follow up pending patient contact.      Plan:RNCM has sent unsuccessful outreach letter, [facility] pamphlet, will call patient for 4th telephone outreach attempt, transition of care follow up, and proceed with case closure, within 10 business days if no return call.        Mihir Flanigan H. Annia Friendly, BSN, [place] [facility] Telephonic CM Phone: [phone] Fax: [phone]

## 2017-11-24 ENCOUNTER — Other Ambulatory Visit: Payer: Self-pay | Admitting: *Deleted

## 2017-11-24 ENCOUNTER — Encounter: Payer: Self-pay | Admitting: *Deleted

## 2017-11-24 ENCOUNTER — Other Ambulatory Visit: Payer: Self-pay | Admitting: Family Medicine

## 2017-11-24 DIAGNOSIS — M791 Myalgia, unspecified site: Secondary | ICD-10-CM

## 2017-11-24 DIAGNOSIS — M7918 Myalgia, other site: Secondary | ICD-10-CM

## 2017-11-24 DIAGNOSIS — W19XXXA Unspecified fall, initial encounter: Secondary | ICD-10-CM

## 2017-11-24 NOTE — Telephone Encounter (Signed)
Requested medication (s) are due for refill today: yes  Requested medication (s) are on the active medication list: yes    Last refill: 10/15/17  #60 0 refills  Future visit scheduled no}  Notes to clinic:Historical provider  Requested Prescriptions  Pending Prescriptions Disp Refills   traMADol (ULTRAM) 50 MG tablet 60 tablet 0    Sig: Take 1 tablet (50 mg total) by mouth every 6 (six) hours as needed.     Not Delegated - Analgesics:  Opioid Agonists Failed - 11/24/2017  8:15 AM      Failed - This refill cannot be delegated      Failed - Urine Drug Screen completed in last 360 days.      Passed - Valid encounter within last 6 months    Recent Outpatient Visits          2 months ago Grief Customer service manager [facility] [place], MD   3 months ago Grief Customer service manager [facility] [place], MD   8 months ago Flu-like symptoms   [facility] [place], [place]   8 months ago Acute bronchitis with bronchospasm   [facility], Karie Fetch, MD   4 years ago Diarrhea   [facility], Janalee Dane, MD

## 2017-11-24 NOTE — Telephone Encounter (Signed)
Copied from [id number]. Topic: Quick Communication - Rx Refill/Question >> Nov 24, 2017  7:59 AM Keene Breath wrote: Medication: traMADol (ULTRAM) 50 MG tablet  Patient requests a refill for the above medication.    Preferred Pharmacy (with phone number or street name): [place], [place] - [address] [phone] (Phone) [phone] (Fax)

## 2017-11-24 NOTE — Patient Outreach (Addendum)
[facility]) Care Management  11/24/2017  Naveena Eyman 1950-09-21 [id number]   Subjective: Received voicemail from [facility], states she is returning call, and requested call back on cell phone.  States she will be home today and planning to return to work on 11/25/17.  Telephone call to patient's mobile number, spoke with patient, and HIPAA verified.  Discussed [facility] Care Management UMR Transition of care follow up, patient voiced understanding, and is in agreement to follow up.  Patient states she is doing much better, pain is under control, being managed with pain medications, pain down to a dull roar, and has a follow up appointment with surgeon on 12/03/17.  States she has spoken with primary MD's office times 2 since hospital discharge and provider will be calling in today a prescription for additional Ultram.  Patient states she has an upcoming cataracts surgeries, was wondering if her recent hospitalization / surgery  would affect upcoming surgery, RNCM advised patient to follow up with cataract surgeon regarding her questions, surgeon would determine, and make recommendations, patient voiced understanding, states she will follow up with cataracts surgeon.  Patient states she is able to manage self care and has assistance as needed.  Patient voices understanding of medical diagnosis, surgery, and treatment plan.  States she is accessing the following [facility] benefits: outpatient pharmacy, hospital indemnity (verbally given contact number for [employer] [phone], will call to verify benefits, will file claim if appropriate, verbally given contact number for [place] [employer] [phone] to request itemized bill), does not need family medical leave act (FMLA) at this time, and is aware how to access FMLA if needed in the future.  Patient states she does not have any education material, transition of care, care coordination, disease management, disease monitoring, transportation,  community resource, or pharmacy needs at this time.   States she is very appreciative of the follow up and is in agreement to receive [place] [facility] information.      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       Assessment: Received UMR Transition of care referral on 11/18/17.Transition of care follow up completed, no care management needs, and will proceed with case closure.       Plan:RNCM will send patient successful outreach letter, [facility] pamphlet, and magnet. RNCM will complete case closure due to follow up completed / no care management needs.        Yehya Brendle H. Annia Friendly, BSN, [place] [facility] Telephonic CM Phone: [phone] Fax: [phone]

## 2017-11-25 ENCOUNTER — Other Ambulatory Visit: Payer: Self-pay | Admitting: Neurology

## 2017-11-25 DIAGNOSIS — M791 Myalgia, unspecified site: Secondary | ICD-10-CM

## 2017-11-25 DIAGNOSIS — W19XXXA Unspecified fall, initial encounter: Secondary | ICD-10-CM

## 2017-11-25 DIAGNOSIS — M7918 Myalgia, other site: Secondary | ICD-10-CM

## 2017-11-25 NOTE — Telephone Encounter (Signed)
Please call or mychart to clarify the refill request.  Was ordered by her neurologist in the past.  If chronic med per neuro, can request from Dr. Jaynee Eagles. Or is there another reason for refill request? If for surgical pain, then contact surgeon's office.  If other reason, let me know. Thanks.

## 2017-11-25 NOTE — Telephone Encounter (Signed)
Patient states that she had a Bowel obstruction and had to have surgery, she states the pain she is having is coming from that. I advised patient to reach out to the surgeons office to get pain management. Patient verbalized understanding  Denise Hall,  LPN

## 2017-11-25 NOTE — Telephone Encounter (Signed)
Last Filled: 10/15/2017 By Sarina Ill   Given #60 with no RF  Should she contact Surgeon office

## 2017-11-28 MED FILL — hydrOXYzine HCL 25 MG TABS: 25 | 30 days supply | Qty: 30 | Fill #1

## 2017-11-30 MED FILL — TOPIRAMATE 25 MG TABLET: 25 | 60 days supply | Qty: 120 | Fill #3

## 2017-12-03 ENCOUNTER — Other Ambulatory Visit: Payer: Self-pay

## 2017-12-03 DIAGNOSIS — W19XXXA Unspecified fall, initial encounter: Secondary | ICD-10-CM

## 2017-12-03 DIAGNOSIS — M7918 Myalgia, other site: Secondary | ICD-10-CM

## 2017-12-03 DIAGNOSIS — M791 Myalgia, unspecified site: Secondary | ICD-10-CM

## 2017-12-04 ENCOUNTER — Telehealth: Payer: Self-pay | Admitting: Neurology

## 2017-12-04 NOTE — Telephone Encounter (Signed)
Pt requesting refills for traMADol (ULTRAM) 50 MG tablet sent to [facility] out pt pharm, scheduled pt a f/u for 2/26

## 2017-12-04 NOTE — Telephone Encounter (Signed)
LMOM that it appears she has not been seen in our office since June 2018.  As Tramadol is a controlled rx., she would need to be seen before this can be refilled./fim

## 2017-12-07 ENCOUNTER — Other Ambulatory Visit: Payer: Self-pay | Admitting: Neurology

## 2017-12-07 DIAGNOSIS — M7918 Myalgia, other site: Secondary | ICD-10-CM

## 2017-12-07 DIAGNOSIS — M791 Myalgia, unspecified site: Secondary | ICD-10-CM

## 2017-12-07 DIAGNOSIS — W19XXXA Unspecified fall, initial encounter: Secondary | ICD-10-CM

## 2017-12-07 MED ORDER — TRAMADOL HCL 50 MG PO TABS: 50.0000 mg | ORAL_TABLET | Freq: Four times a day (QID) | ORAL | 3 refills | Status: DC | PRN

## 2017-12-07 MED FILL — traMADol HCL 50 MG TABS: 50 | 8 days supply | Qty: 30 | Fill #0

## 2017-12-07 NOTE — Telephone Encounter (Signed)
Noted  

## 2017-12-09 ENCOUNTER — Telehealth: Payer: Self-pay

## 2017-12-09 ENCOUNTER — Other Ambulatory Visit: Payer: Self-pay | Admitting: Neurology

## 2017-12-09 DIAGNOSIS — G43711 Chronic migraine without aura, intractable, with status migrainosus: Secondary | ICD-10-CM

## 2017-12-09 MED ORDER — ERENUMAB-AOOE 140 MG/ML ~~LOC~~ SOAJ: 140.0000 mg | SUBCUTANEOUS | 11 refills | Status: DC

## 2017-12-09 NOTE — Telephone Encounter (Signed)
Pending approval for Aimovig 140 mg Key: [id number] ICD 10: G43.711 Determination in 48 hours.

## 2017-12-10 DIAGNOSIS — Z760 Encounter for issue of repeat prescription: Secondary | ICD-10-CM | POA: Diagnosis not present

## 2017-12-13 ENCOUNTER — Other Ambulatory Visit: Payer: Self-pay | Admitting: Neurology

## 2017-12-13 DIAGNOSIS — G43709 Chronic migraine without aura, not intractable, without status migrainosus: Secondary | ICD-10-CM

## 2017-12-13 MED ORDER — FREMANEZUMAB-VFRM 225 MG/1.5ML ~~LOC~~ SOSY: 225.0000 mg | PREFILLED_SYRINGE | SUBCUTANEOUS | 11 refills | Status: DC

## 2017-12-14 ENCOUNTER — Telehealth: Payer: Self-pay | Admitting: *Deleted

## 2017-12-14 ENCOUNTER — Other Ambulatory Visit: Payer: Self-pay | Admitting: *Deleted

## 2017-12-14 MED FILL — busPIRone HCL 7.5 MG TABS: 7.5 | 30 days supply | Qty: 90 | Fill #1

## 2017-12-14 MED FILL — ALPRAZolam 1 MG TABS: 1 | 30 days supply | Qty: 90 | Fill #3

## 2017-12-14 MED FILL — AJOVY 225 MG/1.5ML SOSY: 225 | 30 days supply | Qty: 2 | Fill #0

## 2017-12-14 NOTE — Telephone Encounter (Addendum)
Authorization # [id number]. Faxed approval letter to [place]. Received a receipt of confirmation.

## 2017-12-14 NOTE — Telephone Encounter (Signed)
The authorization is effective for a maximum of 6 fills from 12/09/2017 to 06/08/2018, as long as the member is enrolled in their current health plan. The request was approved as submitted. This request is approved for 33mL (1 auto-injector) per 30 days

## 2017-12-14 NOTE — Telephone Encounter (Signed)
Ajovy 225 mg PA has been completed on Cover My Meds. KEY: [id number]. Anticipate determination within 24 hours from [place].

## 2017-12-15 ENCOUNTER — Other Ambulatory Visit: Payer: Self-pay | Admitting: *Deleted

## 2017-12-15 ENCOUNTER — Encounter: Payer: Self-pay | Admitting: *Deleted

## 2017-12-15 NOTE — Telephone Encounter (Signed)
Messaged pt through [employer].

## 2017-12-15 NOTE — Progress Notes (Signed)
error 

## 2017-12-15 NOTE — Telephone Encounter (Signed)
Let's try emgality. Would u prescribe it an let patient know? Also ask if she already started Ajovy, if so we will keep her on that with the copay card. I wonder what the copay will be for emgality.

## 2017-12-15 NOTE — Telephone Encounter (Addendum)
Received determination from [employer]. Ajovy was denied due to requirement to try Emgality and Aimovig first. Pt unable to take Aimovig right now d/t recent bowel obstruction and r/f constipation. Will send to Dr. Jaynee Eagles for next steps.   If we should choose to appeal this decision, call # [phone] or fax appeal to [phone].  PA Reference # [id number]

## 2017-12-16 MED FILL — ZOLPIDEM TARTRATE 10 MG TAB: 10 | 30 days supply | Qty: 30 | Fill #5

## 2017-12-22 ENCOUNTER — Other Ambulatory Visit: Payer: 59

## 2017-12-22 DIAGNOSIS — H25812 Combined forms of age-related cataract, left eye: Secondary | ICD-10-CM | POA: Diagnosis not present

## 2017-12-22 DIAGNOSIS — H2512 Age-related nuclear cataract, left eye: Secondary | ICD-10-CM | POA: Diagnosis not present

## 2017-12-23 DIAGNOSIS — H2511 Age-related nuclear cataract, right eye: Secondary | ICD-10-CM | POA: Diagnosis not present

## 2017-12-24 MED FILL — hydrOXYzine HCL 25 MG TABS: 25 | 30 days supply | Qty: 30 | Fill #2

## 2017-12-25 DIAGNOSIS — H26493 Other secondary cataract, bilateral: Secondary | ICD-10-CM | POA: Insufficient documentation

## 2017-12-29 DIAGNOSIS — H2511 Age-related nuclear cataract, right eye: Secondary | ICD-10-CM | POA: Diagnosis not present

## 2017-12-29 DIAGNOSIS — H25811 Combined forms of age-related cataract, right eye: Secondary | ICD-10-CM | POA: Diagnosis not present

## 2017-12-31 MED FILL — traMADol HCL 50 MG TABS: 50 | 8 days supply | Qty: 30 | Fill #1

## 2018-01-01 ENCOUNTER — Other Ambulatory Visit: Payer: Self-pay | Admitting: Family Medicine

## 2018-01-01 ENCOUNTER — Encounter: Payer: Self-pay | Admitting: Family Medicine

## 2018-01-01 DIAGNOSIS — R42 Dizziness and giddiness: Secondary | ICD-10-CM

## 2018-01-01 NOTE — Telephone Encounter (Signed)
Alprazolam last refilled 09/22/17 #90 with 3 refills.   Zolpidem last refilled 11/19/2017 by Earnstine Regal PA-C.  Last OV 09/22/17

## 2018-01-04 DIAGNOSIS — Z961 Presence of intraocular lens: Secondary | ICD-10-CM | POA: Diagnosis not present

## 2018-01-04 DIAGNOSIS — H04122 Dry eye syndrome of left lacrimal gland: Secondary | ICD-10-CM | POA: Diagnosis not present

## 2018-01-04 DIAGNOSIS — H04121 Dry eye syndrome of right lacrimal gland: Secondary | ICD-10-CM | POA: Diagnosis not present

## 2018-01-14 MED FILL — ALPRAZolam 1 MG TABS: 1 | 30 days supply | Qty: 90 | Fill #0

## 2018-01-14 MED FILL — ZOLPIDEM TARTRATE 10 MG TAB: 10 | 30 days supply | Qty: 30 | Fill #0

## 2018-01-19 ENCOUNTER — Other Ambulatory Visit: Payer: Self-pay | Admitting: Family Medicine

## 2018-01-19 MED ORDER — HYDROXYZINE HCL 25 MG PO TABS: 25.0000 mg | ORAL_TABLET | Freq: Every day | ORAL | 0 refills | Status: DC

## 2018-01-19 MED FILL — hydrOXYzine HCL 25 MG TABS: 25 | 30 days supply | Qty: 30 | Fill #0

## 2018-01-21 MED FILL — traMADol HCL 50 MG TABS: 50 | 8 days supply | Qty: 30 | Fill #2

## 2018-01-23 ENCOUNTER — Other Ambulatory Visit: Payer: Self-pay | Admitting: Neurology

## 2018-01-23 MED ORDER — GALCANEZUMAB-GNLM 120 MG/ML ~~LOC~~ SOAJ: 120.0000 mg | SUBCUTANEOUS | 11 refills | Status: DC

## 2018-01-25 MED FILL — EMGALITY 120 MG/ML SOAJ: 120 | 30 days supply | Qty: 1 | Fill #0

## 2018-01-26 ENCOUNTER — Telehealth: Payer: Self-pay | Admitting: Neurology

## 2018-01-26 NOTE — Telephone Encounter (Signed)
PA submitted for the patient through cover my meds/ [employer]. KEY: [id number]. Will wait to hear a response.

## 2018-01-27 ENCOUNTER — Ambulatory Visit (INDEPENDENT_AMBULATORY_CARE_PROVIDER_SITE_OTHER): Payer: 59 | Admitting: *Deleted

## 2018-01-27 DIAGNOSIS — Z7689 Persons encountering health services in other specified circumstances: Secondary | ICD-10-CM

## 2018-01-27 DIAGNOSIS — G43711 Chronic migraine without aura, intractable, with status migrainosus: Secondary | ICD-10-CM | POA: Diagnosis not present

## 2018-01-27 MED ORDER — GALCANEZUMAB-GNLM 120 MG/ML ~~LOC~~ SOAJ: 240.0000 mg | Freq: Once | SUBCUTANEOUS | 0 refills | Status: AC

## 2018-01-27 NOTE — Progress Notes (Signed)
Patient here for loading dose of Emgality 120 mg x 2 pens. Both administered in L abdomen. No bleeding. Aseptic technique used. Patient tolerated well. She also received injection education and educated on possible s/e of injection site reaction and when to call 911 in event that she has an allergic reactions. She verbalized appreciation.

## 2018-01-28 NOTE — Telephone Encounter (Signed)
Pa approved on cover my meds until 02/17/2018-07/17/2018 under [employer] insurance.

## 2018-02-02 ENCOUNTER — Other Ambulatory Visit: Payer: Self-pay | Admitting: Neurology

## 2018-02-02 MED ORDER — FREMANEZUMAB-VFRM 225 MG/1.5ML ~~LOC~~ SOSY: 225.0000 mg | PREFILLED_SYRINGE | SUBCUTANEOUS | 11 refills | Status: DC

## 2018-02-02 NOTE — Progress Notes (Signed)
ajovy 

## 2018-02-03 ENCOUNTER — Telehealth: Payer: Self-pay | Admitting: *Deleted

## 2018-02-03 MED FILL — AJOVY 225 MG/1.5ML SOSY: 225 | 30 days supply | Qty: 2 | Fill #0

## 2018-02-03 NOTE — Telephone Encounter (Signed)
Ajovy 225 mg PA completed on Cover My Meds. KEY: [id number]. Awaiting [employer] response within 24 hours.

## 2018-02-08 ENCOUNTER — Encounter: Payer: Self-pay | Admitting: *Deleted

## 2018-02-08 ENCOUNTER — Other Ambulatory Visit: Payer: Self-pay | Admitting: Family Medicine

## 2018-02-08 MED FILL — busPIRone HCL 7.5 MG TABS: 7.5 | 30 days supply | Qty: 90 | Fill #0

## 2018-02-08 MED FILL — ESCITALOPRAM 20 MG TABLET: 20 | 90 days supply | Qty: 90 | Fill #1

## 2018-02-08 NOTE — Addendum Note (Signed)
Addended by: Gildardo Griffes on: 02/08/2018 09:06 AM   Modules accepted: Orders

## 2018-02-08 NOTE — Telephone Encounter (Signed)
Received [employer] notification. Ajovy 225 mg has been approved for 6 months (fills) 02/03/2018 through 08/03/2018.   Faxed approval to pharmacy. Received a receipt of confirmation.

## 2018-02-11 MED FILL — ALPRAZolam 1 MG TABS: 1 | 30 days supply | Qty: 90 | Fill #1

## 2018-02-12 ENCOUNTER — Ambulatory Visit
Admission: RE | Admit: 2018-02-12 | Discharge: 2018-02-12 | Disposition: A | Discharge status: Home / Self Care | Payer: 59 | Source: Ambulatory Visit | Attending: Obstetrics & Gynecology | Admitting: Obstetrics & Gynecology

## 2018-02-12 DIAGNOSIS — M85852 Other specified disorders of bone density and structure, left thigh: Secondary | ICD-10-CM | POA: Diagnosis not present

## 2018-02-12 DIAGNOSIS — Z780 Asymptomatic menopausal state: Secondary | ICD-10-CM | POA: Diagnosis not present

## 2018-02-12 DIAGNOSIS — E2839 Other primary ovarian failure: Secondary | ICD-10-CM

## 2018-02-12 MED FILL — ZOLPIDEM TARTRATE 10 MG TAB: 10 | 30 days supply | Qty: 30 | Fill #1

## 2018-02-15 ENCOUNTER — Other Ambulatory Visit: Payer: Self-pay | Admitting: Family Medicine

## 2018-02-15 MED FILL — hydrOXYzine HCL 25 MG TABS: 25 | 30 days supply | Qty: 30 | Fill #0

## 2018-02-21 ENCOUNTER — Other Ambulatory Visit: Payer: Self-pay | Admitting: Obstetrics & Gynecology

## 2018-02-21 MED ORDER — SULFAMETHOXAZOLE-TRIMETHOPRIM 800-160 MG PO TABS: 1.0000 | ORAL_TABLET | Freq: Two times a day (BID) | ORAL | 0 refills | Status: DC

## 2018-02-22 ENCOUNTER — Other Ambulatory Visit: Payer: Self-pay | Admitting: Obstetrics & Gynecology

## 2018-02-22 MED ORDER — SULFAMETHOXAZOLE-TRIMETHOPRIM 800-160 MG PO TABS: 1.0000 | ORAL_TABLET | Freq: Two times a day (BID) | ORAL | 0 refills | Status: DC

## 2018-02-23 MED FILL — traMADol HCL 50 MG TABS: 50 | 8 days supply | Qty: 30 | Fill #3

## 2018-02-24 ENCOUNTER — Telehealth: Payer: Self-pay | Admitting: *Deleted

## 2018-02-24 MED ORDER — FLUCONAZOLE 150 MG PO TABS: ORAL_TABLET | ORAL | 0 refills | Status: DC

## 2018-02-24 MED FILL — SULFAMETHOXAZOLE-TMP DS TAB: 800-160 | 3 days supply | Qty: 6 | Fill #0

## 2018-02-24 MED FILL — FLUCONAZOLE 150 MG TABS: 150 | 3 days supply | Qty: 2 | Fill #0

## 2018-02-24 NOTE — Telephone Encounter (Signed)
Patient reports completed Bactrim and burning with urination has improved but not resolved.  Now has new onset of intense vaginal itching.  Denies vaginal discharge, pain or fever. Had some midline back pain yesterday during workout.  Feels abdomen is distended.   [facility].  Routing to Dr Sabra Heck for review. Office visit needed?

## 2018-02-24 NOTE — Telephone Encounter (Signed)
Call to patient. Advised of instructions from Dr Sabra Heck. Appointment scheduled for tomorrow at 1115. Diflucan sent to [facility]. Encounter closed.

## 2018-02-24 NOTE — Telephone Encounter (Signed)
Ok to send in rx for diflucan.  Probably need urine dip and culture.  May need different antibiotic.

## 2018-02-25 ENCOUNTER — Encounter: Payer: Self-pay | Admitting: Obstetrics & Gynecology

## 2018-02-25 ENCOUNTER — Other Ambulatory Visit: Payer: Self-pay

## 2018-02-25 ENCOUNTER — Ambulatory Visit (INDEPENDENT_AMBULATORY_CARE_PROVIDER_SITE_OTHER): Payer: 59 | Admitting: Obstetrics & Gynecology

## 2018-02-25 VITALS — BP 96/70 | HR 68 | Temp 98.1°F | Resp 14 | Ht 61.5 in | Wt 167.0 lb

## 2018-02-25 DIAGNOSIS — R300 Dysuria: Secondary | ICD-10-CM

## 2018-02-25 DIAGNOSIS — N3090 Cystitis, unspecified without hematuria: Secondary | ICD-10-CM | POA: Diagnosis not present

## 2018-02-25 LAB — POCT URINALYSIS DIPSTICK
Bilirubin, UA: NEGATIVE
Glucose, UA: NEGATIVE
Ketones, UA: NEGATIVE
Nitrite, UA: POSITIVE
PROTEIN UA: NEGATIVE
Urobilinogen, UA: 0.2 E.U./dL
pH, UA: 5 (ref 5.0–8.0)

## 2018-02-25 MED ORDER — NITROFURANTOIN MONOHYD MACRO 100 MG PO CAPS: 100.0000 mg | ORAL_CAPSULE | Freq: Two times a day (BID) | ORAL | 0 refills | Status: DC

## 2018-02-25 MED FILL — NITROFURANTOIN MONO-MCR 100: 100 | 5 days supply | Qty: 10 | Fill #0

## 2018-02-25 NOTE — Progress Notes (Signed)
GYNECOLOGY  VISIT  CC:   Pelvic Pain   HPI: 68 y.o. G0P0 Married White or Caucasian female here for UTI symptoms.  She started having urinary urgency and dysuria on Saturday.  She communicated with me over the weekend and bactrim DS BID was started.  She completed 3 days of this and called yesterday reporting the dysuria was some better but not she had a lot of internal itching.  Diflucan was called into pharmacy for pt.  Reports the itching has improved.  Denies vaginal discharge.  She continues to have dysuria and pressure and is now having lower pelvic pain.  Lifting her legs exacerbates the pain.  Denies fever.  Denies back pain.  GYNECOLOGIC HISTORY: Patient's last menstrual period was 01/13/2005. Contraception: Hysterectomy  Menopausal hormone therapy: none  Patient Active Problem List   Diagnosis Date Noted  . Small bowel obstruction ([place]) 11/17/2017  . Chronic vertigo 03/11/2017  . Major depression, chronic 03/11/2017  . PCOS (polycystic ovarian syndrome) 03/11/2017  . Insomnia 09/08/2015  . Bilateral sensorineural hearing loss 09/06/2014  . 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 History of laparoscopic adjustable gastric banding 01/22/2011  . Vitamin D deficiency 01/15/2008    Past Medical History:  Diagnosis Date  . Anal condyloma   . Deafness in left ear   . Hearing loss, sensorineural, high frequency    RIGHT EAR  . 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 Last eruption 3/12  . History of peptic ulcer   . Otosclerosis of left ear   . PCO (polycystic ovaries)   . PCOS (polycystic ovarian syndrome) 03/11/2017  . PONV (postoperative nausea and vomiting)   . 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 Wears hearing aid    both ears    Past Surgical History:  Procedure Laterality Date  . BLEPHAROPLASTY  03-05-2000  . CRANIOTOMY  08/2014   to repair dehescence in right ear  . HAMMER TOE SURGERY Left 08-29-2013   2nd toe  . IMPLANTATION BONE ANCHORED HEARING AID Left 07/27/2008   left temporal bone;now removed  . LAPAROSCOPIC CHOLECYSTECTOMY  03-31-2005  . LAPAROSCOPIC GASTRIC BANDING  02-08-2007  . LAPAROSCOPY N/A 11/17/2017   Procedure: LAPAROSCOPY LYSIS OF ADHESIONS FOR SMALL BOWEL OBSTRUCTION;  Surgeon: Excell Seltzer, MD;  Location: [facility];  Service: General;  Laterality: N/A;  . LASER ABLATION CONDOLAMATA N/A 07/30/2012   Procedure: LASER ABLATION CONDOLAMATA;  Surgeon: Leighton Ruff, MD;  Location: [facility];  Service: General;  Laterality: N/A;  . LASIK Bilateral 2002  . LYSIS OF ADHESION N/A 11/17/2017   Procedure: LYSIS OF ADHESION;  Surgeon: Excell Seltzer, MD;  Location: [facility];  Service: General;  Laterality: N/A;  . ROUX-EN-Y GASTRIC BYPASS  AUG 2013  . [place];  1994 x2;  02-03-1998  . STRABISMUS SURGERY Right   . VAGINAL HYSTERECTOMY  2007   fibroids    MEDS:   Current Outpatient Medications on File Prior to Visit  Medication Sig Dispense Refill  . acetaminophen (TYLENOL) 500 MG tablet Your can take 1000 mg every 8 hours as needed for pain.  This should be your primary pain medication.  You can alternate this with ibuprofen, or oxycodone as needed.  Do not exceed 4000 mg of Tylenol per day.  You can buy this over-the-counter at any drugstore. 30 tablet 0  . ALPRAZolam (  XANAX) 1 MG tablet TAKE 1 TABLET BY MOUTH THREE TIMES DAILY AS NEEDED FOR ANXIETY 90 tablet 3  . Biotin 5000 MCG CAPS Take 5,000 mcg by mouth daily.     . busPIRone (BUSPAR) 7.5 MG tablet TAKE 1 TABLET (7.5 MG TOTAL) BY MOUTH 3 TIMES DAILY AS NEEDED FOR ANXIETY 90 tablet 1  . Calcium Carbonate-Vitamin D (CALCIUM 600 + D PO) Take 1 tablet by mouth daily.     . cyanocobalamin 2000 MCG tablet Take 2,000 mcg by mouth daily.    Marland Kitchen escitalopram (LEXAPRO) 20 MG tablet Take 1 tablet (20 mg total) by mouth daily.  90 tablet 3  . ferrous sulfate 325 (65 FE) MG tablet Take 325 mg by mouth daily with breakfast.    . fluconazole (DIFLUCAN) 150 MG tablet Take one tablet now and repeat in 72 hours if needed. 2 tablet 0  . Fremanezumab-vfrm (AJOVY) 225 MG/1.5ML SOSY Inject 225 mg into the skin every 30 (thirty) days. 1 Syringe 11  . hydrOXYzine (ATARAX/VISTARIL) 25 MG tablet TAKE 1 TABLET BY MOUTH AT BEDTIME. 30 tablet 0  . ibuprofen (ADVIL,MOTRIN) 200 MG tablet You can take 2-3 tablets every 6 hours as needed for pain not relieved by plain Tylenol(acetaminophen.)  This is your second medication you can take for pain relief.  You can start this a couple hours after you take the Tylenol if you need more.  You can buy this over the counter at any drug store without a prescription.    . topiramate (TOPAMAX) 25 MG tablet Use this drug as prescribed by your regular doctor    . traMADol (ULTRAM) 50 MG tablet Take 1 tablet (50 mg total) by mouth every 6 (six) hours as needed. 30 tablet 3  . zolpidem (AMBIEN) 10 MG tablet TAKE 1 TABLET (10 MG TOTAL) BY MOUTH AT BEDTIME AS NEEDED. 30 tablet 5   No current facility-administered medications on file prior to visit.     ALLERGIES: Topiramate  Family History  Problem Relation Age of Onset  . Heart attack Mother   . Heart disease Mother   . Cancer Father        unknown primary  . Stroke Sister   . Diabetes Sister   . Diabetes Maternal Aunt   . Heart disease Maternal Aunt   . Cancer Paternal Aunt   . Cancer Paternal Uncle   . Colon cancer Neg Hx   . Colon polyps Neg Hx     SH:  Married, non smoker  Review of Systems  Gastrointestinal: Positive for abdominal distention and constipation.  Genitourinary: Positive for dysuria, frequency and urgency.  All other systems reviewed and are negative.   PHYSICAL EXAMINATION:    BP 96/70 (BP Location: Left Arm, Patient Position: Sitting, Cuff Size: Large)   Pulse 68   Temp 98.1 F (36.7 C) (Oral)   Resp 14   Ht 5'  1.5" (1.562 m)   Wt 167 lb (75.8 kg)   LMP 01/13/2005   BMI 31.04 kg/m     General appearance: alert, cooperative and appears stated age Abdomen: soft, suprapubic tenderness to palpation noted; bowel sounds normal; no masses,  no organomegaly Lymph:  no inguinal LAD noted  Pelvic: External genitalia:  no lesions              Urethra:  normal appearing urethra with no masses, tenderness or lesions              Bartholins and Skenes: normal  Vagina: normal appearing vagina with normal color and discharge, no lesions              Cervix: absent              Bimanual Exam:  Uterus:  uterus absent              Adnexa: no mass, fullness, tenderness  Chaperone was present for exam.  Assessment: Cystitis, not fully treated with Bactrim DS x 3 days  Plan: Urine micro and culture pending Rx changed to macrobid 100mg  bid x 5 days.   She was advised to call if symptoms are not significantly improved in 24 hours.

## 2018-02-26 ENCOUNTER — Ambulatory Visit (HOSPITAL_COMMUNITY)
Admission: RE | Admit: 2018-02-26 | Discharge: 2018-02-26 | Disposition: A | Discharge status: Home / Self Care | Payer: 59 | Source: Ambulatory Visit | Attending: Obstetrics & Gynecology | Admitting: Obstetrics & Gynecology

## 2018-02-26 ENCOUNTER — Telehealth: Payer: Self-pay | Admitting: Emergency Medicine

## 2018-02-26 ENCOUNTER — Other Ambulatory Visit: Payer: Self-pay | Admitting: Obstetrics & Gynecology

## 2018-02-26 ENCOUNTER — Encounter: Payer: Self-pay | Admitting: Obstetrics & Gynecology

## 2018-02-26 ENCOUNTER — Telehealth: Payer: Self-pay | Admitting: *Deleted

## 2018-02-26 ENCOUNTER — Ambulatory Visit (INDEPENDENT_AMBULATORY_CARE_PROVIDER_SITE_OTHER): Payer: 59 | Admitting: Obstetrics & Gynecology

## 2018-02-26 DIAGNOSIS — R102 Pelvic and perineal pain: Secondary | ICD-10-CM | POA: Insufficient documentation

## 2018-02-26 DIAGNOSIS — R300 Dysuria: Secondary | ICD-10-CM

## 2018-02-26 DIAGNOSIS — R739 Hyperglycemia, unspecified: Secondary | ICD-10-CM | POA: Diagnosis not present

## 2018-02-26 DIAGNOSIS — R1084 Generalized abdominal pain: Secondary | ICD-10-CM

## 2018-02-26 LAB — CBC WITH DIFFERENTIAL/PLATELET
Basophils Absolute: 0 10*3/uL (ref 0.0–0.2)
Basos: 1 %
EOS (ABSOLUTE): 0.1 10*3/uL (ref 0.0–0.4)
Eos: 2 %
Hematocrit: 36.8 % (ref 34.0–46.6)
Hemoglobin: 12.5 g/dL (ref 11.1–15.9)
Lymphocytes Absolute: 1.6 10*3/uL (ref 0.7–3.1)
Lymphs: 32 %
MCH: 27.5 pg (ref 26.6–33.0)
MCHC: 34 g/dL (ref 31.5–35.7)
MCV: 81 fL (ref 79–97)
Monocytes Absolute: 0.3 10*3/uL (ref 0.1–0.9)
Monocytes: 7 %
Neutrophils Absolute: 2.8 10*3/uL (ref 1.4–7.0)
Neutrophils: 58 %
PLATELETS: 256 10*3/uL (ref 150–450)
RBC: 4.54 x10E6/uL (ref 3.77–5.28)
RDW: 14.3 % (ref 11.7–15.4)
WBC: 4.8 10*3/uL (ref 3.4–10.8)

## 2018-02-26 LAB — URINALYSIS, MICROSCOPIC ONLY
Casts: NONE SEEN /lpf
RBC, UA: >30 /hpf — AB (ref 0–2)
WBC, UA: >30 /hpf — AB (ref 0–5)

## 2018-02-26 LAB — BASIC METABOLIC PANEL
BUN / CREAT RATIO: 18 (ref 12–28)
BUN: 17 mg/dL (ref 8–27)
CO2: 27 mmol/L (ref 20–29)
Calcium: 8.5 mg/dL — ABNORMAL LOW (ref 8.7–10.3)
Chloride: 103 mmol/L (ref 96–106)
Creatinine, Ser: 0.96 mg/dL (ref 0.57–1.00)
GFR calc Af Amer: 72 mL/min/{1.73_m2} (ref >59)
GFR calc non Af Amer: 62 mL/min/{1.73_m2} (ref >59)
Glucose: 150 mg/dL — ABNORMAL HIGH (ref 65–99)
Potassium: 4.8 mmol/L (ref 3.5–5.2)
Sodium: 139 mmol/L (ref 134–144)

## 2018-02-26 MED ORDER — SODIUM CHLORIDE (PF) 0.9 % IJ SOLN
INTRAMUSCULAR | Status: AC
  Filled 2018-02-26: qty 50

## 2018-02-26 MED ORDER — CIPROFLOXACIN HCL 500 MG PO TABS: 500.0000 mg | ORAL_TABLET | Freq: Two times a day (BID) | ORAL | 0 refills | Status: DC

## 2018-02-26 MED ORDER — IOHEXOL 300 MG/ML  SOLN
100.0000 mL | Freq: Once | INTRAMUSCULAR | Status: AC | PRN
  Administered 2018-02-26: 100 mL via INTRAVENOUS

## 2018-02-26 MED ORDER — IOHEXOL 300 MG/ML  SOLN
30.0000 mL | Freq: Once | INTRAMUSCULAR | Status: AC | PRN
  Administered 2018-02-26: 30 mL via ORAL

## 2018-02-26 MED FILL — CIPROFLOXACIN HCL 500 MG TA: 500 | 3 days supply | Qty: 6 | Fill #0

## 2018-02-26 NOTE — Progress Notes (Signed)
No prior auth required for CT/ABD pelvis per [facility].  Ref # [id number]

## 2018-02-26 NOTE — Telephone Encounter (Signed)
-----   Message from Megan Salon, MD sent at 02/26/2018  5:28 PM EST ----- Please let pt know her CT is normal.  CBC showed no WBC ct.  Glucose 150.  Electrolytes are normal.

## 2018-02-26 NOTE — Progress Notes (Signed)
GYNECOLOGY  VISIT  CC:   Pelvic pain  HPI: 68 y.o. G0P0 Married White or Caucasian female here for follow up of probable UTI.  She is still having low pelvic pain that has been present with worsening degree since Saturday.  It now pulls constantly along her lower pelvis.  She did start macrobid yesterday after finishing a 3 day course of bactrim but doesn't really feel any better.  Denies fever or pack pain.  Pelvic pain is worrisome to her.  Also, she continues to have dysuria.  Denies hematuria.  Dip u/a today is still abnormal as well.  GYNECOLOGIC HISTORY: Patient's last menstrual period was 01/13/2005. Contraception: Hysterectomy Menopausal hormone therapy: none  Patient Active Problem List   Diagnosis Date Noted  . Small bowel obstruction ([place]) 11/17/2017  . Chronic vertigo 03/11/2017  . Major depression, chronic 03/11/2017  . PCOS (polycystic ovarian syndrome) 03/11/2017  . Insomnia 09/08/2015  . Bilateral sensorineural hearing loss 09/06/2014  . 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 History of laparoscopic adjustable gastric banding 01/22/2011  . Vitamin D deficiency 01/15/2008    Past Medical History:  Diagnosis Date  . Anal condyloma   . Deafness in left ear   . Hearing loss, sensorineural, high frequency    RIGHT EAR  . 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 Last eruption 3/12  . History of peptic ulcer   . Otosclerosis of left ear   . PCO (polycystic ovaries)   . PCOS (polycystic ovarian syndrome) 03/11/2017  . PONV (postoperative nausea and vomiting)   . 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 Wears hearing aid    both ears    Past Surgical History:  Procedure Laterality Date  . BLEPHAROPLASTY  03-05-2000  . CRANIOTOMY  08/2014   to repair dehescence in right ear  . HAMMER TOE SURGERY Left 08-29-2013   2nd toe  . IMPLANTATION  BONE ANCHORED HEARING AID Left 07/27/2008   left temporal bone;now removed  . LAPAROSCOPIC CHOLECYSTECTOMY  03-31-2005  . LAPAROSCOPIC GASTRIC BANDING  02-08-2007  . LAPAROSCOPY N/A 11/17/2017   Procedure: LAPAROSCOPY LYSIS OF ADHESIONS FOR SMALL BOWEL OBSTRUCTION;  Surgeon: Excell Seltzer, MD;  Location: [facility];  Service: General;  Laterality: N/A;  . LASER ABLATION CONDOLAMATA N/A 07/30/2012   Procedure: LASER ABLATION CONDOLAMATA;  Surgeon: Leighton Ruff, MD;  [facility];  Service: General;  Laterality: N/A;  . LASIK Bilateral 2002  . LYSIS OF ADHESION N/A 11/17/2017   Procedure: LYSIS OF ADHESION;  Surgeon: Excell Seltzer, MD;  Location: [facility];  Service: General;  Laterality: N/A;  . ROUX-EN-Y GASTRIC BYPASS  AUG 2013  . [place];  1994 x2;  02-03-1998  . STRABISMUS SURGERY Right   . VAGINAL HYSTERECTOMY  2007   fibroids    MEDS:   Current Outpatient Medications on File Prior to Visit  Medication Sig Dispense Refill  . acetaminophen (TYLENOL) 500 MG tablet Your can take 1000 mg every 8 hours as needed for pain.  This should be your primary pain medication.  You can alternate this with ibuprofen, or oxycodone as needed.  Do not exceed 4000 mg of Tylenol per day.  You can buy this over-the-counter at any drugstore. 30 tablet 0  . ALPRAZolam (XANAX) 1 MG tablet TAKE 1 TABLET BY MOUTH THREE TIMES DAILY AS NEEDED FOR ANXIETY  90 tablet 3  . Biotin 5000 MCG CAPS Take 5,000 mcg by mouth daily.     . busPIRone (BUSPAR) 7.5 MG tablet TAKE 1 TABLET (7.5 MG TOTAL) BY MOUTH 3 TIMES DAILY AS NEEDED FOR ANXIETY 90 tablet 1  . Calcium Carbonate-Vitamin D (CALCIUM 600 + D PO) Take 1 tablet by mouth daily.     . cyanocobalamin 2000 MCG tablet Take 2,000 mcg by mouth daily.    Marland Kitchen escitalopram (LEXAPRO) 20 MG tablet Take 1 tablet (20 mg total) by mouth daily. 90 tablet 3  . ferrous sulfate 325 (65 FE) MG tablet Take 325 mg by mouth daily with breakfast.     . fluconazole (DIFLUCAN) 150 MG tablet Take one tablet now and repeat in 72 hours if needed. 2 tablet 0  . Fremanezumab-vfrm (AJOVY) 225 MG/1.5ML SOSY Inject 225 mg into the skin every 30 (thirty) days. 1 Syringe 11  . hydrOXYzine (ATARAX/VISTARIL) 25 MG tablet TAKE 1 TABLET BY MOUTH AT BEDTIME. 30 tablet 0  . ibuprofen (ADVIL,MOTRIN) 200 MG tablet You can take 2-3 tablets every 6 hours as needed for pain not relieved by plain Tylenol(acetaminophen.)  This is your second medication you can take for pain relief.  You can start this a couple hours after you take the Tylenol if you need more.  You can buy this over the counter at any drug store without a prescription.    . nitrofurantoin, macrocrystal-monohydrate, (MACROBID) 100 MG capsule Take 1 capsule (100 mg total) by mouth 2 (two) times daily. 10 capsule 0  . topiramate (TOPAMAX) 25 MG tablet Use this drug as prescribed by your regular doctor    . traMADol (ULTRAM) 50 MG tablet Take 1 tablet (50 mg total) by mouth every 6 (six) hours as needed. 30 tablet 3  . zolpidem (AMBIEN) 10 MG tablet TAKE 1 TABLET (10 MG TOTAL) BY MOUTH AT BEDTIME AS NEEDED. 30 tablet 5   No current facility-administered medications on file prior to visit.     ALLERGIES: Topiramate  Family History  Problem Relation Age of Onset  . Heart attack Mother   . Heart disease Mother   . Cancer Father        unknown primary  . Stroke Sister   . Diabetes Sister   . Diabetes Maternal Aunt   . Heart disease Maternal Aunt   . Cancer Paternal Aunt   . Cancer Paternal Uncle   . Colon cancer Neg Hx   . Colon polyps Neg Hx     SH:  Married, non smoker  Review of Systems  Gastrointestinal: Positive for abdominal pain.  All other systems reviewed and are negative.   PHYSICAL EXAMINATION:    BP 108/66 (BP Location: Left Arm, Patient Position: Sitting, Cuff Size: Large)   Pulse 76   Temp 98.2 F (36.8 C) (Oral)   Resp 16   Ht 5' 1.5" (1.562 m)   Wt 167 lb (75.8 kg)    LMP 01/13/2005   BMI 31.04 kg/m     General appearance: alert, cooperative and appears stated age CV:  Normal rate and rhythm Lungs:  CTA bilaterally Flank:  No CVA tenderness Abdomen: soft, tender to palpation along entire lower quadrants; bowel sounds normal; no masses,  no organomegaly Lymph:  no inguinal LAD noted  Pelvic:  Performed yesterday and was negeative  Chaperone was present for exam.  Assessment: Worsening lower pelvic pain Cystistis that is worsening despite second antibiotic  Plan: CBC with diff will be obtained  and BMP Plan CT abd/pelvis to r/o other causes of pain as her symptoms have not improved May need to change antibiotic as well Urine culture still pending from yesterday with no preliminary results.   ~15 minutes spent with patient >50% of time was in face to face discussion of above.

## 2018-02-26 NOTE — Telephone Encounter (Signed)
Encounter closed

## 2018-02-26 NOTE — Telephone Encounter (Signed)
Denise Rice from [facility] called with stat lab results. Labs also faxed and taken to Dr. Ammie Ferrier desk to review and advise.   Routing to provider for review.

## 2018-02-26 NOTE — Telephone Encounter (Signed)
Pt is aware of results. Thanks

## 2018-02-26 NOTE — Telephone Encounter (Signed)
Message left that Dr. Sabra Heck will communicate with patient.

## 2018-02-28 LAB — URINE CULTURE

## 2018-03-01 NOTE — Addendum Note (Signed)
Addended by: Megan Salon on: 03/01/2018 11:55 AM   Modules accepted: Orders

## 2018-03-04 LAB — HGB A1C W/O EAG: Hgb A1c MFr Bld: 5.3 % (ref 4.8–5.6)

## 2018-03-05 ENCOUNTER — Ambulatory Visit (INDEPENDENT_AMBULATORY_CARE_PROVIDER_SITE_OTHER): Payer: 59

## 2018-03-05 DIAGNOSIS — N3090 Cystitis, unspecified without hematuria: Secondary | ICD-10-CM

## 2018-03-05 NOTE — Progress Notes (Signed)
Patient in office for a TOC urine culture. Clean catch urine has been collected and sent to the lab to be resulted. Patient has completed the course of antibiotics.  Routing to provider and will close encounter.

## 2018-03-06 LAB — URINE CULTURE

## 2018-03-08 ENCOUNTER — Other Ambulatory Visit: Payer: Self-pay

## 2018-03-08 ENCOUNTER — Encounter: Payer: Self-pay | Admitting: Family Medicine

## 2018-03-08 ENCOUNTER — Ambulatory Visit (INDEPENDENT_AMBULATORY_CARE_PROVIDER_SITE_OTHER): Payer: 59 | Admitting: Family Medicine

## 2018-03-08 VITALS — BP 114/76 | HR 71 | Temp 98.2°F | Resp 16 | Ht 60.5 in | Wt 164.4 lb

## 2018-03-08 DIAGNOSIS — F329 Major depressive disorder, single episode, unspecified: Secondary | ICD-10-CM | POA: Diagnosis not present

## 2018-03-08 DIAGNOSIS — F4321 Adjustment disorder with depressed mood: Secondary | ICD-10-CM | POA: Diagnosis not present

## 2018-03-08 DIAGNOSIS — R42 Dizziness and giddiness: Secondary | ICD-10-CM | POA: Diagnosis not present

## 2018-03-08 DIAGNOSIS — F5101 Primary insomnia: Secondary | ICD-10-CM | POA: Diagnosis not present

## 2018-03-08 MED ORDER — HYDROXYZINE HCL 25 MG PO TABS: 25.0000 mg | ORAL_TABLET | Freq: Two times a day (BID) | ORAL | 3 refills | Status: DC | PRN

## 2018-03-08 MED ORDER — ALPRAZOLAM 1 MG PO TABS: ORAL_TABLET | ORAL | 5 refills | Status: DC

## 2018-03-08 MED FILL — hydrOXYzine HCL 25 MG TABS: 25 | 90 days supply | Qty: 180 | Fill #0

## 2018-03-08 MED FILL — AJOVY 225 MG/1.5ML SOSY: 225 | 30 days supply | Qty: 2 | Fill #1

## 2018-03-08 NOTE — Patient Instructions (Signed)
Please return for your annual complete physical; please come fasting.   If you have any questions or concerns, please don't hesitate to send me a message via MyChart or call the office at [phone]. Thank you for visiting with [place] today! It's our pleasure caring for you.

## 2018-03-08 NOTE — Progress Notes (Signed)
Subjective  CC:  Chief Complaint  Patient presents with  . Depression  . Anxiety    HPI: Denise Rice is a 68 y.o. female who presents to the office today to address the problems listed above in the chief complaint.  Here for f/u of mood and panic. Still grieving death of sister. Using xanax tid chronically for vertigo, buspar tid for anxiety and hydroxyzine at night for sleep and anxiety. Overall does ok, but still with negative repetitive thoughts and intermittent panic. No SI. Has seen psychiatry in the past but not yet needed now.   Last months have been tough: SBO, UTI with pelvic pain and cataract surgery. I reviewed those notes. Recent blood work from [employer] reviewed as well.   HM: due for CPE.   GAD 7 : Generalized Anxiety Score 03/08/2018  Nervous, Anxious, on Edge 3  Control/stop worrying 2  Worry too much - different things 3  Trouble relaxing 3  Restless 0  Easily annoyed or irritable 1  Afraid - awful might happen 3  Total GAD 7 Score 15  Anxiety Difficulty Somewhat difficult     Depression screen [facility] 2/9 03/08/2018 03/11/2017  Decreased Interest 1 0  Down, Depressed, Hopeless 2 0  PHQ - 2 Score 3 0  Altered sleeping 2 0  Tired, decreased energy 1 0  Change in appetite 1 0  Feeling bad or failure about yourself  2 0  Trouble concentrating 1 0  Moving slowly or fidgety/restless 1 0  Suicidal thoughts 0 0  PHQ-9 Score 11 0  Difficult doing work/chores Somewhat difficult -     Assessment  1. Chronic vertigo   2. Primary insomnia   3. Major depression, chronic   4. Grief reaction      Plan   Today's visit was 30 minutes long. Greater than 50% of this time was devoted to face to face counseling with the patient and coordination of care. We discussed her diagnosis, prognosis, treatment options and treatment plan is documented below.   Vertigo and mood:  Continue meds and increase hydroxyzine to bid prn for panic. Counseling done.   Sleep is doing well on  meds  Continue lexapro. Consider changing in 6-12 months if low mood persists. Consider trintellix   Follow up: Return for complete physical.  Visit date not found  No orders of the defined types were placed in this encounter.  Meds ordered this encounter  Medications  . hydrOXYzine (ATARAX/VISTARIL) 25 MG tablet    Sig: Take 1 tablet (25 mg total) by mouth 2 (two) times daily as needed.    Dispense:  180 tablet    Refill:  3  . ALPRAZolam (XANAX) 1 MG tablet    Sig: TAKE 1 TABLET BY MOUTH THREE TIMES DAILY AS NEEDED FOR ANXIETY    Dispense:  90 tablet    Refill:  5      I reviewed the patients updated PMH, FH, and SocHx.    Patient Active Problem List   Diagnosis Date Noted  . Bilateral sensorineural hearing loss 09/06/2014    Priority: High  . Chronic vertigo 03/11/2017    Priority: Medium  . Major depression, chronic 03/11/2017    Priority: Medium  . PCOS (polycystic ovarian syndrome) 03/11/2017    Priority: Medium  . Insomnia 09/08/2015    Priority: Medium  . 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 History of laparoscopic adjustable gastric banding 01/22/2011    Priority: Medium  .  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 Vitamin D deficiency 01/15/2008    Priority: Low  . Small bowel obstruction ([place]) 11/17/2017   Current Meds  Medication Sig  . acetaminophen (TYLENOL) 500 MG tablet Your can take 1000 mg every 8 hours as needed for pain.  This should be your primary pain medication.  You can alternate this with ibuprofen, or oxycodone as needed.  Do not exceed 4000 mg of Tylenol per day.  You can buy this over-the-counter at any drugstore.  Marland Kitchen ALPRAZolam (XANAX) 1 MG tablet TAKE 1 TABLET BY MOUTH THREE TIMES DAILY AS NEEDED FOR ANXIETY  . Biotin 5000 MCG CAPS Take 5,000 mcg by mouth daily.   . busPIRone (BUSPAR) 7.5 MG tablet TAKE 1 TABLET (7.5 MG TOTAL) BY MOUTH 3 TIMES DAILY AS NEEDED FOR ANXIETY    . Calcium Carbonate-Vitamin D (CALCIUM 600 + D PO) Take 1 tablet by mouth daily.   . cyanocobalamin 2000 MCG tablet Take 2,000 mcg by mouth daily.  Marland Kitchen escitalopram (LEXAPRO) 20 MG tablet Take 1 tablet (20 mg total) by mouth daily.  . ferrous sulfate 325 (65 FE) MG tablet Take 325 mg by mouth daily with breakfast.  . Fremanezumab-vfrm (AJOVY) 225 MG/1.5ML SOSY Inject 225 mg into the skin every 30 (thirty) days.  . hydrOXYzine (ATARAX/VISTARIL) 25 MG tablet Take 1 tablet (25 mg total) by mouth 2 (two) times daily as needed.  Marland Kitchen ibuprofen (ADVIL,MOTRIN) 200 MG tablet You can take 2-3 tablets every 6 hours as needed for pain not relieved by plain Tylenol(acetaminophen.)  This is your second medication you can take for pain relief.  You can start this a couple hours after you take the Tylenol if you need more.  You can buy this over the counter at any drug store without a prescription.  . topiramate (TOPAMAX) 25 MG tablet Use this drug as prescribed by your regular doctor  . zolpidem (AMBIEN) 10 MG tablet TAKE 1 TABLET (10 MG TOTAL) BY MOUTH AT BEDTIME AS NEEDED.  . [DISCONTINUED] ALPRAZolam (XANAX) 1 MG tablet TAKE 1 TABLET BY MOUTH THREE TIMES DAILY AS NEEDED FOR ANXIETY  . [DISCONTINUED] hydrOXYzine (ATARAX/VISTARIL) 25 MG tablet TAKE 1 TABLET BY MOUTH AT BEDTIME.    Allergies: Patient is allergic to topiramate. Family History: Patient family history includes Cancer in her father, paternal aunt, and paternal uncle; Diabetes in her maternal aunt and sister; Heart attack in her mother; Heart disease in her maternal aunt and mother; Stroke in her sister. Social History:  Patient  reports that she has never smoked. She has never used smokeless tobacco. She reports that she does not drink alcohol or use drugs.  Review of Systems: Constitutional: Negative for fever malaise or anorexia Cardiovascular: negative for chest pain Respiratory: negative for SOB or persistent cough Gastrointestinal: negative  for abdominal pain  Objective  Vitals: BP 114/76   Pulse 71   Temp 98.2 F (36.8 C) (Oral)   Resp 16   Ht 5' 0.5" (1.537 m)   Wt 164 lb 6.4 oz (74.6 kg)   LMP 01/13/2005   SpO2 98%   BMI 31.58 kg/m  General: no acute distress , A&Ox3 Psych: flat affect. Well kempt. Good eye contact and insight     Commons side effects, risks, benefits, and alternatives for medications and treatment plan prescribed today were discussed, and the patient expressed understanding of the given instructions. Patient is instructed to call or message via MyChart  if he/she has any questions or concerns regarding our treatment plan. No barriers to understanding were identified. We discussed Red Flag symptoms and signs in detail. Patient expressed understanding regarding what to do in case of urgent or emergency type symptoms.   Medication list was reconciled, printed and provided to the patient in AVS. Patient instructions and summary information was reviewed with the patient as documented in the AVS. This note was prepared with assistance of Dragon voice recognition software. Occasional wrong-word or sound-a-like substitutions may have occurred due to the inherent limitations of voice recognition software

## 2018-03-10 ENCOUNTER — Ambulatory Visit: Payer: 59 | Admitting: Neurology

## 2018-03-12 MED FILL — ALPRAZolam 1 MG TABS: 1 | 30 days supply | Qty: 90 | Fill #2

## 2018-03-12 MED FILL — ZOLPIDEM TARTRATE 10 MG TAB: 10 | 30 days supply | Qty: 30 | Fill #2

## 2018-03-14 DIAGNOSIS — F419 Anxiety disorder, unspecified: Secondary | ICD-10-CM | POA: Insufficient documentation

## 2018-03-19 ENCOUNTER — Emergency Department (HOSPITAL_COMMUNITY)
Admission: EM | Admit: 2018-03-19 | Discharge: 2018-03-19 | Disposition: A | Discharge status: Home / Self Care | Payer: 59 | Attending: Emergency Medicine | Admitting: Emergency Medicine

## 2018-03-19 ENCOUNTER — Encounter (HOSPITAL_COMMUNITY): Payer: Self-pay | Admitting: Emergency Medicine

## 2018-03-19 ENCOUNTER — Emergency Department (HOSPITAL_COMMUNITY): Payer: 59

## 2018-03-19 DIAGNOSIS — R52 Pain, unspecified: Secondary | ICD-10-CM | POA: Diagnosis not present

## 2018-03-19 DIAGNOSIS — R1084 Generalized abdominal pain: Secondary | ICD-10-CM | POA: Diagnosis not present

## 2018-03-19 DIAGNOSIS — Z79899 Other long term (current) drug therapy: Secondary | ICD-10-CM | POA: Diagnosis not present

## 2018-03-19 DIAGNOSIS — K567 Ileus, unspecified: Secondary | ICD-10-CM | POA: Diagnosis not present

## 2018-03-19 DIAGNOSIS — R1033 Periumbilical pain: Secondary | ICD-10-CM | POA: Insufficient documentation

## 2018-03-19 DIAGNOSIS — R110 Nausea: Secondary | ICD-10-CM | POA: Diagnosis not present

## 2018-03-19 LAB — COMPREHENSIVE METABOLIC PANEL
ALT: 38 U/L (ref 0–44)
ANION GAP: 11 (ref 5–15)
AST: 70 U/L — ABNORMAL HIGH (ref 15–41)
Albumin: 3.5 g/dL (ref 3.5–5.0)
Alkaline Phosphatase: 78 U/L (ref 38–126)
BUN: 14 mg/dL (ref 8–23)
CO2: 23 mmol/L (ref 22–32)
Calcium: 8.7 mg/dL — ABNORMAL LOW (ref 8.9–10.3)
Chloride: 105 mmol/L (ref 98–111)
Creatinine, Ser: 0.81 mg/dL (ref 0.44–1.00)
GFR calc Af Amer: >60 mL/min (ref >60)
GFR calc non Af Amer: >60 mL/min (ref >60)
Glucose, Bld: 92 mg/dL (ref 70–99)
Potassium: 3.8 mmol/L (ref 3.5–5.1)
Sodium: 139 mmol/L (ref 135–145)
Total Bilirubin: 0.4 mg/dL (ref 0.3–1.2)
Total Protein: 6.6 g/dL (ref 6.5–8.1)

## 2018-03-19 LAB — CBC WITH DIFFERENTIAL/PLATELET
Abs Immature Granulocytes: 0.02 10*3/uL (ref 0.00–0.07)
Basophils Absolute: 0 10*3/uL (ref 0.0–0.1)
Basophils Relative: 1 %
Eosinophils Absolute: 0.1 10*3/uL (ref 0.0–0.5)
Eosinophils Relative: 2 %
HCT: 39.9 % (ref 36.0–46.0)
HEMOGLOBIN: 12.9 g/dL (ref 12.0–15.0)
Immature Granulocytes: 0 %
Lymphocytes Relative: 36 %
Lymphs Abs: 2.2 10*3/uL (ref 0.7–4.0)
MCH: 27 pg (ref 26.0–34.0)
MCHC: 32.3 g/dL (ref 30.0–36.0)
MCV: 83.5 fL (ref 80.0–100.0)
Monocytes Absolute: 0.6 10*3/uL (ref 0.1–1.0)
Monocytes Relative: 9 %
NEUTROS PCT: 52 %
Neutro Abs: 3.2 10*3/uL (ref 1.7–7.7)
Platelets: 232 10*3/uL (ref 150–400)
RBC: 4.78 MIL/uL (ref 3.87–5.11)
RDW: 12.8 % (ref 11.5–15.5)
WBC: 6.1 10*3/uL (ref 4.0–10.5)
nRBC: 0 % (ref 0.0–0.2)

## 2018-03-19 LAB — URINALYSIS, ROUTINE W REFLEX MICROSCOPIC
Bilirubin Urine: NEGATIVE
Glucose, UA: NEGATIVE mg/dL
Hgb urine dipstick: NEGATIVE
Ketones, ur: NEGATIVE mg/dL
Leukocytes,Ua: NEGATIVE
Nitrite: NEGATIVE
Protein, ur: NEGATIVE mg/dL
SPECIFIC GRAVITY, URINE: 1.006 (ref 1.005–1.030)
pH: 8 (ref 5.0–8.0)

## 2018-03-19 LAB — LIPASE, BLOOD: Lipase: 28 U/L (ref 11–51)

## 2018-03-19 MED ORDER — SODIUM CHLORIDE 0.9 % IV SOLN
INTRAVENOUS | Status: DC
  Administered 2018-03-19: 17:00:00 via INTRAVENOUS

## 2018-03-19 MED ORDER — ONDANSETRON 4 MG PO TBDP: 4.0000 mg | ORAL_TABLET | Freq: Three times a day (TID) | ORAL | 0 refills | Status: DC | PRN

## 2018-03-19 MED ORDER — HYDROMORPHONE HCL 1 MG/ML IJ SOLN
1.0000 mg | Freq: Once | INTRAMUSCULAR | Status: AC
  Administered 2018-03-19: 1 mg via INTRAVENOUS
  Filled 2018-03-19: qty 1

## 2018-03-19 MED ORDER — IOHEXOL 300 MG/ML  SOLN
100.0000 mL | Freq: Once | INTRAMUSCULAR | Status: AC | PRN
  Administered 2018-03-19: 100 mL via INTRAVENOUS

## 2018-03-19 MED ORDER — ONDANSETRON HCL 4 MG/2ML IJ SOLN
4.0000 mg | Freq: Once | INTRAMUSCULAR | Status: AC
  Administered 2018-03-19: 4 mg via INTRAVENOUS
  Filled 2018-03-19: qty 2

## 2018-03-19 MED ORDER — SODIUM CHLORIDE 0.9 % IV BOLUS
1000.0000 mL | Freq: Once | INTRAVENOUS | Status: AC
  Administered 2018-03-19: 1000 mL via INTRAVENOUS

## 2018-03-19 MED ORDER — MORPHINE SULFATE (PF) 4 MG/ML IV SOLN
6.0000 mg | Freq: Once | INTRAVENOUS | Status: AC
  Administered 2018-03-19: 6 mg via INTRAVENOUS
  Filled 2018-03-19: qty 2

## 2018-03-19 NOTE — Discharge Instructions (Signed)
You were seen in the ER for abdominal pain.  CT showed ileus.  Your pain and nausea completely resolved in the ER.  You were deemed appropriate for discharge with supportive management and close monitoring of your symptoms.  Zofran for nausea.  You may take Tylenol for pain.  Over the next 24 to 48 hours you may start liquid cleared fluid diet.  Transition your diet slowly over the next 2 days if your pain has not reoccurred.  Go to an emergency department immediately if there is any return of your pain, nausea, vomiting, no passing gas or bowel movements for the next 2 to 3 days.

## 2018-03-19 NOTE — ED Provider Notes (Signed)
[facility] EMERGENCY DEPARTMENT Provider Note   CSN: [id number] Arrival date & time: 03/19/18  1534    History   Chief Complaint Chief Complaint  Patient presents with  . Abdominal Pain    HPI Denise Rice is a 68 y.o. female with history of gastric bypass surgery, cholecystectomy, vaginal hysterectomy, small bowel obstruction, peptic ulcer disease, is here for evaluation of abdominal pain.  Sudden onset approximately 30 minutes after eating lunch at around 1 PM today.  It is around her bellybutton, intermittent, moderate to severe.  Currently it is 8/10.  It radiates all over her abdomen.  Associated with nausea and chills.  States it feels similar to her small bowel obstruction many years ago.  No interventions.  No alleviating or aggravating factors.  Denies fever, vomiting, changes to bowel movements, urinary symptoms, abnormal vaginal discharge or bleeding.  No associated distal extremity paresthesias, numbness, weakness, CP, SOB. Last BM this morning. Patient has been dealing with some pelvic pain 2 weeks ago.  She went to OB/GYN and states she was treated for UTI, currently does not have any pelvic pain, dysuria, hematuria.  She had a CT A/P for evaluation of her pelvic pain on 2/14 which was normal.  Pain today feels different from the pelvic pain she was feeling a couple of weeks ago.     HPI  Past Medical History:  Diagnosis Date  . Anal condyloma   . Deafness in left ear   . Hearing loss, sensorineural, high frequency    RIGHT EAR  . 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 Last eruption 3/12  . History of peptic ulcer   . Otosclerosis of left ear   . PCO (polycystic ovaries)   . PCOS (polycystic ovarian syndrome) 03/11/2017  . PONV (postoperative nausea and vomiting)   . 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 Wears hearing aid    both ears    Patient Active Problem List   Diagnosis Date  Noted  . Small bowel obstruction ([place]) 11/17/2017  . Chronic vertigo 03/11/2017  . Major depression, chronic 03/11/2017  . PCOS (polycystic ovarian syndrome) 03/11/2017  . Insomnia 09/08/2015  . Bilateral sensorineural hearing loss 09/06/2014  . 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 History of laparoscopic adjustable gastric banding 01/22/2011  . Vitamin D deficiency 01/15/2008    Past Surgical History:  Procedure Laterality Date  . BLEPHAROPLASTY  03-05-2000  . CRANIOTOMY  08/2014   to repair dehescence in right ear  . HAMMER TOE SURGERY Left 08-29-2013   2nd toe  . IMPLANTATION BONE ANCHORED HEARING AID Left 07/27/2008   left temporal bone;now removed  . LAPAROSCOPIC CHOLECYSTECTOMY  03-31-2005  . LAPAROSCOPIC GASTRIC BANDING  02-08-2007  . LAPAROSCOPY N/A 11/17/2017   Procedure: LAPAROSCOPY LYSIS OF ADHESIONS FOR SMALL BOWEL OBSTRUCTION;  Surgeon: Excell Seltzer, MD;  Location: [facility];  Service: General;  Laterality: N/A;  . LASER ABLATION CONDOLAMATA N/A 07/30/2012   Procedure: LASER ABLATION CONDOLAMATA;  Surgeon: Leighton Ruff, MD;  Location: [facility];  Service: General;  Laterality: N/A;  . LASIK Bilateral 2002  . LYSIS OF ADHESION N/A 11/17/2017   Procedure: LYSIS OF ADHESION;  Surgeon: Excell Seltzer, MD;  Location: [facility];  Service: General;  Laterality: N/A;  . ROUX-EN-Y GASTRIC BYPASS  AUG 2013  . [place];  1994 x2;  02-03-1998  . STRABISMUS SURGERY Right   . VAGINAL HYSTERECTOMY  2007   fibroids     OB History    Gravida  0   Para      Term      Preterm      AB      Living        SAB      TAB      Ectopic      Multiple      Live Births               Home Medications    Prior to Admission medications   Medication Sig Start Date End Date Taking? Authorizing Provider  acetaminophen (TYLENOL) 500 MG tablet Your can take 1000 mg  every 8 hours as needed for pain.  This should be your primary pain medication.  You can alternate this with ibuprofen, or oxycodone as needed.  Do not exceed 4000 mg of Tylenol per day.  You can buy this over-the-counter at any drugstore. Patient taking differently: Take 500 mg by mouth every 8 (eight) hours as needed (pain). This should be your primary pain medication.  Do not exceed 4000 mg of Tylenol per day.  You can buy this over-the-counter at any drugstore. 11/19/17  Yes Earnstine Regal, PA-C  ALPRAZolam Duanne Moron) 1 MG tablet TAKE 1 TABLET BY MOUTH THREE TIMES DAILY AS NEEDED FOR ANXIETY Patient taking differently: Take 1 mg by mouth 2 (two) times daily.  03/08/18  Yes [name], MD  Biotin 5000 MCG CAPS Take 5,000 mcg by mouth daily.    Yes [provider]  busPIRone (BUSPAR) 7.5 MG tablet TAKE 1 TABLET (7.5 MG TOTAL) BY MOUTH 3 TIMES DAILY AS NEEDED FOR ANXIETY Patient taking differently: Take 7.5 mg by mouth 2 (two) times daily.  02/08/18  Yes [name], MD  Calcium Carbonate-Vitamin D (CALCIUM 600 + D PO) Take 1 tablet by mouth daily.    Yes [provider]  cyanocobalamin 2000 MCG tablet Take 2,000 mcg by mouth daily.   Yes [provider]  escitalopram (LEXAPRO) 20 MG tablet Take 1 tablet (20 mg total) by mouth daily. 08/27/17  Yes [name], MD  ferrous sulfate 325 (65 FE) MG tablet Take 325 mg by mouth daily with breakfast.   Yes [provider]  Fremanezumab-vfrm (AJOVY) 225 MG/1.5ML SOSY Inject 225 mg into the skin every 30 (thirty) days. 02/02/18  Yes Melvenia Beam, MD  hydrOXYzine (ATARAX/VISTARIL) 25 MG tablet Take 1 tablet (25 mg total) by mouth 2 (two) times daily as needed. Patient taking differently: Take 25 mg by mouth at bedtime.  03/08/18  Yes [name], MD  ibuprofen (ADVIL,MOTRIN) 200 MG tablet You can take 2-3 tablets every 6 hours as needed for pain not relieved by plain Tylenol(acetaminophen.)  This is your second  medication you can take for pain relief.  You can start this a couple hours after you take the Tylenol if you need more.  You can buy this over the counter at any drug store without a prescription. Patient taking differently: Take 200 mg by mouth every 6 (six) hours as needed (pain). This is your second medication you can take for pain relief.  You can buy this over the counter at any drug store without a prescription. 11/19/17  Yes Earnstine Regal, PA-C  Multiple Vitamin (MULTIVITAMIN WITH MINERALS) TABS tablet Take 1 tablet by mouth daily.   Yes [provider]  zolpidem (AMBIEN) 10 MG tablet TAKE 1 TABLET (10 MG TOTAL) BY MOUTH AT BEDTIME AS NEEDED. Patient taking differently: Take 10 mg by mouth at bedtime.  01/01/18  Yes [name], MD  ondansetron (ZOFRAN ODT) 4 MG disintegrating tablet Take 1 tablet (4 mg total) by mouth every 8 (eight) hours as needed for nausea or vomiting. 03/19/18   Kinnie Feil, PA-C  topiramate (TOPAMAX) 25 MG tablet Use this drug as prescribed by your regular doctor Patient not taking: Reported on 03/19/2018 11/19/17   Earnstine Regal, PA-C    Family History Family History  Problem Relation Age of Onset  . Heart attack Mother   . Heart disease Mother   . Cancer Father        unknown primary  . Stroke Sister   . Diabetes Sister   . Diabetes Maternal Aunt   . Heart disease Maternal Aunt   . Cancer Paternal Aunt   . Cancer Paternal Uncle   . Colon cancer Neg Hx   . Colon polyps Neg Hx     Social History Social History   Tobacco Use  . Smoking status: Never Smoker  . Smokeless tobacco: Never Used  Substance Use Topics  . Alcohol use: No    Alcohol/week: 0.0 standard drinks  . Drug use: No     Allergies   Topiramate   Review of Systems Review of Systems  Constitutional: Positive for chills.  Gastrointestinal: Positive for abdominal pain and nausea.  All other systems reviewed and are negative.    Physical Exam Updated  Vital Signs BP 133/75   Pulse 71   Temp 98.2 F (36.8 C) (Oral)   Resp 13   Ht 5' 0.5" (1.537 m)   Wt 74.6 kg   LMP 01/13/2005   SpO2 99%   BMI 31.58 kg/m   Physical Exam Vitals signs and nursing note reviewed.  Constitutional:      General: She is in acute distress.     Appearance: She is well-developed.     Comments: Looks uncomfortable but nontoxic.  HENT:     Head: Normocephalic and atraumatic.     Nose: Nose normal.  Eyes:     Conjunctiva/sclera: Conjunctivae normal.     Pupils: Pupils are equal, round, and reactive to light.  Neck:     Musculoskeletal: Normal range of motion.  Cardiovascular:     Rate and Rhythm: Normal rate and regular rhythm.     Comments: 1+ radial and DP pulses bilaterally. Pulmonary:     Effort: Pulmonary effort is normal.     Breath sounds: Normal breath sounds.  Abdominal:     General: Bowel sounds are normal.     Palpations: Abdomen is soft.     Tenderness: There is generalized abdominal tenderness.     Comments: Generalized abdominal tenderness.  No significant at periumbilical region, lower quadrants.  No suprapubic or CVA tenderness.  No guarding, rigidity.  No obvious distention.  Cannot evaluate for bowel sounds.  Musculoskeletal: Normal range of motion.  Skin:    General: Skin is warm and dry.     Capillary Refill: Capillary refill takes less than 2 seconds.  Neurological:     Mental Status: She is alert and oriented to person, place, and time.  Psychiatric:        Behavior: Behavior normal.      ED Treatments / Results  Labs (all labs ordered are listed, but only abnormal results are displayed) Labs  Reviewed  COMPREHENSIVE METABOLIC PANEL - Abnormal; Notable for the following components:      Result Value   Calcium 8.7 (*)    AST 70 (*)    All other components within normal limits  URINALYSIS, ROUTINE W REFLEX MICROSCOPIC - Abnormal; Notable for the following components:   Color, Urine STRAW (*)    All other components  within normal limits  LIPASE, BLOOD  CBC WITH DIFFERENTIAL/PLATELET    EKG None  Radiology Ct Abdomen Pelvis W Contrast  Result Date: 03/19/2018 CLINICAL DATA:  Periumbilical pain EXAM: CT ABDOMEN AND PELVIS WITH CONTRAST TECHNIQUE: Multidetector CT imaging of the abdomen and pelvis was performed using the standard protocol following bolus administration of intravenous contrast. CONTRAST:  1105mL OMNIPAQUE IOHEXOL 300 MG/ML  SOLN COMPARISON:  CT 02/26/2018 FINDINGS: Lower chest: Lung bases demonstrate no acute consolidation or effusion. The heart size is normal. Small hiatal hernia. Hepatobiliary: Status post cholecystectomy. Mild intra and extrahepatic biliary enlargement. Cyst in the inferior right hepatic lobe. Pancreas: Unremarkable. No pancreatic ductal dilatation or surrounding inflammatory changes. Spleen: Normal in size without focal abnormality. Adrenals/Urinary Tract: Adrenal glands are normal. No hydronephrosis. Cysts in the right kidney. The bladder is normal Stomach/Bowel: Status post gastric bypass surgery. Fluid-filled nondilated distal small bowel loops without well-defined transition zone. No colon wall thickening. Vascular/Lymphatic: Nonaneurysmal aorta.  No significant adenopathy. Reproductive: Status post hysterectomy. No adnexal masses. Other: Negative for free air or free fluid. Small fat in the umbilical region Musculoskeletal: Degenerative change without acute or suspicious abnormality IMPRESSION: 1. Fluid-filled nondilated distal small bowel without well-defined transition zone, possible ileus 2. Stable mild intra and extrahepatic biliary dilatation post cholecystectomy. Electronically Signed   By: Donavan Foil M.D.   On: 03/19/2018 18:18    Procedures Ultrasound ED Peripheral IV (Provider) Date/Time: 03/19/2018 4:04 PM Performed by: Kinnie Feil, PA-C Authorized by: Kinnie Feil, PA-C   Procedure details:    Indications: hydration, multiple failed IV attempts  and poor IV access     Skin Prep: chlorhexidine gluconate     Location: left upper medial arm.   Angiocath:  18 G   Bedside Ultrasound Guided: Yes     Images: not archived     Patient tolerated procedure without complications: Yes     Dressing applied: Yes   Comments:     Blood drawn, flushed with 10 cc w/o infiltration    (including critical care time)  Medications Ordered in ED Medications  sodium chloride 0.9 % bolus 1,000 mL (0 mLs Intravenous Stopped 03/19/18 1648)    And  0.9 %  sodium chloride infusion ( Intravenous New Bag/Given 03/19/18 1648)  ondansetron (ZOFRAN) injection 4 mg (4 mg Intravenous Given 03/19/18 1608)  morphine 4 MG/ML injection 6 mg (6 mg Intravenous Given 03/19/18 1609)  HYDROmorphone (DILAUDID) injection 1 mg (1 mg Intravenous Given 03/19/18 1647)  iohexol (OMNIPAQUE) 300 MG/ML solution 100 mL (100 mLs Intravenous Contrast Given 03/19/18 1740)     Initial Impression / Assessment and Plan / ED Course  I have reviewed the triage vital signs and the nursing notes.  Pertinent labs & imaging results that were available during my care of the patient were reviewed by me and considered in my medical decision making (see chart for details).  Clinical Course as of Mar 18 1924  Fri Mar 19, 2018  1843 IMPRESSION: 1. Fluid-filled nondilated distal small bowel without well-defined transition zone, possible ileus 2. Stable mild intra and extrahepatic biliary dilatation post cholecystectomy.  CT ABDOMEN PELVIS W CONTRAST [CG]    Clinical Course User Index [CG] Kinnie Feil, PA-C       Differential diagnosis includes viral gastroenteritis versus gastritis/PUD.  She has history of SBO and multiple abdominal surgeries, states it feels similar to previous SBO so this is high on differential.  She has no associated fever, chest pain, shortness of breath, distal paresthesias or numbness.  This is unlikely to be ACS, dissection.  Hemodynamically stable.  She has no urinary  symptoms, history of kidney stones or pelvic symptoms and lower suspicion for renal stone, pyelonephritis, UTI, torsion, POA.  Was recently treated for UTI.  Pelvic pain and urinary symptoms have since resolved 2 weeks ago.  CT A/P on 2/14 done for pelvic pain reviewed and unremarkable.  Will get screening labs, treat symptoms.  Given previous abdominal surgical history and SBO, will likely need advanced imaging.  1925: Labs unremarkable.  UA unremarkable.  CT A/P shows distal small bowel ileus.  Upon reevaluation patient had complete resolution of her symptoms.  No abdominal tenderness on exam.  She is tolerating fluids.  She has history of abdominal surgeries and SBO however given her resolution of symptoms, clinical improvement she will be discharged with supportive care.  Liquid diet for the next 48 hours.  She is aware that if her symptoms return she is to return to the ER for reevaluation.  Patient is eager to be discharged.  Discussed with EDMD. Final Clinical Impressions(s) / ED Diagnoses   Final diagnoses:  Periumbilical abdominal pain  Ileus [facility])    ED Discharge Orders         Ordered    ondansetron (ZOFRAN ODT) 4 MG disintegrating tablet  Every 8 hours PRN     03/19/18 1923           Arlean Hopping 03/19/18 1926    Julianne Rice, MD 03/26/18 [phone]

## 2018-03-19 NOTE — ED Notes (Signed)
No problems keeping water down.  Reviewed discharge instructions including prescriptions, no questions at this time.

## 2018-03-19 NOTE — ED Notes (Signed)
Pt given glass of water, sipping on it bedside

## 2018-03-19 NOTE — ED Triage Notes (Signed)
Pt arrived via EMS with reports of sudden sharp central abd that started an hour ago. Endorses nausea. Last BM today. Pt reports a bowel obstruction in November and sypmtoms feel the same.

## 2018-03-22 MED FILL — ONDANSETRON ODT 4 MG TABLET: 4 | 6 days supply | Qty: 20 | Fill #0

## 2018-03-24 ENCOUNTER — Encounter: Payer: Self-pay | Admitting: Family Medicine

## 2018-03-24 DIAGNOSIS — K567 Ileus, unspecified: Secondary | ICD-10-CM

## 2018-03-24 DIAGNOSIS — R1084 Generalized abdominal pain: Secondary | ICD-10-CM

## 2018-03-24 DIAGNOSIS — Z8719 Personal history of other diseases of the digestive system: Secondary | ICD-10-CM

## 2018-03-25 ENCOUNTER — Encounter: Payer: Self-pay | Admitting: *Deleted

## 2018-03-25 ENCOUNTER — Ambulatory Visit (INDEPENDENT_AMBULATORY_CARE_PROVIDER_SITE_OTHER): Payer: 59

## 2018-03-25 DIAGNOSIS — R1084 Generalized abdominal pain: Secondary | ICD-10-CM | POA: Diagnosis not present

## 2018-03-25 DIAGNOSIS — K567 Ileus, unspecified: Secondary | ICD-10-CM | POA: Diagnosis not present

## 2018-03-25 DIAGNOSIS — Z8719 Personal history of other diseases of the digestive system: Secondary | ICD-10-CM

## 2018-03-26 ENCOUNTER — Encounter: Payer: Self-pay | Admitting: Family Medicine

## 2018-03-26 ENCOUNTER — Ambulatory Visit (INDEPENDENT_AMBULATORY_CARE_PROVIDER_SITE_OTHER): Payer: 59 | Admitting: Family Medicine

## 2018-03-26 ENCOUNTER — Other Ambulatory Visit: Payer: Self-pay

## 2018-03-26 VITALS — BP 110/70 | HR 63 | Temp 98.1°F | Resp 16 | Ht 60.25 in | Wt 170.4 lb

## 2018-03-26 DIAGNOSIS — R1084 Generalized abdominal pain: Secondary | ICD-10-CM

## 2018-03-26 MED ORDER — HYOSCYAMINE SULFATE SL 0.125 MG SL SUBL: 0.1250 mg | SUBLINGUAL_TABLET | Freq: Three times a day (TID) | SUBLINGUAL | 1 refills | Status: DC

## 2018-03-26 MED FILL — OSCIMIN SL 0.125 MG TABLET: 0.125 | 10 days supply | Qty: 30 | Fill #0

## 2018-03-26 NOTE — Patient Instructions (Signed)
Please follow up if symptoms do not improve or as needed.   

## 2018-03-26 NOTE — Progress Notes (Signed)
Subjective  CC:  Chief Complaint  Patient presents with  . Follow-up    ER visit on 03/19/18 for ileus. Had Xray 03/25/18    HPI: Denise Rice is a 68 y.o. female who presents to the office today to address the problems listed above in the chief complaint.  Denise Rice presents for follow-up after ER visit as noted above.  Had severe abdominal pain.  Work-up was mostly negative.  Abdominal CT showed nondilated small bowel and it was called as possible ileus.  She was treated with Dilaudid.  Pain resolved.  Lab work was unremarkable.  Since she is done fine.  She is asymptomatic currently.  However she worries about recurrent bouts.  To review, he had small bowel obstruction in November.  He has had multiple abdominal surgeries.  Status post gastric bypass surgery.  He has follow-up abdominal x-ray this morning.  It was normal. Assessment  1. Generalized abdominal pain      Plan   Abdominal pain: Possible ileus versus abdominal cramping versus early SBO etc.  Reassured.  Levsin to be used if it experiences more cramping in the future.  Continue surveillance.  No further work-up needed right now. Follow up: Follow-up as scheduled 04/30/2018  No orders of the defined types were placed in this encounter.  Meds ordered this encounter  Medications  . Hyoscyamine Sulfate SL (LEVSIN/SL) 0.125 MG SUBL    Sig: Place 0.125 mg under the tongue 3 (three) times daily before meals.    Dispense:  30 each    Refill:  1      I reviewed the patients updated PMH, FH, and SocHx.    Patient Active Problem List   Diagnosis Date Noted  . Bilateral sensorineural hearing loss 09/06/2014    Priority: High  . Chronic vertigo 03/11/2017    Priority: Medium  . Major depression, chronic 03/11/2017    Priority: Medium  . PCOS (polycystic ovarian syndrome) 03/11/2017    Priority: Medium  . Insomnia 09/08/2015    Priority: Medium  . 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 History of  laparoscopic adjustable gastric banding 01/22/2011    Priority: Medium  . 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 Vitamin D deficiency 01/15/2008    Priority: Low  . Small bowel obstruction ([place]) 11/17/2017   Current Meds  Medication Sig  . acetaminophen (TYLENOL) 500 MG tablet Your can take 1000 mg every 8 hours as needed for pain.  This should be your primary pain medication.  You can alternate this with ibuprofen, or oxycodone as needed.  Do not exceed 4000 mg of Tylenol per day.  You can buy this over-the-counter at any drugstore. (Patient taking differently: Take 500 mg by mouth every 8 (eight) hours as needed (pain). This should be your primary pain medication.  Do not exceed 4000 mg of Tylenol per day.  You can buy this over-the-counter at any drugstore.)  . ALPRAZolam (XANAX) 1 MG tablet TAKE 1 TABLET BY MOUTH THREE TIMES DAILY AS NEEDED FOR ANXIETY (Patient taking differently: Take 1 mg by mouth 2 (two) times daily. )  . Biotin 5000 MCG CAPS Take 5,000 mcg by mouth daily.   . busPIRone (BUSPAR) 7.5 MG tablet TAKE 1 TABLET (7.5 MG TOTAL) BY MOUTH 3 TIMES DAILY AS NEEDED FOR ANXIETY (Patient taking differently: Take 7.5 mg by mouth 2 (two) times daily. )  . Calcium Carbonate-Vitamin D (CALCIUM 600 +  D PO) Take 1 tablet by mouth daily.   . cyanocobalamin 2000 MCG tablet Take 2,000 mcg by mouth daily.  Marland Kitchen escitalopram (LEXAPRO) 20 MG tablet Take 1 tablet (20 mg total) by mouth daily.  . ferrous sulfate 325 (65 FE) MG tablet Take 325 mg by mouth daily with breakfast.  . Fremanezumab-vfrm (AJOVY) 225 MG/1.5ML SOSY Inject 225 mg into the skin every 30 (thirty) days.  . hydrOXYzine (ATARAX/VISTARIL) 25 MG tablet Take 1 tablet (25 mg total) by mouth 2 (two) times daily as needed. (Patient taking differently: Take 25 mg by mouth at bedtime. )  . ibuprofen (ADVIL,MOTRIN) 200 MG tablet You can take 2-3 tablets every 6 hours as needed for pain not relieved by  plain Tylenol(acetaminophen.)  This is your second medication you can take for pain relief.  You can start this a couple hours after you take the Tylenol if you need more.  You can buy this over the counter at any drug store without a prescription. (Patient taking differently: Take 200 mg by mouth every 6 (six) hours as needed (pain). This is your second medication you can take for pain relief.  You can buy this over the counter at any drug store without a prescription.)  . Multiple Vitamin (MULTIVITAMIN WITH MINERALS) TABS tablet Take 1 tablet by mouth daily.  Marland Kitchen zolpidem (AMBIEN) 10 MG tablet TAKE 1 TABLET (10 MG TOTAL) BY MOUTH AT BEDTIME AS NEEDED. (Patient taking differently: Take 10 mg by mouth at bedtime. )    Allergies: Patient is allergic to topiramate. Family History: Patient family history includes Cancer in her father, paternal aunt, and paternal uncle; Diabetes in her maternal aunt and sister; Heart attack in her mother; Heart disease in her maternal aunt and mother; Stroke in her sister. Social History:  Patient  reports that she has never smoked. She has never used smokeless tobacco. She reports that she does not drink alcohol or use drugs.  Review of Systems: Constitutional: Negative for fever malaise or anorexia Cardiovascular: negative for chest pain Respiratory: negative for SOB or persistent cough Gastrointestinal: negative for abdominal pain  Objective  Vitals: BP 110/70   Pulse 63   Temp 98.1 F (36.7 C) (Oral)   Resp 16   Ht 5' 0.25" (1.53 m)   Wt 170 lb 6.4 oz (77.3 kg)   LMP 01/13/2005   SpO2 98%   BMI 33.00 kg/m  General: no acute distress , A&Ox3 Appears well, normal abdominal exam     Commons side effects, risks, benefits, and alternatives for medications and treatment plan prescribed today were discussed, and the patient expressed understanding of the given instructions. Patient is instructed to call or message via MyChart if he/she has any questions or  concerns regarding our treatment plan. No barriers to understanding were identified. We discussed Red Flag symptoms and signs in detail. Patient expressed understanding regarding what to do in case of urgent or emergency type symptoms.   Medication list was reconciled, printed and provided to the patient in AVS. Patient instructions and summary information was reviewed with the patient as documented in the AVS. This note was prepared with assistance of Dragon voice recognition software. Occasional wrong-word or sound-a-like substitutions may have occurred due to the inherent limitations of voice recognition software

## 2018-04-05 ENCOUNTER — Telehealth: Payer: 59 | Admitting: Family

## 2018-04-05 DIAGNOSIS — J069 Acute upper respiratory infection, unspecified: Secondary | ICD-10-CM | POA: Diagnosis not present

## 2018-04-05 MED ORDER — BENZONATATE 100 MG PO CAPS: 100.0000 mg | ORAL_CAPSULE | Freq: Three times a day (TID) | ORAL | 0 refills | Status: DC | PRN

## 2018-04-05 MED ORDER — FLUTICASONE PROPIONATE 50 MCG/ACT NA SUSP: 2.0000 | Freq: Every day | NASAL | 0 refills | Status: DC

## 2018-04-05 MED FILL — AJOVY 225 MG/1.5ML SOSY: 225 | 30 days supply | Qty: 2 | Fill #2

## 2018-04-05 NOTE — Progress Notes (Signed)
We are sorry you are not feeling well.  Here is how we plan to help!  Based on what you have shared with me, it looks like you may have a viral upper respiratory infection.  Upper respiratory infections are caused by a large number of viruses; however, rhinovirus is the most common cause.   Symptoms vary from person to person, with common symptoms including sore throat, cough, and fatigue or lack of energy.  A low-grade fever of up to 100.4 may present, but is often uncommon.  Symptoms vary however, and are closely related to a person's age or underlying illnesses.  The most common symptoms associated with an upper respiratory infection are nasal discharge or congestion, cough, sneezing, headache and pressure in the ears and face.  These symptoms usually persist for about 3 to 10 days, but can last up to 2 weeks.  It is important to know that upper respiratory infections do not cause serious illness or complications in most cases.    Upper respiratory infections can be transmitted from person to person, with the most common method of transmission being a person's hands.  The virus is able to live on the skin and can infect other persons for up to 2 hours after direct contact.  Also, these can be transmitted when someone coughs or sneezes; thus, it is important to cover the mouth to reduce this risk.  To keep the spread of the illness at [place], good hand hygiene is very important.  This is an infection that is most likely caused by a virus. There are no specific treatments other than to help you with the symptoms until the infection runs its course.  We are sorry you are not feeling well.  Here is how we plan to help!   For nasal congestion, you may use an oral decongestants such as Mucinex D or if you have glaucoma or high blood pressure use plain Mucinex.  Saline nasal spray or nasal drops can help and can safely be used as often as needed for congestion.  For your congestion, I have prescribed Fluticasone  nasal spray one spray in each nostril twice a day  If you do not have a history of heart disease, hypertension, diabetes or thyroid disease, prostate/bladder issues or glaucoma, you may also use Sudafed to treat nasal congestion.  It is highly recommended that you consult with a pharmacist or your primary care physician to ensure this medication is safe for you to take.     If you have a cough, you may use cough suppressants such as Delsym and Robitussin.  If you have glaucoma or high blood pressure, you can also use Coricidin HBP.   For cough I have prescribed for you A prescription cough medication called Tessalon Perles 100 mg. You may take 1-2 capsules every 8 hours as needed for cough  If you have a sore or scratchy throat, use a saltwater gargle-  to  teaspoon of salt dissolved in a 4-ounce to 8-ounce glass of warm water.  Gargle the solution for approximately 15-30 seconds and then spit.  It is important not to swallow the solution.  You can also use throat lozenges/cough drops and Chloraseptic spray to help with throat pain or discomfort.  Warm or cold liquids can also be helpful in relieving throat pain.  For headache, pain or general discomfort, you can use Ibuprofen or Tylenol as directed.   Some authorities believe that zinc sprays or the use of Echinacea may shorten the  course of your symptoms.   HOME CARE . Only take medications as instructed by your medical team. . Be sure to drink plenty of fluids. Water is fine as well as fruit juices, sodas and electrolyte beverages. You may want to stay away from caffeine or alcohol. If you are nauseated, try taking small sips of liquids. How do you know if you are getting enough fluid? Your urine should be a pale yellow or almost colorless. . Get rest. . Taking a steamy shower or using a humidifier may help nasal congestion and ease sore throat pain. You can place a towel over your head and breathe in the steam from hot water coming from a  faucet. . Using a saline nasal spray works much the same way. . Cough drops, hard candies and sore throat lozenges may ease your cough. . Avoid close contacts especially the very young and the elderly . Cover your mouth if you cough or sneeze . Always remember to wash your hands.   GET HELP RIGHT AWAY IF: . You develop worsening fever. . If your symptoms do not improve within 10 days . You develop yellow or green discharge from your nose over 3 days. . You have coughing fits . You develop a severe head ache or visual changes. . You develop shortness of breath, difficulty breathing or start having chest pain . Your symptoms persist after you have completed your treatment plan  MAKE SURE YOU   Understand these instructions.  Will watch your condition.  Will get help right away if you are not doing well or get worse.  Your e-visit answers were reviewed by a board certified advanced clinical practitioner to complete your personal care plan. Depending upon the condition, your plan could have included both over the counter or prescription medications. Please review your pharmacy choice. If there is a problem, you may call our nursing hot line at and have the prescription routed to another pharmacy. Your safety is important to [place]. If you have drug allergies check your prescription carefully.   You can use MyChart to ask questions about today's visit, request a non-urgent call back, or ask for a work or school excuse for 24 hours related to this e-Visit. If it has been greater than 24 hours you will need to follow up with your provider, or enter a new e-Visit to address those concerns. You will get an e-mail in the next two days asking about your experience.  I hope that your e-visit has been valuable and will speed your recovery. Thank you for using e-visits.

## 2018-04-06 MED FILL — ZOLPIDEM TARTRATE 10 MG TAB: 10 | 30 days supply | Qty: 30 | Fill #3

## 2018-04-06 MED FILL — busPIRone HCL 7.5 MG TABS: 7.5 | 30 days supply | Qty: 90 | Fill #1

## 2018-04-08 MED FILL — ALPRAZolam 1 MG TABS: 1 | 30 days supply | Qty: 90 | Fill #3

## 2018-04-12 ENCOUNTER — Ambulatory Visit (INDEPENDENT_AMBULATORY_CARE_PROVIDER_SITE_OTHER): Payer: 59 | Admitting: Family Medicine

## 2018-04-12 ENCOUNTER — Encounter: Payer: Self-pay | Admitting: Family Medicine

## 2018-04-12 ENCOUNTER — Ambulatory Visit: Payer: Self-pay | Admitting: Family Medicine

## 2018-04-12 DIAGNOSIS — B349 Viral infection, unspecified: Secondary | ICD-10-CM | POA: Diagnosis not present

## 2018-04-12 NOTE — Progress Notes (Signed)
     Virtual Visit via Video Note  Subjective  CC:  Chief Complaint  Patient presents with  . Sore Throat    Started Saturday 04/10/18  . Fever    Temp has been 99  . Cough    HPI:  I connected with Denise Rice on 04/12/18 at 11:00 AM EDT by a video enabled telemedicine application and verified that I am speaking with the correct person using two identifiers. Location patient: Home Location provider: [facility], Office Persons participating in the virtual visit: Denise Rice, [name], MD Denise Rice, [place] discussed the limitations of evaluation and management by telemedicine and the availability of in person appointments. The patient expressed understanding and agreed to proceed. . 68 yo female with ST, rare cough, no sob and body aches x 2 days. tmax 99.5. called health at works today ([employer] employee), and was told she is low risk and to call back tomorrow to see if further testing (flu, covid-19) is warranted. She denies GI complaints. Appetite is down but taking in fluids adequately. No urinary sxs. No known covid exposures but she works in [employer].   Assessment  1. Viral illness      Plan   Viral illness:  Supportive care discussed. Call [facility] tomorrow. Self isolate for now.  I discussed the assessment and treatment plan with the patient. The patient was provided an opportunity to ask questions and all were answered. The patient agreed with the plan and demonstrated an understanding of the instructions.   The patient was advised to call back or seek an in-person evaluation if the symptoms worsen or if the condition fails to improve as anticipated. Follow up: No follow-ups on file.  04/30/2018  No orders of the defined types were placed in this encounter.     I reviewed the patients updated PMH, FH, and SocHx.    Patient Active Problem List   Diagnosis Date Noted  . Bilateral sensorineural hearing loss 09/06/2014    Priority: High  .  Chronic vertigo 03/11/2017    Priority: Medium  . Major depression, chronic 03/11/2017    Priority: Medium  . PCOS (polycystic ovarian syndrome) 03/11/2017    Priority: Medium  . Insomnia 09/08/2015    Priority: Medium  . 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 History of laparoscopic adjustable gastric banding 01/22/2011    Priority: Medium  . 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 Vitamin D deficiency 01/15/2008    Priority: Low  . Small bowel obstruction ([place]) 11/17/2017   No outpatient medications have been marked as taking for the 04/12/18 encounter (Office Visit) with [name], MD.    Allergies: Patient is allergic to topiramate. Family History: Patient family history includes Cancer in her father, paternal aunt, and paternal uncle; Diabetes in her maternal aunt and sister; Heart attack in her mother; Heart disease in her maternal aunt and mother; Stroke in her sister. Social History:  Patient  reports that she has never smoked. She has never used smokeless tobacco. She reports that she does not drink alcohol or use drugs.  @OBJECTIVE /OBSERVATIONS@ Vitals: LMP 01/13/2005  General: no acute distress , A&Ox3, non-toxic appearing. No respiratory distress.   [name], MD

## 2018-04-12 NOTE — Telephone Encounter (Signed)
Pt. Reports started feeling bad Saturday.Sore throat, low grade fever, body aches, weakness, dry cough. Request a virtual visit. Denise Rice scheduled her for 11:00 today. Pt. Verbalizes understanding and will check her e-mail for instructions.  Reason for Disposition . Fever present > 3 days (72 hours)  Answer Assessment - Initial Assessment Questions 1. ONSET: "When did the throat start hurting?" (Hours or days ago)      Late Saturday 2. SEVERITY: "How bad is the sore throat?" (Scale 1-10; mild, moderate or severe)   - MILD (1-3):  doesn't interfere with eating or normal activities   - MODERATE (4-7): interferes with eating some solids and normal activities   - SEVERE (8-10):  excruciating pain, interferes with most normal activities   - SEVERE DYSPHAGIA: can't swallow liquids, drooling     Moderate 3. STREP EXPOSURE: "Has there been any exposure to strep within the past week?" If so, ask: "What type of contact occurred?"      No 4.  VIRAL SYMPTOMS: "Are there any symptoms of a cold, such as a runny nose, cough, hoarse voice or red eyes?"      No 5. FEVER: "Do you have a fever?" If so, ask: "What is your temperature, how was it measured, and when did it start?"     Low grade 6. PUS ON THE TONSILS: "Is there pus on the tonsils in the back of your throat?"     No 7. OTHER SYMPTOMS: "Do you have any other symptoms?" (e.g., difficulty breathing, headache, rash)     Body aches, weakness, dry cough 8. PREGNANCY: "Is there any chance you are pregnant?" "When was your last menstrual period?"     NO  Protocols used: SORE THROAT-A-AH

## 2018-04-26 ENCOUNTER — Other Ambulatory Visit: Payer: Self-pay | Admitting: Family Medicine

## 2018-04-26 MED FILL — BUSPIRONE HCL 7.5 MG TABS: 7.5 | 30 days supply | Qty: 90 | Fill #0

## 2018-04-28 ENCOUNTER — Other Ambulatory Visit: Payer: Self-pay | Admitting: Family Medicine

## 2018-04-28 DIAGNOSIS — R42 Dizziness and giddiness: Secondary | ICD-10-CM

## 2018-04-29 MED FILL — AJOVY 225 MG/1.5ML SOSY: 225 | 30 days supply | Qty: 2 | Fill #3

## 2018-04-29 NOTE — Telephone Encounter (Signed)
Called and spoke Mrs. Raphael, she states that she has called the pharmacy to verify and her RX bottle both states no refills.

## 2018-04-29 NOTE — Telephone Encounter (Signed)
Refilled. See note.

## 2018-04-29 NOTE — Telephone Encounter (Signed)
This RX was written 02/2018 with 5 refills. She should be able to get refills by calling her pharmacy. Please verify

## 2018-04-30 ENCOUNTER — Encounter: Payer: 59 | Admitting: Family Medicine

## 2018-05-02 MED FILL — ZOLPIDEM TARTRATE 10 MG TAB: 10 | 30 days supply | Qty: 30 | Fill #4

## 2018-05-04 MED FILL — ALPRAZolam 1 MG TABS: 1 | 30 days supply | Qty: 90 | Fill #0

## 2018-05-17 MED FILL — FLUCONAZOLE 150 MG TABS: 150 | 2 days supply | Qty: 2 | Fill #0

## 2018-05-17 MED FILL — AMOX-CLAV 875-125 MG TABLET: 875-125 | 10 days supply | Qty: 20 | Fill #0

## 2018-05-20 ENCOUNTER — Encounter: Payer: Self-pay | Admitting: Family Medicine

## 2018-05-25 MED FILL — AJOVY 225 MG/1.5ML SOSY: 225 | 30 days supply | Qty: 2 | Fill #4

## 2018-05-25 MED FILL — BUSPIRONE HCL 7.5 MG TABS: 7.5 | 30 days supply | Qty: 90 | Fill #1

## 2018-05-28 MED FILL — ZOLPIDEM TARTRATE 10 MG TAB: 10 | 30 days supply | Qty: 30 | Fill #5

## 2018-06-01 MED FILL — ALPRAZolam 1 MG TABS: 1 | 30 days supply | Qty: 90 | Fill #1

## 2018-06-03 ENCOUNTER — Other Ambulatory Visit: Payer: Self-pay

## 2018-06-03 ENCOUNTER — Encounter: Payer: Self-pay | Admitting: Family Medicine

## 2018-06-03 ENCOUNTER — Ambulatory Visit (INDEPENDENT_AMBULATORY_CARE_PROVIDER_SITE_OTHER): Payer: 59 | Admitting: Family Medicine

## 2018-06-03 VITALS — Wt 172.0 lb

## 2018-06-03 DIAGNOSIS — E669 Obesity, unspecified: Secondary | ICD-10-CM

## 2018-06-03 DIAGNOSIS — Z9884 Bariatric surgery status: Secondary | ICD-10-CM

## 2018-06-03 DIAGNOSIS — E282 Polycystic ovarian syndrome: Secondary | ICD-10-CM

## 2018-06-03 NOTE — Progress Notes (Signed)
Virtual Visit via Video Note  Subjective  CC:  Chief Complaint  Patient presents with  . Weight management referral    She has an appointment scheduled June 2nd with Caren Leafy Ro but needs referral due to insurance     I connected with [name] on 06/03/18 at 10:40 AM EDT by a video enabled telemedicine application and verified that I am speaking with the correct person using two identifiers. Location patient: Home Location provider: [facility] [place], Office Persons participating in the virtual visit: Gaylin Osoria, [name], MD Lilli Light, [place] discussed the limitations of evaluation and management by telemedicine and the availability of in person appointments. The patient expressed understanding and agreed to proceed. HPI: Denise Rice is a 68 y.o. female who was contacted today to address the problems listed above in the chief complaint. . Has gained back 10 pounds since covid-19 and gym closing. Would like to see dr. Leafy Ro to help get back on track. Has appt but needs referral. . Anxiety is well controlled.  Assessment  1. Obesity (BMI 30-39.9)   2. 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 PCOS (polycystic ovarian syndrome)      Plan   obesity:  Referral placed.  I discussed the assessment and treatment plan with the patient. The patient was provided an opportunity to ask questions and all were answered. The patient agreed with the plan and demonstrated an understanding of the instructions.   The patient was advised to call back or seek an in-person evaluation if the symptoms worsen or if the condition fails to improve as anticipated. Follow up: as scheduled for cpe  06/23/2018  No orders of the defined types were placed in this encounter.     I reviewed the patients updated PMH, FH, and SocHx.    Patient Active Problem List   Diagnosis Date Noted  . Bilateral sensorineural hearing loss 09/06/2014    Priority: High  . Chronic vertigo  03/11/2017    Priority: Medium  . Major depression, chronic 03/11/2017    Priority: Medium  . PCOS (polycystic ovarian syndrome) 03/11/2017    Priority: Medium  . Insomnia 09/08/2015    Priority: Medium  . 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 History of laparoscopic adjustable gastric banding 01/22/2011    Priority: Medium  . 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 Vitamin D deficiency 01/15/2008    Priority: Low  . Small bowel obstruction ([place]) 11/17/2017   Current Meds  Medication Sig  . acetaminophen (TYLENOL) 500 MG tablet Your can take 1000 mg every 8 hours as needed for pain.  This should be your primary pain medication.  You can alternate this with ibuprofen, or oxycodone as needed.  Do not exceed 4000 mg of Tylenol per day.  You can buy this over-the-counter at any drugstore. (Patient taking differently: Take 500 mg by mouth every 8 (eight) hours as needed (pain). This should be your primary pain medication.  Do not exceed 4000 mg of Tylenol per day.  You can buy this over-the-counter at any drugstore.)  . ALPRAZolam (XANAX) 1 MG tablet TAKE 1 TABLET BY MOUTH 3 TIMES DAILY AS NEEDED FOR ANXIETY  . Biotin 5000 MCG CAPS Take 5,000 mcg by mouth daily.   . busPIRone (BUSPAR) 7.5 MG tablet TAKE 1 TABLET BY MOUTH 3 TIMES DAILY AS NEEDED FOR ANXIETY  .  Calcium Carbonate-Vitamin D (CALCIUM 600 + D PO) Take 1 tablet by mouth daily.   . cyanocobalamin 2000 MCG tablet Take 2,000 mcg by mouth daily.  Marland Kitchen escitalopram (LEXAPRO) 20 MG tablet Take 1 tablet (20 mg total) by mouth daily.  . ferrous sulfate 325 (65 FE) MG tablet Take 325 mg by mouth daily with breakfast.  . fluticasone (FLONASE) 50 MCG/ACT nasal spray Place 2 sprays into both nostrils daily.  . Fremanezumab-vfrm (AJOVY) 225 MG/1.5ML SOSY Inject 225 mg into the skin every 30 (thirty) days.  . hydrOXYzine (ATARAX/VISTARIL) 25 MG tablet Take 1 tablet (25 mg total) by  mouth 2 (two) times daily as needed. (Patient taking differently: Take 25 mg by mouth at bedtime. )  . Hyoscyamine Sulfate SL (LEVSIN/SL) 0.125 MG SUBL Place 0.125 mg under the tongue 3 (three) times daily before meals.  Marland Kitchen ibuprofen (ADVIL,MOTRIN) 200 MG tablet You can take 2-3 tablets every 6 hours as needed for pain not relieved by plain Tylenol(acetaminophen.)  This is your second medication you can take for pain relief.  You can start this a couple hours after you take the Tylenol if you need more.  You can buy this over the counter at any drug store without a prescription. (Patient taking differently: Take 200 mg by mouth every 6 (six) hours as needed (pain). This is your second medication you can take for pain relief.  You can buy this over the counter at any drug store without a prescription.)  . Multiple Vitamin (MULTIVITAMIN WITH MINERALS) TABS tablet Take 1 tablet by mouth daily.  . ondansetron (ZOFRAN ODT) 4 MG disintegrating tablet Take 1 tablet (4 mg total) by mouth every 8 (eight) hours as needed for nausea or vomiting.  Marland Kitchen zolpidem (AMBIEN) 10 MG tablet TAKE 1 TABLET (10 MG TOTAL) BY MOUTH AT BEDTIME AS NEEDED. (Patient taking differently: Take 10 mg by mouth at bedtime. )  . [DISCONTINUED] benzonatate (TESSALON) 100 MG capsule Take 1 capsule (100 mg total) by mouth 3 (three) times daily as needed for cough.    Allergies: Patient is allergic to topiramate. Family History: Patient family history includes Cancer in her father, paternal aunt, and paternal uncle; Diabetes in her maternal aunt and sister; Heart attack in her mother; Heart disease in her maternal aunt and mother; Stroke in her sister. Social History:  Patient  reports that she has never smoked. She has never used smokeless tobacco. She reports that she does not drink alcohol or use drugs.  Review of Systems: Constitutional: Negative for fever malaise or anorexia Cardiovascular: negative for chest pain Respiratory: negative  for SOB or persistent cough Gastrointestinal: negative for abdominal pain  OBJECTIVE Vitals: Wt 172 lb (78 kg)   LMP 01/13/2005   BMI 33.31 kg/m  General: no acute distress , A&Ox3  [name], MD

## 2018-06-04 MED FILL — HYDROCODON-APAP 5-325: 5-325 | 3 days supply | Qty: 10 | Fill #0

## 2018-06-08 ENCOUNTER — Ambulatory Visit: Payer: Self-pay | Admitting: Family Medicine

## 2018-06-08 ENCOUNTER — Encounter: Payer: Self-pay | Admitting: Family Medicine

## 2018-06-08 ENCOUNTER — Ambulatory Visit (INDEPENDENT_AMBULATORY_CARE_PROVIDER_SITE_OTHER): Payer: 59 | Admitting: Family Medicine

## 2018-06-08 ENCOUNTER — Encounter: Payer: 59 | Admitting: Family Medicine

## 2018-06-08 VITALS — Temp 99.0°F | Ht 63.0 in | Wt 172.0 lb

## 2018-06-08 DIAGNOSIS — B349 Viral infection, unspecified: Secondary | ICD-10-CM | POA: Diagnosis not present

## 2018-06-08 MED FILL — hydrOXYzine HCL 25 MG TABS: 25 | 90 days supply | Qty: 180 | Fill #1

## 2018-06-08 NOTE — Telephone Encounter (Signed)
Pt. Reports she started feeling bad Saturday. Has headache,achy, temp. 99. Concerned about COVID 19. Warm transfer to [facility] in the practice for virtual visit.  Answer Assessment - Initial Assessment Questions 1. COVID-19 DIAGNOSIS: "Who made your Coronavirus (COVID-19) diagnosis?" "Was it confirmed by a positive lab test?" If not diagnosed by a HCP, ask "Are there lots of cases (community spread) where you live?" (See public health department website, if unsure)   * MAJOR community spread: high number of cases; numbers of cases are increasing; many people hospitalized.   * MINOR community spread: low number of cases; not increasing; few or no people hospitalized     No 2. ONSET: "When did the COVID-19 symptoms start?"      Started Saturday 3. WORST SYMPTOM: "What is your worst symptom?" (e.g., cough, fever, shortness of breath, muscle aches)     Tired 4. COUGH: "Do you have a cough?" If so, ask: "How bad is the cough?"       No 5. FEVER: "Do you have a fever?" If so, ask: "What is your temperature, how was it measured, and when did it start?"     Temp. 99 6. RESPIRATORY STATUS: "Describe your breathing?" (e.g., shortness of breath, wheezing, unable to speak)      No 7. BETTER-SAME-WORSE: "Are you getting better, staying the same or getting worse compared to yesterday?"  If getting worse, ask, "In what way?"     Same 8. HIGH RISK DISEASE: "Do you have any chronic medical problems?" (e.g., asthma, heart or lung disease, weak immune system, etc.)     No 9. PREGNANCY: "Is there any chance you are pregnant?" "When was your last menstrual period?"     No 10. OTHER SYMPTOMS: "Do you have any other symptoms?"  (e.g., runny nose, headache, sore throat, loss of smell)       Achy, headache  Protocols used: CORONAVIRUS (COVID-19) DIAGNOSED OR SUSPECTED-A-AH

## 2018-06-08 NOTE — Patient Instructions (Addendum)
  Call [facility] to see if you should get covid-19 testing.

## 2018-06-08 NOTE — Progress Notes (Signed)
Virtual Visit via Video Note  Subjective  CC:  Chief Complaint  Patient presents with  . URI     I connected with Denise Rice on 06/08/18 at 10:40 AM EDT by a video enabled telemedicine application and verified that I am speaking with the correct person using two identifiers. Location patient: Home Location provider: [facility], Office Persons participating in the virtual visit: Denise Rice, Denise Arnt, MD Denise Rice, Denise Rice discussed the limitations of evaluation and management by telemedicine and the availability of in person appointments. The patient expressed understanding and agreed to proceed. HPI: Denise Rice is a 68 y.o. female who was contacted today to address the problems listed above in the chief complaint. . 68 yo with 2 days of malaise with myalgias and low grade temps with mild increase in nasal congestion: no high fever, chills, cough, loss of taste nor smell, sob, n/v/d or abdominal pain. Just feel tired. Completed a course of antibiotics for a sinus infection a few weeks: had dental pain and endodontist dxd and treated. No more dental pain. Of note, her husband is in the hospital for CHF. No known covid-19 exposures.  Assessment  1. Viral illness      Plan   Viral illness, possible covid:  Mild sxs. rec calling [facility] for testing and recommend supportive care and self isolation. She will f/u for any worsening or change in sxs.  I discussed the assessment and treatment plan with the patient. The patient was provided an opportunity to ask questions and all were answered. The patient agreed with the plan and demonstrated an understanding of the instructions.   The patient was advised to call back or seek an in-person evaluation if the symptoms worsen or if the condition fails to improve as anticipated. Follow up: Return if symptoms worsen or fail to improve.  06/23/2018  No orders of the defined types were placed in this encounter.      I reviewed the patients updated PMH, FH, and SocHx.    Patient Active Problem List   Diagnosis Date Noted  . Bilateral sensorineural hearing loss 09/06/2014    Priority: High  . Chronic vertigo 03/11/2017    Priority: Medium  . Major depression, chronic 03/11/2017    Priority: Medium  . PCOS (polycystic ovarian syndrome) 03/11/2017    Priority: Medium  . Insomnia 09/08/2015    Priority: Medium  . 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 History of laparoscopic adjustable gastric banding 01/22/2011    Priority: Medium  . 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 Vitamin D deficiency 01/15/2008    Priority: Low  . Small bowel obstruction ([place]) 11/17/2017   No outpatient medications have been marked as taking for the 06/08/18 encounter (Office Visit) with Denise Arnt, MD.    Allergies: Patient is allergic to topiramate. Family History: Patient family history includes Cancer in her father, paternal aunt, and paternal uncle; Diabetes in her maternal aunt and sister; Heart attack in her mother; Heart disease in her maternal aunt and mother; Stroke in her sister. Social History:  Patient  reports that she has never smoked. She has never used smokeless tobacco. She reports that she does not drink alcohol or use drugs.  Review of Systems: Constitutional: Negative for fever malaise or anorexia Cardiovascular: negative for chest pain Respiratory: negative for SOB or persistent cough Gastrointestinal: negative  for abdominal pain  OBJECTIVE Vitals: Temp 99 F (37.2 C) (Oral)   Ht 5\' 3"  (1.6 m)   Wt 172 lb (78 kg)   LMP 01/13/2005   BMI 30.47 kg/m  General: no acute distress , A&Ox3, appears well but fatigued.   Denise Arnt, MD

## 2018-06-14 ENCOUNTER — Telehealth: Payer: Self-pay | Admitting: Family Medicine

## 2018-06-14 ENCOUNTER — Encounter: Payer: Self-pay | Admitting: *Deleted

## 2018-06-14 ENCOUNTER — Telehealth: Payer: Self-pay | Admitting: Obstetrics & Gynecology

## 2018-06-14 DIAGNOSIS — H838X9 Other specified diseases of inner ear, unspecified ear: Secondary | ICD-10-CM

## 2018-06-14 NOTE — Telephone Encounter (Signed)
Please send in referrals as requested for [facility]. Thanks.

## 2018-06-14 NOTE — Telephone Encounter (Signed)
Left message to call Shlonda Dolloff, RN at [facility] [phone].   

## 2018-06-14 NOTE — Telephone Encounter (Signed)
Spoke with patient. Patient states she was requesting OV with PCP. Patient reports she has had  f/u with PCP. Patient denies any GYN concerns at this time, thankful for return call.   Encounter closed.

## 2018-06-14 NOTE — Telephone Encounter (Signed)
Also see additional referral request for patient  Copied from [place] [id number]. Topic: Referral - Request for Referral >> Jun 14, 2018 11:43 AM Rutherford Nail, NT wrote: Has patient seen PCP for this complaint? no *If NO, is insurance requiring patient see PCP for this issue before PCP can refer them? Patient unsure Referral for which specialty: Otologist Preferred provider/office: Dr Vicie Mutters Reason for referral: States that she had a disease called superior semicircular canal dehiscence. States that her current otologist is moving. States that she would like to see Dr Vicie Mutters ASAP.

## 2018-06-14 NOTE — Telephone Encounter (Signed)
Contacted patient to discuss scheduling a virtual visit to be seen for the referral to be placed. I advised that Dr. Jonni Sanger was booked today however, offered first thing tomorrow. Patient declined and wanted Dr. Jonni Sanger only. I advised I would send a message to the clinical team to advise further.    Copied from [place] [phone]. Topic: Referral - Request for Referral >> Jun 14, 2018 11:42 AM Rutherford Nail, NT wrote: Has patient seen PCP for this complaint? no *If NO, is insurance requiring patient see PCP for this issue before PCP can refer them? Patient unsure Referral for which specialty:  Preferred provider/office: [facility] Reason for referral: Patient calling and states that she had a fall and has injured her back, around her waist. Would like to be seen ASAP.

## 2018-06-14 NOTE — Telephone Encounter (Signed)
Referral placed and Mychart message sent to pt

## 2018-06-14 NOTE — Telephone Encounter (Signed)
See below and advise please.  

## 2018-06-14 NOTE — Addendum Note (Signed)
Addended by: Layla Barter on: 06/14/2018 03:15 PM   Modules accepted: Orders

## 2018-06-14 NOTE — Telephone Encounter (Signed)
Message   Appointment Request From: [name]    With Provider: Megan Salon, MD [facility]]    Preferred Date Range: Any date 06/13/2018 or later    Preferred Times: Any time    Reason for visit: Request an Appointment    Comments:  Coughing; low grades

## 2018-06-15 ENCOUNTER — Ambulatory Visit: Payer: Self-pay

## 2018-06-15 ENCOUNTER — Ambulatory Visit (INDEPENDENT_AMBULATORY_CARE_PROVIDER_SITE_OTHER): Payer: 59 | Admitting: Family Medicine

## 2018-06-15 ENCOUNTER — Other Ambulatory Visit: Payer: Self-pay

## 2018-06-15 ENCOUNTER — Encounter: Payer: Self-pay | Admitting: Family Medicine

## 2018-06-15 DIAGNOSIS — M545 Low back pain, unspecified: Secondary | ICD-10-CM

## 2018-06-15 MED ORDER — NABUMETONE 750 MG PO TABS: 750.0000 mg | ORAL_TABLET | Freq: Two times a day (BID) | ORAL | 6 refills | Status: DC | PRN

## 2018-06-15 MED ORDER — METHOCARBAMOL 750 MG PO TABS: 750.0000 mg | ORAL_TABLET | Freq: Four times a day (QID) | ORAL | 3 refills | Status: DC | PRN

## 2018-06-15 MED FILL — NABUMETONE 750 MG TABS: 750 | 30 days supply | Qty: 60 | Fill #0

## 2018-06-15 MED FILL — METHOCARBAMOL 750 MG TABS: 750 | 15 days supply | Qty: 60 | Fill #0

## 2018-06-15 NOTE — Progress Notes (Signed)
  Denise Rice - 68 y.o. female MRN [id number]  Date of birth: January 06, 1951    SUBJECTIVE:      Chief Complaint: back pain  HPI:  68 year old female with few days of acute low back pain.  Patient has a history of vertigo due to inner ear issues.  These symptoms return over the weekend and caused her to fall approximately 5 times.  During these falls, she experienced back pain.  Her back pain is intermittent and occurs mostly if she does not extension.  This has significantly impacted her such that she is having difficulty walking around due to pain.  Her pain is bilateral but is worse on the left.  She denies any radiation into the legs.  No distal numbness or tingling in the lower extremities.  No focal weakness but she does feel weak due to the pain.  No bowel or bladder symptoms.  No saddle anesthesia.  She also notes patient having pain in the upper back to the neck as well.  No history of back pain prior to this weekend.   ROS:     See HPI. All other reviewed systems negative.  PERTINENT  PMH / PSH FH / / SH:  Past Medical, Surgical, Social, and Family History Reviewed & Updated in the EMR.   OBJECTIVE: LMP 01/13/2005   Physical Exam:  Vital signs are reviewed.  GEN: Alert and oriented, NAD Pulm: Breathing unlabored PSY: normal mood, congruent affect  MSK:  Thoracic spine: No abscess forming or swelling Focal tenderness over the T5 spinous process.  Lumbar spine: - Inspection: no gross deformity or asymmetry, swelling or ecchymosis. No skin changes - Palpation: No focal midline tenderness but she does have tenderness in the lumbosacral region centrally and bilaterally.  Worse on the left - ROM: Limited range of motion in flexion and extension.  Significant pain reproduced with extension - Strength: 5/5 strength of lower extremity in L4-S1 nerve root distributions b/l - Neuro: sensation intact in the L4-S1 nerve root distribution b/l, 2+ L4 and S1 reflexes - Special testing:  Negative straight leg raise  Bilateral hips:  - Inspection: No gross deformity, no swelling, erythema, or ecchymosis - Palpation: No TTP over the greater trochanters - ROM: Normal range of motion on Flexion abduction, internal and external rotation without pain - Strength: Normal strength. - Neuro/vasc: NV intact distally - Special Tests: Negative FABER and FADIR    ASSESSMENT & PLAN:  1.  Acute low back pain secondary to falls- x-rays of the lumbar and thoracic spine were obtained today.  No acute bony abnormalities but does show some severe degenerative disc disease.  No focal neurologic deficits on exam. - Tylenol - diclofenac - robaxin - consider PT, pt will call if symptoms not improving and will place PT referral - f/u PRN

## 2018-06-15 NOTE — Progress Notes (Signed)
I saw and examined the patient with Dr. Okey Dupre and agree with assessment and plan as outlined.  [place] multiple times due to vertigo recently.  Pain in upper thoracic and bilateral lower lumbar area.  No pain at rest, but very painful to transition.  DEXA in January showed normal spine density.  No radicular symptoms.  X-rays show degenerative changes throughout the spine.  Cannot completely rule out left-sided slight compression of around T5.  Will treat as flare-up of DJD.  Meds, possibly PT.  If not improving in a couple weeks, repeat x-ray and possibly order MRI.

## 2018-06-16 ENCOUNTER — Encounter: Payer: Self-pay | Admitting: Family Medicine

## 2018-06-16 MED ORDER — HYDROCODONE-ACETAMINOPHEN 5-325 MG PO TABS: 1.0000 | ORAL_TABLET | Freq: Four times a day (QID) | ORAL | 0 refills | Status: DC | PRN

## 2018-06-16 MED ORDER — METHYLPREDNISOLONE 4 MG PO TBPK: ORAL_TABLET | ORAL | 0 refills | Status: DC

## 2018-06-16 MED FILL — HYDROCODON-APAP 5-325: 5-325 | 5 days supply | Qty: 20 | Fill #0

## 2018-06-16 MED FILL — METHYLPREDNISOLONE 4 MG TBP: 4 | 6 days supply | Qty: 21 | Fill #0

## 2018-06-23 ENCOUNTER — Ambulatory Visit (INDEPENDENT_AMBULATORY_CARE_PROVIDER_SITE_OTHER): Payer: 59 | Admitting: Family Medicine

## 2018-06-23 ENCOUNTER — Encounter: Payer: Self-pay | Admitting: Family Medicine

## 2018-06-23 ENCOUNTER — Other Ambulatory Visit: Payer: Self-pay | Admitting: Family Medicine

## 2018-06-23 ENCOUNTER — Other Ambulatory Visit: Payer: Self-pay

## 2018-06-23 VITALS — BP 124/76 | HR 81 | Temp 97.9°F | Resp 16 | Ht 60.5 in | Wt 161.2 lb

## 2018-06-23 DIAGNOSIS — F329 Major depressive disorder, single episode, unspecified: Secondary | ICD-10-CM

## 2018-06-23 DIAGNOSIS — F5101 Primary insomnia: Secondary | ICD-10-CM

## 2018-06-23 DIAGNOSIS — R42 Dizziness and giddiness: Secondary | ICD-10-CM

## 2018-06-23 DIAGNOSIS — Z0001 Encounter for general adult medical examination with abnormal findings: Secondary | ICD-10-CM | POA: Diagnosis not present

## 2018-06-23 DIAGNOSIS — Z9884 Bariatric surgery status: Secondary | ICD-10-CM

## 2018-06-23 DIAGNOSIS — F419 Anxiety disorder, unspecified: Secondary | ICD-10-CM

## 2018-06-23 DIAGNOSIS — Z23 Encounter for immunization: Secondary | ICD-10-CM | POA: Diagnosis not present

## 2018-06-23 DIAGNOSIS — Z0000 Encounter for general adult medical examination without abnormal findings: Secondary | ICD-10-CM

## 2018-06-23 DIAGNOSIS — E559 Vitamin D deficiency, unspecified: Secondary | ICD-10-CM

## 2018-06-23 LAB — COMPREHENSIVE METABOLIC PANEL
ALT: 17 U/L (ref 0–35)
AST: 24 U/L (ref 0–37)
Albumin: 4.1 g/dL (ref 3.5–5.2)
Alkaline Phosphatase: 81 U/L (ref 39–117)
BUN: 16 mg/dL (ref 6–23)
CO2: 27 mEq/L (ref 19–32)
Calcium: 8.5 mg/dL (ref 8.4–10.5)
Chloride: 105 mEq/L (ref 96–112)
Creatinine, Ser: 0.73 mg/dL (ref 0.40–1.20)
GFR: 79.86 mL/min (ref >60.00)
Glucose, Bld: 94 mg/dL (ref 70–99)
Potassium: 4.1 mEq/L (ref 3.5–5.1)
Sodium: 141 mEq/L (ref 135–145)
Total Bilirubin: 0.4 mg/dL (ref 0.2–1.2)
Total Protein: 6.9 g/dL (ref 6.0–8.3)

## 2018-06-23 LAB — CBC WITH DIFFERENTIAL/PLATELET
Basophils Absolute: 0.1 10*3/uL (ref 0.0–0.1)
Basophils Relative: 0.9 % (ref 0.0–3.0)
Eosinophils Absolute: 0.1 10*3/uL (ref 0.0–0.7)
Eosinophils Relative: 0.8 % (ref 0.0–5.0)
HCT: 38.7 % (ref 36.0–46.0)
Hemoglobin: 13.1 g/dL (ref 12.0–15.0)
Lymphocytes Relative: 23.5 % (ref 12.0–46.0)
Lymphs Abs: 1.8 10*3/uL (ref 0.7–4.0)
MCHC: 33.9 g/dL (ref 30.0–36.0)
MCV: 79.9 fl (ref 78.0–100.0)
Monocytes Absolute: 0.5 10*3/uL (ref 0.1–1.0)
Monocytes Relative: 6.1 % (ref 3.0–12.0)
Neutro Abs: 5.2 10*3/uL (ref 1.4–7.7)
Neutrophils Relative %: 68.7 % (ref 43.0–77.0)
Platelets: 256 10*3/uL (ref 150.0–400.0)
RBC: 4.84 Mil/uL (ref 3.87–5.11)
RDW: 14.4 % (ref 11.5–15.5)
WBC: 7.5 10*3/uL (ref 4.0–10.5)

## 2018-06-23 LAB — VITAMIN B12: Vitamin B-12: 349 pg/mL (ref 211–911)

## 2018-06-23 LAB — LIPID PANEL
Cholesterol: 164 mg/dL (ref 0–200)
HDL: 75.3 mg/dL (ref >39.00)
LDL Cholesterol: 69 mg/dL (ref 0–99)
NonHDL: 88.74
Total CHOL/HDL Ratio: 2
Triglycerides: 101 mg/dL (ref 0.0–149.0)
VLDL: 20.2 mg/dL (ref 0.0–40.0)

## 2018-06-23 LAB — VITAMIN D 25 HYDROXY (VIT D DEFICIENCY, FRACTURES): VITD: 33.57 ng/mL (ref 30.00–100.00)

## 2018-06-23 LAB — TSH: TSH: 1.47 u[IU]/mL (ref 0.35–4.50)

## 2018-06-23 MED ORDER — ZOLPIDEM TARTRATE 10 MG PO TABS: 10.0000 mg | ORAL_TABLET | Freq: Every evening | ORAL | 5 refills | Status: DC | PRN

## 2018-06-23 MED ORDER — DIAZEPAM 10 MG PO TABS: 10.0000 mg | ORAL_TABLET | Freq: Two times a day (BID) | ORAL | 5 refills | Status: DC

## 2018-06-23 MED FILL — AJOVY 225 MG/1.5ML SOSY: 225 | 30 days supply | Qty: 2 | Fill #5

## 2018-06-23 MED FILL — ZOLPIDEM TARTRATE 10 MG TAB: 10 | 30 days supply | Qty: 30 | Fill #0

## 2018-06-23 MED FILL — DIAZEPAM 10 MG TABS: 10 | 60 days supply | Qty: 60 | Fill #0

## 2018-06-23 NOTE — Progress Notes (Signed)
Subjective  Chief Complaint  Patient presents with  . Annual Exam    Not fasting  . Anxiety    Having to use Alprazolam more, needing an early refill.Marland Kitchen GAD 7 score today is 14.  . Insomnia    Taking Ambien nightly, no problems. Needs refill    HPI: Denise Rice is a 68 y.o. female who presents to [place] at [facility] today for a Female Wellness Visit. She also has the concerns and/or needs as listed above in the chief complaint. These will be addressed in addition to the Health Maintenance Visit.   Wellness Visit: annual visit with health maintenance review and exam without Pap   HM: pap and mammo and CRC are up to date. Stressed! Multiple reasons.  Chronic disease f/u and/or acute problem visit: (deemed necessary to be done in addition to the wellness visit):  Anxiety: Has had chronic anxiety and has been on Xanax for multiple years 1 mg 3 times daily.  Also on BuSpar and hydroxyzine.  BuSpar may be helpful although hydroxyzine is not that helpful but could be helping sleep at times.  She has been eating more Xanax due to increased stress: Her husband has been in the hospital and will likely need cardiac surgery.  He also may end up on dialysis.  She is his primary caregiver.  Also multiple stressors due to COVID-19 pandemic, and still working to get through grieving the loss of her sister from last year.  Work tends to be stressful as well.  She reports her sleep is good.  She does take Ambien nightly for this.  As well, vertigo has been very active.  She suffered multiple falls a few weeks ago and had a back or neck injury followed by Ortho.  Fortunately this is doing better.  Major depression: As above, struggling but feels that mood medicine is working well.  History of gastric bypass: History of vitamin deficiencies.  Due for recheck today.  She is on supplements.  Weight is stable  Chronic vertigo: Sees ENT regularly.  Assessment  1. Annual physical exam   2.  Chronic vertigo   3. Major depression, chronic   4. Vitamin D deficiency   5. Anxiety   6. 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 Primary insomnia      Plan  Female Wellness Visit:  Age appropriate Health Maintenance and Prevention measures were discussed with patient. Included topics are cancer screening recommendations, ways to keep healthy (see AVS) including dietary and exercise recommendations, regular eye and dental care, use of seat belts, and avoidance of moderate alcohol use and tobacco use.   BMI: discussed patient's BMI and encouraged positive lifestyle modifications to help get to or maintain a target BMI.  HM needs and immunizations were addressed and ordered. See below for orders. See HM and immunization section for updates.  Pneumovax updated today  Routine labs and screening tests ordered including cmp, cbc and lipids where appropriate.  Discussed recommendations regarding Vit D and calcium supplementation (see AVS)  Chronic disease management visit and/or acute problem visit:  Anxiety: Worsening due to multiple stressors.  Counseling done.  I am uncomfortable increasing Xanax.  Recommend changing to long-acting Valium once or twice daily (also will help vertigo).  Continue BuSpar and hydroxyzine.  Follow-up in the office in 4 weeks.  She will need a controlled substance agreement at that time  Continue Ambien for sleep refill today  Check labs including vitamin levels. Follow up: Return  in about 4 weeks (around 07/21/2018) for mood follow up.  Orders Placed This Encounter  Procedures  . Pneumococcal polysaccharide vaccine 23-valent greater than or equal to 2yo subcutaneous/IM  . CBC with Differential/Platelet  . Comprehensive metabolic panel  . Lipid panel  . TSH  . Vitamin B12  . VITAMIN D 25 Hydroxy (Vit-D Deficiency, Fractures)   Meds ordered this encounter  Medications  . zolpidem (AMBIEN) 10 MG tablet    Sig: Take 1 tablet (10 mg total) by mouth at bedtime as  needed for sleep.    Dispense:  30 tablet    Refill:  5  . diazepam (VALIUM) 10 MG tablet    Sig: Take 1 tablet (10 mg total) by mouth 2 (two) times a day.    Dispense:  60 tablet    Refill:  5    Stopping xanax; chaning to valium      Lifestyle: Body mass index is 30.96 kg/m. Wt Readings from Last 3 Encounters:  06/23/18 161 lb 3.2 oz (73.1 kg)  06/08/18 172 lb (78 kg)  06/03/18 172 lb (78 kg)     Patient Active Problem List   Diagnosis Date Noted  . Bilateral sensorineural hearing loss 09/06/2014    Priority: High  . Chronic vertigo 03/11/2017    Priority: Medium  . Major depression, chronic 03/11/2017    Priority: Medium  . PCOS (polycystic ovarian syndrome) 03/11/2017    Priority: Medium  . Insomnia 09/08/2015    Priority: Medium  . 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 History of laparoscopic adjustable gastric banding 01/22/2011    Priority: Medium  . 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 Vitamin D deficiency 01/15/2008    Priority: Low    Overview:  Vitamin D Deficiency  10/1 IMO update   . Anxiety 03/14/2018  . Elschnig bodies following cataract surgery, 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  . Small bowel obstruction ([place]) 11/17/2017   Health Maintenance  Topic Date Due  . INFLUENZA VACCINE  08/14/2018  . PNA vac Low Risk Adult (2 of 2 - PPSV23) 08/16/2018  . MAMMOGRAM  10/22/2018  . PAP SMEAR-Modifier  09/12/2021  . COLONOSCOPY  10/11/2025  . TETANUS/TDAP  08/13/2026  . DEXA SCAN  Completed  . Hepatitis C Screening  Completed  . HIV Screening  Completed   Immunization History  Administered Date(s) Administered  . Hepatitis A 08/01/2015, 02/06/2016  . Hepatitis B 08/01/2015, 10/03/2015, 02/06/2016  . Influenza-Unspecified 09/29/2014, 09/26/2016  . Pneumococcal Conjugate-13 08/25/2014  . Pneumococcal Polysaccharide-23 08/15/2013  . Td 02/14/1995  . Tdap 08/12/2016  . Zoster 05/19/2013    We updated and reviewed the patient's past history in detail and it is documented below. Allergies: Patient is allergic to topiramate. 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 Past Surgical History Patient  has a past surgical history that includes Vaginal hysterectomy (2007); Roux-en-Y Gastric Bypass (AUG 2013); Laparoscopic gastric banding (02-08-2007); Strabismus surgery (Right); Laparoscopic cholecystectomy (03-31-2005); Implantation bone anchored hearing aid (Left, 07/27/2008); Stapedes surgery (Left, 1985;  1994 x2;  02-03-1998); Blepharoplasty (03-05-2000); Laser ablation condolamata (N/A, 07/30/2012); Hammer toe  surgery (Left, 08-29-2013); LASIK (Bilateral, 2002); Craniotomy (08/2014); laparoscopy (N/A, 11/17/2017); and Lysis of adhesion (N/A, 11/17/2017). Family History: Patient family history includes Cancer in her father, paternal aunt, and paternal uncle; Diabetes in her maternal aunt and sister; Heart attack in her mother; Heart disease in her maternal aunt and mother; Stroke in her sister. Social History:  Patient  reports that she has never smoked. She has never used smokeless tobacco. She reports that she does not drink alcohol or use drugs.  Review of Systems: Constitutional: negative for fever or malaise Ophthalmic: negative for photophobia, double vision or loss of vision Cardiovascular: negative for chest pain, dyspnea on exertion, or new LE swelling Respiratory: negative for SOB or persistent cough Gastrointestinal: negative for abdominal pain, change in bowel habits or melena  Genitourinary: negative for dysuria or gross hematuria, no abnormal uterine bleeding or disharge Musculoskeletal: negative for new gait disturbance or muscular weakness Integumentary: negative for new or persistent rashes, no breast lumps Neurological: negative for TIA or stroke symptoms Psychiatric: negative for SI or delusions Allergic/Immunologic: negative for hives  Patient Care Team    Relationship Specialty Notifications Start End  [name], MD PCP - General Family Medicine  03/11/17   Megan Salon, MD Consulting Physician Gynecology  03/11/17   Melvenia Beam, MD Consulting Physician Neurology  11/25/17     Objective  Vitals: BP 124/76   Pulse 81   Temp 97.9 F (36.6 C) (Oral)   Resp 16   Ht 5' 0.5" (1.537 m)   Wt 161 lb 3.2 oz (73.1 kg)   LMP 01/13/2005   SpO2 98%   BMI 30.96 kg/m  General:  Well developed, well nourished, no acute distress  Psych:  Alert and orientedx3,normal mood and affect, good insight HEENT:  Normocephalic, atraumatic, non-icteric sclera, PERRL, oropharynx is clear without mass or exudate, supple neck without adenopathy, mass or thyromegaly Cardiovascular:  Normal S1, S2, RRR without gallop, rub or murmur, nondisplaced PMI Respiratory:  Good breath sounds bilaterally, CTAB with normal respiratory effort Gastrointestinal: normal bowel sounds, soft, non-tender, no noted masses. No HSM MSK: no deformities, contusions. Joints are without erythema or swelling. Spine and CVA region are nontender Skin:  Warm, no rashes or suspicious lesions noted Neurologic:    Mental status is normal. CN 2-11 are normal. Gross motor and sensory exams are normal. Normal gait. No tremor Breast Exam: No mass, skin retraction or nipple discharge is appreciated in either breast. No axillary adenopathy. Fibrocystic changes are not noted, small cyst left breast pea sized mobile and tender. Pt says chronic and has been imaged before.     Commons side effects, risks,  benefits, and alternatives for medications and treatment plan prescribed today were discussed, and the patient expressed understanding of the given instructions. Patient is instructed to call or message via MyChart if he/she has any questions or concerns regarding our treatment plan. No barriers to understanding were identified. We discussed Red Flag symptoms and signs in detail. Patient expressed understanding regarding what to do in case of urgent or emergency type symptoms.   Medication list was reconciled, printed and provided to the patient in AVS. Patient instructions and summary information was reviewed with the patient as documented in the AVS. This note was prepared with assistance of Dragon voice recognition software. Occasional wrong-word or sound-a-like substitutions may have occurred due to the inherent limitations of voice recognition software

## 2018-06-23 NOTE — Patient Instructions (Addendum)
Please return in 4 weeks to recheck anxiety.   I will release your lab results to you on your MyChart account with further instructions. Please reply with any questions.   Today you were given your Pneumovax vaccination.   If you have any questions or concerns, please don't hesitate to send me a message via MyChart or call the office at [phone]. Thank you for visiting with [place] today! It's our pleasure caring for you.   Preventive Care 2 Years and Older, Female Preventive care refers to lifestyle choices and visits with your health care provider that can promote health and wellness. What does preventive care include?  A yearly physical exam. This is also called an annual well check.  Dental exams once or twice a year.  Routine eye exams. Ask your health care provider how often you should have your eyes checked.  Personal lifestyle choices, including: ? Daily care of your teeth and gums. ? Regular physical activity. ? Eating a healthy diet. ? Avoiding tobacco and drug use. ? Limiting alcohol use. ? Practicing safe sex. ? Taking low-dose aspirin every day. ? Taking vitamin and mineral supplements as recommended by your health care provider. What happens during an annual well check? The services and screenings done by your health care provider during your annual well check will depend on your age, overall health, lifestyle risk factors, and family history of disease. Counseling Your health care provider may ask you questions about your:  Alcohol use.  Tobacco use.  Drug use.  Emotional well-being.  Home and relationship well-being.  Sexual activity.  Eating habits.  History of falls.  Memory and ability to understand (cognition).  Work and work Statistician.  Reproductive health.  Screening You may have the following tests or measurements:  Height, weight, and BMI.  Blood pressure.  Lipid and cholesterol levels. These may be checked every 5 years, or more  frequently if you are over 33 years old.  Skin check.  Lung cancer screening. You may have this screening every year starting at age 38 if you have a 30-pack-year history of smoking and currently smoke or have quit within the past 15 years.  Colorectal cancer screening. All adults should have this screening starting at age 16 and continuing until age 57. You will have tests every 1-10 years, depending on your results and the type of screening test. People at increased risk should start screening at an earlier age. Screening tests may include: ? Guaiac-based fecal occult blood testing. ? Fecal immunochemical test (FIT). ? Stool DNA test. ? Virtual colonoscopy. ? Sigmoidoscopy. During this test, a flexible tube with a tiny camera (sigmoidoscope) is used to examine your rectum and lower colon. The sigmoidoscope is inserted through your anus into your rectum and lower colon. ? Colonoscopy. During this test, a long, thin, flexible tube with a tiny camera (colonoscope) is used to examine your entire colon and rectum.  Hepatitis C blood test.  Hepatitis B blood test.  Sexually transmitted disease (STD) testing.  Diabetes screening. This is done by checking your blood sugar (glucose) after you have not eaten for a while (fasting). You may have this done every 1-3 years.  Bone density scan. This is done to screen for osteoporosis. You may have this done starting at age 36.  Mammogram. This may be done every 1-2 years. Talk to your health care provider about how often you should have regular mammograms. Talk with your health care provider about your test results, treatment options, and  if necessary, the need for more tests. Vaccines Your health care provider may recommend certain vaccines, such as:  Influenza vaccine. This is recommended every year.  Tetanus, diphtheria, and acellular pertussis (Tdap, Td) vaccine. You may need a Td booster every 10 years.  Varicella vaccine. You may need this  if you have not been vaccinated.  Zoster vaccine. You may need this after age 32.  Measles, mumps, and rubella (MMR) vaccine. You may need at least one dose of MMR if you were born in 1957 or later. You may also need a second dose.  Pneumococcal 13-valent conjugate (PCV13) vaccine. One dose is recommended after age 49.  Pneumococcal polysaccharide (PPSV23) vaccine. One dose is recommended after age 70.  Meningococcal vaccine. You may need this if you have certain conditions.  Hepatitis A vaccine. You may need this if you have certain conditions or if you travel or work in places where you may be exposed to hepatitis A.  Hepatitis B vaccine. You may need this if you have certain conditions or if you travel or work in places where you may be exposed to hepatitis B.  Haemophilus influenzae type b (Hib) vaccine. You may need this if you have certain conditions. Talk to your health care provider about which screenings and vaccines you need and how often you need them. This information is not intended to replace advice given to you by your health care provider. Make sure you discuss any questions you have with your health care provider. Document Released: 01/26/2015 Document Revised: 02/19/2017 Document Reviewed: 10/31/2014 Elsevier Interactive Patient Education  2019 [employer].

## 2018-06-24 ENCOUNTER — Other Ambulatory Visit: Payer: Self-pay | Admitting: Family Medicine

## 2018-06-24 ENCOUNTER — Encounter: Payer: Self-pay | Admitting: Family Medicine

## 2018-06-24 MED ORDER — "SYRINGE 25G X 1"" 3 ML MISC": 0 refills | Status: DC

## 2018-06-24 MED ORDER — CYANOCOBALAMIN 1000 MCG/ML IJ SOLN: 1000.0000 ug | INTRAMUSCULAR | 12 refills | Status: DC

## 2018-06-24 MED ORDER — VITAMIN D (ERGOCALCIFEROL) 1.25 MG (50000 UNIT) PO CAPS: 50000.0000 [IU] | ORAL_CAPSULE | ORAL | 0 refills | Status: DC

## 2018-06-24 MED FILL — VIT D2 1.25 MG (50,000 UNIT: 1.25 MG | 84 days supply | Qty: 12 | Fill #0

## 2018-06-24 MED FILL — BD 3 ML SYRINGE 25GX1: 25G X 1" | 12 days supply | Qty: 12 | Fill #0

## 2018-06-24 MED FILL — CYANOCOBALAMIN 1,000 MCG/ML: 1000 | 30 days supply | Qty: 1 | Fill #0

## 2018-06-24 MED FILL — BD 3 ML SYRINGE 25GX1": 25G X 1" | 12 days supply | Qty: 12 | Fill #0

## 2018-06-24 NOTE — Progress Notes (Signed)
Mychart message sent to pt to see which she would prefer

## 2018-06-24 NOTE — Progress Notes (Unsigned)
Please set pt for monthly Vitamin B12 injections.  Can do at home if she is able or schedule for shots here.   Thanks.

## 2018-06-24 NOTE — Telephone Encounter (Signed)
Self-Administering B-12 Step 1 Thoroughly wash your hands before handling your supplies. Clean hands will limit contamination of the product and of the injection site. If you are giving a injection to someone else, wear latex gloves.  Step 2 Gather your B-12 medication vial, a 1 ml syringe and a 22- to 25-gauge needle that's 1 to 1 1/2 inches long.   Step 3 Attach the needle to the syringe. Make sure the needle locks onto the syringe by first inserting then turning it until it securely locks in place.  Step 4 Prepare the injection. Uncap the B-12 vial and wipe the top of it with an alcohol swab. Then draw an amount of air equal to the volume of your injection into the syringe. For example, if your dose is 1 ml, pull back the plunger on your syringe to the 1 ml mark. Pick up the vial and insert the needle of the syringe into the vial at a 90-degree angle. This will prevent coring--the introduction of pieces of the vial's rubber stopper into the vial. Inject the air into the vial and, after inverting the vial, draw the appropriate volume of B-12 solution into the syringe by pulling back the plunger. Withdraw the syringe and needle from the vial.  Step 5 Choose your injection site. Intramuscular shots may be given in the upper arm, thigh or buttocks. Prepare the injection site by cleaning it with alcohol.  Step 6 Inject the medication into the muscle by inserting the needle at a 90-degree angle using a quick and smooth motion. Then depress the plunger, slowly releasing all of the medication into the muscle. Withdraw the needle and discard it in a sharps container.  Step 7 Apply pressure to the injection site using a cotton ball to reduce bleeding. Apply an adhesive bandage if needed.

## 2018-06-29 MED FILL — ALPRAZolam 1 MG TABS: 1 | 30 days supply | Qty: 90 | Fill #2

## 2018-07-02 ENCOUNTER — Encounter: Payer: Self-pay | Admitting: Family Medicine

## 2018-07-02 ENCOUNTER — Ambulatory Visit (INDEPENDENT_AMBULATORY_CARE_PROVIDER_SITE_OTHER): Payer: 59 | Admitting: Family Medicine

## 2018-07-02 DIAGNOSIS — F419 Anxiety disorder, unspecified: Secondary | ICD-10-CM | POA: Diagnosis not present

## 2018-07-02 DIAGNOSIS — J320 Chronic maxillary sinusitis: Secondary | ICD-10-CM

## 2018-07-02 DIAGNOSIS — R42 Dizziness and giddiness: Secondary | ICD-10-CM

## 2018-07-02 MED ORDER — AMOXICILLIN-POT CLAVULANATE 875-125 MG PO TABS: 1.0000 | ORAL_TABLET | Freq: Two times a day (BID) | ORAL | 0 refills | Status: DC

## 2018-07-02 MED FILL — AMOX-CLAV 875-125 MG TABLET: 875-125 | 10 days supply | Qty: 20 | Fill #0

## 2018-07-02 NOTE — Progress Notes (Signed)
Virtual Visit via Video Note  Subjective  CC:  Chief Complaint  Patient presents with  . Sinus Problem    Facial pain, headache, and teeth pain.Martin Majestic to dentist and was put on (amox) for 10 day     I connected with [name] on 07/02/18 at  3:00 PM EDT by a video enabled telemedicine application and verified that I am speaking with the correct person using two identifiers. Location patient: Home Location provider: [facility] [place], Office Persons participating in the virtual visit: Roxene Alviar, [name], MD Lilli Light, [place] discussed the limitations of evaluation and management by telemedicine and the availability of in person appointments. The patient expressed understanding and agreed to proceed. HPI: Denise Rice is a 68 y.o. female who was contacted today to address the problems listed above in the chief complaint. . Last month, patient had left-sided facial pain and tooth pain.  She saw her endodontist who did x-rays and found left maxillary sinusitis is the culprit.  Treated with 10-day course of amoxicillin.  Symptoms improved significantly.  However, she awoke yesterday with the same symptoms.  Left-sided facial pain and dental pain.  Minor congestion.  No fevers or chills or cough.  The endodontist did warn her that if symptoms return she would need to be back on antibiotics. . Anxiety and vertigo: At last visit we changed to Klonopin twice daily.  Patient reports that she feels like a different person.  Anxiety is now well controlled.  Sleeping better.  Vertigo is less.  She is able to be more active because of this.  Her mood is thus improved.  No highs and lows that she was having on Xanax. Assessment  1. Left maxillary sinusitis   2. Chronic vertigo   3. Anxiety      Plan   Recurrent or persistent left maxillary sinusitis: Augmentin x10 days.  Will continue for another week if symptoms rebound.  Vertigo and anxiety are much better  controlled on long-acting Klonopin. I discussed the assessment and treatment plan with the patient. The patient was provided an opportunity to ask questions and all were answered. The patient agreed with the plan and demonstrated an understanding of the instructions.   The patient was advised to call back or seek an in-person evaluation if the symptoms worsen or if the condition fails to improve as anticipated. Follow up: No follow-ups on file.  07/27/2018  Meds ordered this encounter  Medications  . amoxicillin-clavulanate (AUGMENTIN) 875-125 MG tablet    Sig: Take 1 tablet by mouth 2 (two) times daily.    Dispense:  20 tablet    Refill:  0      I reviewed the patients updated PMH, FH, and SocHx.    Patient Active Problem List   Diagnosis Date Noted  . Bilateral sensorineural hearing loss 09/06/2014    Priority: High  . Chronic vertigo 03/11/2017    Priority: Medium  . Major depression, chronic 03/11/2017    Priority: Medium  . PCOS (polycystic ovarian syndrome) 03/11/2017    Priority: Medium  . Insomnia 09/08/2015    Priority: Medium  . 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 History of laparoscopic adjustable gastric banding 01/22/2011    Priority: Medium  . 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 Vitamin D deficiency 01/15/2008    Priority: Low  . Anxiety 03/14/2018  .  Elschnig bodies following cataract surgery, 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  . Small bowel obstruction ([place]) 11/17/2017   Current Meds  Medication Sig  . acetaminophen (TYLENOL) 500 MG tablet Your can take 1000 mg every 8 hours as needed for pain.  This should be your primary pain medication.  You can alternate this with ibuprofen, or oxycodone as needed.  Do not exceed 4000 mg of Tylenol per day.  You can buy this over-the-counter at any drugstore. (Patient taking differently: Take 500 mg by mouth every 8 (eight) hours as needed (pain). This should be  your primary pain medication.  Do not exceed 4000 mg of Tylenol per day.  You can buy this over-the-counter at any drugstore.)  . Biotin 5000 MCG CAPS Take 5,000 mcg by mouth daily.   . busPIRone (BUSPAR) 7.5 MG tablet TAKE 1 TABLET BY MOUTH 3 TIMES DAILY AS NEEDED FOR ANXIETY  . Calcium Carbonate-Vitamin D (CALCIUM 600 + D PO) Take 1 tablet by mouth daily.   . cyanocobalamin (,VITAMIN B-12,) 1000 MCG/ML injection Inject 1 mL (1,000 mcg total) into the muscle every 30 (thirty) days.  . cyanocobalamin 2000 MCG tablet Take 2,000 mcg by mouth daily.  . diazepam (VALIUM) 10 MG tablet Take 1 tablet (10 mg total) by mouth 2 (two) times a day.  . escitalopram (LEXAPRO) 20 MG tablet Take 1 tablet (20 mg total) by mouth daily.  . ferrous sulfate 325 (65 FE) MG tablet Take 325 mg by mouth daily with breakfast.  . Fremanezumab-vfrm (AJOVY) 225 MG/1.5ML SOSY Inject 225 mg into the skin every 30 (thirty) days.  . hydrOXYzine (ATARAX/VISTARIL) 25 MG tablet Take 1 tablet (25 mg total) by mouth 2 (two) times daily as needed. (Patient taking differently: Take 25 mg by mouth at bedtime. )  . Multiple Vitamin (MULTIVITAMIN WITH MINERALS) TABS tablet Take 1 tablet by mouth daily.  . Syringe/Needle, Disp, (SYRINGE 3CC/25GX1") 25G X 1" 3 ML MISC Use once monthly for injection of Viatmin B-12  . Vitamin D, Ergocalciferol, (DRISDOL) 1.25 MG (50000 UT) CAPS capsule Take 1 capsule (50,000 Units total) by mouth once a week.  . zolpidem (AMBIEN) 10 MG tablet Take 1 tablet (10 mg total) by mouth at bedtime as needed for sleep.    Allergies: Patient is allergic to topiramate. Family History: Patient family history includes Cancer in her father, paternal aunt, and paternal uncle; Diabetes in her maternal aunt and sister; Heart attack in her mother; Heart disease in her maternal aunt and mother; Stroke in her sister. Social History:  Patient  reports that she has never smoked. She has never used smokeless tobacco. She reports  that she does not drink alcohol or use drugs.  Review of Systems: Constitutional: Negative for fever malaise or anorexia Cardiovascular: negative for chest pain Respiratory: negative for SOB or persistent cough Gastrointestinal: negative for abdominal pain  OBJECTIVE Vitals: LMP 01/13/2005  General: no acute distress , A&Ox3, no distress  [name], MD

## 2018-07-14 ENCOUNTER — Other Ambulatory Visit: Payer: Self-pay | Admitting: Family Medicine

## 2018-07-14 MED FILL — BUSPIRONE HCL 7.5 MG TABS: 7.5 | 30 days supply | Qty: 90 | Fill #0

## 2018-07-19 MED FILL — ZOLPIDEM TARTRATE 10 MG TAB: 10 | 30 days supply | Qty: 30 | Fill #1

## 2018-07-19 MED FILL — AJOVY 225 MG/1.5ML SOSY: 225 | 30 days supply | Qty: 2 | Fill #6

## 2018-07-19 MED FILL — CYANOCOBALAMIN 1,000 MCG/ML: 1000 | 30 days supply | Qty: 1 | Fill #1

## 2018-07-21 MED FILL — DIAZEPAM 10 MG TABS: 10 | 30 days supply | Qty: 60 | Fill #1

## 2018-07-27 ENCOUNTER — Ambulatory Visit: Payer: 59 | Admitting: Family Medicine

## 2018-07-29 ENCOUNTER — Ambulatory Visit (INDEPENDENT_AMBULATORY_CARE_PROVIDER_SITE_OTHER): Payer: 59 | Admitting: Family Medicine

## 2018-07-29 ENCOUNTER — Encounter: Payer: Self-pay | Admitting: Family Medicine

## 2018-07-29 VITALS — Wt 172.0 lb

## 2018-07-29 DIAGNOSIS — E559 Vitamin D deficiency, unspecified: Secondary | ICD-10-CM

## 2018-07-29 DIAGNOSIS — Z9884 Bariatric surgery status: Secondary | ICD-10-CM | POA: Diagnosis not present

## 2018-07-29 DIAGNOSIS — R42 Dizziness and giddiness: Secondary | ICD-10-CM | POA: Diagnosis not present

## 2018-07-29 DIAGNOSIS — F419 Anxiety disorder, unspecified: Secondary | ICD-10-CM

## 2018-07-29 DIAGNOSIS — E538 Deficiency of other specified B group vitamins: Secondary | ICD-10-CM | POA: Diagnosis not present

## 2018-07-29 HISTORY — DX: Deficiency of other specified B group vitamins: E53.8

## 2018-07-29 MED FILL — ALPRAZolam 1 MG TABS: 1 | 30 days supply | Qty: 90 | Fill #3

## 2018-07-29 NOTE — Progress Notes (Signed)
Virtual Visit via Video Note  Subjective  CC:  Chief Complaint  Patient presents with  . Anxiety    Reports that she is feeling much better and sleeping well with changing from Xanax to Diazepam     I connected with [name] on 07/29/18 at  1:20 PM EDT by a video enabled telemedicine application and verified that I am speaking with the correct person using two identifiers. Location patient: Home Location provider: [facility] [place], Office Persons participating in the virtual visit: Denise Rice, [name], MD Lilli Light, [place] discussed the limitations of evaluation and management by telemedicine and the availability of in person appointments. The patient expressed understanding and agreed to proceed. HPI: Denise Rice is a 68 y.o. female who was contacted today to address the problems listed above in the chief complaint. . F/u worsening anxiety and vertigo; 4 weeks ago stopped tid xanax and replaced with bid valium: pt feels 100% better. Anxiety is well controlled w/o rebound sxs. Sleep is good. Vertigo has also improved. Says she "feels like a brand new person" . Vit D and vit B12 deficiency: now giving herself IM vit B12 injections monthly and started high dose Vit D supplements.  Assessment  1. Anxiety   2. Chronic vertigo   3. Vitamin D deficiency   4. Vitamin B12 deficiency   5. 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 Continue.   Will recheck vitamin levels in 3 months.  I discussed the assessment and treatment plan with the patient. The patient was provided an opportunity to ask questions and all were answered. The patient agreed with the plan and demonstrated an understanding of the instructions.   The patient was advised to call back or seek an in-person evaluation if the symptoms worsen or if the condition fails to improve as anticipated. Follow up:3 mo Visit date not found   No orders of the defined types were placed in this encounter.     I reviewed the patients updated PMH, FH, and SocHx.    Patient Active Problem List   Diagnosis Date Noted  . Bilateral sensorineural hearing loss 09/06/2014    Priority: High  . Chronic vertigo 03/11/2017    Priority: Medium  . Major depression, chronic 03/11/2017    Priority: Medium  . PCOS (polycystic ovarian syndrome) 03/11/2017    Priority: Medium  . Insomnia 09/08/2015    Priority: Medium  . 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 History of laparoscopic adjustable gastric banding 01/22/2011    Priority: Medium  . 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 Vitamin D deficiency 01/15/2008    Priority: Low  . Vitamin B12 deficiency 07/29/2018  . Anxiety 03/14/2018  . Elschnig bodies following cataract surgery, 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  . Small bowel obstruction ([place]) 11/17/2017   Current Meds  Medication Sig  . acetaminophen (TYLENOL) 500 MG tablet Your can take 1000 mg every 8 hours as needed for pain.  This should be your primary pain medication.  You can alternate this with ibuprofen, or oxycodone as needed.  Do not exceed 4000 mg of Tylenol per day.  You can buy this over-the-counter at any drugstore. (Patient taking differently: Take 500 mg by mouth every 8 (eight) hours as needed (pain). This should be your primary pain medication.  Do not exceed 4000 mg of Tylenol per day.  You can buy this over-the-counter at any drugstore.)  . Biotin 5000 MCG CAPS Take 5,000 mcg by mouth daily.   . busPIRone (BUSPAR) 7.5 MG tablet TAKE 1 TABLET BY MOUTH 3 TIMES DAILY AS NEEDED FOR ANXIETY  . Calcium Carbonate-Vitamin D (CALCIUM 600 + D PO) Take 1 tablet by mouth daily.   . cyanocobalamin (,VITAMIN B-12,) 1000 MCG/ML injection Inject 1 mL (1,000 mcg total) into the muscle every 30 (thirty) days.  . cyanocobalamin 2000 MCG tablet Take 2,000 mcg by mouth daily.  .  diazepam (VALIUM) 10 MG tablet Take 1 tablet (10 mg total) by mouth 2 (two) times a day.  . escitalopram (LEXAPRO) 20 MG tablet Take 1 tablet (20 mg total) by mouth daily.  . ferrous sulfate 325 (65 FE) MG tablet Take 325 mg by mouth daily with breakfast.  . Fremanezumab-vfrm (AJOVY) 225 MG/1.5ML SOSY Inject 225 mg into the skin every 30 (thirty) days.  . hydrOXYzine (ATARAX/VISTARIL) 25 MG tablet Take 1 tablet (25 mg total) by mouth 2 (two) times daily as needed. (Patient taking differently: Take 25 mg by mouth at bedtime. )  . ibuprofen (ADVIL,MOTRIN) 200 MG tablet You can take 2-3 tablets every 6 hours as needed for pain not relieved by plain Tylenol(acetaminophen.)  This is your second medication you can take for pain relief.  You can start this a couple hours after you take the Tylenol if you need more.  You can buy this over the counter at any drug store without a prescription.  . Multiple Vitamin (MULTIVITAMIN WITH MINERALS) TABS tablet Take 1 tablet by mouth daily.  . Syringe/Needle, Disp, (SYRINGE 3CC/25GX1") 25G X 1" 3 ML MISC Use once monthly for injection of Viatmin B-12  . Vitamin D, Ergocalciferol, (DRISDOL) 1.25 MG (50000 UT) CAPS capsule Take 1 capsule (50,000 Units total) by mouth once a week.  . zolpidem (AMBIEN) 10 MG tablet Take 1 tablet (10 mg total) by mouth at bedtime as needed for sleep.    Allergies: Patient is allergic to topiramate. Family History: Patient family history includes Cancer in her father, paternal aunt, and paternal uncle; Diabetes in her maternal aunt and sister; Heart attack in her mother; Heart disease in her maternal aunt and mother; Stroke in her sister. Social History:  Patient  reports that she has never smoked. She has never used smokeless tobacco. She reports that she does not drink alcohol or use drugs.  Review of Systems: Constitutional: Negative for fever malaise or anorexia Cardiovascular: negative for chest pain Respiratory: negative for SOB  or persistent cough Gastrointestinal: negative for abdominal pain  OBJECTIVE Vitals: Wt 172 lb (78 kg)   LMP 01/13/2005   BMI 33.04 kg/m  General: no acute distress , A&Ox3  [name], MD

## 2018-07-29 NOTE — Patient Instructions (Addendum)
Please return in 3 months for recheck/anxiety and vit B12/D.  If you have any questions or concerns, please don't hesitate to send me a message via MyChart or call the office at [phone]. Thank you for visiting with [place] today! It's our pleasure caring for you.

## 2018-08-01 ENCOUNTER — Emergency Department (HOSPITAL_COMMUNITY)
Admission: EM | Admit: 2018-08-01 | Discharge: 2018-08-02 | Disposition: A | Discharge status: Home / Self Care | Payer: 59 | Attending: Emergency Medicine | Admitting: Emergency Medicine

## 2018-08-01 ENCOUNTER — Other Ambulatory Visit: Payer: Self-pay

## 2018-08-01 ENCOUNTER — Encounter (HOSPITAL_COMMUNITY): Payer: Self-pay | Admitting: Emergency Medicine

## 2018-08-01 DIAGNOSIS — Z79899 Other long term (current) drug therapy: Secondary | ICD-10-CM | POA: Diagnosis not present

## 2018-08-01 DIAGNOSIS — R109 Unspecified abdominal pain: Secondary | ICD-10-CM

## 2018-08-01 DIAGNOSIS — R1033 Periumbilical pain: Secondary | ICD-10-CM | POA: Insufficient documentation

## 2018-08-01 DIAGNOSIS — Z9884 Bariatric surgery status: Secondary | ICD-10-CM | POA: Insufficient documentation

## 2018-08-01 DIAGNOSIS — I959 Hypotension, unspecified: Secondary | ICD-10-CM | POA: Diagnosis not present

## 2018-08-01 DIAGNOSIS — R52 Pain, unspecified: Secondary | ICD-10-CM | POA: Diagnosis not present

## 2018-08-01 DIAGNOSIS — R110 Nausea: Secondary | ICD-10-CM | POA: Diagnosis not present

## 2018-08-01 DIAGNOSIS — R1084 Generalized abdominal pain: Secondary | ICD-10-CM | POA: Diagnosis not present

## 2018-08-01 DIAGNOSIS — K5730 Diverticulosis of large intestine without perforation or abscess without bleeding: Secondary | ICD-10-CM | POA: Diagnosis not present

## 2018-08-01 LAB — URINALYSIS, ROUTINE W REFLEX MICROSCOPIC
Bacteria, UA: NONE SEEN
Bilirubin Urine: NEGATIVE
Glucose, UA: NEGATIVE mg/dL
Hgb urine dipstick: NEGATIVE
Ketones, ur: 5 mg/dL — AB
Nitrite: NEGATIVE
Protein, ur: NEGATIVE mg/dL
Specific Gravity, Urine: 1.024 (ref 1.005–1.030)
pH: 5 (ref 5.0–8.0)

## 2018-08-01 LAB — COMPREHENSIVE METABOLIC PANEL
ALT: 17 U/L (ref 0–44)
AST: 24 U/L (ref 15–41)
Albumin: 3.9 g/dL (ref 3.5–5.0)
Alkaline Phosphatase: 83 U/L (ref 38–126)
Anion gap: 8 (ref 5–15)
BUN: 25 mg/dL — ABNORMAL HIGH (ref 8–23)
CO2: 23 mmol/L (ref 22–32)
Calcium: 8.2 mg/dL — ABNORMAL LOW (ref 8.9–10.3)
Chloride: 107 mmol/L (ref 98–111)
Creatinine, Ser: 0.92 mg/dL (ref 0.44–1.00)
GFR calc Af Amer: >60 mL/min (ref >60)
GFR calc non Af Amer: >60 mL/min (ref >60)
Glucose, Bld: 106 mg/dL — ABNORMAL HIGH (ref 70–99)
Potassium: 5.3 mmol/L — ABNORMAL HIGH (ref 3.5–5.1)
Sodium: 138 mmol/L (ref 135–145)
Total Bilirubin: 0.8 mg/dL (ref 0.3–1.2)
Total Protein: 7.4 g/dL (ref 6.5–8.1)

## 2018-08-01 LAB — CBC WITH DIFFERENTIAL/PLATELET
Abs Immature Granulocytes: 0.02 10*3/uL (ref 0.00–0.07)
Basophils Absolute: 0.1 10*3/uL (ref 0.0–0.1)
Basophils Relative: 1 %
Eosinophils Absolute: 0.1 10*3/uL (ref 0.0–0.5)
Eosinophils Relative: 2 %
HCT: 37.1 % (ref 36.0–46.0)
Hemoglobin: 12.1 g/dL (ref 12.0–15.0)
Immature Granulocytes: 0 %
Lymphocytes Relative: 20 %
Lymphs Abs: 1.2 10*3/uL (ref 0.7–4.0)
MCH: 26.9 pg (ref 26.0–34.0)
MCHC: 32.6 g/dL (ref 30.0–36.0)
MCV: 82.4 fL (ref 80.0–100.0)
Monocytes Absolute: 0.4 10*3/uL (ref 0.1–1.0)
Monocytes Relative: 6 %
Neutro Abs: 4.3 10*3/uL (ref 1.7–7.7)
Neutrophils Relative %: 71 %
Platelets: 239 10*3/uL (ref 150–400)
RBC: 4.5 MIL/uL (ref 3.87–5.11)
RDW: 13.4 % (ref 11.5–15.5)
WBC: 6.1 10*3/uL (ref 4.0–10.5)
nRBC: 0 % (ref 0.0–0.2)

## 2018-08-01 LAB — LIPASE, BLOOD: Lipase: 28 U/L (ref 11–51)

## 2018-08-01 MED ORDER — HYDROMORPHONE HCL 1 MG/ML IJ SOLN
1.0000 mg | Freq: Once | INTRAMUSCULAR | Status: AC
  Administered 2018-08-01: 1 mg via INTRAVENOUS
  Filled 2018-08-01: qty 1

## 2018-08-01 MED ORDER — ONDANSETRON HCL 4 MG/2ML IJ SOLN
4.0000 mg | Freq: Once | INTRAMUSCULAR | Status: AC
  Administered 2018-08-01: 23:00:00 4 mg via INTRAVENOUS
  Filled 2018-08-01: qty 2

## 2018-08-01 NOTE — ED Notes (Signed)
Pt is def in left ear. Works at [employer] and visit all clinics. Had gastric bypass 2015. Had a bowl obstruction 11/17/17 and this pain feels similar.

## 2018-08-01 NOTE — ED Notes (Signed)
Pt ambulated to RR with minor assistance to obtain urine sample.

## 2018-08-01 NOTE — ED Triage Notes (Signed)
[facility] transported from home to [facility] ED and reports the following:  Pt said abdominal pain started 7:30 PM 08/01/18. She had obstructive bowl surgery Nov 2019. She had gastric bypass at [facility]. Date? Explosive diarrhea started his morning. She ate chick at 7:00 PM today but prior to then she was too nauseous to eat. No vomiting.

## 2018-08-01 NOTE — ED Provider Notes (Signed)
[facility] DEPT Provider Note   CSN: [id number] Arrival date & time: 08/01/18  2031    History   Chief Complaint Chief Complaint  Patient presents with   Abdominal Pain    HPI Denise Rice is a 68 y.o. female with past medical history of gastric bypass s/p lap band with Dr Alvan Dame 2013, Laparoscopy for lysis of adhesions for small bowel obstruction 11/17/2017 with Dr. Excell Seltzer presents to emergency department today with chief complaint of abdominal pain x 1 day.  She states the pain is located around umbilicus.  She states the pain has been constant and rates it 8 out of 10 in severity. Pain does not radiate. She not take for pain prior to arrival.  She reports associated nausea without emesis, and diarrhea, describing an episode 2 hours prior to arrival of explosive diarrhea.  She states the stool was brown and without blood.  She admits to passing flatus. She denies fever, chills, shortness of breath, chest pain, urinary symptoms, suspicious food intake.  Chart review shows CTA for dissection with result of: Multiple dilated loops of small bowel clustered within the upper and mid abdomen with apparent transition point within the mid abdomen as above, concerning for small bowel obstruction. Underlying adhesive disease is suspected.  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   Small bowel obstruction ([place]) 11/17/2017   Chronic vertigo 03/11/2017   Major depression, chronic 03/11/2017   PCOS (polycystic ovarian syndrome) 03/11/2017   Insomnia 09/08/2015   Bilateral sensorineural hearing loss 09/06/2014   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 06/25/2012   History of laparoscopic adjustable gastric banding 01/22/2011   Vitamin D deficiency 01/15/2008    Past Surgical History:  Procedure Laterality Date   BLEPHAROPLASTY  03-05-2000   CRANIOTOMY  08/2014   to repair dehescence in right ear   HAMMER TOE SURGERY Left 08-29-2013   2nd toe   IMPLANTATION BONE ANCHORED HEARING AID Left 07/27/2008   left temporal bone;now removed   LAPAROSCOPIC CHOLECYSTECTOMY  03-31-2005   LAPAROSCOPIC GASTRIC BANDING  02-08-2007   LAPAROSCOPY N/A 11/17/2017   Procedure: LAPAROSCOPY LYSIS OF ADHESIONS FOR SMALL BOWEL OBSTRUCTION;  Surgeon: Excell Seltzer, MD;  Location: [facility];  Service: General;  Laterality: N/A;   LASER ABLATION CONDOLAMATA N/A 07/30/2012   Procedure: LASER ABLATION CONDOLAMATA;  Surgeon: Leighton Ruff, MD;  Location: [facility];  Service: General;  Laterality: N/A;   LASIK Bilateral 2002   LYSIS OF ADHESION N/A 11/17/2017   Procedure: LYSIS OF ADHESION;  Surgeon: Excell Seltzer, MD;  Location: [facility];  Service: General;  Laterality:  N/A;   ROUX-EN-Y GASTRIC BYPASS  AUG 2013   STAPEDES SURGERY Left 1985;  1994 x2;  02-03-1998   STRABISMUS SURGERY Right    VAGINAL HYSTERECTOMY  2007   fibroids     OB History    Gravida  0   Para      Term      Preterm      AB      Living        SAB      TAB      Ectopic      Multiple      Live Births               Home Medications    Prior to  Admission medications   Medication Sig Start Date End Date Taking? Authorizing Provider  acetaminophen (TYLENOL) 500 MG tablet Your can take 1000 mg every 8 hours as needed for pain.  This should be your primary pain medication.  You can alternate this with ibuprofen, or oxycodone as needed.  Do not exceed 4000 mg of Tylenol per day.  You can buy this over-the-counter at any drugstore. Patient taking differently: Take 500 mg by mouth every 8 (eight) hours as needed (pain). This should be your primary pain medication.  Do not exceed 4000 mg of Tylenol per day.  You can buy this over-the-counter at any drugstore. 11/19/17   Earnstine Regal, PA-C  Biotin 5000 MCG CAPS Take 5,000 mcg by mouth daily.     [provider]  busPIRone (BUSPAR) 7.5 MG tablet TAKE 1 TABLET BY MOUTH 3 TIMES DAILY AS NEEDED FOR ANXIETY 07/14/18   [name], MD  Calcium Carbonate-Vitamin D (CALCIUM 600 + D PO) Take 1 tablet by mouth daily.     [provider]  cephALEXin (KEFLEX) 500 MG capsule Take 1 capsule (500 mg total) by mouth 2 (two) times daily for 5 days. 08/02/18 08/07/18  Siddhi Dornbush E, PA-C  cyanocobalamin (,VITAMIN B-12,) 1000 MCG/ML injection Inject 1 mL (1,000 mcg total) into the muscle every 30 (thirty) days. 06/24/18   [name], MD  cyanocobalamin 2000 MCG tablet Take 2,000 mcg by mouth daily.    [provider]  diazepam (VALIUM) 10 MG tablet Take 1 tablet (10 mg total) by mouth 2 (two) times a day. 06/23/18   [name], MD  escitalopram (LEXAPRO) 20 MG tablet Take 1 tablet (20 mg total) by mouth daily. 08/27/17   [name], MD  ferrous sulfate 325 (65 FE) MG tablet Take 325 mg by mouth daily with breakfast.    [provider]  Fremanezumab-vfrm (AJOVY) 225 MG/1.5ML SOSY Inject 225 mg into the skin every 30 (thirty) days. 02/02/18   Melvenia Beam, MD  hydrOXYzine (ATARAX/VISTARIL) 25 MG tablet Take 1 tablet (25 mg total) by mouth 2 (two) times daily as  needed. Patient taking differently: Take 25 mg by mouth at bedtime.  03/08/18   [name], MD  ibuprofen (ADVIL,MOTRIN) 200 MG tablet You can take 2-3 tablets every 6 hours as needed for pain not relieved by plain Tylenol(acetaminophen.)  This is your second medication you can take for pain relief.  You can start this a couple hours after you take the Tylenol if you need more.  You can buy this over the counter at any drug store without a prescription. 11/19/17   Earnstine Regal, PA-C  Multiple Vitamin (MULTIVITAMIN WITH MINERALS) TABS tablet Take 1 tablet by mouth  daily.    [provider]  Syringe/Needle, Disp, (SYRINGE 3CC/25GX1") 25G X 1" 3 ML MISC Use once monthly for injection of Viatmin B-12 06/24/18   [name], MD  Vitamin D, Ergocalciferol, (DRISDOL) 1.25 MG (50000 UT) CAPS capsule Take 1 capsule (50,000 Units total) by mouth once a week. 06/24/18 09/22/18  [name], MD  zolpidem (AMBIEN) 10 MG tablet Take 1 tablet (10 mg total) by mouth at bedtime as needed for sleep. 06/23/18   [name], MD    Family History Family History  Problem Relation Age of Onset   Heart attack Mother    Heart disease Mother    Cancer Father        unknown primary   Stroke Sister    Diabetes Sister    Diabetes Maternal Aunt    Heart disease Maternal Aunt    Cancer Paternal Aunt    Cancer Paternal Uncle    Colon cancer Neg Hx    Colon polyps Neg Hx     Social History Social History   Tobacco Use   Smoking status: Never Smoker   Smokeless tobacco: Never Used  Substance Use Topics   Alcohol use: No    Alcohol/week: 0.0 standard drinks   Drug use: No     Allergies   Topiramate   Review of Systems Review of Systems  Constitutional: Negative for chills and fever.  HENT: Negative for congestion, ear discharge, ear pain, sinus pressure, sinus pain and sore throat.   Eyes: Negative for pain and redness.  Respiratory: Negative for cough and  shortness of breath.   Cardiovascular: Negative for chest pain.  Gastrointestinal: Positive for abdominal pain, diarrhea and nausea. Negative for constipation and vomiting.  Genitourinary: Negative for dysuria and hematuria.  Musculoskeletal: Negative for back pain and neck pain.  Skin: Negative for wound.  Neurological: Negative for weakness, numbness and headaches.     Physical Exam Updated Vital Signs BP 119/76    Pulse 62    Temp 97.7 F (36.5 C) (Oral)    Resp 20    Ht 5\' 1"  (1.549 m)    Wt 80.7 kg    LMP 01/13/2005    SpO2 94%    BMI 33.63 kg/m   Physical Exam Vitals signs and nursing note reviewed.  Constitutional:      General: She is not in acute distress.    Appearance: She is not ill-appearing.  HENT:     Head: Normocephalic and atraumatic.     Right Ear: Tympanic membrane and external ear normal.     Left Ear: Tympanic membrane and external ear normal.     Nose: Nose normal.     Mouth/Throat:     Mouth: Mucous membranes are moist.     Pharynx: Oropharynx is clear.  Eyes:     General: No scleral icterus.       Right eye: No discharge.        Left eye: No discharge.     Extraocular Movements: Extraocular movements intact.     Conjunctiva/sclera: Conjunctivae normal.     Pupils: Pupils are equal, round, and reactive to light.  Neck:     Musculoskeletal: Normal range of motion.     Vascular: No JVD.  Cardiovascular:     Rate and Rhythm: Normal rate and regular rhythm.     Pulses: Normal pulses.          Radial pulses are 2+ on the right side and 2+  on the left side.     Heart sounds: Normal heart sounds.  Pulmonary:     Comments: Lungs clear to auscultation in all fields. Symmetric chest rise. No wheezing, rales, or rhonchi. Abdominal:     General: Bowel sounds are normal.     Tenderness: There is abdominal tenderness. There is no right CVA tenderness or left CVA tenderness.     Comments: Abdomen is soft, non-distended, tenderness to periumbilical area with  guarding. No rigidity. No peritoneal signs.  Musculoskeletal: Normal range of motion.  Skin:    General: Skin is warm and dry.     Capillary Refill: Capillary refill takes less than 2 seconds.  Neurological:     Mental Status: She is oriented to person, place, and time.     GCS: GCS eye subscore is 4. GCS verbal subscore is 5. GCS motor subscore is 6.     Comments: Fluent speech, no facial droop.  Psychiatric:        Behavior: Behavior normal.      ED Treatments / Results  Labs (all labs ordered are listed, but only abnormal results are displayed) Labs Reviewed  COMPREHENSIVE METABOLIC PANEL - Abnormal; Notable for the following components:      Result Value   Potassium 5.3 (*)    Glucose, Bld 106 (*)    BUN 25 (*)    Calcium 8.2 (*)    All other components within normal limits  URINALYSIS, ROUTINE W REFLEX MICROSCOPIC - Abnormal; Notable for the following components:   Ketones, ur 5 (*)    Leukocytes,Ua TRACE (*)    All other components within normal limits  URINE CULTURE  CBC WITH DIFFERENTIAL/PLATELET  LIPASE, BLOOD    EKG None  Radiology Ct Abdomen Pelvis W Contrast  Result Date: 08/02/2018 CLINICAL DATA:  68 year old female with abdominal distention and pain. Concern for small bowel obstruction. EXAM: CT ABDOMEN AND PELVIS WITH CONTRAST TECHNIQUE: Multidetector CT imaging of the abdomen and pelvis was performed using the standard protocol following bolus administration of intravenous contrast. CONTRAST:  159mL OMNIPAQUE IOHEXOL 300 MG/ML  SOLN COMPARISON:  CT of the abdomen pelvis dated 03/19/2018 FINDINGS: Lower chest: The visualized lung bases are clear. No intra-abdominal free air or free fluid. Hepatobiliary: A 2.3 cm bilobed appearing cyst in the inferior right lobe of the liver. Additional subcentimeter hypodense foci are too small to characterize. The liver is otherwise unremarkable. There is mild intrahepatic biliary ductal dilatation. Cholecystectomy. No  retained calcified stone noted in the central CBD. Pancreas: Unremarkable. No pancreatic ductal dilatation or surrounding inflammatory changes. Spleen: Normal in size without focal abnormality. Adrenals/Urinary Tract: The adrenal glands are unremarkable. Bilateral renal cysts measure up to 4.5 cm in the inferior pole of the right kidney. There is no hydronephrosis on either side. There is symmetric enhancement and excretion of contrast by both kidneys. The visualized ureters and urinary bladder appear unremarkable. Stomach/Bowel: Postsurgical changes of gastric bypass. Moderate stool throughout the colon. There is no bowel obstruction or active inflammation. There are scattered colonic diverticula without active inflammatory changes. The appendix is normal. Vascular/Lymphatic: The abdominal aorta and IVC are unremarkable. No portal venous gas. There is no adenopathy. Reproductive: Hysterectomy. No pelvic mass. Other: Small fat containing umbilical hernia Musculoskeletal: No acute or significant osseous findings. IMPRESSION: 1. No acute intra-abdominal or pelvic pathology. No bowel obstruction or active inflammation. Normal appendix. 2. Colonic diverticulosis. Electronically Signed   By: Anner Crete M.D.   On: 08/02/2018 00:49  Procedures Procedures (including critical care time)  Medications Ordered in ED Medications  sodium chloride (PF) 0.9 % injection (has no administration in time range)  ondansetron (ZOFRAN) injection 4 mg (4 mg Intravenous Given 08/01/18 2254)  HYDROmorphone (DILAUDID) injection 1 mg (1 mg Intravenous Given 08/01/18 2255)  iohexol (OMNIPAQUE) 300 MG/ML solution 100 mL (100 mLs Intravenous Contrast Given 08/02/18 0017)     Initial Impression / Assessment and Plan / ED Course  I have reviewed the triage vital signs and the nursing notes.  Pertinent labs & imaging results that were available during my care of the patient were reviewed by me and considered in my medical  decision making (see chart for details).  68 year old female presents with periumbilical abdominal pain x1 day. She is overall well appearing, in no acute distress. On exam she has tenderness to palpation of periumbilical area with guarding.  No peritoneal signs. Bowel sounds are active. Will obtain labs, CT AP, dilaudid for pain, and zofran.  CT AP is without acute findings. On reassessment she is pain free and tolerating PO intake. Serial abdominal exams are benign. UA with trace leukocytes, will send culture and discharge with antibiotics for UTI.  Patient is hemodynamically stable, in NAD, and able to ambulate in the ED. Evaluation does not show pathology that would require ongoing emergent intervention or inpatient treatment. I explained the diagnosis to the patient.  Pain has been managed and has no complaints prior to discharge. Patient is comfortable with above plan and is stable for discharge at this time. All questions were answered prior to disposition.  Strict return precautions for returning to the ED were discussed. Encouraged follow up with PCP. The patient was discussed with and seen by Dr. Gilford Raid who agrees with the treatment plan.  This note was prepared using Dragon voice recognition software and may include unintentional dictation errors due to the inherent limitations of voice recognition software.   Final Clinical Impressions(s) / ED Diagnoses   Final diagnoses:  Abdominal pain, unspecified abdominal location    ED Discharge Orders         Ordered    cephALEXin (KEFLEX) 500 MG capsule  2 times daily     08/02/18 0143           Cherre Robins, PA-C 08/02/18 0147    Isla Pence, MD 08/03/18 1422

## 2018-08-01 NOTE — ED Notes (Signed)
Pt requested something to drink. Advised her that she was NPO until after CT but that a couple of ice chips were okay. Pt agreed. Provided small amount of ice chips.

## 2018-08-02 ENCOUNTER — Emergency Department (HOSPITAL_COMMUNITY): Payer: 59

## 2018-08-02 ENCOUNTER — Encounter (HOSPITAL_COMMUNITY): Payer: Self-pay

## 2018-08-02 ENCOUNTER — Other Ambulatory Visit (HOSPITAL_COMMUNITY): Payer: 59

## 2018-08-02 DIAGNOSIS — K5730 Diverticulosis of large intestine without perforation or abscess without bleeding: Secondary | ICD-10-CM | POA: Diagnosis not present

## 2018-08-02 DIAGNOSIS — R1033 Periumbilical pain: Secondary | ICD-10-CM | POA: Diagnosis not present

## 2018-08-02 DIAGNOSIS — Z9884 Bariatric surgery status: Secondary | ICD-10-CM | POA: Diagnosis not present

## 2018-08-02 DIAGNOSIS — Z79899 Other long term (current) drug therapy: Secondary | ICD-10-CM | POA: Diagnosis not present

## 2018-08-02 MED ORDER — SODIUM CHLORIDE (PF) 0.9 % IJ SOLN
INTRAMUSCULAR | Status: AC
  Filled 2018-08-02: qty 50

## 2018-08-02 MED ORDER — IOHEXOL 300 MG/ML  SOLN
100.0000 mL | Freq: Once | INTRAMUSCULAR | Status: AC | PRN
  Administered 2018-08-02: 100 mL via INTRAVENOUS

## 2018-08-02 MED ORDER — CEPHALEXIN 500 MG PO CAPS: 500.0000 mg | ORAL_CAPSULE | Freq: Two times a day (BID) | ORAL | 0 refills | Status: AC

## 2018-08-02 MED FILL — CEPHALEXIN 500 MG CAPSULE: 500 | 5 days supply | Qty: 10 | Fill #0

## 2018-08-02 NOTE — ED Notes (Signed)
Called lab to add on a urine culture per provider.

## 2018-08-02 NOTE — Telephone Encounter (Signed)
Continuation PA for Ajovy completed on CMM. Key: [id number]. Awaiting [employer] determination.

## 2018-08-02 NOTE — Discharge Instructions (Addendum)

## 2018-08-02 NOTE — ED Notes (Signed)
Pt drank 4oz and does not feel nauseous.

## 2018-08-03 ENCOUNTER — Other Ambulatory Visit: Payer: Self-pay

## 2018-08-03 ENCOUNTER — Ambulatory Visit (INDEPENDENT_AMBULATORY_CARE_PROVIDER_SITE_OTHER): Payer: 59 | Admitting: Family Medicine

## 2018-08-03 ENCOUNTER — Encounter (INDEPENDENT_AMBULATORY_CARE_PROVIDER_SITE_OTHER): Payer: Self-pay | Admitting: Family Medicine

## 2018-08-03 VITALS — BP 105/65 | HR 61 | Temp 97.7°F | Ht 61.0 in | Wt 178.0 lb

## 2018-08-03 DIAGNOSIS — R5383 Other fatigue: Secondary | ICD-10-CM

## 2018-08-03 DIAGNOSIS — E559 Vitamin D deficiency, unspecified: Secondary | ICD-10-CM | POA: Diagnosis not present

## 2018-08-03 DIAGNOSIS — E538 Deficiency of other specified B group vitamins: Secondary | ICD-10-CM | POA: Diagnosis not present

## 2018-08-03 DIAGNOSIS — R739 Hyperglycemia, unspecified: Secondary | ICD-10-CM | POA: Diagnosis not present

## 2018-08-03 DIAGNOSIS — Z9189 Other specified personal risk factors, not elsewhere classified: Secondary | ICD-10-CM | POA: Diagnosis not present

## 2018-08-03 DIAGNOSIS — Z6833 Body mass index (BMI) 33.0-33.9, adult: Secondary | ICD-10-CM

## 2018-08-03 DIAGNOSIS — Z0289 Encounter for other administrative examinations: Secondary | ICD-10-CM

## 2018-08-03 DIAGNOSIS — R0602 Shortness of breath: Secondary | ICD-10-CM

## 2018-08-03 DIAGNOSIS — E860 Dehydration: Secondary | ICD-10-CM | POA: Diagnosis not present

## 2018-08-03 DIAGNOSIS — E875 Hyperkalemia: Secondary | ICD-10-CM

## 2018-08-03 DIAGNOSIS — Z1331 Encounter for screening for depression: Secondary | ICD-10-CM | POA: Diagnosis not present

## 2018-08-03 DIAGNOSIS — E669 Obesity, unspecified: Secondary | ICD-10-CM

## 2018-08-03 NOTE — Progress Notes (Signed)
.  Office: [phone]  /  Fax: [phone]   HPI:   Chief Complaint: OBESITY  Denise Rice (MR# [id number]) is a 68 y.o. female who presents on 08/03/2018 for obesity evaluation and treatment. Current BMI is Body mass index is 33.63 kg/m. Denise Rice has struggled with obesity for years and has been unsuccessful in either losing weight or maintaining long term weight loss. Denise Rice attended our information session and states she is currently in the action stage of change and ready to dedicate time achieving and maintaining a healthier weight.   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 She has went from 250 lbs to 153 lbs, and has gained 25 lbs since COVID-19 pandemic.  Denise Rice states her desired weight loss is 28 lbs she has been heavy most of  her life she started gaining weight at puberty her heaviest weight ever was 250 lbs she has significant food cravings issues  she snacks frequently in the evenings she skips meals frequently she is trying to eat vegetarian she is frequently drinking liquids with calories she frequently makes poor food choices she has problems with excessive hunger  she frequently eats larger portions than normal  she struggles with emotional eating    Fatigue Denise Rice feels her energy is lower than it should be. This has worsened with weight gain and has not worsened recently. Denise Rice admits to daytime somnolence and  admits to waking up still tired. Patient is at risk for obstructive sleep apnea. Patent has a history of symptoms of daytime fatigue and morning headache. Patient generally gets 8 hours of sleep per night, and states they generally have generally restful sleep. Snoring is present. Apneic episodes are not present. Epworth Sleepiness Score is 13.  Dyspnea on exertion Denise Rice notes increasing shortness of breath with exercising and seems to be worsening over time with weight gain. She notes getting out of breath sooner with activity than she used to. This  has not gotten worse recently. Denise Rice denies orthopnea.  Vitamin D Deficiency Denise Rice has a diagnosis of vitamin D deficiency. She is currently taking OTC Vit D supplement. She has no recent Vit D labs. She notes fatigue and denies nausea, vomiting or muscle weakness.  Vitamin B12 Deficiency Denise Rice has a diagnosis of B12 insufficiency. She is on B12 injections and still notes fatigue. Most of her protein is from supplements. She is a vegetarian and does not have a previous diagnosis of pernicious anemia. She has a history of weight loss surgery.   Hyperkalemia Denise Rice's K+ was done 2 days ago after having diarrhea episodes. Level was elevated, and is likely due to hemolysis.  Dehydration Denise Rice's recent BUN was elevated after having diarrhea. She has a history of vertigo (none today).  Hyperglycemia Denise Rice has a history of polycystic ovarian syndrome and some elevated fasting blood glucose readings without a diagnosis of diabetes. She has no recent A1c.  At risk for diabetes Denise Rice is at higher than average risk for developing diabetes due to her obesity and hyperglycemia. She currently denies polyuria or polydipsia.  Depression Screen Denise Rice's Food and Mood (modified PHQ-9) score was  Depression screen PHQ 2/9 08/03/2018  Decreased Interest 2  Down, Depressed, Hopeless 1  PHQ - 2 Score 3  Altered sleeping 2  Tired, decreased energy 2  Change in appetite 2  Feeling bad or failure about yourself  2  Trouble concentrating 2  Moving slowly or fidgety/restless 0  Suicidal thoughts 0  PHQ-9 Score 13  Difficult doing  work/chores Somewhat difficult  Some recent data might be hidden    ASSESSMENT AND PLAN:  Other fatigue - Plan: EKG 12-Lead, T3, T4, free, TSH  Shortness of breath on exertion - Plan: Lipid Panel With LDL/HDL Ratio  Vitamin D deficiency - Plan: VITAMIN D 25 Hydroxy (Vit-D Deficiency, Fractures)  B12 nutritional deficiency - Plan: Folate, Vitamin B12  Hyperkalemia - Plan:  Comprehensive metabolic panel  Dehydration - Plan: Comprehensive metabolic panel  Hyperglycemia - Plan: Insulin, random, Hemoglobin A1c, Comprehensive metabolic panel  Depression screening  At risk for diabetes mellitus  Class 1 obesity with serious comorbidity and body mass index (BMI) of 33.0 to 33.9 in adult, unspecified obesity type  PLAN:  Fatigue Denise Rice was informed that her fatigue may be related to obesity, depression or many other causes. Labs will be ordered, and in the meanwhile Denise Rice has agreed to work on diet, exercise and weight loss to help with fatigue. Proper sleep hygiene was discussed including the need for 7-8 hours of quality sleep each night. A sleep study was not ordered based on symptoms and Epworth score.  Dyspnea on exertion Denise Rice's shortness of breath appears to be obesity related and exercise induced. She has agreed to work on weight loss and gradually increase exercise to treat her exercise induced shortness of breath. If Denise Rice follows our instructions and loses weight without improvement of her shortness of breath, we will plan to refer to pulmonology. We will monitor this condition regularly. Denise Rice agrees to this plan.  Vitamin D Deficiency Denise Rice was informed that low vitamin D levels contributes to fatigue and are associated with obesity, breast, and colon cancer. She will follow up for routine testing of vitamin D, at least 2-3 times per year. She was informed of the risk of over-replacement of vitamin D and agrees to not increase her dose unless she discusses this with [place] first. We will check labs today. Denise Rice agrees to follow up with our clinic in 2 weeks.  Vitamin B12 Deficiency Denise Rice will work on increasing B12 rich foods in her diet. B12 supplementation was not prescribed today. We will check labs today. Denise Rice agrees to follow up with our clinic in 2 weeks.  Hyperkalemia We will check labs today. Denise Rice agrees to follow up with our clinic in 2 weeks.   Dehydration We will check labs today. Denise Rice is to increase her H20 intake, and she will follow up with our clinic in 2 weeks.  Hyperglycemia Fasting labs will be obtained today and results with be discussed with Denise Rice in 2 weeks at her follow up visit. In the meanwhile Denise Rice was started on a lower simple carbohydrate diet and will work on weight loss efforts.  Diabetes risk counseling Denise Rice was given extended (30 minutes) diabetes prevention counseling today. She is 68 y.o. female and has risk factors for diabetes including obesity and hyperglycemia. We discussed intensive lifestyle modifications today with an emphasis on weight loss as well as increasing exercise and decreasing simple carbohydrates in her diet.  Depression Screen Denise Rice had a moderately positive depression screening. Depression is commonly associated with obesity and often results in emotional eating behaviors. We will monitor this closely and work on CBT to help improve the non-hunger eating patterns. Referral to Psychology may be required if no improvement is seen as she continues in our clinic.  Obesity Denise Rice is currently in the action stage of change and her goal is to continue with weight loss efforts She has agreed to follow the Category 1  plan Denise Rice has been instructed to work up to a goal of 150 minutes of combined cardio and strengthening exercise per week for weight loss and overall health benefits. We discussed the following Behavioral Modification Strategies today: increasing lean protein intake, work on meal planning and easy cooking plans, and keeping healthy foods in the home  Denise Rice has agreed to follow up with our clinic in 2 weeks. She was informed of the importance of frequent follow up visits to maximize her success with intensive lifestyle modifications for her multiple health conditions. She was informed we would discuss her lab results at her next visit unless there is a critical issue that needs to be  addressed sooner. Denise Rice agreed to keep her next visit at the agreed upon time to discuss these results.  ALLERGIES: Allergies  Allergen Reactions  . Topiramate Nausea Only    MEDICATIONS: Current Outpatient Medications on File Prior to Visit  Medication Sig Dispense Refill  . Biotin 5000 MCG CAPS Take 5,000 mcg by mouth daily.     . busPIRone (BUSPAR) 7.5 MG tablet TAKE 1 TABLET BY MOUTH 3 TIMES DAILY AS NEEDED FOR ANXIETY 90 tablet 1  . cephALEXin (KEFLEX) 500 MG capsule Take 1 capsule (500 mg total) by mouth 2 (two) times daily for 5 days. 10 capsule 0  . cyanocobalamin (,VITAMIN B-12,) 1000 MCG/ML injection Inject 1 mL (1,000 mcg total) into the muscle every 30 (thirty) days. 1 mL 12  . cyanocobalamin 2000 MCG tablet Take 2,000 mcg by mouth daily.    Marland Kitchen escitalopram (LEXAPRO) 20 MG tablet Take 1 tablet (20 mg total) by mouth daily. 90 tablet 3  . ferrous sulfate 325 (65 FE) MG tablet Take 325 mg by mouth daily with breakfast.    . Fremanezumab-vfrm (AJOVY) 225 MG/1.5ML SOSY Inject 225 mg into the skin every 30 (thirty) days. 1 Syringe 11  . Multiple Vitamin (MULTIVITAMIN WITH MINERALS) TABS tablet Take 1 tablet by mouth daily.    . Syringe/Needle, Disp, (SYRINGE 3CC/25GX1") 25G X 1" 3 ML MISC Use once monthly for injection of Viatmin B-12 50 each 0  . zolpidem (AMBIEN) 10 MG tablet Take 1 tablet (10 mg total) by mouth at bedtime as needed for sleep. 30 tablet 5   No current facility-administered medications on file prior to visit.     PAST MEDICAL HISTORY: Past Medical History:  Diagnosis Date  . Anal condyloma   . Anxiety   . Constipation   . Deafness in left ear   . Depression   . Fatigue   . Gallbladder problem   . Hearing loss, sensorineural, high frequency    RIGHT EAR  . 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 Last eruption 3/12  . History of peptic ulcer   . Infertility, female   . Lactose intolerance   . Otosclerosis  of left ear   . Pancreatic disease   . PCO (polycystic ovaries)   . PCOS (polycystic ovarian syndrome) 03/11/2017  . PONV (postoperative nausea and vomiting)   . 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 Vertigo   . Vitamin B12 deficiency 07/29/2018   Started IM injections monthly 06/2018  . Vitamin D deficiency   . Wears hearing aid    both ears    PAST SURGICAL HISTORY: Past Surgical History:  Procedure Laterality Date  . BLEPHAROPLASTY  03-05-2000  . CRANIOTOMY  08/2014   to repair dehescence in right ear  .  HAMMER TOE SURGERY Left 08-29-2013   2nd toe  . IMPLANTATION BONE ANCHORED HEARING AID Left 07/27/2008   left temporal bone;now removed  . LAPAROSCOPIC CHOLECYSTECTOMY  03-31-2005  . LAPAROSCOPIC GASTRIC BANDING  02-08-2007  . LAPAROSCOPY N/A 11/17/2017   Procedure: LAPAROSCOPY LYSIS OF ADHESIONS FOR SMALL BOWEL OBSTRUCTION;  Surgeon: Excell Seltzer, MD;  Location: [facility];  Service: General;  Laterality: N/A;  . LASER ABLATION CONDOLAMATA N/A 07/30/2012   Procedure: LASER ABLATION CONDOLAMATA;  Surgeon: Leighton Ruff, MD;  Location: [facility];  Service: General;  Laterality: N/A;  . LASIK Bilateral 2002  . LYSIS OF ADHESION N/A 11/17/2017   Procedure: LYSIS OF ADHESION;  Surgeon: Excell Seltzer, MD;  Location: [facility];  Service: General;  Laterality: N/A;  . ROUX-EN-Y GASTRIC BYPASS  AUG 2013  . [place];  1994 x2;  02-03-1998  . STRABISMUS SURGERY Right   . VAGINAL HYSTERECTOMY  2007   fibroids    SOCIAL HISTORY: Social History   Tobacco Use  . Smoking status: Never Smoker  . Smokeless tobacco: Never Used  Substance Use Topics  . Alcohol use: No    Alcohol/week: 0.0 standard drinks  . Drug use: No    FAMILY HISTORY: Family History  Problem Relation Age of Onset  . Heart attack Mother   . Heart disease Mother   . Diabetes Mother   . Hypertension Mother   . Hyperlipidemia Mother   . Stroke Mother   .  Kidney disease Mother   . Thyroid disease Mother   . Anxiety disorder Mother   . Obesity Mother   . Cancer Father        unknown primary  . Diabetes Father   . Hypertension Father   . Hyperlipidemia Father   . Heart disease Father   . Stroke Father   . Kidney disease Father   . Obesity Father   . Stroke Sister   . Diabetes Sister   . Diabetes Maternal Aunt   . Heart disease Maternal Aunt   . Cancer Paternal Aunt   . Cancer Paternal Uncle   . Colon cancer Neg Hx   . Colon polyps Neg Hx     ROS: Review of Systems  Constitutional: Positive for malaise/fatigue. Negative for weight loss.  HENT: Positive for tinnitus.        + Dry mouth + Decreased hearing  Respiratory: Positive for shortness of breath.   Cardiovascular: Negative for orthopnea.       + Leg cramping + Very cold feet or hands  Gastrointestinal: Positive for diarrhea. Negative for nausea and vomiting.  Musculoskeletal:       Negative muscle weakness + Muscle or joint pain + Muscle stiffness  Neurological: Positive for dizziness and headaches.  Psychiatric/Behavioral: Positive for depression. Negative for suicidal ideas. The patient is nervous/anxious.        + Stress    PHYSICAL EXAM: Blood pressure 105/65, pulse 61, temperature 97.7 F (36.5 C), temperature source Oral, height 5\' 1"  (1.549 m), weight 178 lb (80.7 kg), last menstrual period 01/13/2005, SpO2 94 %. Body mass index is 33.63 kg/m. Physical Exam Vitals signs reviewed.  Constitutional:      Appearance: Normal appearance. She is obese.  HENT:     Head: Normocephalic and atraumatic.     Nose: Nose normal.  Eyes:     General: No scleral icterus.    Extraocular Movements: Extraocular movements intact.  Neck:     Musculoskeletal: Normal range  of motion and neck supple.     Comments: No thyromegaly present Cardiovascular:     Rate and Rhythm: Normal rate and regular rhythm.     Pulses: Normal pulses.     Heart sounds: Normal heart sounds.   Pulmonary:     Effort: Pulmonary effort is normal. No respiratory distress.     Breath sounds: Normal breath sounds.  Abdominal:     Palpations: Abdomen is soft.     Tenderness: There is no abdominal tenderness.     Comments: + Obesity  Musculoskeletal: Normal range of motion.     Right lower leg: No edema.     Left lower leg: No edema.  Skin:    General: Skin is warm and dry.  Neurological:     Mental Status: She is alert and oriented to person, place, and time.     Coordination: Coordination normal.  Psychiatric:        Mood and Affect: Mood normal.        Behavior: Behavior normal.     RECENT LABS AND TESTS: BMET    Component Value Date/Time   NA 138 08/01/2018 2201   NA 139 02/26/2018 1032   K 5.3 (H) 08/01/2018 2201   CL 107 08/01/2018 2201   CO2 23 08/01/2018 2201   GLUCOSE 106 (H) 08/01/2018 2201   BUN 25 (H) 08/01/2018 2201   BUN 17 02/26/2018 1032   CREATININE 0.92 08/01/2018 2201   CREATININE 0.64 09/07/2015 1014   CALCIUM 8.2 (L) 08/01/2018 2201   GFRNONAA >60 08/01/2018 2201   GFRAA >60 08/01/2018 2201   Lab Results  Component Value Date   HGBA1C 5.3 02/26/2018   No results found for: INSULIN CBC    Component Value Date/Time   WBC 6.1 08/01/2018 2201   RBC 4.50 08/01/2018 2201   HGB 12.1 08/01/2018 2201   HGB 12.5 02/26/2018 1032   HCT 37.1 08/01/2018 2201   HCT 36.8 02/26/2018 1032   PLT 239 08/01/2018 2201   PLT 256 02/26/2018 1032   MCV 82.4 08/01/2018 2201   MCV 81 02/26/2018 1032   MCH 26.9 08/01/2018 2201   MCHC 32.6 08/01/2018 2201   RDW 13.4 08/01/2018 2201   RDW 14.3 02/26/2018 1032   LYMPHSABS 1.2 08/01/2018 2201   LYMPHSABS 1.6 02/26/2018 1032   MONOABS 0.4 08/01/2018 2201   EOSABS 0.1 08/01/2018 2201   EOSABS 0.1 02/26/2018 1032   BASOSABS 0.1 08/01/2018 2201   BASOSABS 0.0 02/26/2018 1032   Iron/TIBC/Ferritin/ %Sat    Component Value Date/Time   FERRITIN 13 06/16/2012 1053   Lipid Panel     Component Value Date/Time    CHOL 164 06/23/2018 1151   TRIG 101.0 06/23/2018 1151   HDL 75.30 06/23/2018 1151   CHOLHDL 2 06/23/2018 1151   VLDL 20.2 06/23/2018 1151   LDLCALC 69 06/23/2018 1151   Hepatic Function Panel     Component Value Date/Time   PROT 7.4 08/01/2018 2201   PROT 6.7 07/07/2016 1249   ALBUMIN 3.9 08/01/2018 2201   ALBUMIN 4.0 07/07/2016 1249   AST 24 08/01/2018 2201   ALT 17 08/01/2018 2201   ALKPHOS 83 08/01/2018 2201   BILITOT 0.8 08/01/2018 2201   BILITOT 0.5 07/07/2016 1249   BILIDIR 0.2 09/07/2015 1014   IBILI 0.3 09/07/2015 1014      Component Value Date/Time   TSH 1.47 06/23/2018 1151   Vitamin D No recent labs  ECG  shows NSR with a rate of 60  BPM INDIRECT CALORIMETER done today shows a VO2 of 203 and a REE of 1417. Her calculated basal metabolic rate is 6270 thus her basal metabolic rate is better than expected.       OBESITY BEHAVIORAL INTERVENTION VISIT  Today's visit was # 1   Starting weight: 178 lbs Starting date: 08/03/2018 Today's weight : 178 lbs Today's date: 08/03/2018 Total lbs lost to date: 0    ASK: We discussed the diagnosis of obesity with Denise Rice today and Graciela agreed to give [place] permission to discuss obesity behavioral modification therapy today.  ASSESS: Nayellie has the diagnosis of obesity and her BMI today is 33.65 Denise Rice is in the action stage of change   ADVISE: Denise Rice was educated on the multiple health risks of obesity as well as the benefit of weight loss to improve her health. She was advised of the need for long term treatment and the importance of lifestyle modifications to improve her current health and to decrease her risk of future health problems.  AGREE: Multiple dietary modification options and treatment options were discussed and  Denise Rice agreed to follow the recommendations documented in the above note.  ARRANGE: Denise Rice was educated on the importance of frequent visits to treat obesity as outlined per CMS and USPSTF  guidelines and agreed to schedule her next follow up appointment today.   I, Trixie Dredge, am acting as transcriptionist for Dennard Nip, MD I have reviewed the above documentation for accuracy and completeness, and I agree with the above. -Dennard Nip, MD

## 2018-08-03 NOTE — Telephone Encounter (Signed)
Ajovy approval received from [place]. 225 mg per month approved 08/02/2018 through 08/01/2019. Faxed a copy of approval to [facility] outpatient pharmacy. Received a receipt of confirmation.

## 2018-08-04 LAB — TSH: TSH: 4.66 u[IU]/mL — ABNORMAL HIGH (ref 0.450–4.500)

## 2018-08-04 LAB — COMPREHENSIVE METABOLIC PANEL
ALT: 14 IU/L (ref 0–32)
AST: 24 IU/L (ref 0–40)
Albumin/Globulin Ratio: 1.4 (ref 1.2–2.2)
Albumin: 4 g/dL (ref 3.8–4.8)
Alkaline Phosphatase: 89 IU/L (ref 39–117)
BUN/Creatinine Ratio: 16 (ref 12–28)
BUN: 15 mg/dL (ref 8–27)
Bilirubin Total: 0.3 mg/dL (ref 0.0–1.2)
CO2: 25 mmol/L (ref 20–29)
Calcium: 8.8 mg/dL (ref 8.7–10.3)
Chloride: 101 mmol/L (ref 96–106)
Creatinine, Ser: 0.91 mg/dL (ref 0.57–1.00)
GFR calc Af Amer: 77 mL/min/{1.73_m2} (ref >59)
GFR calc non Af Amer: 66 mL/min/{1.73_m2} (ref >59)
Globulin, Total: 2.9 g/dL (ref 1.5–4.5)
Glucose: 66 mg/dL (ref 65–99)
Potassium: 4.9 mmol/L (ref 3.5–5.2)
Sodium: 140 mmol/L (ref 134–144)
Total Protein: 6.9 g/dL (ref 6.0–8.5)

## 2018-08-04 LAB — LIPID PANEL WITH LDL/HDL RATIO
Cholesterol, Total: 163 mg/dL (ref 100–199)
HDL: 75 mg/dL (ref >39)
LDL Calculated: 67 mg/dL (ref 0–99)
LDl/HDL Ratio: 0.9 ratio (ref 0.0–3.2)
Triglycerides: 107 mg/dL (ref 0–149)
VLDL Cholesterol Cal: 21 mg/dL (ref 5–40)

## 2018-08-04 LAB — T3: T3, Total: 80 ng/dL (ref 71–180)

## 2018-08-04 LAB — VITAMIN B12: Vitamin B-12: 659 pg/mL (ref 232–1245)

## 2018-08-04 LAB — T4, FREE: Free T4: 1.4 ng/dL (ref 0.82–1.77)

## 2018-08-04 LAB — HEMOGLOBIN A1C
Est. average glucose Bld gHb Est-mCnc: 114 mg/dL
Hgb A1c MFr Bld: 5.6 % (ref 4.8–5.6)

## 2018-08-04 LAB — FOLATE: Folate: 10 ng/mL (ref >3.0)

## 2018-08-04 LAB — VITAMIN D 25 HYDROXY (VIT D DEFICIENCY, FRACTURES): Vit D, 25-Hydroxy: 28.2 ng/mL — ABNORMAL LOW (ref 30.0–100.0)

## 2018-08-04 LAB — INSULIN, RANDOM: INSULIN: 3.7 u[IU]/mL (ref 2.6–24.9)

## 2018-08-05 ENCOUNTER — Other Ambulatory Visit: Payer: Self-pay

## 2018-08-05 ENCOUNTER — Ambulatory Visit (INDEPENDENT_AMBULATORY_CARE_PROVIDER_SITE_OTHER): Payer: 59 | Admitting: Psychology

## 2018-08-05 DIAGNOSIS — F418 Other specified anxiety disorders: Secondary | ICD-10-CM | POA: Diagnosis not present

## 2018-08-05 LAB — URINE CULTURE: Culture: 20000 — AB

## 2018-08-05 NOTE — Progress Notes (Signed)
Office: [phone]  /  Fax: [phone]    Date: August 05, 2018    Appointment Start Time: 12:02pm Duration: 45 minutes Provider: Glennie Isle, Psy.D. Type of Session: Intake for Individual Therapy  Location of Patient: Work Location of Provider: Provider's Home Type of Contact: Telepsychological Visit via [employer]  Informed Consent: Prior to proceeding with today's appointment, two pieces of identifying information were obtained from Denise Rice to verify identity. In addition, [name]'s physical location at the time of this appointment was obtained. [name] reported she was at work and provided the address. In the event of technical difficulties, [name] shared a phone number she could be reached at. [name] and this provider participated in today's telepsychological service. Also, [name] denied anyone else being present in the room or on the WebEx appointment.   The provider's role was explained to [facility]. The provider reviewed and discussed issues of confidentiality, privacy, and limits therein (e.g., reporting obligations). In addition to verbal informed consent, written informed consent for psychological services was obtained from [name] prior to the initial intake interview. Written consent included information concerning the practice, financial arrangements, and confidentiality and patients' rights. Since the clinic is not a 24/7 crisis center, mental health emergency resources were shared, and the provider explained MyChart, e-mail, voicemail, and/or other messaging systems should be utilized only for non-emergency reasons. This provider also explained that information obtained during appointments will be placed in [name]'s medical record in a confidential manner and relevant information will be shared with other providers at [facility] that she meets with for coordination of care. [name] verbally acknowledged understanding of the aforementioned, and agreed to use mental health  emergency resources discussed if needed. Moreover, [name] agreed information may be shared with other [facility] providers as needed for coordination of care. By signing the service agreement document, [name] provided written consent for coordination of care.   Prior to initiating telepsychological services, [name] was provided with an informed consent document, which included the development of a safety plan (i.e., an emergency contact and emergency resources) in the event of an emergency/crisis. [name] expressed understanding of the rationale of the safety plan and provided consent for this provider to reach out to her emergency contact in the event of an emergency/crisis. [name] returned the completed consent form prior to today's appointment. This provider verbally reviewed the consent form during today's appointment prior to proceeding with the appointment. [name] verbally acknowledged understanding that she is ultimately responsible for understanding her insurance benefits as it relates to reimbursement of telepsychological and in-person services. This provider also reviewed confidentiality, as it relates to telepsychological services, as well as the rationale for telepsychological services. More specifically, this provider's clinic is limiting in-person visits due to COVID-19. Therapeutic services will resume to in-person appointments once deemed appropriate. [name] expressed understanding regarding the rationale for telepsychological services. In addition, this provider explained the telepsychological services informed consent document would be considered an addendum to the initial consent document/service agreement. [name] verbally consented to proceed.   Chief Complaint/HPI: [name] was referred by Dr. Dennard Nip. During the initial appointment with Dr. Dennard Nip at [facility] on August 03, 2018, Etrulia reported experiencing the following: significant food cravings issues ,  snacking frequently in the evenings, frequently drinking liquids with calories, frequently making poor food choices, frequently eating larger portions than normal , struggling with emotional eating, skipping meals frequently and having problems with excessive hunger. She has a history of gastric bypass  surgery.  During today's appointment, [name] was verbally administered a questionnaire assessing various behaviors related to emotional eating. [name] endorsed the following: overeat when you are celebrating, eat certain foods when you are anxious, stressed, depressed, or your feelings are hurt, use food to help you cope with emotional situations, find food is comforting to you, overeat when you are worried about something and overeat frequently when you are bored or lonely. [name] stated the onset of emotional eating was likely at the onset of the pandemic when she was asked to work remotely. Currently, she described the frequency of emotional eating as "3-4 times a week." She shared she craves carbohydrates. In addition, she denied a history of binge eating. [name] denied a history of restricting food intake, purging and engagement in other compensatory strategies, and has never been diagnosed with an eating disorder. She also denied a history of treatment for emotional eating. Moreover, [name] indicated the pandemic and subsequent changes triggers emotional eating, whereas exercise makes emotional eating better. She discussed meeting with a personal trainer prior to the pandemic. Furthermore, [name] shared, "My husband is very sick," which is contributing to her overall stress.  Mental Status Examination:  Appearance: neat Behavior: cooperative Mood: euthymic Affect: mood congruent Speech: normal in rate, volume, and tone Eye Contact: appropriate Psychomotor Activity: appropriate Thought Process: linear, logical, and goal directed  Content/Perceptual Disturbances: denies suicidal and homicidal ideation,  plan, and intent and no hallucinations, delusions, bizarre thinking or behavior reported or observed Orientation: time, person, place and purpose of appointment Cognition/Sensorium: memory, attention, language, and fund of knowledge intact  Insight: good Judgment: good  Family & Psychosocial History: Delisha reported she is married and she has one adoptive child (age 72). 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 Additionally, Fujie shared her highest level of education obtained is a master's degree. Currently, Kynzie's social support system consists of her husband, daughter, very good friend, and a small circle of friends. Moreover, Elvis stated she resides with her husband and daughter.   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    Vertigo    Vitamin B12 deficiency 07/29/2018   Started IM injections monthly 06/2018   Vitamin D deficiency    Wears hearing aid    both ears   Past Surgical History:  Procedure Laterality Date   BLEPHAROPLASTY  03-05-2000   CRANIOTOMY  08/2014   to repair dehescence in right ear   HAMMER TOE SURGERY Left 08-29-2013   2nd toe   IMPLANTATION BONE ANCHORED HEARING AID Left 07/27/2008   left temporal bone;now removed   LAPAROSCOPIC CHOLECYSTECTOMY  03-31-2005   LAPAROSCOPIC  GASTRIC BANDING  02-08-2007   LAPAROSCOPY N/A 11/17/2017   Procedure: LAPAROSCOPY LYSIS  OF ADHESIONS FOR SMALL BOWEL OBSTRUCTION;  Surgeon: Excell Seltzer, MD;  Location: [facility];  Service: General;  Laterality: N/A;   LASER ABLATION CONDOLAMATA N/A 07/30/2012   Procedure: LASER ABLATION CONDOLAMATA;  Surgeon: Leighton Ruff, MD;  Location: [facility];  Service: General;  Laterality: N/A;   LASIK Bilateral 2002   LYSIS OF ADHESION N/A 11/17/2017   Procedure: LYSIS OF ADHESION;  Surgeon: Excell Seltzer, MD;  Location: [place];  Service: General;  Laterality: N/A;   ROUX-EN-Y GASTRIC BYPASS  AUG 2013   [place];  1994 x2;  02-03-1998   STRABISMUS SURGERY Right    VAGINAL HYSTERECTOMY  2007   fibroids   Current Outpatient Medications on File Prior to Visit  Medication Sig Dispense Refill   Biotin 5000 MCG CAPS Take 5,000 mcg by mouth daily.      busPIRone (BUSPAR) 7.5 MG tablet TAKE 1 TABLET BY MOUTH 3 TIMES DAILY AS NEEDED FOR ANXIETY 90 tablet 1   cephALEXin (KEFLEX) 500 MG capsule Take 1 capsule (500 mg total) by mouth 2 (two) times daily for 5 days. 10 capsule 0   cyanocobalamin (,VITAMIN B-12,) 1000 MCG/ML injection Inject 1 mL (1,000 mcg total) into the muscle every 30 (thirty) days. 1 mL 12   cyanocobalamin 2000 MCG tablet Take 2,000 mcg by mouth daily.     escitalopram (LEXAPRO) 20 MG tablet Take 1 tablet (20 mg total) by mouth daily. 90 tablet 3   ferrous sulfate 325 (65 FE) MG tablet Take 325 mg by mouth daily with breakfast.     Fremanezumab-vfrm (AJOVY) 225 MG/1.5ML SOSY Inject 225 mg into the skin every 30 (thirty) days. 1 Syringe 11   Multiple Vitamin (MULTIVITAMIN WITH MINERALS) TABS tablet Take 1 tablet by mouth daily.     Syringe/Needle, Disp, (SYRINGE 3CC/25GX1") 25G X 1" 3 ML MISC Use once monthly for injection of Viatmin B-12 50 each 0   zolpidem (AMBIEN) 10 MG tablet Take 1 tablet (10 mg total) by mouth at bedtime as  needed for sleep. 30 tablet 5   No current facility-administered medications on file prior to visit.   Due to vertigo, Ilma shared she has suffered several falls. She noted one fall resulted in a concussion approximately 3-4 years ago resulting in [place] for an unknown period of time. Recently, she had a craniotomy secondary to a fall. Sari shared she has always received medical attention. Currently, she ambulates with a cane.  Mental Health History: Satara first attended therapeutic services with her husband 20 years ago secondary to losing a baby they were referred to adopt. While following the [employer] diet, she attended therapeutic services approximately 8 years ago as part of the program requirement. Subsequently, [name] completed the necessary evaluation to be cleared for bariatric surgery. Juliannah denied a history of hospitalizations for psychiatric concerns. She is unsure if she has ever met with a psychiatrist. Etienne stated she is currently prescribed Valium, Buspar, Lexapro, and Ambien by her PCP. She described her current medication regimen as "helpful." She was previously prescribed Xanax and noted it was "terrible" for her. Regarding family history, she discussed her father was hospitalized in [place] at a [place], but she was unsure of his diagnoses. Amadea denied a trauma history, including psychological, physical  and sexual abuse, as well as neglect.   Jalei described her typical mood as "pretty good." Aside from concerns noted above and endorsed on the PHQ-9 and GAD-7, Lupe reported experiencing decreased motivation to exericise  and worry thoughts about her job and the well-being of her daughter and husband. Anyi denied current alcohol use. She denied tobacco use. She denied illicit/recreational substance use. Regarding caffeine intake, Kalissa reported consuming 4-5 cups of coffee daily. Furthermore, [name] denied experiencing the following: hopelessness, hallucinations and delusions, paranoia and  mania. She also denied history of and current suicidal ideation, plan, and intent; history of and current homicidal ideation, plan, and intent; and history of and current engagement in self-harm.  The following strengths were reported by [name]: can form relationships with others easily, friendly, and proud of career. The following strengths were observed by this provider: ability to express thoughts and feelings during the therapeutic session, ability to establish and benefit from a therapeutic relationship, ability to learn and practice coping skills, willingness to work toward established goal(s) with the clinic and ability to engage in reciprocal conversation.  Legal History: Shacara denied a history of legal involvement.   Structured Assessment Results: The Patient Health Questionnaire-9 (PHQ-9) is a self-report measure that assesses symptoms and severity of depression over the course of the last two weeks. Noelly obtained a score of 7 suggesting mild depression. Prisilla finds the endorsed symptoms to be somewhat difficult. Little interest or pleasure in doing things 0  Feeling down, depressed, or hopeless 2  Trouble falling or staying asleep, or sleeping too much 0  Feeling tired or having little energy 2  Poor appetite or overeating 3  Feeling bad about yourself --- or that you are a failure or have let yourself or your family down 0  Trouble concentrating on things, such as reading the newspaper or watching television 0  Moving or speaking so slowly that other people could have noticed? Or the opposite --- being so fidgety or restless that you have been moving around a lot more than usual 0  Thoughts that you would be better off dead or hurting yourself in some way 0  PHQ-9 Score 7    The Generalized Anxiety Disorder-7 (GAD-7) is a brief self-report measure that assesses symptoms of anxiety over the course of the last two weeks. Jeslie obtained a score of 6 suggesting mild anxiety. Beau finds the  endorsed symptoms to be somewhat difficult. Feeling nervous, anxious, on edge 2  Not being able to stop or control worrying 2  Worrying too much about different things 1  Trouble relaxing 0  Being so restless that it's hard to sit still 0  Becoming easily annoyed or irritable 0  Feeling afraid as if something awful might happen 1  GAD-7 Score 6   Interventions: A chart review was conducted prior to the clinical intake interview. The PHQ-9, and GAD-7 were verbally administered as well as a Mood and Food questionnaire to assess various behaviors related to emotional eating. Throughout session, empathic reflections and validation was provided. Continuing treatment with this provider was discussed and a treatment goal was established. Psychoeducation regarding emotional versus physical hunger was provided. Lashaun was sent a handout via e-mail to utilize between now and the next appointment to increase awareness of hunger patterns and subsequent eating. [name] provided verbal consent during today's appointment for this provider to send the handout via e-mail.   Provisional DSM-5 Diagnosis: 300.09 (F41.8) Other Specified Anxiety Disorder, Emotional Eating Behaviors  Plan: Mele appears able and willing to participate as evidenced by collaboration on a treatment goal, engagement in reciprocal conversation, and asking questions as needed for clarification. The next appointment will be scheduled in two weeks, which will  be via [employer]. The following treatment goal was established: decrease emotional eating. Once this provider's office resumes in-person appointments and it is deemed appropriate, Loney will be notified. For the aforementioned goal, Kora can benefit from biweekly individual therapy sessions that are brief in duration for approximately four to six sessions. The treatment modality will be individual therapeutic services, including an eclectic therapeutic approach utilizing techniques from  Cognitive Behavioral Therapy, Patient Centered Therapy, Dialectical Behavior Therapy, Acceptance and Commitment Therapy, Interpersonal Therapy, and Cognitive Restructuring. Therapeutic approach will include various interventions as appropriate, such as validation, support, mindfulness, thought defusion, reframing, psychoeducation, values assessment, and role playing. This provider will regularly review the treatment plan and medical chart to keep informed of status changes. Shawniece expressed understanding and agreement with the initial treatment plan of care.

## 2018-08-06 ENCOUNTER — Telehealth: Payer: Self-pay | Admitting: *Deleted

## 2018-08-06 ENCOUNTER — Encounter: Payer: Self-pay | Admitting: Family Medicine

## 2018-08-06 NOTE — Telephone Encounter (Signed)
Patient received urine culture results via My Chart and has concerns.  Currently on Keflex and still has back pain.  Has sent message to PCP.  She will call PCP directly.   Routing to Dr Sabra Heck. Encounter closed.

## 2018-08-06 NOTE — Telephone Encounter (Signed)
Post ED Visit - Positive Culture Follow-up  Culture report reviewed by antimicrobial stewardship pharmacist: [place] Team []  [address] Pharm.D. []  Heide Guile, Pharm.D., BCPS AQ-ID []  Parks Neptune, Pharm.D., BCPS []  Alycia Rossetti, Pharm.D., BCPS []  Villa Quintero, Pharm.D., BCPS, AAHIVP []  Legrand Como, Pharm.D., BCPS, AAHIVP []  Salome Arnt, PharmD, BCPS []  Johnnette Gourd, PharmD, BCPS []  Hughes Better, PharmD, BCPS []  Leeroy Cha, PharmD []  Laqueta Linden, PharmD, BCPS []  Albertina Parr, PharmD  [place] Team []  Leodis Sias, PharmD []  Lindell Spar, PharmD []  Royetta Asal, PharmD []  Graylin Shiver, Rph []  Rema Fendt) Glennon Mac, PharmD []  Arlyn Dunning, PharmD []  Netta Cedars, PharmD [x]  Dia Sitter, PharmD []  Leone Haven, PharmD []  Gretta Arab, PharmD []  Theodis Shove, PharmD []  Peggyann Juba, PharmD []  Reuel Boom, PharmD   Positive urine culture Treated with Cephalexin, organism sensitive to the same and no further patient follow-up is required at this time.  Harlon Flor [facility] 08/06/2018, 11:13 AM

## 2018-08-06 NOTE — Telephone Encounter (Unsigned)
Copied from Altha [id number]. Topic: General - Other >> Aug 06, 2018 10:40 AM Rainey Pines A wrote: Patient was recently seen in ED and she would like a nurse to call her back today and go over her lab results with her today.

## 2018-08-16 ENCOUNTER — Other Ambulatory Visit: Payer: Self-pay | Admitting: Neurology

## 2018-08-16 MED FILL — ZOLPIDEM TARTRATE 10 MG TAB: 10 | 30 days supply | Qty: 30 | Fill #2

## 2018-08-17 ENCOUNTER — Encounter (INDEPENDENT_AMBULATORY_CARE_PROVIDER_SITE_OTHER): Payer: Self-pay | Admitting: Family Medicine

## 2018-08-17 ENCOUNTER — Ambulatory Visit (INDEPENDENT_AMBULATORY_CARE_PROVIDER_SITE_OTHER): Payer: 59 | Admitting: Family Medicine

## 2018-08-17 ENCOUNTER — Other Ambulatory Visit: Payer: Self-pay

## 2018-08-17 VITALS — BP 121/70 | HR 74 | Temp 97.8°F | Ht 61.0 in | Wt 175.0 lb

## 2018-08-17 DIAGNOSIS — F419 Anxiety disorder, unspecified: Secondary | ICD-10-CM | POA: Diagnosis not present

## 2018-08-17 DIAGNOSIS — Z6833 Body mass index (BMI) 33.0-33.9, adult: Secondary | ICD-10-CM | POA: Diagnosis not present

## 2018-08-17 DIAGNOSIS — E669 Obesity, unspecified: Secondary | ICD-10-CM

## 2018-08-17 DIAGNOSIS — E559 Vitamin D deficiency, unspecified: Secondary | ICD-10-CM

## 2018-08-18 MED FILL — CYANOCOBALAMIN 1,000 MCG/ML: 1000 | 30 days supply | Qty: 1 | Fill #2

## 2018-08-18 MED FILL — AJOVY 225 MG/1.5ML SOSY: 225 | 30 days supply | Qty: 2 | Fill #7

## 2018-08-18 MED FILL — DIAZEPAM 10 MG TABS: 10 | 30 days supply | Qty: 60 | Fill #2

## 2018-08-18 MED FILL — BUSPIRONE HCL 7.5 MG TABS: 7.5 | 30 days supply | Qty: 90 | Fill #1

## 2018-08-18 NOTE — Progress Notes (Signed)
Office: [phone]  /  Fax: [phone]   HPI:   Chief Complaint: OBESITY Denise Rice is here to discuss her progress with her obesity treatment plan. She is on the Category 1 plan and is following her eating plan approximately 90-95 % of the time. She states she is exercising 0 minutes 0 times per week. Denise Rice has done well on her Category 1 plan. She was fine with the structure and her hunger was controlled. She was able to eat all of her food.  Her weight is 175 lb (79.4 kg) today and has had a weight loss of 3 pounds over a period of 3 weeks since her last visit. She has lost 3 lbs since starting treatment with [place].  Vitamin D Deficiency Denise Rice has a diagnosis of vitamin D deficiency. She is currently taking prescription Vit D, but level is not yet at goal. She denies nausea, vomiting or muscle weakness.  Anxiety Denise Rice is stable on Valium. She notes increased stress working from home and she feels therapy with Dr. Mallie Rice has helped with emotional eating.  ASSESSMENT AND PLAN:  Vitamin D deficiency  Anxiety  Class 1 obesity with serious comorbidity and body mass index (BMI) of 33.0 to 33.9 in adult, unspecified obesity type  PLAN:  Vitamin D Deficiency Denise Rice was informed that low vitamin D levels contributes to fatigue and are associated with obesity, breast, and colon cancer. Denise Rice agrees to continue taking prescription Vit D as is and will follow up for routine testing of vitamin D, at least 2-3 times per year. She was informed of the risk of over-replacement of vitamin D and agrees to not increase her dose unless she discusses this with [place] first. We will recheck labs in 3 months. Denise Rice agrees to follow up with our clinic in 2 weeks.  Anxiety We discussed behavior modification techniques today to help Denise Rice deal with her emotional eating and anxiety. She is to continue therapy with Dr. Mallie Rice, and she agrees to follow up with our clinic in 2 weeks.  I spent > than 50% of the 25  minute visit on counseling as documented in the note.  Obesity Denise Rice is currently in the action stage of change. As such, her goal is to continue with weight loss efforts She has agreed to follow the Category 1 plan Denise Rice has been instructed to work up to a goal of 150 minutes of combined cardio and strengthening exercise per week for weight loss and overall health benefits. We discussed the following Behavioral Modification Strategies today: increasing lean protein intake, work on meal planning and easy cooking plans and emotional eating strategies   Denise Rice has agreed to follow up with our clinic in 2 weeks. She was informed of the importance of frequent follow up visits to maximize her success with intensive lifestyle modifications for her multiple health conditions.  ALLERGIES: Allergies  Allergen Reactions  . Topiramate Nausea Only    MEDICATIONS: Current Outpatient Medications on File Prior to Visit  Medication Sig Dispense Refill  . Biotin 5000 MCG CAPS Take 5,000 mcg by mouth daily.     . busPIRone (BUSPAR) 7.5 MG tablet TAKE 1 TABLET BY MOUTH 3 TIMES DAILY AS NEEDED FOR ANXIETY 90 tablet 1  . cyanocobalamin (,VITAMIN B-12,) 1000 MCG/ML injection Inject 1 mL (1,000 mcg total) into the muscle every 30 (thirty) days. 1 mL 12  . cyanocobalamin 2000 MCG tablet Take 2,000 mcg by mouth daily.    Marland Kitchen escitalopram (LEXAPRO) 20 MG tablet Take 1  tablet (20 mg total) by mouth daily. 90 tablet 3  . ferrous sulfate 325 (65 FE) MG tablet Take 325 mg by mouth daily with breakfast.    . Fremanezumab-vfrm (AJOVY) 225 MG/1.5ML SOSY Inject 225 mg into the skin every 30 (thirty) days. 1 Syringe 11  . Multiple Vitamin (MULTIVITAMIN WITH MINERALS) TABS tablet Take 1 tablet by mouth daily.    . Syringe/Needle, Disp, (SYRINGE 3CC/25GX1") 25G X 1" 3 ML MISC Use once monthly for injection of Viatmin B-12 50 each 0  . zolpidem (AMBIEN) 10 MG tablet Take 1 tablet (10 mg total) by mouth at bedtime as needed for  sleep. 30 tablet 5   No current facility-administered medications on file prior to visit.     PAST MEDICAL HISTORY: Past Medical History:  Diagnosis Date  . Anal condyloma   . Anxiety   . Constipation   . Deafness in left ear   . Depression   . Fatigue   . Gallbladder problem   . Hearing loss, sensorineural, high frequency    RIGHT EAR  . 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 Last eruption 3/12  . History of peptic ulcer   . Infertility, female   . Lactose intolerance   . Otosclerosis of left ear   . Pancreatic disease   . PCO (polycystic ovaries)   . PCOS (polycystic ovarian syndrome) 03/11/2017  . PONV (postoperative nausea and vomiting)   . 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 Vertigo   . Vitamin B12 deficiency 07/29/2018   Started IM injections monthly 06/2018  . Vitamin D deficiency   . Wears hearing aid    both ears    PAST SURGICAL HISTORY: Past Surgical History:  Procedure Laterality Date  . BLEPHAROPLASTY  03-05-2000  . CRANIOTOMY  08/2014   to repair dehescence in right ear  . HAMMER TOE SURGERY Left 08-29-2013   2nd toe  . IMPLANTATION BONE ANCHORED HEARING AID Left 07/27/2008   left temporal bone;now removed  . LAPAROSCOPIC CHOLECYSTECTOMY  03-31-2005  . LAPAROSCOPIC GASTRIC BANDING  02-08-2007  . LAPAROSCOPY N/A 11/17/2017   Procedure: LAPAROSCOPY LYSIS OF ADHESIONS FOR SMALL BOWEL OBSTRUCTION;  Surgeon: Excell Seltzer, MD;  Location: [place];  Service: General;  Laterality: N/A;  . LASER ABLATION CONDOLAMATA N/A 07/30/2012   Procedure: LASER ABLATION CONDOLAMATA;  Surgeon: Leighton Ruff, MD;  Location: [facility];  Service: General;  Laterality: N/A;  . LASIK Bilateral 2002  . LYSIS OF ADHESION N/A 11/17/2017   Procedure: LYSIS OF ADHESION;  Surgeon: Excell Seltzer, MD;  Location: [place];  Service: General;  Laterality: N/A;  . ROUX-EN-Y GASTRIC BYPASS  AUG 2013  .  [place];  1994 x2;  02-03-1998  . STRABISMUS SURGERY Right   . VAGINAL HYSTERECTOMY  2007   fibroids    SOCIAL HISTORY: Social History   Tobacco Use  . Smoking status: Never Smoker  . Smokeless tobacco: Never Used  Substance Use Topics  . Alcohol use: No    Alcohol/week: 0.0 standard drinks  . Drug use: No    FAMILY HISTORY: Family History  Problem Relation Age of Onset  . Heart attack Mother   . Heart disease Mother   . Diabetes Mother   . Hypertension Mother   . Hyperlipidemia Mother   . Stroke Mother   . Kidney disease Mother   . Thyroid disease Mother   . Anxiety  disorder Mother   . Obesity Mother   . Cancer Father        unknown primary  . Diabetes Father   . Hypertension Father   . Hyperlipidemia Father   . Heart disease Father   . Stroke Father   . Kidney disease Father   . Obesity Father   . Stroke Sister   . Diabetes Sister   . Diabetes Maternal Aunt   . Heart disease Maternal Aunt   . Cancer Paternal Aunt   . Cancer Paternal Uncle   . Colon cancer Neg Hx   . Colon polyps Neg Hx     ROS: Review of Systems  Constitutional: Positive for weight loss.  Gastrointestinal: Negative for nausea and vomiting.  Musculoskeletal:       Negative muscle weakness  Psychiatric/Behavioral:       + Anxiety    PHYSICAL EXAM: Blood pressure 121/70, pulse 74, temperature 97.8 F (36.6 C), temperature source Oral, height 5\' 1"  (1.549 m), weight 175 lb (79.4 kg), last menstrual period 01/13/2005, SpO2 97 %. Body mass index is 33.07 kg/m. Physical Exam Vitals signs reviewed.  Constitutional:      Appearance: Normal appearance. She is obese.  Cardiovascular:     Rate and Rhythm: Normal rate.     Pulses: Normal pulses.  Pulmonary:     Effort: Pulmonary effort is normal.     Breath sounds: Normal breath sounds.  Musculoskeletal: Normal range of motion.  Skin:    General: Skin is warm and dry.  Neurological:     Mental Status: She is alert  and oriented to person, place, and time.  Psychiatric:        Mood and Affect: Mood normal.        Behavior: Behavior normal.     RECENT LABS AND TESTS: BMET    Component Value Date/Time   NA 140 08/03/2018 0853   K 4.9 08/03/2018 0853   CL 101 08/03/2018 0853   CO2 25 08/03/2018 0853   GLUCOSE 66 08/03/2018 0853   GLUCOSE 106 (H) 08/01/2018 2201   BUN 15 08/03/2018 0853   CREATININE 0.91 08/03/2018 0853   CREATININE 0.64 09/07/2015 1014   CALCIUM 8.8 08/03/2018 0853   GFRNONAA 66 08/03/2018 0853   GFRAA 77 08/03/2018 0853   Lab Results  Component Value Date   HGBA1C 5.6 08/03/2018   HGBA1C 5.3 02/26/2018   Lab Results  Component Value Date   INSULIN 3.7 08/03/2018   CBC    Component Value Date/Time   WBC 6.1 08/01/2018 2201   RBC 4.50 08/01/2018 2201   HGB 12.1 08/01/2018 2201   HGB 12.5 02/26/2018 1032   HCT 37.1 08/01/2018 2201   HCT 36.8 02/26/2018 1032   PLT 239 08/01/2018 2201   PLT 256 02/26/2018 1032   MCV 82.4 08/01/2018 2201   MCV 81 02/26/2018 1032   MCH 26.9 08/01/2018 2201   MCHC 32.6 08/01/2018 2201   RDW 13.4 08/01/2018 2201   RDW 14.3 02/26/2018 1032   LYMPHSABS 1.2 08/01/2018 2201   LYMPHSABS 1.6 02/26/2018 1032   MONOABS 0.4 08/01/2018 2201   EOSABS 0.1 08/01/2018 2201   EOSABS 0.1 02/26/2018 1032   BASOSABS 0.1 08/01/2018 2201   BASOSABS 0.0 02/26/2018 1032   Iron/TIBC/Ferritin/ %Sat    Component Value Date/Time   FERRITIN 13 06/16/2012 1053   Lipid Panel     Component Value Date/Time   CHOL 163 08/03/2018 0853   TRIG 107 08/03/2018 0853  HDL 75 08/03/2018 0853   CHOLHDL 2 06/23/2018 1151   VLDL 20.2 06/23/2018 1151   LDLCALC 67 08/03/2018 0853   Hepatic Function Panel     Component Value Date/Time   PROT 6.9 08/03/2018 0853   ALBUMIN 4.0 08/03/2018 0853   AST 24 08/03/2018 0853   ALT 14 08/03/2018 0853   ALKPHOS 89 08/03/2018 0853   BILITOT 0.3 08/03/2018 0853   BILIDIR 0.2 09/07/2015 1014   IBILI 0.3 09/07/2015  1014      Component Value Date/Time   TSH 4.660 (H) 08/03/2018 0853   TSH 1.47 06/23/2018 1151   TSH 2.210 07/02/2016 1042      OBESITY BEHAVIORAL INTERVENTION VISIT  Today's visit was # 2   Starting weight: 178 lbs Starting date: 08/03/2018 Today's weight : 175 lbs  Today's date: 08/17/2018 Total lbs lost to date: 3    ASK: We discussed the diagnosis of obesity with [name] today and Deetta agreed to give [place] permission to discuss obesity behavioral modification therapy today.  ASSESS: Lizvet has the diagnosis of obesity and her BMI today is 33.08 Keniyah is in the action stage of change   ADVISE: Terina was educated on the multiple health risks of obesity as well as the benefit of weight loss to improve her health. She was advised of the need for long term treatment and the importance of lifestyle modifications to improve her current health and to decrease her risk of future health problems.  AGREE: Multiple dietary modification options and treatment options were discussed and  Phebe agreed to follow the recommendations documented in the above note.  ARRANGE: Kathlen was educated on the importance of frequent visits to treat obesity as outlined per CMS and USPSTF guidelines and agreed to schedule her next follow up appointment today.  I, Trixie Dredge, am acting as transcriptionist for Dennard Nip, MD I have reviewed the above documentation for accuracy and completeness, and I agree with the above. -Dennard Nip, MD

## 2018-08-23 NOTE — Progress Notes (Addendum)
Office: [phone]  /  Fax: [phone]    Date: August 24, 2018   Appointment Start Time: 4:05pm Duration: 27 minutes Provider: Glennie Isle, Psy.D. Type of Session: Individual Therapy  Location of Patient: Home Location of Provider: [facility] Type of Contact: Telepsychological Visit via [employer] Web[employer]   Session Content: Denise Rice is a 68 y.o. female presenting via [facility] for a follow-up appointment to address the previously established treatment goal of decreasing emotional eating. Of note, this provider called Denise Rice at 4:02pm as she did not present for today's appointment. A HIPAA compliant voicemail was left requesting a call back. Denise Rice called back and this provider re-sent the e-mail with the secure link for today's appointment. As such, today's appointment was initiated 5 minutes late. Today's appointment was a telepsychological visit, as this provider's clinic is seeing a limited number of patients for in-person visits due to COVID-19. Therapeutic services will resume to in-person appointments once deemed appropriate. Denise Rice expressed understanding regarding the rationale for telepsychological services, and provided verbal consent for today's appointment. Prior to proceeding with today's appointment, Denise Rice's physical location at the time of this appointment was obtained. Denise Rice reported she was at home and provided the address. In the event of technical difficulties, Denise Rice shared a phone number she could be reached at. Denise Rice and this provider participated in today's telepsychological service. Also, Denise Rice denied anyone else being present in the room or on the WebEx appointment.  This provider conducted a brief check-in and verbally administered the PHQ-9 and GAD-7. Denise Rice shared she lost 3 pounds. She further shared, "Sunday was particularly difficult for me." She believes the anticipatory anxiety of working from home contributed to her eating habits. She also believes  she had "occassional" episodes of emotional eating since the last appointment with this provider. Moreover, psychoeducation regarding triggers for emotional eating was provided. Denise Rice was provided a handout, and encouraged to utilize the handout between now and the next appointment to increase awareness of triggers and frequency. Denise Rice agreed. This provider also discussed behavioral strategies for specific triggers, such as placing the utensil down when conversing to avoid mindless eating. Denise Rice provided verbal consent during today's appointment for this provider to send the handout about triggers via e-mail. Denise Rice was receptive to today's session as evidenced by openness to sharing, responsiveness to feedback, and willingness to explore triggers for emotional eating.  Mental Status Examination:  Appearance: neat Behavior: cooperative Mood: euthymic Affect: mood congruent Speech: normal in rate, volume, and tone Eye Contact: appropriate Psychomotor Activity: appropriate Thought Process: linear, logical, and goal directed  Content/Perceptual Disturbances: denies suicidal and homicidal ideation, plan, and intent and no hallucinations, delusions, bizarre thinking or behavior reported or observed Orientation: time, person, place and purpose of appointment Cognition/Sensorium: memory, attention, language, and fund of knowledge intact  Insight: good Judgment: good  Structured Assessment Results: The Patient Health Questionnaire-9 (PHQ-9) is a self-report measure that assesses symptoms and severity of depression over the course of the last two weeks. Denise Rice obtained a score of 7 suggesting mild depression. Denise Rice finds the endorsed symptoms to be somewhat difficult. Little interest or pleasure in doing things 1  Feeling down, depressed, or hopeless 1  Trouble falling or staying asleep, or sleeping too much 0  Feeling tired or having little energy 3  Poor appetite or overeating 1  Feeling bad about  yourself --- or that you are a failure or have let yourself or your family down 1  Trouble concentrating on things, such as  reading the newspaper or watching television 0  Moving or speaking so slowly that other people could have noticed? Or the opposite --- being so fidgety or restless that you have been moving around a lot more than usual 0  Thoughts that you would be better off dead or hurting yourself in some way 0  PHQ-9 Score 7    The Generalized Anxiety Disorder-7 (GAD-7) is a brief self-report measure that assesses symptoms of anxiety over the course of the last two weeks. Denise Rice obtained a score of 9 suggesting mild anxiety. Denise Rice finds the endorsed symptoms to be somewhat difficult. Feeling nervous, anxious, on edge 1  Not being able to stop or control worrying 1  Worrying too much about different things 1  Trouble relaxing 3  Being so restless that it's hard to sit still 1  Becoming easily annoyed or irritable 1  Feeling afraid as if something awful might happen 1  GAD-7 Score 9   Interventions:  Conducted a brief chart review Verbal administration of PHQ-9 and GAD-7 for symptom monitoring Provided empathic reflections and validation Reviewed content from the previous session Psychoeducation provided regarding triggers for emotional eating Focused on rapport building Employed supportive psychotherapy interventions to facilitate reduced distress, and to improve coping skills with identified stressors  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 Progress is limited, as Denise Rice has just begun treatment with this provider; however, she is receptive to the interaction and interventions and rapport is being established.   Plan: Denise Rice continues to appear able and willing to participate as  evidenced by engagement in reciprocal conversation, and asking questions for clarification as appropriate. The next appointment will be scheduled in three weeks, which will be via [employer]. The next session will focus on reviewing triggers for emotional eating and the introduction of mindfulness.

## 2018-08-24 ENCOUNTER — Other Ambulatory Visit: Payer: Self-pay

## 2018-08-24 ENCOUNTER — Ambulatory Visit (INDEPENDENT_AMBULATORY_CARE_PROVIDER_SITE_OTHER): Payer: 59 | Admitting: Psychology

## 2018-08-24 DIAGNOSIS — F418 Other specified anxiety disorders: Secondary | ICD-10-CM

## 2018-08-26 IMAGING — MG 2D DIGITAL SCREENING BILATERAL MAMMOGRAM WITH CAD AND ADJUNCT TO
9 of 12 series · 9 of 28 positions shown · non-contrast
Comparison: Previous exam(s).

CLINICAL DATA: Screening.

EXAM:
2D DIGITAL SCREENING BILATERAL MAMMOGRAM WITH CAD AND ADJUNCT TOMO

[R MLO synth-2D]
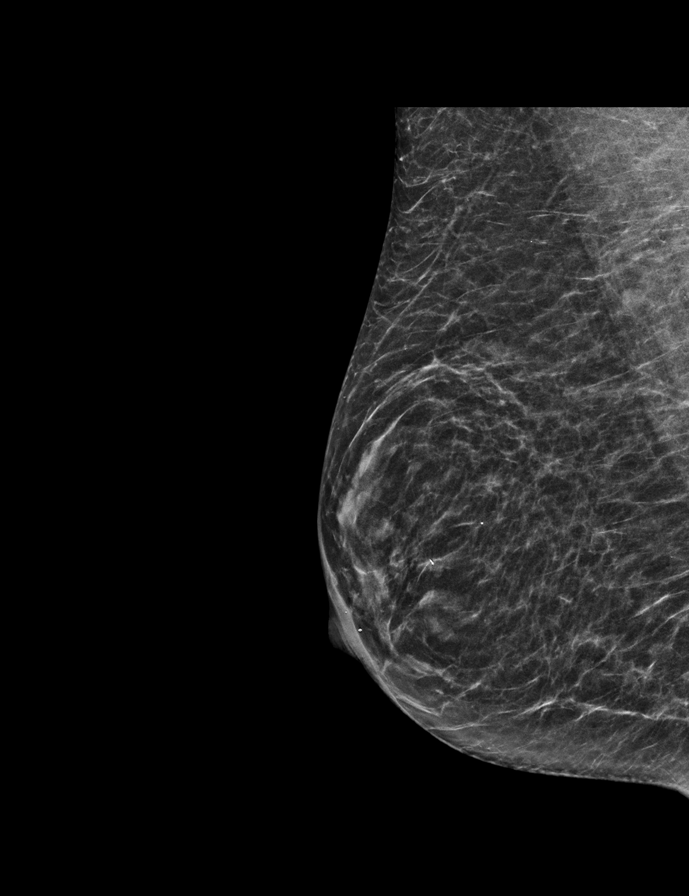

[R CC synth-2D]
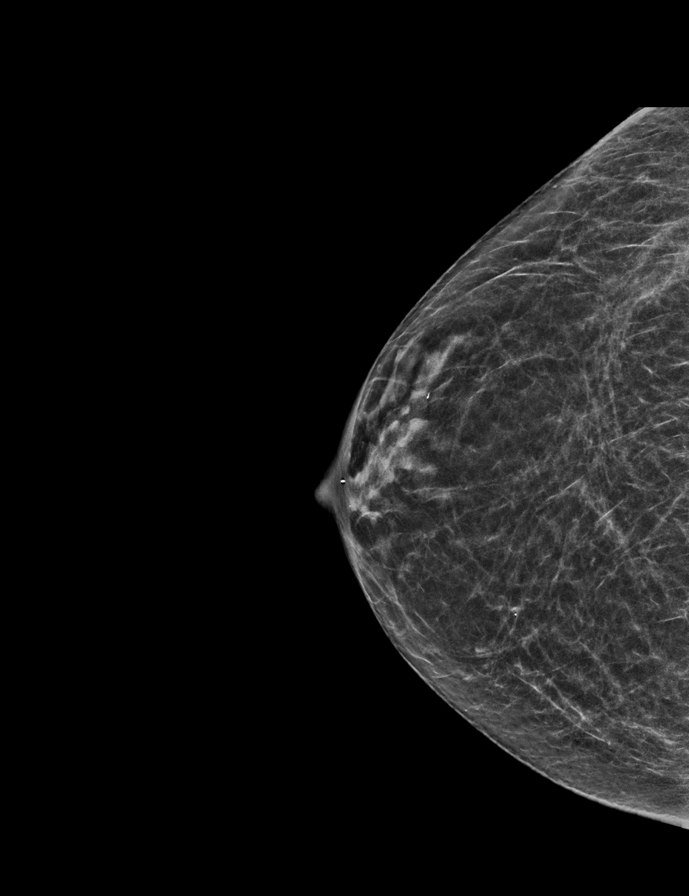

[L CC]
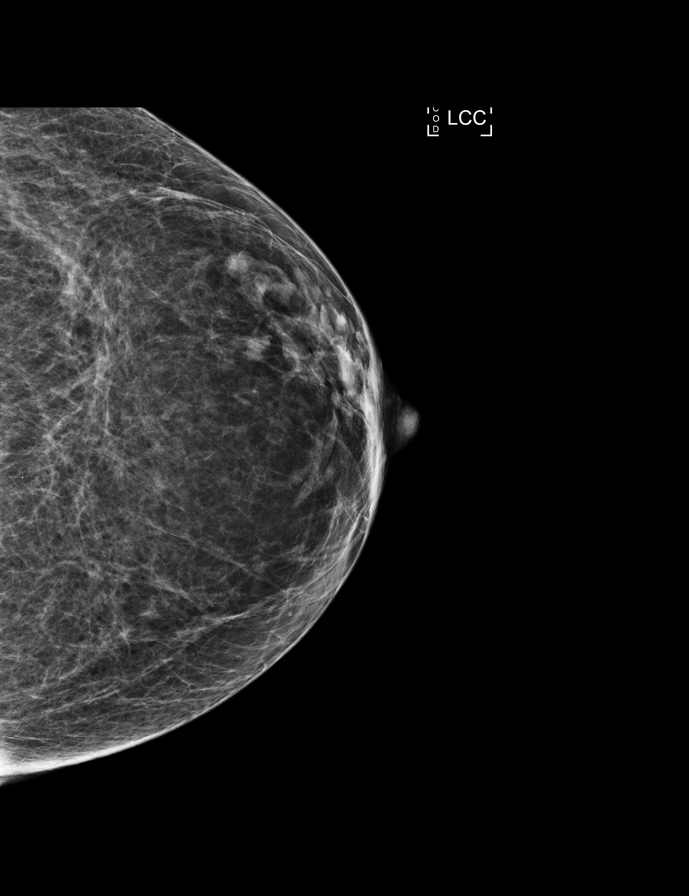

[L MLO synth-2D]
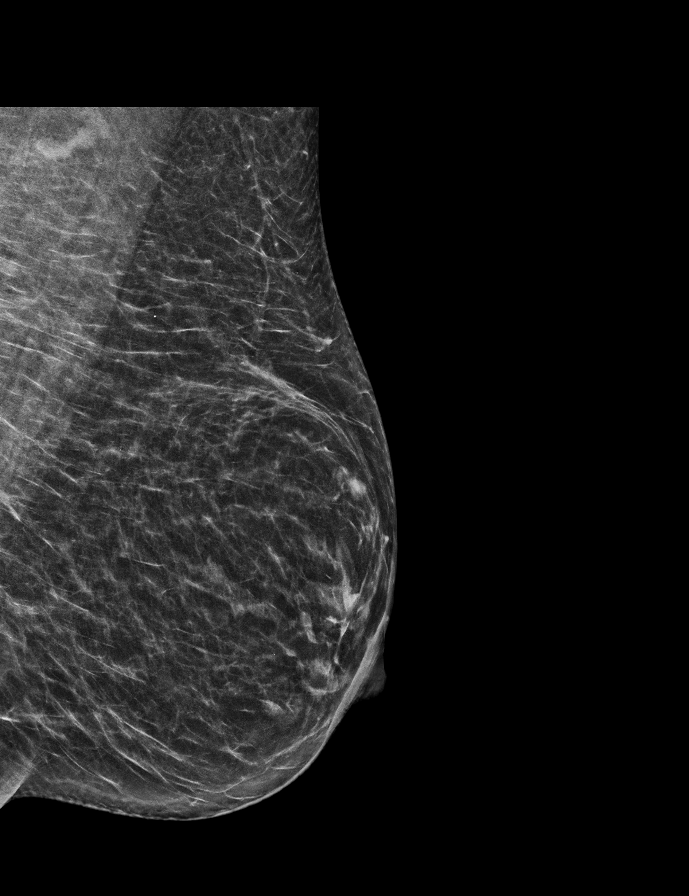

[L CC synth-2D]
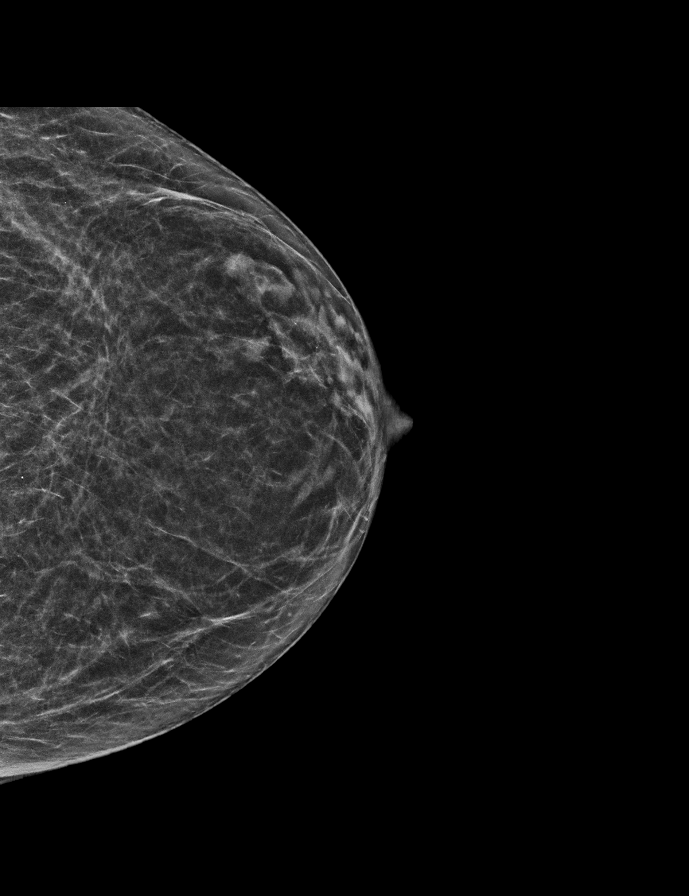

[L MLO]
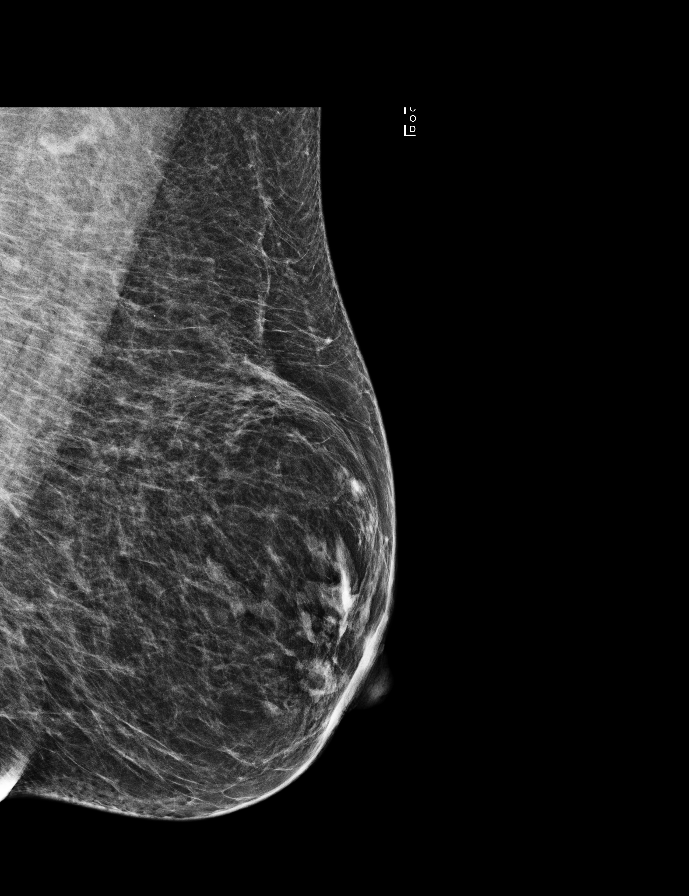

[R MLO]
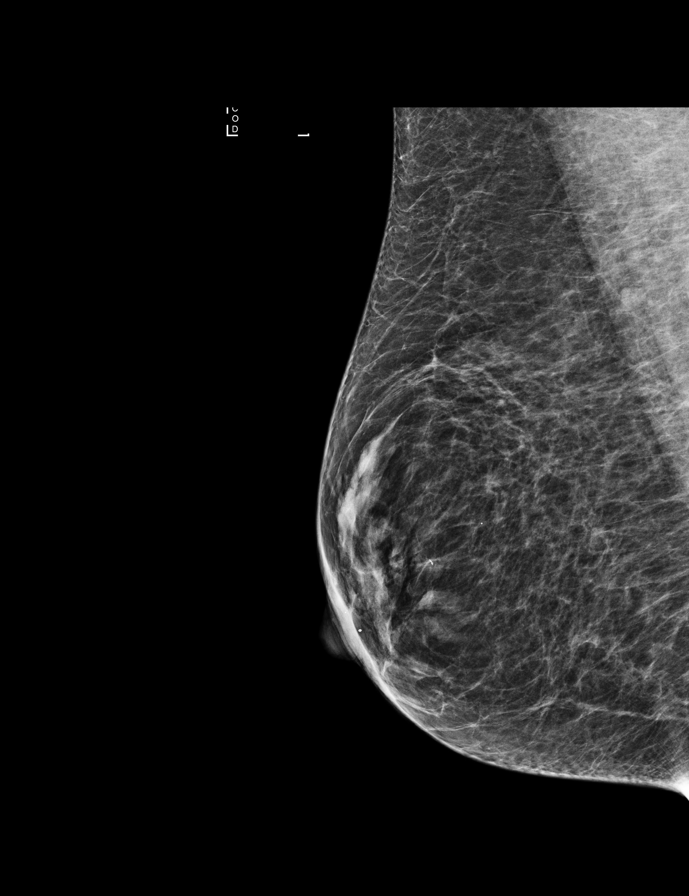

[R CC]
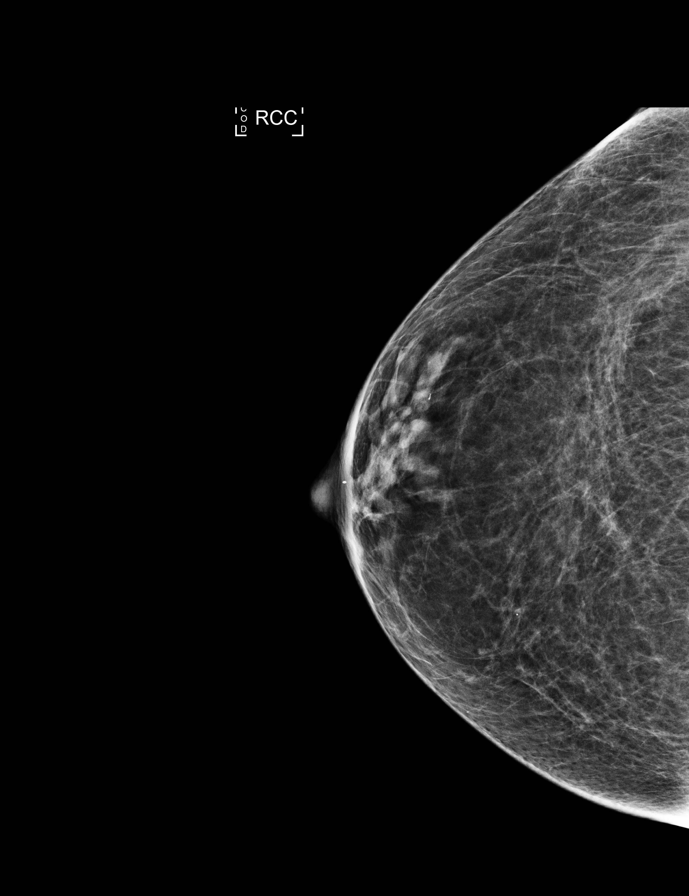

[L MLO tomo · tomo slice 27/53.0]
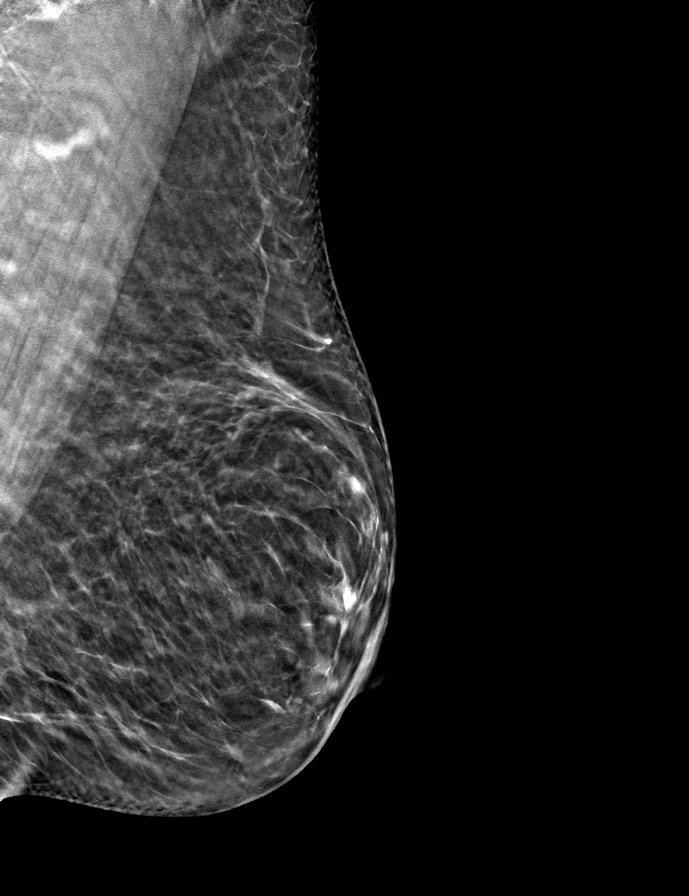

[9 of 28 positions shown; findings below may reference images not displayed]

ACR Breast Density Category b: There are scattered areas of
fibroglandular density.
FINDINGS: There are no findings suspicious for malignancy. Images were
processed with CAD.
IMPRESSION: No mammographic evidence of malignancy. A result letter of this
screening mammogram will be mailed directly to the patient.

RECOMMENDATION:
Screening mammogram in one year. (Code:97-6-RS4)

BI-RADS CATEGORY  1: Negative.

## 2018-08-26 MED FILL — ALPRAZolam 1 MG TABS: 1 | 30 days supply | Qty: 90 | Fill #4

## 2018-08-27 ENCOUNTER — Encounter (INDEPENDENT_AMBULATORY_CARE_PROVIDER_SITE_OTHER): Payer: Self-pay | Admitting: Family Medicine

## 2018-08-30 ENCOUNTER — Encounter (INDEPENDENT_AMBULATORY_CARE_PROVIDER_SITE_OTHER): Payer: Self-pay | Admitting: Family Medicine

## 2018-08-30 ENCOUNTER — Ambulatory Visit (INDEPENDENT_AMBULATORY_CARE_PROVIDER_SITE_OTHER): Payer: 59 | Admitting: Family Medicine

## 2018-08-30 ENCOUNTER — Other Ambulatory Visit: Payer: Self-pay

## 2018-08-30 VITALS — BP 106/74 | HR 74 | Temp 97.7°F | Ht 61.0 in | Wt 176.0 lb

## 2018-08-30 DIAGNOSIS — Z6833 Body mass index (BMI) 33.0-33.9, adult: Secondary | ICD-10-CM | POA: Diagnosis not present

## 2018-08-30 DIAGNOSIS — K5909 Other constipation: Secondary | ICD-10-CM

## 2018-08-30 DIAGNOSIS — E669 Obesity, unspecified: Secondary | ICD-10-CM | POA: Diagnosis not present

## 2018-08-30 DIAGNOSIS — Z9189 Other specified personal risk factors, not elsewhere classified: Secondary | ICD-10-CM

## 2018-08-30 DIAGNOSIS — F419 Anxiety disorder, unspecified: Secondary | ICD-10-CM

## 2018-08-31 ENCOUNTER — Telehealth (INDEPENDENT_AMBULATORY_CARE_PROVIDER_SITE_OTHER): Payer: Self-pay | Admitting: Psychology

## 2018-08-31 NOTE — Progress Notes (Signed)
Office: [phone]  /  Fax: [phone]   HPI:   Chief Complaint: OBESITY Denise Rice is here to discuss her progress with her obesity treatment plan. She is on the Category 1 plan and is following her eating plan approximately 95% of the time. She states she is exercising 0 minutes 0 times per week. Denise Rice is working on following her Category 1 plan. She has not been able to exercise due to COVID-19, but states she is starting with a personal trainer next week.  Her weight is 176 lb (79.8 kg) today and has had a weight gain of 1 lb since her last visit. She has lost 2 lbs since starting treatment with [place].  Constipation [name] notes decreased BM frequency and is concerned due to her history of SBO. This has been present and worsening x2 weeks.  Anxiety [name] is stable on medications but is concerned about her work merging with [employer] and isn't sleeping as well. She denies suicidal or homicidal ideations.  At risk for cardiovascular disease [name] is at a higher than average risk for cardiovascular disease due to obesity. She currently denies any chest pain.  ASSESSMENT AND PLAN:  Anxiety  Other constipation  At risk for heart disease  Class 1 obesity with serious comorbidity and body mass index (BMI) of 33.0 to 33.9 in adult, unspecified obesity type  PLAN:  Constipation [name] was advised to take OTC MiraLax 17 grams daily. She will increase her water and fiber intake and will increase exercise.  Anxiety [name] will continue her medications. CBT to help decrease anxiety and emotional eating discussed.  Cardiovascular risk counseling [name] was given extended (15 minutes) coronary artery disease prevention counseling today. She is 68 y.o. female and has risk factors for heart disease including obesity. We discussed intensive lifestyle modifications today with an emphasis on specific weight loss instructions and strategies. Pt was also informed of the importance of increasing  exercise and decreasing saturated fats to help prevent heart disease.  Obesity [name] is currently in the action stage of change. As such, her goal is to continue with weight loss efforts. She has agreed to follow the Category 1 plan. [name] has been instructed to work up to a goal of 150 minutes of combined cardio and strengthening exercise per week for weight loss and overall health benefits. We discussed the following Behavioral Modification Stratagies today: increasing lean protein intake, decreasing simple carbohydrates, work on meal planning and easy cooking plans.  [name] has agreed to follow-up with our clinic in 2-3 weeks. She was informed of the importance of frequent follow-up visits to maximize her success with intensive lifestyle modifications for her multiple health conditions.  ALLERGIES: Allergies  Allergen Reactions   Topiramate Nausea Only    MEDICATIONS: Current Outpatient Medications on File Prior to Visit  Medication Sig Dispense Refill   Biotin 5000 MCG CAPS Take 5,000 mcg by mouth daily.      busPIRone (BUSPAR) 7.5 MG tablet TAKE 1 TABLET BY MOUTH 3 TIMES DAILY AS NEEDED FOR ANXIETY 90 tablet 1   cyanocobalamin (,VITAMIN B-12,) 1000 MCG/ML injection Inject 1 mL (1,000 mcg total) into the muscle every 30 (thirty) days. 1 mL 12   cyanocobalamin 2000 MCG tablet Take 2,000 mcg by mouth daily.     escitalopram (LEXAPRO) 20 MG tablet Take 1 tablet (20 mg total) by mouth daily. 90 tablet 3   ferrous sulfate 325 (65 FE) MG tablet Take 325 mg by mouth daily with breakfast.  Fremanezumab-vfrm (AJOVY) 225 MG/1.5ML SOSY Inject 225 mg into the skin every 30 (thirty) days. 1 Syringe 11   Multiple Vitamin (MULTIVITAMIN WITH MINERALS) TABS tablet Take 1 tablet by mouth daily.     Syringe/Needle, Disp, (SYRINGE 3CC/25GX1") 25G X 1" 3 ML MISC Use once monthly for injection of Viatmin B-12 50 each 0   zolpidem (AMBIEN) 10 MG tablet Take 1 tablet (10 mg total) by mouth at  bedtime as needed for sleep. 30 tablet 5   No current facility-administered medications on file prior to visit.     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    Vertigo    Vitamin B12 deficiency 07/29/2018   Started IM injections monthly 06/2018   Vitamin D deficiency    Wears hearing aid    both ears    PAST SURGICAL HISTORY: Past Surgical History:  Procedure Laterality Date   BLEPHAROPLASTY  03-05-2000   CRANIOTOMY  08/2014   to repair dehescence in right ear   HAMMER TOE SURGERY Left 08-29-2013   2nd toe   IMPLANTATION BONE ANCHORED HEARING AID Left 07/27/2008   left temporal bone;now removed   LAPAROSCOPIC CHOLECYSTECTOMY  03-31-2005   LAPAROSCOPIC GASTRIC BANDING  02-08-2007   LAPAROSCOPY N/A 11/17/2017   Procedure: LAPAROSCOPY LYSIS OF ADHESIONS FOR SMALL BOWEL OBSTRUCTION;  Surgeon: Excell Seltzer, MD;  Location: [place];  Service: General;  Laterality: N/A;   [place] N/A 07/30/2012   Procedure: LASER ABLATION CONDOLAMATA;  Surgeon: Leighton Ruff, MD;  Location: [place];  Service: General;  Laterality: N/A;   LASIK Bilateral 2002   LYSIS OF ADHESION N/A 11/17/2017   Procedure: LYSIS OF ADHESION;  Surgeon: Excell Seltzer, MD;  Location: [place];  Service: General;  Laterality: N/A;   ROUX-EN-Y GASTRIC  BYPASS  AUG 2013   STAPEDES SURGERY Left 1985;  1994 x2;  02-03-1998   STRABISMUS SURGERY Right    VAGINAL HYSTERECTOMY  2007   fibroids    SOCIAL HISTORY: Social History   Tobacco Use   Smoking status: Never Smoker   Smokeless tobacco: Never Used  Substance Use Topics   Alcohol use: No    Alcohol/week: 0.0 standard drinks   Drug use: No    FAMILY HISTORY: Family History  Problem Relation Age of Onset   Heart attack Mother    Heart disease Mother    Diabetes Mother    Hypertension Mother    Hyperlipidemia Mother    Stroke Mother    Kidney disease Mother    Thyroid disease Mother    Anxiety disorder Mother    Obesity Mother    Cancer Father        unknown primary   Diabetes Father    Hypertension Father  Hyperlipidemia Father    Heart disease Father    Stroke Father    Kidney disease Father    Obesity Father    Stroke Sister    Diabetes Sister    Diabetes Maternal Aunt    Heart disease Maternal Aunt    Cancer Paternal Aunt    Cancer Paternal Uncle    Colon cancer Neg Hx    Colon polyps Neg Hx    ROS: Review of Systems  Gastrointestinal: Positive for constipation.  Psychiatric/Behavioral: Negative for suicidal ideas. The patient is nervous/anxious.        Negative for homicidal ideas.   PHYSICAL EXAM: Blood pressure 106/74, pulse 74, temperature 97.7 F (36.5 C), temperature source Oral, height 5\' 1"  (1.549 m), weight 176 lb (79.8 kg), last menstrual period 01/13/2005, SpO2 98 %. Body mass index is 33.25 kg/m. Physical Exam Vitals signs reviewed.  Constitutional:      Appearance: Normal appearance. She is obese.  Cardiovascular:     Rate and Rhythm: Normal rate.     Pulses: Normal pulses.  Pulmonary:     Effort: Pulmonary effort is normal.     Breath sounds: Normal breath sounds.  Musculoskeletal: Normal range of motion.  Skin:    General: Skin is warm and dry.  Neurological:     Mental Status: She is alert  and oriented to person, place, and time.  Psychiatric:        Behavior: Behavior normal.   RECENT LABS AND TESTS: BMET    Component Value Date/Time   NA 140 08/03/2018 0853   K 4.9 08/03/2018 0853   CL 101 08/03/2018 0853   CO2 25 08/03/2018 0853   GLUCOSE 66 08/03/2018 0853   GLUCOSE 106 (H) 08/01/2018 2201   BUN 15 08/03/2018 0853   CREATININE 0.91 08/03/2018 0853   CREATININE 0.64 09/07/2015 1014   CALCIUM 8.8 08/03/2018 0853   GFRNONAA 66 08/03/2018 0853   GFRAA 77 08/03/2018 0853   Lab Results  Component Value Date   HGBA1C 5.6 08/03/2018   HGBA1C 5.3 02/26/2018   Lab Results  Component Value Date   INSULIN 3.7 08/03/2018   CBC    Component Value Date/Time   WBC 6.1 08/01/2018 2201   RBC 4.50 08/01/2018 2201   HGB 12.1 08/01/2018 2201   HGB 12.5 02/26/2018 1032   HCT 37.1 08/01/2018 2201   HCT 36.8 02/26/2018 1032   PLT 239 08/01/2018 2201   PLT 256 02/26/2018 1032   MCV 82.4 08/01/2018 2201   MCV 81 02/26/2018 1032   MCH 26.9 08/01/2018 2201   MCHC 32.6 08/01/2018 2201   RDW 13.4 08/01/2018 2201   RDW 14.3 02/26/2018 1032   LYMPHSABS 1.2 08/01/2018 2201   LYMPHSABS 1.6 02/26/2018 1032   MONOABS 0.4 08/01/2018 2201   EOSABS 0.1 08/01/2018 2201   EOSABS 0.1 02/26/2018 1032   BASOSABS 0.1 08/01/2018 2201   BASOSABS 0.0 02/26/2018 1032   Iron/TIBC/Ferritin/ %Sat    Component Value Date/Time   FERRITIN 13 06/16/2012 1053   Lipid Panel     Component Value Date/Time   CHOL 163 08/03/2018 0853   TRIG 107 08/03/2018 0853   HDL 75 08/03/2018 0853   CHOLHDL 2 06/23/2018 1151   VLDL 20.2 06/23/2018 1151   LDLCALC 67 08/03/2018 0853   Hepatic Function Panel     Component Value Date/Time   PROT 6.9 08/03/2018 0853   ALBUMIN 4.0 08/03/2018 0853   AST 24 08/03/2018 0853   ALT 14 08/03/2018  0853   ALKPHOS 89 08/03/2018 0853   BILITOT 0.3 08/03/2018 0853   BILIDIR 0.2 09/07/2015 1014   IBILI 0.3 09/07/2015 1014      Component Value Date/Time    TSH 4.660 (H) 08/03/2018 0853   TSH 1.47 06/23/2018 1151   TSH 2.210 07/02/2016 1042   Results for TONYE, TANCREDI (MRN [id number]) as of 08/31/2018 08:44  Ref. Range 08/03/2018 08:53  Vitamin D, 25-Hydroxy Latest Ref Range: 30.0 - 100.0 ng/mL 28.2 (L)   OBESITY BEHAVIORAL INTERVENTION VISIT  Today's visit was #3  Starting weight: 178 lbs Starting date: 08/03/2018 Today's weight: 176 lbs  Today's date: 08/30/2018 Total lbs lost to date: 2    08/30/2018  Height 5\' 1"  (1.549 m)  Weight 176 lb (79.8 kg)  BMI (Calculated) 33.27  BLOOD PRESSURE - SYSTOLIC 854  BLOOD PRESSURE - DIASTOLIC 74   Body Fat % 62.7 %  Total Body Water (lbs) 73.2 lbs   ASK: We discussed the diagnosis of obesity with [name] today and Hae agreed to give [place] permission to discuss obesity behavioral modification therapy today.  ASSESS: Willie has the diagnosis of obesity and her BMI today is 33.3. [place] is in the action stage of change.   ADVISE: Sakura was educated on the multiple health risks of obesity as well as the benefit of weight loss to improve her health. She was advised of the need for long term treatment and the importance of lifestyle modifications to improve her current health and to decrease her risk of future health problems.  AGREE: Multiple dietary modification options and treatment options were discussed and  Maiyah agreed to follow the recommendations documented in the above note.  ARRANGE: Francille was educated on the importance of frequent visits to treat obesity as outlined per CMS and USPSTF guidelines and agreed to schedule her next follow up appointment today.  I, Michaelene Song, am acting as Location manager for Dennard Nip, MD  I have reviewed the above documentation for accuracy and completeness, and I agree with the above. -Dennard Nip, MD

## 2018-08-31 NOTE — Telephone Encounter (Signed)
  Office: [phone]  /  Fax: [phone]  Date of Call: August 31, 2018  Time of Call: 11:40am Duration of Call: 5 minutes Provider: Glennie Isle, PsyD  CONTENT: Denise Rice e-mailed this provider to request a later appointment. As such, this provider called Denise Rice and her appointment was rescheduled from September 14, 2018 to September 23, 2018. She acknowledged understanding that in the future she may utilize MyChart and/or call the clinic's office for scheduling. Additionally, Denise Rice provided a brief update about her appointment with Dr. Leafy Ro, and a brief risk assessment was completed. Denise Rice denied experiencing suicidal and homicidal ideation, plan, and intent since the last appointment with this provider.  PLAN: Denise Rice is scheduled for an appointment on September 23, 2018 at 4:00pm.

## 2018-09-04 ENCOUNTER — Encounter (HOSPITAL_COMMUNITY): Payer: Self-pay | Admitting: Emergency Medicine

## 2018-09-04 ENCOUNTER — Other Ambulatory Visit: Payer: Self-pay

## 2018-09-04 ENCOUNTER — Emergency Department (HOSPITAL_COMMUNITY): Payer: 59

## 2018-09-04 ENCOUNTER — Emergency Department (HOSPITAL_COMMUNITY)
Admission: EM | Admit: 2018-09-04 | Discharge: 2018-09-05 | Disposition: A | Discharge status: Home / Self Care | Payer: 59 | Attending: Emergency Medicine | Admitting: Emergency Medicine

## 2018-09-04 DIAGNOSIS — S0990XA Unspecified injury of head, initial encounter: Secondary | ICD-10-CM | POA: Diagnosis not present

## 2018-09-04 DIAGNOSIS — Z79899 Other long term (current) drug therapy: Secondary | ICD-10-CM | POA: Insufficient documentation

## 2018-09-04 DIAGNOSIS — R42 Dizziness and giddiness: Secondary | ICD-10-CM | POA: Diagnosis not present

## 2018-09-04 DIAGNOSIS — R55 Syncope and collapse: Secondary | ICD-10-CM | POA: Diagnosis not present

## 2018-09-04 DIAGNOSIS — W19XXXA Unspecified fall, initial encounter: Secondary | ICD-10-CM | POA: Insufficient documentation

## 2018-09-04 DIAGNOSIS — R4781 Slurred speech: Secondary | ICD-10-CM | POA: Diagnosis not present

## 2018-09-04 DIAGNOSIS — R51 Headache: Secondary | ICD-10-CM | POA: Insufficient documentation

## 2018-09-04 DIAGNOSIS — I6522 Occlusion and stenosis of left carotid artery: Secondary | ICD-10-CM | POA: Diagnosis not present

## 2018-09-04 DIAGNOSIS — S199XXA Unspecified injury of neck, initial encounter: Secondary | ICD-10-CM | POA: Diagnosis not present

## 2018-09-04 DIAGNOSIS — R519 Headache, unspecified: Secondary | ICD-10-CM

## 2018-09-04 DIAGNOSIS — R110 Nausea: Secondary | ICD-10-CM | POA: Diagnosis not present

## 2018-09-04 DIAGNOSIS — R531 Weakness: Secondary | ICD-10-CM | POA: Diagnosis not present

## 2018-09-04 DIAGNOSIS — G939 Disorder of brain, unspecified: Secondary | ICD-10-CM | POA: Diagnosis not present

## 2018-09-04 LAB — CBC WITH DIFFERENTIAL/PLATELET
Abs Immature Granulocytes: 0.02 10*3/uL (ref 0.00–0.07)
Basophils Absolute: 0 10*3/uL (ref 0.0–0.1)
Basophils Relative: 1 %
Eosinophils Absolute: 0.1 10*3/uL (ref 0.0–0.5)
Eosinophils Relative: 2 %
HCT: 38.3 % (ref 36.0–46.0)
Hemoglobin: 12.6 g/dL (ref 12.0–15.0)
Immature Granulocytes: 1 %
Lymphocytes Relative: 42 %
Lymphs Abs: 1.8 10*3/uL (ref 0.7–4.0)
MCH: 26.6 pg (ref 26.0–34.0)
MCHC: 32.9 g/dL (ref 30.0–36.0)
MCV: 81 fL (ref 80.0–100.0)
Monocytes Absolute: 0.3 10*3/uL (ref 0.1–1.0)
Monocytes Relative: 7 %
Neutro Abs: 2 10*3/uL (ref 1.7–7.7)
Neutrophils Relative %: 47 %
Platelets: 199 10*3/uL (ref 150–400)
RBC: 4.73 MIL/uL (ref 3.87–5.11)
RDW: 13.3 % (ref 11.5–15.5)
WBC: 4.2 10*3/uL (ref 4.0–10.5)
nRBC: 0 % (ref 0.0–0.2)

## 2018-09-04 LAB — COMPREHENSIVE METABOLIC PANEL
ALT: 43 U/L (ref 0–44)
AST: 52 U/L — ABNORMAL HIGH (ref 15–41)
Albumin: 3.5 g/dL (ref 3.5–5.0)
Alkaline Phosphatase: 67 U/L (ref 38–126)
Anion gap: 9 (ref 5–15)
BUN: 13 mg/dL (ref 8–23)
CO2: 25 mmol/L (ref 22–32)
Calcium: 8.3 mg/dL — ABNORMAL LOW (ref 8.9–10.3)
Chloride: 106 mmol/L (ref 98–111)
Creatinine, Ser: 0.77 mg/dL (ref 0.44–1.00)
GFR calc Af Amer: >60 mL/min (ref >60)
GFR calc non Af Amer: >60 mL/min (ref >60)
Glucose, Bld: 93 mg/dL (ref 70–99)
Potassium: 4.1 mmol/L (ref 3.5–5.1)
Sodium: 140 mmol/L (ref 135–145)
Total Bilirubin: 0.4 mg/dL (ref 0.3–1.2)
Total Protein: 6.5 g/dL (ref 6.5–8.1)

## 2018-09-04 LAB — CBG MONITORING, ED: Glucose-Capillary: 86 mg/dL (ref 70–99)

## 2018-09-04 LAB — PROTIME-INR
INR: 1.1 (ref 0.8–1.2)
Prothrombin Time: 14.1 seconds (ref 11.4–15.2)

## 2018-09-04 MED ORDER — DIPHENHYDRAMINE HCL 50 MG/ML IJ SOLN
25.0000 mg | Freq: Once | INTRAMUSCULAR | Status: AC
  Administered 2018-09-04: 25 mg via INTRAVENOUS
  Filled 2018-09-04: qty 1

## 2018-09-04 MED ORDER — HYDROMORPHONE HCL 1 MG/ML IJ SOLN
0.5000 mg | Freq: Once | INTRAMUSCULAR | Status: AC
  Administered 2018-09-04: 15:00:00 0.5 mg via INTRAVENOUS
  Filled 2018-09-04: qty 1

## 2018-09-04 MED ORDER — IOHEXOL 350 MG/ML SOLN
50.0000 mL | Freq: Once | INTRAVENOUS | Status: AC | PRN
  Administered 2018-09-04: 50 mL via INTRAVENOUS

## 2018-09-04 MED ORDER — KETOROLAC TROMETHAMINE 30 MG/ML IJ SOLN
30.0000 mg | Freq: Once | INTRAMUSCULAR | Status: AC
  Administered 2018-09-04: 30 mg via INTRAVENOUS
  Filled 2018-09-04: qty 1

## 2018-09-04 MED ORDER — METOCLOPRAMIDE HCL 5 MG/ML IJ SOLN
10.0000 mg | Freq: Once | INTRAMUSCULAR | Status: AC
  Administered 2018-09-04: 22:00:00 10 mg via INTRAVENOUS
  Filled 2018-09-04: qty 2

## 2018-09-04 MED ORDER — ACETAMINOPHEN 500 MG PO TABS: 1000.0000 mg | ORAL_TABLET | Freq: Once | ORAL | Status: DC

## 2018-09-04 NOTE — ED Triage Notes (Signed)
Pt BIB [facility] from home. Pt experienced a fall 2 hours ago. Pt unsure of how she fell. States she did hit her head on tile floor. EMS called out for stroke symptoms with right sided deficits, and slurred speech. Pt a&ox4 and gcs 15 upon EMS arrival. Stroke screen negative. Pt complaining of headache and dizziness. VSS. NAD. C-collar in place.

## 2018-09-04 NOTE — ED Notes (Signed)
Patient transported to MRI 

## 2018-09-04 NOTE — ED Provider Notes (Signed)
[facility] EMERGENCY DEPARTMENT Provider Note   CSN: [id number] Arrival date & time: 09/04/18  1427     History   Chief Complaint Chief Complaint  Patient presents with   Fall    HPI Denise Rice is a 68 y.o. female.     HPI Patient reports he fell approximately 2 hours prior to arrival.  She reports that she had a extremely sharp headache pain that had occurred in the middle of the night.  She reports that it seemed to have improved some.  She reports that just before she fell she had that severe headache again.  It is right in the middle and top of her head.  She reports she does have a history of migraines but this seems different.  She was when she fell she did hit her head pretty hard as well.  She reports that her husband came to check on her but it took about 30 seconds to a minute for her to come back around.  She reports that he reported she was not responding initially.  Triage note indicates that EMS was called for strokelike symptoms with right-sided deficit and slurred speech.  Patient is not aware that this was the case.  On EMS arrival patient stroke screen was negative.  Patient denies she perceives any weakness numbness or tingling in her extremities.  She does not have any paresthesia.  She denies any visual changes.  She denies any perception of facial numbness or weakness.  From her perspective, the worst symptom is the central headache on the top of her head and then also her head is very painful where she hit it when she fell.  Patient does note that for several weeks or more there have been some symptoms noted of either slurred speech intermittently or dizziness.  She reports the symptoms did not just develop after her fall.  She does take regular injections for migraines and has diagnosis of vertigo pre-existing.  She does however qualify today symptoms as being different. 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    Vertigo    Vitamin B12 deficiency 07/29/2018   Started IM injections monthly 06/2018   Vitamin D deficiency    Wears hearing aid    both ears    Patient Active Problem List   Diagnosis Date Noted   Vitamin B12 deficiency 07/29/2018   Anxiety 03/14/2018   Elschnig bodies following cataract surgery, 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   Small bowel obstruction ([place]) 11/17/2017   Chronic vertigo 03/11/2017   Major depression, chronic 03/11/2017   PCOS (polycystic ovarian syndrome) 03/11/2017   Insomnia 09/08/2015   Bilateral sensorineural hearing loss 09/06/2014   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 06/25/2012  History of laparoscopic adjustable gastric banding 01/22/2011   Vitamin D deficiency 01/15/2008    Past Surgical History:  Procedure Laterality Date   BLEPHAROPLASTY  03-05-2000   CRANIOTOMY  08/2014   to repair dehescence in right ear   HAMMER TOE SURGERY Left 08-29-2013   2nd toe   IMPLANTATION BONE ANCHORED HEARING AID Left 07/27/2008   left temporal bone;now removed   LAPAROSCOPIC CHOLECYSTECTOMY  03-31-2005   LAPAROSCOPIC GASTRIC BANDING  02-08-2007   LAPAROSCOPY N/A 11/17/2017   Procedure: LAPAROSCOPY LYSIS OF ADHESIONS FOR SMALL BOWEL OBSTRUCTION;  Surgeon: Excell Seltzer, MD;  Location: [place];  Service: General;  Laterality: N/A;     LASER ABLATION CONDOLAMATA N/A 07/30/2012   Procedure: LASER ABLATION CONDOLAMATA;  Surgeon: Leighton Ruff, MD;  Location: [place];  Service: General;  Laterality: N/A;   LASIK Bilateral 2002   LYSIS OF ADHESION N/A 11/17/2017   Procedure: LYSIS OF ADHESION;  Surgeon: Excell Seltzer, MD;  Location: [place];  Service: General;  Laterality: N/A;   ROUX-EN-Y GASTRIC BYPASS  AUG 2013   [place];  1994 x2;  02-03-1998   STRABISMUS SURGERY Right    VAGINAL HYSTERECTOMY  2007   fibroids     OB History    Gravida  0   Para      Term      Preterm      AB      Living        SAB      TAB      Ectopic      Multiple      Live Births               Home Medications    Prior to Admission medications   Medication Sig Start Date End Date Taking? Authorizing Provider  Biotin 5000 MCG CAPS Take 5,000 mcg by mouth daily.    Yes [provider]  busPIRone (BUSPAR) 7.5 MG tablet TAKE 1 TABLET BY MOUTH 3 TIMES DAILY AS NEEDED FOR ANXIETY Patient taking differently: Take 7.5 mg by mouth 3 (three) times daily. For anxiety 07/14/18  Yes [name], MD  cholecalciferol (VITAMIN D3) 25 MCG (1000 UT) tablet Take 1,000 Units by mouth daily.   Yes [provider]  cyanocobalamin (,VITAMIN B-12,) 1000 MCG/ML injection Inject 1 mL (1,000 mcg total) into the muscle every 30 (thirty) days. 06/24/18  Yes [name], MD  cyanocobalamin 2000 MCG tablet Take 2,000 mcg by mouth daily.   Yes [provider]  diazepam (VALIUM) 10 MG tablet Take 20 mg by mouth daily as needed for anxiety.   Yes [provider]  escitalopram (LEXAPRO) 20 MG tablet Take 1 tablet (20 mg total) by mouth daily. 08/27/17  Yes [name], MD  ferrous sulfate 325 (65 FE) MG tablet Take 325 mg by mouth daily with breakfast.   Yes [provider]  Fremanezumab-vfrm (AJOVY) 225 MG/1.5ML SOSY Inject 225 mg into the skin every 30  (thirty) days. 02/02/18  Yes Melvenia Beam, MD  Multiple Vitamin (MULTIVITAMIN WITH MINERALS) TABS tablet Take 1 tablet by mouth daily.   Yes [provider]  zolpidem (AMBIEN) 10 MG tablet Take 1 tablet (10 mg total) by mouth at bedtime as needed for sleep. 06/23/18  Yes [name], MD  Syringe/Needle, Disp, (SYRINGE 3CC/25GX1") 25G X 1" 3 ML MISC Use once monthly for injection of Viatmin B-12 06/24/18   [name],  MD    Family History Family History  Problem Relation Age of Onset   Heart attack Mother    Heart disease Mother    Diabetes Mother    Hypertension Mother    Hyperlipidemia Mother    Stroke Mother    Kidney disease Mother    Thyroid disease Mother    Anxiety disorder Mother    Obesity Mother    Cancer Father        unknown primary   Diabetes Father    Hypertension Father    Hyperlipidemia Father    Heart disease Father    Stroke Father    Kidney disease Father    Obesity Father    Stroke Sister    Diabetes Sister    Diabetes Maternal Aunt    Heart disease Maternal Aunt    Cancer Paternal Aunt    Cancer Paternal Uncle    Colon cancer Neg Hx    Colon polyps Neg Hx     Social History Social History   Tobacco Use   Smoking status: Never Smoker   Smokeless tobacco: Never Used  Substance Use Topics   Alcohol use: No    Alcohol/week: 0.0 standard drinks   Drug use: No     Allergies   Topiramate   Review of Systems Review of Systems 10 Systems reviewed and are negative for acute change except as noted in the HPI.   Physical Exam Updated Vital Signs BP (!) 104/91    Pulse (!) 52    Temp (!) 97.4 F (36.3 C) (Oral)    Resp 14    Ht 5\' 1"  (1.549 m)    Wt 78 kg    LMP 01/13/2005    SpO2 100%    BMI 32.50 kg/m   Physical Exam Constitutional:      Appearance: Normal appearance.  HENT:     Head:     Comments: Patient endorses a lot of tenderness at the right parietal scalp.  No laceration or  bleeding.  At this time I do not perceive any large hematoma.    Nose: Nose normal.     Mouth/Throat:     Mouth: Mucous membranes are moist.     Pharynx: Oropharynx is clear.  Eyes:     Extraocular Movements: Extraocular movements intact.     Conjunctiva/sclera: Conjunctivae normal.     Pupils: Pupils are equal, round, and reactive to light.  Neck:     Comments: Cervical collar maintained.  No swelling or anterior soft tissue changes.  Voice is clear without stridor.  Patient is not endorsing pain. Cardiovascular:     Rate and Rhythm: Normal rate and regular rhythm.  Pulmonary:     Effort: Pulmonary effort is normal.     Breath sounds: Normal breath sounds.  Abdominal:     General: There is no distension.     Palpations: Abdomen is soft.     Tenderness: There is no abdominal tenderness.  Musculoskeletal: Normal range of motion.        General: No swelling, tenderness, deformity or signs of injury.     Right lower leg: No edema.     Left lower leg: No edema.  Skin:    General: Skin is warm and dry.  Neurological:     General: No focal deficit present.     Mental Status: She is alert and oriented to person, place, and time.     Cranial Nerves: No cranial nerve deficit.  Sensory: No sensory deficit.     Motor: No weakness.     Coordination: Coordination normal.     Comments: Cognitive function normal.  Speech normal and clear.  Content normal.  No cranial nerve deficit.  No pronator drift.  Motor strength 5\5 upper and lower.  No subjective sensory differential or decrease sensation to light touch.  Psychiatric:        Mood and Affect: Mood normal.      ED Treatments / Results  Labs (all labs ordered are listed, but only abnormal results are displayed) Labs Reviewed  COMPREHENSIVE METABOLIC PANEL - Abnormal; Notable for the following components:      Result Value   Calcium 8.3 (*)    AST 52 (*)    All other components within normal limits  CBC WITH  DIFFERENTIAL/PLATELET  PROTIME-INR  CBG MONITORING, ED    EKG None  Radiology Ct Angio Head W Or Wo Contrast  Result Date: 09/04/2018 CLINICAL DATA:  Worst headache of life EXAM: CT ANGIOGRAPHY HEAD AND NECK TECHNIQUE: Multidetector CT imaging of the head and neck was performed using the standard protocol during bolus administration of intravenous contrast. Multiplanar CT image reconstructions and MIPs were obtained to evaluate the vascular anatomy. Carotid stenosis measurements (when applicable) are obtained utilizing NASCET criteria, using the distal internal carotid diameter as the denominator. CONTRAST:  77mL OMNIPAQUE IOHEXOL 350 MG/ML SOLN COMPARISON:  Head and cervical spine CT earlier today FINDINGS: CTA NECK FINDINGS Aortic arch: Aberrant right subclavian artery.  Otherwise negative. Right carotid system: Vessels are smooth and widely patent. No atheromatous changes. Left carotid system: Proximal common carotid tortuosity. Mild calcified plaque at the ICA bulb. No stenosis or ulceration. Negative for beading. Vertebral arteries: The right vertebral artery arises from the right common carotid. Mild calcification at the right subclavian origin. No subclavian stenosis. Mild dominance of the left vertebral artery. No stenosis or beading. Skeleton: No acute or aggressive finding. Other neck: No acute finding. Bone anchored hearing aid on the left. Probable left ossicular prosthesis. There has been a craniotomy at the right squamosal temporal bone reportedly for ear dehiscence repair Upper chest: Negative Review of the MIP images confirms the above findings CTA HEAD FINDINGS Anterior circulation: No significant stenosis, proximal occlusion, aneurysm, or vascular malformation. Posterior circulation: No significant stenosis, proximal occlusion, aneurysm, or vascular malformation. Venous sinuses: Patent Anatomic variants: None significant Review of the MIP images confirms the above findings IMPRESSION: 1.  No emergent finding. 2. Minimal atherosclerotic calcification. Electronically Signed   By: Monte Fantasia M.D.   On: 09/04/2018 17:58   Ct Head Wo Contrast  Result Date: 09/04/2018 CLINICAL DATA:  Syncope, fall EXAM: CT HEAD WITHOUT CONTRAST CT CERVICAL SPINE WITHOUT CONTRAST TECHNIQUE: Multidetector CT imaging of the head and cervical spine was performed following the standard protocol without intravenous contrast. Multiplanar CT image reconstructions of the cervical spine were also generated. COMPARISON:  None. FINDINGS: CT HEAD FINDINGS Brain: No evidence of acute infarction, hemorrhage, hydrocephalus, extra-axial collection or mass lesion/mass effect. Vascular: No hyperdense vessel or unexpected calcification. Skull: Normal. Negative for fracture or focal lesion. Sinuses/Orbits: The visualized paranasal sinuses are essentially clear. The mastoid air cells are unopacified. Other: None. CT CERVICAL SPINE FINDINGS Alignment: Normal cervical lordosis. Skull base and vertebrae: No acute fracture. No primary bone lesion or focal pathologic process. Soft tissues and spinal canal: No prevertebral fluid or swelling. No visible canal hematoma. Disc levels: Mild to moderate degenerative changes of the mid cervical spine. Spinal  canal is patent. Upper chest: Visualized lung apices are clear. Other: Visualized thyroid is grossly unremarkable. IMPRESSION: Normal head CT. No evidence of traumatic injury to the cervical spine. Mild to moderate degenerative changes. Electronically Signed   By: Julian Hy M.D.   On: 09/04/2018 15:59   Ct Angio Neck W And/or Wo Contrast  Result Date: 09/04/2018 CLINICAL DATA:  Worst headache of life EXAM: CT ANGIOGRAPHY HEAD AND NECK TECHNIQUE: Multidetector CT imaging of the head and neck was performed using the standard protocol during bolus administration of intravenous contrast. Multiplanar CT image reconstructions and MIPs were obtained to evaluate the vascular anatomy.  Carotid stenosis measurements (when applicable) are obtained utilizing NASCET criteria, using the distal internal carotid diameter as the denominator. CONTRAST:  10mL OMNIPAQUE IOHEXOL 350 MG/ML SOLN COMPARISON:  Head and cervical spine CT earlier today FINDINGS: CTA NECK FINDINGS Aortic arch: Aberrant right subclavian artery.  Otherwise negative. Right carotid system: Vessels are smooth and widely patent. No atheromatous changes. Left carotid system: Proximal common carotid tortuosity. Mild calcified plaque at the ICA bulb. No stenosis or ulceration. Negative for beading. Vertebral arteries: The right vertebral artery arises from the right common carotid. Mild calcification at the right subclavian origin. No subclavian stenosis. Mild dominance of the left vertebral artery. No stenosis or beading. Skeleton: No acute or aggressive finding. Other neck: No acute finding. Bone anchored hearing aid on the left. Probable left ossicular prosthesis. There has been a craniotomy at the right squamosal temporal bone reportedly for ear dehiscence repair Upper chest: Negative Review of the MIP images confirms the above findings CTA HEAD FINDINGS Anterior circulation: No significant stenosis, proximal occlusion, aneurysm, or vascular malformation. Posterior circulation: No significant stenosis, proximal occlusion, aneurysm, or vascular malformation. Venous sinuses: Patent Anatomic variants: None significant Review of the MIP images confirms the above findings IMPRESSION: 1. No emergent finding. 2. Minimal atherosclerotic calcification. Electronically Signed   By: Monte Fantasia M.D.   On: 09/04/2018 17:58   Ct Cervical Spine Wo Contrast  Result Date: 09/04/2018 CLINICAL DATA:  Syncope, fall EXAM: CT HEAD WITHOUT CONTRAST CT CERVICAL SPINE WITHOUT CONTRAST TECHNIQUE: Multidetector CT imaging of the head and cervical spine was performed following the standard protocol without intravenous contrast. Multiplanar CT image  reconstructions of the cervical spine were also generated. COMPARISON:  None. FINDINGS: CT HEAD FINDINGS Brain: No evidence of acute infarction, hemorrhage, hydrocephalus, extra-axial collection or mass lesion/mass effect. Vascular: No hyperdense vessel or unexpected calcification. Skull: Normal. Negative for fracture or focal lesion. Sinuses/Orbits: The visualized paranasal sinuses are essentially clear. The mastoid air cells are unopacified. Other: None. CT CERVICAL SPINE FINDINGS Alignment: Normal cervical lordosis. Skull base and vertebrae: No acute fracture. No primary bone lesion or focal pathologic process. Soft tissues and spinal canal: No prevertebral fluid or swelling. No visible canal hematoma. Disc levels: Mild to moderate degenerative changes of the mid cervical spine. Spinal canal is patent. Upper chest: Visualized lung apices are clear. Other: Visualized thyroid is grossly unremarkable. IMPRESSION: Normal head CT. No evidence of traumatic injury to the cervical spine. Mild to moderate degenerative changes. Electronically Signed   By: Julian Hy M.D.   On: 09/04/2018 15:59   Mr Brain Wo Contrast  Result Date: 09/04/2018 CLINICAL DATA:  Initial evaluation for EXAM: MRI HEAD WITHOUT CONTRAST TECHNIQUE: Multiplanar, multiecho pulse sequences of the brain and surrounding structures were obtained without intravenous contrast. COMPARISON:  Prior CTA from earlier the same day as well as previous brain MRI from 05/10/2014. FINDINGS:  Brain: Cerebral volume within normal limits for patient age. Mild scattered subcentimeter T2/FLAIR hyperintensities noted within the periventricular, deep, and subcortical white matter both cerebral hemispheres, nonspecific, but most commonly related to chronic microvascular ischemic disease. No abnormal foci of restricted diffusion to suggest acute or subacute ischemia. Gray-white matter differentiation well maintained. No encephalomalacia to suggest chronic infarction.  No foci of susceptibility artifact to suggest acute or chronic intracranial hemorrhage. No mass lesion, midline shift or mass effect. No hydrocephalus. No extra-axial fluid collection. Major dural sinuses are grossly patent. Pituitary gland and suprasellar region are normal. Midline structures intact and normal. Vascular: Major intracranial vascular flow voids well maintained and normal in appearance. Skull and upper cervical spine: Craniocervical junction normal. Visualized upper cervical spine within normal limits. Bone marrow signal intensity normal. No scalp soft tissue abnormality. Sinuses/Orbits: Patient status post bilateral ocular lens replacement. Globes normal soft tissues demonstrate no acute finding. Paranasal sinuses are clear. No significant mastoid effusion. Inner ear structures normal. Other: None. IMPRESSION: 1. No acute intracranial abnormality. 2. Mild scattered T2/FLAIR hyperintensities within the supratentorial cerebral white matter, nonspecific, but most commonly related to chronic microvascular ischemic disease. Electronically Signed   By: Jeannine Boga M.D.   On: 09/04/2018 23:15    Procedures Procedures (including critical care time)  Medications Ordered in ED Medications  HYDROmorphone (DILAUDID) injection 0.5 mg (0.5 mg Intravenous Given 09/04/18 1517)  iohexol (OMNIPAQUE) 350 MG/ML injection 50 mL (50 mLs Intravenous Contrast Given 09/04/18 1718)  metoCLOPramide (REGLAN) injection 10 mg (10 mg Intravenous Given 09/04/18 2207)  diphenhydrAMINE (BENADRYL) injection 25 mg (25 mg Intravenous Given 09/04/18 2207)  ketorolac (TORADOL) 30 MG/ML injection 30 mg (30 mg Intravenous Given 09/04/18 2207)     Initial Impression / Assessment and Plan / ED Course  I have reviewed the triage vital signs and the nursing notes.  Pertinent labs & imaging results that were available during my care of the patient were reviewed by me and considered in my medical decision making (see chart  for details).  Clinical Course as of Sep 03 2341  Sat Sep 04, 2018  1700 Consult: Reviewed with Dr. Lorraine Lax neurology.  Recommend CTA head and neck and MRI brain.  If normal, likely migraine etiology.  Can follow-up on outpatient basis.  If positive findings call again for consult.   [MP]    Clinical Course User Index [MP] Charlesetta Shanks, MD      CT angiogram does not show any acute findings.  Aneurysm and vascular emergency are ruled out.  Patient does complain of headache.  We will proceed with migraine cocktail.  Awaiting MRI to rule out any possible ischemic changes.  Imaging is negative.  Patient's neurologic exam is intact.  At this time very high suspicion for migraine headaches.  Patient does have prior history of migraines.  She did describe this as being different.  At this time however aneurysm and stroke of been ruled out.  Patient is nontoxic and alert.  Final Clinical Impressions(s) / ED Diagnoses   Final diagnoses:  Bad headache  Vertigo    ED Discharge Orders    None       Charlesetta Shanks, MD 09/04/18 [phone]

## 2018-09-04 NOTE — Discharge Instructions (Addendum)
1.  Contact your doctor soon as possible to discuss pain control and the symptoms related to your migraines.

## 2018-09-05 ENCOUNTER — Encounter: Payer: Self-pay | Admitting: Family Medicine

## 2018-09-06 ENCOUNTER — Telehealth: Payer: 59

## 2018-09-07 ENCOUNTER — Other Ambulatory Visit: Payer: Self-pay | Admitting: Family Medicine

## 2018-09-07 MED FILL — ESCITALOPRAM 20 MG TABLET: 20 | 90 days supply | Qty: 90 | Fill #0

## 2018-09-07 MED FILL — hydrOXYzine HCL 25 MG TABS: 25 | 90 days supply | Qty: 180 | Fill #2

## 2018-09-08 DIAGNOSIS — Z9889 Other specified postprocedural states: Secondary | ICD-10-CM | POA: Diagnosis not present

## 2018-09-08 DIAGNOSIS — Z961 Presence of intraocular lens: Secondary | ICD-10-CM | POA: Diagnosis not present

## 2018-09-08 DIAGNOSIS — H0220C Unspecified lagophthalmos, bilateral, upper and lower eyelids: Secondary | ICD-10-CM | POA: Diagnosis not present

## 2018-09-08 DIAGNOSIS — H04123 Dry eye syndrome of bilateral lacrimal glands: Secondary | ICD-10-CM | POA: Diagnosis not present

## 2018-09-08 MED FILL — ERYTHROMYCIN EYE OINTMENT: 5 | 30 days supply | Qty: 4 | Fill #0

## 2018-09-13 ENCOUNTER — Ambulatory Visit (INDEPENDENT_AMBULATORY_CARE_PROVIDER_SITE_OTHER): Payer: 59 | Admitting: Family Medicine

## 2018-09-13 ENCOUNTER — Other Ambulatory Visit: Payer: Self-pay

## 2018-09-13 ENCOUNTER — Encounter (INDEPENDENT_AMBULATORY_CARE_PROVIDER_SITE_OTHER): Payer: Self-pay | Admitting: Family Medicine

## 2018-09-13 VITALS — BP 125/85 | HR 59 | Temp 97.4°F | Ht 61.0 in | Wt 175.0 lb

## 2018-09-13 DIAGNOSIS — E669 Obesity, unspecified: Secondary | ICD-10-CM

## 2018-09-13 DIAGNOSIS — Z6833 Body mass index (BMI) 33.0-33.9, adult: Secondary | ICD-10-CM | POA: Diagnosis not present

## 2018-09-13 DIAGNOSIS — Z9189 Other specified personal risk factors, not elsewhere classified: Secondary | ICD-10-CM

## 2018-09-13 DIAGNOSIS — E66811 Obesity, class 1: Secondary | ICD-10-CM

## 2018-09-13 DIAGNOSIS — E8881 Metabolic syndrome: Secondary | ICD-10-CM | POA: Diagnosis not present

## 2018-09-13 DIAGNOSIS — E88819 Insulin resistance, unspecified: Secondary | ICD-10-CM

## 2018-09-13 MED ORDER — METFORMIN HCL 500 MG PO TABS: 500.0000 mg | ORAL_TABLET | ORAL | 0 refills | Status: DC

## 2018-09-13 MED FILL — metFORMIN HCL 500 MG TABS: 500 | 30 days supply | Qty: 30 | Fill #0

## 2018-09-13 NOTE — Progress Notes (Signed)
Office: [phone]  /  Fax: [phone]   HPI:   Chief Complaint: OBESITY Denise Rice is here to discuss her progress with her obesity treatment plan. She is on the Category 1 plan and is following her eating plan approximately 98 % of the time. She states she is exercising 0 minutes 0 times per week. Denise Rice continues to do well to lose weight slowly on her Category 1 plan, but is struggling with a lot of in between meal hunger. She is eating everything on her plan.  Her weight is 175 lb (79.4 kg) today and has had a weight loss of 1 pound over a period of 2 weeks since her last visit. She has lost 0 lbs since starting treatment with [place].  Insulin Resistance Denise Rice has a diagnosis of insulin resistance based on her elevated fasting insulin level >5. Although Denise Rice's blood glucose readings are still under good control, insulin resistance puts her at greater risk of metabolic syndrome and diabetes. Denise Rice notes increased polyphagia which is worse in mid afternoon. She is not currently taking metformin and is doing well on her Category 1 plan and eating all of her food. She continues to work on diet and exercise to decrease risk of diabetes.  At risk for cardiovascular disease Denise Rice is at a higher than average risk for cardiovascular disease due to insulin resistance and obesity.   ASSESSMENT AND PLAN:  Insulin resistance - Plan: metFORMIN (GLUCOPHAGE) 500 MG tablet  At risk for heart disease  Class 1 obesity with serious comorbidity and body mass index (BMI) of 33.0 to 33.9 in adult, unspecified obesity type  PLAN:  Insulin Resistance Denise Rice will continue to work on weight loss, exercise, and decreasing simple carbohydrates in her diet to help decrease the risk of diabetes. She was informed that eating too many simple carbohydrates or too many calories at one sitting increases the likelihood of GI side effects. Denise Rice agreed to start metformin 500 mg qAM #30 with no refills and prescription was  written today. Denise Rice agreed to follow up with [place] as directed to monitor her progress in 2 to 3 weeks.  Cardiovascular risk counseling Denise Rice was given extended (15 minutes) coronary artery disease prevention counseling today. She is 68 y.o. female and has risk factors for heart disease including insulin resistance and obesity. We discussed intensive lifestyle modifications today with an emphasis on specific weight loss instructions and strategies. Pt was also informed of the importance of increasing exercise and decreasing saturated fats to help prevent heart disease.  Obesity Denise Rice is currently in the action stage of change. As such, her goal is to continue with weight loss efforts. She has agreed to follow the Category 1 plan. Denise Rice has been instructed to work up to a goal of 150 minutes of combined cardio and strengthening exercise per week for weight loss and overall health benefits. We discussed the following Behavioral Modification Strategies today: increasing lean protein intake, keeping healthy foods in the home, and family/coworker sabotage.  Denise Rice has agreed to follow up with our clinic in 2 to 3 weeks. She was informed of the importance of frequent follow up visits to maximize her success with intensive lifestyle modifications for her multiple health conditions.  ALLERGIES: Allergies  Allergen Reactions  . Topiramate Nausea Only    MEDICATIONS: Current Outpatient Medications on File Prior to Visit  Medication Sig Dispense Refill  . Biotin 5000 MCG CAPS Take 5,000 mcg by mouth daily.     . busPIRone (BUSPAR) 7.5 MG  tablet TAKE 1 TABLET BY MOUTH 3 TIMES DAILY AS NEEDED FOR ANXIETY (Patient taking differently: Take 7.5 mg by mouth 3 (three) times daily. For anxiety) 90 tablet 1  . cholecalciferol (VITAMIN D3) 25 MCG (1000 UT) tablet Take 1,000 Units by mouth daily.    . cyanocobalamin (,VITAMIN B-12,) 1000 MCG/ML injection Inject 1 mL (1,000 mcg total) into the muscle every 30  (thirty) days. 1 mL 12  . cyanocobalamin 2000 MCG tablet Take 2,000 mcg by mouth daily.    . diazepam (VALIUM) 10 MG tablet Take 20 mg by mouth daily as needed for anxiety.    Marland Kitchen escitalopram (LEXAPRO) 20 MG tablet TAKE 1 TABLET BY MOUTH DAILY. 90 tablet 3  . ferrous sulfate 325 (65 FE) MG tablet Take 325 mg by mouth daily with breakfast.    . Fremanezumab-vfrm (AJOVY) 225 MG/1.5ML SOSY Inject 225 mg into the skin every 30 (thirty) days. 1 Syringe 11  . Multiple Vitamin (MULTIVITAMIN WITH MINERALS) TABS tablet Take 1 tablet by mouth daily.    . Syringe/Needle, Disp, (SYRINGE 3CC/25GX1") 25G X 1" 3 ML MISC Use once monthly for injection of Viatmin B-12 50 each 0  . zolpidem (AMBIEN) 10 MG tablet Take 1 tablet (10 mg total) by mouth at bedtime as needed for sleep. 30 tablet 5   No current facility-administered medications on file prior to visit.     PAST MEDICAL HISTORY: Past Medical History:  Diagnosis Date  . Anal condyloma   . Anxiety   . Constipation   . Deafness in left ear   . Depression   . Fatigue   . Gallbladder problem   . Hearing loss, sensorineural, high frequency    RIGHT EAR  . 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 Last eruption 3/12  . History of peptic ulcer   . Infertility, female   . Lactose intolerance   . Otosclerosis of left ear   . Pancreatic disease   . PCO (polycystic ovaries)   . PCOS (polycystic ovarian syndrome) 03/11/2017  . PONV (postoperative nausea and vomiting)   . 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 Vertigo   . Vitamin B12 deficiency 07/29/2018   Started IM injections monthly 06/2018  . Vitamin D deficiency   . Wears hearing aid    both ears    PAST SURGICAL HISTORY: Past Surgical History:  Procedure Laterality Date  . BLEPHAROPLASTY  03-05-2000  . CRANIOTOMY  08/2014   to repair dehescence in right ear  . HAMMER TOE SURGERY Left 08-29-2013   2nd toe  . IMPLANTATION BONE  ANCHORED HEARING AID Left 07/27/2008   left temporal bone;now removed  . LAPAROSCOPIC CHOLECYSTECTOMY  03-31-2005  . LAPAROSCOPIC GASTRIC BANDING  02-08-2007  . LAPAROSCOPY N/A 11/17/2017   Procedure: LAPAROSCOPY LYSIS OF ADHESIONS FOR SMALL BOWEL OBSTRUCTION;  Surgeon: Excell Seltzer, MD;  Location: [facility];  Service: General;  Laterality: N/A;  . LASER ABLATION CONDOLAMATA N/A 07/30/2012   Procedure: LASER ABLATION CONDOLAMATA;  Surgeon: Leighton Ruff, MD;  Location: [facility];  Service: General;  Laterality: N/A;  . LASIK Bilateral 2002  . LYSIS OF ADHESION N/A 11/17/2017   Procedure: LYSIS OF ADHESION;  Surgeon: Excell Seltzer, MD;  Location: [facility];  Service: General;  Laterality: N/A;  . ROUX-EN-Y GASTRIC BYPASS  AUG 2013  . [place];  1994 x2;  02-03-1998  . STRABISMUS SURGERY Right   .  VAGINAL HYSTERECTOMY  2007   fibroids    SOCIAL HISTORY: Social History   Tobacco Use  . Smoking status: Never Smoker  . Smokeless tobacco: Never Used  Substance Use Topics  . Alcohol use: No    Alcohol/week: 0.0 standard drinks  . Drug use: No    FAMILY HISTORY: Family History  Problem Relation Age of Onset  . Heart attack Mother   . Heart disease Mother   . Diabetes Mother   . Hypertension Mother   . Hyperlipidemia Mother   . Stroke Mother   . Kidney disease Mother   . Thyroid disease Mother   . Anxiety disorder Mother   . Obesity Mother   . Cancer Father        unknown primary  . Diabetes Father   . Hypertension Father   . Hyperlipidemia Father   . Heart disease Father   . Stroke Father   . Kidney disease Father   . Obesity Father   . Stroke Sister   . Diabetes Sister   . Diabetes Maternal Aunt   . Heart disease Maternal Aunt   . Cancer Paternal Aunt   . Cancer Paternal Uncle   . Colon cancer Neg Hx   . Colon polyps Neg Hx     ROS: Review of Systems  Endo/Heme/Allergies:       Positive for polyphagia.    PHYSICAL EXAM:  Blood pressure 125/85, pulse (!) 59, temperature (!) 97.4 F (36.3 C), temperature source Oral, height 5\' 1"  (1.549 m), weight 175 lb (79.4 kg), last menstrual period 01/13/2005, SpO2 99 %. Body mass index is 33.07 kg/m. Physical Exam Vitals signs reviewed.  Constitutional:      Appearance: Normal appearance. She is obese.  HENT:     Head: Normocephalic and atraumatic.     Nose: Nose normal.  Eyes:     General: No scleral icterus.    Extraocular Movements: Extraocular movements intact.  Neck:     Musculoskeletal: Normal range of motion and neck supple.     Thyroid: No thyromegaly.     Comments: Negative for thyromegaly. Cardiovascular:     Rate and Rhythm: Normal rate and regular rhythm.  Pulmonary:     Effort: Pulmonary effort is normal. No respiratory distress.  Abdominal:     Palpations: Abdomen is soft.     Tenderness: There is no abdominal tenderness.     Comments: Positive for obesity.  Musculoskeletal:     Comments: ROM normal in all extremities.  Skin:    General: Skin is warm and dry.  Neurological:     Mental Status: She is alert and oriented to person, place, and time.     Coordination: Coordination normal.  Psychiatric:        Mood and Affect: Mood normal.        Behavior: Behavior normal.     RECENT LABS AND TESTS: BMET    Component Value Date/Time   NA 140 09/04/2018 1437   NA 140 08/03/2018 0853   K 4.1 09/04/2018 1437   CL 106 09/04/2018 1437   CO2 25 09/04/2018 1437   GLUCOSE 93 09/04/2018 1437   BUN 13 09/04/2018 1437   BUN 15 08/03/2018 0853   CREATININE 0.77 09/04/2018 1437   CREATININE 0.64 09/07/2015 1014   CALCIUM 8.3 (L) 09/04/2018 1437   GFRNONAA >60 09/04/2018 1437   GFRAA >60 09/04/2018 1437   Lab Results  Component Value Date   HGBA1C 5.6 08/03/2018   HGBA1C 5.3  02/26/2018   Lab Results  Component Value Date   INSULIN 3.7 08/03/2018   CBC    Component Value Date/Time   WBC 4.2 09/04/2018 1437   RBC 4.73 09/04/2018 1437    HGB 12.6 09/04/2018 1437   HGB 12.5 02/26/2018 1032   HCT 38.3 09/04/2018 1437   HCT 36.8 02/26/2018 1032   PLT 199 09/04/2018 1437   PLT 256 02/26/2018 1032   MCV 81.0 09/04/2018 1437   MCV 81 02/26/2018 1032   MCH 26.6 09/04/2018 1437   MCHC 32.9 09/04/2018 1437   RDW 13.3 09/04/2018 1437   RDW 14.3 02/26/2018 1032   LYMPHSABS 1.8 09/04/2018 1437   LYMPHSABS 1.6 02/26/2018 1032   MONOABS 0.3 09/04/2018 1437   EOSABS 0.1 09/04/2018 1437   EOSABS 0.1 02/26/2018 1032   BASOSABS 0.0 09/04/2018 1437   BASOSABS 0.0 02/26/2018 1032   Iron/TIBC/Ferritin/ %Sat    Component Value Date/Time   FERRITIN 13 06/16/2012 1053   Lipid Panel     Component Value Date/Time   CHOL 163 08/03/2018 0853   TRIG 107 08/03/2018 0853   HDL 75 08/03/2018 0853   CHOLHDL 2 06/23/2018 1151   VLDL 20.2 06/23/2018 1151   LDLCALC 67 08/03/2018 0853   Hepatic Function Panel     Component Value Date/Time   PROT 6.5 09/04/2018 1437   PROT 6.9 08/03/2018 0853   ALBUMIN 3.5 09/04/2018 1437   ALBUMIN 4.0 08/03/2018 0853   AST 52 (H) 09/04/2018 1437   ALT 43 09/04/2018 1437   ALKPHOS 67 09/04/2018 1437   BILITOT 0.4 09/04/2018 1437   BILITOT 0.3 08/03/2018 0853   BILIDIR 0.2 09/07/2015 1014   IBILI 0.3 09/07/2015 1014      Component Value Date/Time   TSH 4.660 (H) 08/03/2018 0853   TSH 1.47 06/23/2018 1151   TSH 2.210 07/02/2016 1042   Results for Tutton, Denise "GRACE" (MRN [id number]) as of 09/13/2018 15:23  Ref. Range 06/23/2018 11:51  VITD Latest Ref Range: 30.00 - 100.00 ng/mL 33.57     OBESITY BEHAVIORAL INTERVENTION VISIT  Today's visit was # 6   Starting weight: 178 lbs Starting date: 08/03/18 Today's weight : Weight: 175 lb (79.4 kg)  Today's date: 09/13/2018 Total lbs lost to date: 0    09/13/2018  Height 5\' 1"  (1.549 m)  Weight 175 lb (79.4 kg)  BMI (Calculated) 33.08  BLOOD PRESSURE - SYSTOLIC 124  BLOOD PRESSURE - DIASTOLIC 85   Body Fat % 58.0 %  Total Body Water (lbs)  73.8 lbs    ASK: We discussed the diagnosis of obesity with Denise Rice today and Denise Rice agreed to give [place] permission to discuss obesity behavioral modification therapy today.  ASSESS: Denise Rice has the diagnosis of obesity and her BMI today is 33.08. Denise Rice is in the action stage of change.   ADVISE: Denise Rice was educated on the multiple health risks of obesity as well as the benefit of weight loss to improve her health. She was advised of the need for long term treatment and the importance of lifestyle modifications to improve her current health and to decrease her risk of future health problems.  AGREE: Multiple dietary modification options and treatment options were discussed and Denise Rice agreed to follow the recommendations documented in the above note.  ARRANGE: Denise Rice was educated on the importance of frequent visits to treat obesity as outlined per CMS and USPSTF guidelines and agreed to schedule her next follow up appointment today.  Lenward Chancellor, CMA, am  acting as transcriptionist for Denise Rice Skeans, MD I have reviewed the above documentation for accuracy and completeness, and I agree with the above. -Dennard Nip, MD

## 2018-09-14 ENCOUNTER — Telehealth (INDEPENDENT_AMBULATORY_CARE_PROVIDER_SITE_OTHER): Payer: Self-pay | Admitting: Psychology

## 2018-09-14 ENCOUNTER — Ambulatory Visit (INDEPENDENT_AMBULATORY_CARE_PROVIDER_SITE_OTHER): Payer: Self-pay | Admitting: Psychology

## 2018-09-14 NOTE — Telephone Encounter (Signed)
  Office: [phone]  /  Fax: [phone]  Date of Call: September 14, 2018  Time of Call: 1:20pm Duration of Call: Less than 1 minute Provider: Glennie Isle, PsyD  CONTENT: This provider called Denise Rice e-mailed this provider requesting the Webex link for an appointment today at 2:00pm be re-sent. This provider clarified the next scheduled appointment is not until September 23, 2018, which will be at 4:00pm. Denise Rice requested this provider re-send the e-mail with the link. There was no evidence of suicidal and homicidal ideation, plan, and intent.   PLAN:  Denise Rice is scheduled for an appointment on September 23, 2018 at 4:00pm.

## 2018-09-15 ENCOUNTER — Other Ambulatory Visit: Payer: Self-pay | Admitting: *Deleted

## 2018-09-15 ENCOUNTER — Other Ambulatory Visit: Payer: Self-pay | Admitting: Family Medicine

## 2018-09-15 DIAGNOSIS — Z961 Presence of intraocular lens: Secondary | ICD-10-CM | POA: Diagnosis not present

## 2018-09-15 DIAGNOSIS — H0220C Unspecified lagophthalmos, bilateral, upper and lower eyelids: Secondary | ICD-10-CM | POA: Diagnosis not present

## 2018-09-15 DIAGNOSIS — H16223 Keratoconjunctivitis sicca, not specified as Sjogren's, bilateral: Secondary | ICD-10-CM | POA: Diagnosis not present

## 2018-09-15 MED FILL — BUSPIRONE HCL 7.5 MG TABS: 7.5 | 30 days supply | Qty: 90 | Fill #0

## 2018-09-15 MED FILL — ZOLPIDEM TARTRATE 10 MG TAB: 10 | 30 days supply | Qty: 30 | Fill #3

## 2018-09-15 MED FILL — DIAZEPAM 10 MG TABS: 10 | 30 days supply | Qty: 60 | Fill #3

## 2018-09-15 NOTE — Progress Notes (Signed)
Office: [phone]  /  Fax: [phone]    Date: September 23, 2018   Appointment Start Time: 4:08pm Duration: 32 minutes Provider: Glennie Isle, Psy.D. Type of Session: Individual Therapy  Location of Patient: Home Location of Provider: [facility] Type of Contact: Telepsychological Visit via [employer] Web[employer]   Session Content: Dacia is a 68 y.o. female presenting via [place] for a follow-up appointment to address the previously established treatment goal of decreasing emotional eating. Today's appointment was a telepsychological visit, as this provider's clinic is seeing a limited number of patients for in-person visits due to COVID-19. Therapeutic services will resume to in-person appointments once deemed appropriate. Allianna expressed understanding regarding the rationale for telepsychological services, and provided verbal consent for today's appointment. Prior to proceeding with today's appointment, Wladyslawa's physical location at the time of this appointment was obtained. Camay reported she was at home and provided the address. In the event of technical difficulties, Lyn shared a phone number she could be reached at. Anetta and this provider participated in today's telepsychological service. Also, Special denied anyone else being present in the room or on the WebEx appointment.  This provider conducted a brief check-in and verbally administered the PHQ-9 and GAD-7. Krisinda stated she started working out with a Clinical research associate. She provided an update regarding her recent fall and attributed it to vertigo. She added, "I'm fine." In addition, Hawley discussed feeling "discouraged" about her current weight loss. Thus, her eating habits were explored. She acknowledged she is not fully following her meal plan due to anxiety, but noted she is "trying to keep [herself] motivated." Based on the aforementioned, thought defusion was introduced. Psychoeducation regarding thought defusion, including  its impact on emotional eating and overall well-being was provided. Altheria was led through a thought defusion exercise, and a handout with various exercises was provided. Mariposa was encouraged to engage in the thought defusion exercises between now and the next appointment with this provider. [name] agreed. The following thought was used for today's exercise: "I am not enough." Her experience was processed. Laelani stated she initially felt "tense," but she indicated she was "easing it" as the exercise progressed. She also described feeling "calmer." [name] provided verbal consent during today's appointment for this provider to send a handout with thought defusion exercises via e-mail. Bayan was receptive to today's session as evidenced by openness to sharing, responsiveness to feedback, and willingness to engage in thought defusion exercises.  Mental Status Examination: Of note, today's appointment was initiated 8 minutes late due to this provider. Appearance: neat Behavior: cooperative Mood: euthymic Affect: mood congruent Speech: normal in rate, volume, and tone Eye Contact: appropriate Psychomotor Activity: appropriate Thought Process: linear, logical, and goal directed  Content/Perceptual Disturbances: no hallucinations, delusions, bizarre thinking or behavior reported or observed and no evidence of suicidal and homicidal ideation, plan, and intent Orientation: time, person, place and purpose of appointment Cognition/Sensorium: memory, attention, language, and fund of knowledge intact  Insight: good Judgment: good  Structured Assessment Results: The Patient Health Questionnaire-9 (PHQ-9) is a self-report measure that assesses symptoms and severity of depression over the course of the last two weeks. Cree obtained a score of 10 suggesting moderate depression. Madalena finds the endorsed symptoms to be very difficult. Little interest or pleasure in doing things 2  Feeling down, depressed, or hopeless 2   Trouble falling or staying asleep, or sleeping too much 2  Feeling tired or having little energy 1  Poor appetite or overeating 1  Feeling  bad about yourself --- or that you are a failure or have let yourself or your family down 2  Trouble concentrating on things, such as reading the newspaper or watching television 0  Moving or speaking so slowly that other people could have noticed? Or the opposite --- being so fidgety or restless that you have been moving around a lot more than usual 0  Thoughts that you would be better off dead or hurting yourself in some way 0  PHQ-9 Score 10    The Generalized Anxiety Disorder-7 (GAD-7) is a brief self-report measure that assesses symptoms of anxiety over the course of the last two weeks. Brandelyn obtained a score of 9 suggesting mild anxiety. Panzy finds the endorsed symptoms to be very difficult. Feeling nervous, anxious, on edge 2  Not being able to stop or control worrying 2  Worrying too much about different things 2  Trouble relaxing 1  Being so restless that it's hard to sit still 0  Becoming easily annoyed or irritable 0  Feeling afraid as if something awful might happen 2  GAD-7 Score 9   Interventions:  Conducted a brief chart review Verbal administration of PHQ-9 and GAD-7 for symptom monitoring Provided empathic reflections and validation Reviewed content from the previous session Psychoeducation provided regarding thought defusion Engaged patient in a thought defusion exercise Employed supportive psychotherapy interventions to facilitate reduced distress, and to improve coping skills with identified stressors Employed acceptance and commitment interventions to emphasize mindfulness and acceptance without struggle  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 Remee has demonstrated progress in her goal as evidenced by increased awareness of hunger patterns and triggers for emotional eating. She continues to demonstrate willingness to engage in learned skills.  Plan: Pablo continues to appear able and willing to participate as evidenced by engagement in reciprocal conversation, and asking questions for clarification as appropriate. Due to the recent increase in anxiety, the next appointment will be scheduled in approximately one week, which will be via [employer]. The next session will focus further on thought defusion. Notably, mindfulness was not introduced during today's appointment due to Ahilyn's presenting concerns.

## 2018-09-16 MED FILL — CYANOCOBALAMIN 1,000 MCG/ML: 1000 | 30 days supply | Qty: 1 | Fill #3

## 2018-09-16 MED FILL — AJOVY 225 MG/1.5ML SOSY: 225 | 30 days supply | Qty: 2 | Fill #8

## 2018-09-23 ENCOUNTER — Ambulatory Visit (INDEPENDENT_AMBULATORY_CARE_PROVIDER_SITE_OTHER): Payer: 59 | Admitting: Psychology

## 2018-09-23 DIAGNOSIS — F418 Other specified anxiety disorders: Secondary | ICD-10-CM

## 2018-09-23 NOTE — Progress Notes (Signed)
Office: [phone]  /  Fax: [phone]    Date: September 28, 2018   Appointment Start Time: 2:30pm Duration: 32 minutes Provider: Glennie Isle, Psy.D. Type of Session: Individual Therapy  Location of Patient: Home Location of Provider: [facility] Type of Contact: Telepsychological Visit via [employer] Web[employer]   Session Content: Laurissa is a 68 y.o. female presenting via [facility] for a follow-up appointment to address the previously established treatment goal of decreasing emotional eating. Today's appointment was a telepsychological visit, as this provider's clinic is seeing a limited number of patients for in-person visits due to COVID-19. Therapeutic services will resume to in-person appointments once deemed appropriate. Danyia expressed understanding regarding the rationale for telepsychological services, and provided verbal consent for today's appointment. Prior to proceeding with today's appointment, Guyla's physical location at the time of this appointment was obtained. Amberrose reported she was at home and provided the address. In the event of technical difficulties, Halen shared a phone number she could be reached at. Railyn and this provider participated in today's telepsychological service. Also, Danaisha denied anyone else being present in the room or on the WebEx appointment.  This provider conducted a brief check-in. Quentin stated she continues to go to the gym and shared about ongoing worry related to her cat's health. Kareemah added, "My anxiety is still up there. It's work." She provided further details about her ongoing work stress. Regarding thought defusion, Annalea acknowledged she did not engage in any exercises shared as she did not see the handout attached to the e-mail sent after the last appointment with this provider. The exercises were reviewed and she was encouraged to engage in them as needed. She shared she has been listening to hypnotic music "a lot" on [employer]  and described it as helpful. As such, this provider shared about [employer]. Moreover, Allycia was led through an additional thought defusion exercise, "Leaves on a Stream." Her experience after was processed. She noted, "That was a wonderful exercise." She was encouraged to engage in the exercise once a day due to ongoing stressors; she agreed. This provider sent Rosalin a MyChart message with a handout for today's exercise. Prior to sending the message, this provider explained the message would be visible to all providers, as it would be part of the electronic medical record. Cason verbally acknowledged understanding, and verbally consented to this provider sending the MyChart message. Furthermore, [name] shared she is "mostly sticking" to her meal plan. While she denied engagement in emotional eating, she noted an increase in hunger. As such, it was recommended she discuss this concern with Dr. Leafy Ro at their next appointment (October 05, 2018); [name] agreed. Taunja was receptive to today's session as evidenced by openness to sharing, responsiveness to feedback, and willingness to engage in today's thought defusion exercise.  Mental Status Examination:  Appearance: neat Behavior: cooperative Mood: euthymic Affect: mood congruent Speech: normal in rate, volume, and tone Eye Contact: appropriate Psychomotor Activity: appropriate Thought Process: linear, logical, and goal directed  Content/Perceptual Disturbances: no hallucinations, delusions, bizarre thinking or behavior reported or observed and no evidence of suicidal and homicidal ideation, plan, and intent Orientation: time, person, place and purpose of appointment Cognition/Sensorium: memory, attention, language, and fund of knowledge intact  Insight: good Judgment: good  Interventions:  Conducted a brief chart review Provided empathic reflections and validation Reviewed content from the previous session Engaged patient in a thought  defusion exercise Employed supportive psychotherapy interventions to facilitate reduced distress, and to improve coping  skills with identified stressors Employed acceptance and commitment interventions to emphasize mindfulness and acceptance without struggle  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 Gita has demonstrated progress in her goal as evidenced by increased awareness of hunger patterns and triggers for emotional eating. Loella also reported a reduction in emotional eating and continues to demonstrate willingness to engage in learned skill(s).  Plan: Celeste continues to appear able and willing to participate as evidenced by engagement in reciprocal conversation, and asking questions for clarification as appropriate. Since this provider will be out of the office in the coming weeks and Dalena noted she will be on vacation, the next appointment will be scheduled in three weeks, which will be via [employer]. The next session will focus on the introduction of mindfulness.

## 2018-09-27 ENCOUNTER — Encounter (INDEPENDENT_AMBULATORY_CARE_PROVIDER_SITE_OTHER): Payer: Self-pay | Admitting: Family Medicine

## 2018-09-28 ENCOUNTER — Other Ambulatory Visit: Payer: Self-pay

## 2018-09-28 ENCOUNTER — Encounter: Payer: Self-pay | Admitting: Family Medicine

## 2018-09-28 ENCOUNTER — Ambulatory Visit (INDEPENDENT_AMBULATORY_CARE_PROVIDER_SITE_OTHER): Payer: 59 | Admitting: Psychology

## 2018-09-28 ENCOUNTER — Encounter (INDEPENDENT_AMBULATORY_CARE_PROVIDER_SITE_OTHER): Payer: Self-pay

## 2018-09-28 DIAGNOSIS — F418 Other specified anxiety disorders: Secondary | ICD-10-CM | POA: Diagnosis not present

## 2018-10-01 ENCOUNTER — Other Ambulatory Visit: Payer: Self-pay

## 2018-10-01 ENCOUNTER — Ambulatory Visit (INDEPENDENT_AMBULATORY_CARE_PROVIDER_SITE_OTHER): Payer: 59

## 2018-10-01 ENCOUNTER — Ambulatory Visit (INDEPENDENT_AMBULATORY_CARE_PROVIDER_SITE_OTHER): Payer: 59 | Admitting: Obstetrics & Gynecology

## 2018-10-01 ENCOUNTER — Encounter: Payer: Self-pay | Admitting: Family Medicine

## 2018-10-01 ENCOUNTER — Ambulatory Visit: Payer: 59 | Admitting: Obstetrics & Gynecology

## 2018-10-01 ENCOUNTER — Encounter: Payer: Self-pay | Admitting: Obstetrics & Gynecology

## 2018-10-01 VITALS — BP 102/66 | HR 72 | Temp 98.0°F | Ht 60.5 in | Wt 178.6 lb

## 2018-10-01 DIAGNOSIS — Z01419 Encounter for gynecological examination (general) (routine) without abnormal findings: Secondary | ICD-10-CM | POA: Diagnosis not present

## 2018-10-01 DIAGNOSIS — Z23 Encounter for immunization: Secondary | ICD-10-CM

## 2018-10-01 NOTE — Progress Notes (Signed)
68 y.o. G0P0 Single White or Caucasian female here for annual exam.  Frustrated with weight.  Back working out with a Clinical research associate.  She must wear a mask.  Working out at [employer].  Seeing [employer] as well.  Trainer understands vertigo as well.  Denies vaginal bleeding.    Husband has been working on weight loss.  His kidney function continues to increase.  He is seeing provider at heart failure clinic every two weeks.  Seeing nephrologist regularly as well.    PCP:  Dr. Jonni Sanger  Patient's last menstrual period was 01/13/2005.          Sexually active: Yes.    The current method of family planning is status post hysterectomy.    Exercising: Yes.     Smoker:  no  Health Maintenance: Pap:  09/12/16 neg  History of abnormal Pap:  yes MMG:  10/21/17 BIRADS1:neg Colonoscopy:  10/12/15 f/u 10 years BMD:   Osteopenia 01/2018 TDaP:  2018 Pneumonia vaccine(s):  2020, 2016 Shingrix:   zostavax  Hep C testing: 09/07/15 neg Screening Labs: PCP   reports that she has never smoked. She has never used smokeless tobacco. She reports that she does not drink alcohol or use drugs.  Past Medical History:  Diagnosis Date  . Anal condyloma   . Anxiety   . Constipation   . Deafness in left ear   . Depression   . Fatigue   . Gallbladder problem   . Hearing loss, sensorineural, high frequency    RIGHT EAR  . 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 Last eruption 3/12  . History of peptic ulcer   . Infertility, female   . Lactose intolerance   . Otosclerosis of left ear   . Pancreatic disease   . PCO (polycystic ovaries)   . PCOS (polycystic ovarian syndrome) 03/11/2017  . PONV (postoperative nausea and vomiting)   . 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 Vertigo   . Vitamin B12 deficiency 07/29/2018   Started IM injections monthly 06/2018  . Vitamin D deficiency   . Wears hearing aid    both ears    Past Surgical  History:  Procedure Laterality Date  . BLEPHAROPLASTY  03-05-2000  . CRANIOTOMY  08/2014   to repair dehescence in right ear  . HAMMER TOE SURGERY Left 08-29-2013   2nd toe  . IMPLANTATION BONE ANCHORED HEARING AID Left 07/27/2008   left temporal bone;now removed  . LAPAROSCOPIC CHOLECYSTECTOMY  03-31-2005  . LAPAROSCOPIC GASTRIC BANDING  02-08-2007  . LAPAROSCOPY N/A 11/17/2017   Procedure: LAPAROSCOPY LYSIS OF ADHESIONS FOR SMALL BOWEL OBSTRUCTION;  Surgeon: Excell Seltzer, MD;  Location: [facility];  Service: General;  Laterality: N/A;  . LASER ABLATION CONDOLAMATA N/A 07/30/2012   Procedure: LASER ABLATION CONDOLAMATA;  Surgeon: Leighton Ruff, MD;  Location: [facility];  Service: General;  Laterality: N/A;  . LASIK Bilateral 2002  . LYSIS OF ADHESION N/A 11/17/2017   Procedure: LYSIS OF ADHESION;  Surgeon: Excell Seltzer, MD;  Location: [facility];  Service: General;  Laterality: N/A;  . ROUX-EN-Y GASTRIC BYPASS  AUG 2013  . [place];  1994 x2;  02-03-1998  . STRABISMUS SURGERY Right   . VAGINAL HYSTERECTOMY  2007   fibroids    Current Outpatient Medications  Medication Sig Dispense Refill  . Biotin 5000 MCG CAPS Take 5,000 mcg by mouth daily.     Marland Kitchen  busPIRone (BUSPAR) 7.5 MG tablet TAKE 1 TABLET BY MOUTH 3 TIMES DAILY AS NEEDED FOR ANXIETY 90 tablet 1  . cholecalciferol (VITAMIN D3) 25 MCG (1000 UT) tablet Take 1,000 Units by mouth daily.    . cyanocobalamin (,VITAMIN B-12,) 1000 MCG/ML injection Inject 1 mL (1,000 mcg total) into the muscle every 30 (thirty) days. 1 mL 12  . cyanocobalamin 2000 MCG tablet Take 2,000 mcg by mouth daily.    . diazepam (VALIUM) 10 MG tablet Take 20 mg by mouth daily as needed for anxiety.    Marland Kitchen escitalopram (LEXAPRO) 20 MG tablet TAKE 1 TABLET BY MOUTH DAILY. 90 tablet 3  . ferrous sulfate 325 (65 FE) MG tablet Take 325 mg by mouth daily with breakfast.    . Fremanezumab-vfrm (AJOVY) 225 MG/1.5ML SOSY Inject 225 mg into  the skin every 30 (thirty) days. 1 Syringe 11  . hydrOXYzine (ATARAX/VISTARIL) 25 MG tablet     . Multiple Vitamin (MULTIVITAMIN WITH MINERALS) TABS tablet Take 1 tablet by mouth daily.    . Syringe/Needle, Disp, (SYRINGE 3CC/25GX1") 25G X 1" 3 ML MISC Use once monthly for injection of Viatmin B-12 50 each 0  . zolpidem (AMBIEN) 10 MG tablet Take 1 tablet (10 mg total) by mouth at bedtime as needed for sleep. 30 tablet 5   No current facility-administered medications for this visit.     Family History  Problem Relation Age of Onset  . Heart attack Mother   . Heart disease Mother   . Diabetes Mother   . Hypertension Mother   . Hyperlipidemia Mother   . Stroke Mother   . Kidney disease Mother   . Thyroid disease Mother   . Anxiety disorder Mother   . Obesity Mother   . Cancer Father        unknown primary  . Diabetes Father   . Hypertension Father   . Hyperlipidemia Father   . Heart disease Father   . Stroke Father   . Kidney disease Father   . Obesity Father   . Stroke Sister   . Diabetes Sister   . Diabetes Maternal Aunt   . Heart disease Maternal Aunt   . Cancer Paternal Aunt   . Cancer Paternal Uncle   . Colon cancer Neg Hx   . Colon polyps Neg Hx     Review of Systems  All other systems reviewed and are negative.   Exam:   BP 102/66   Pulse 72   Temp 98 F (36.7 C) (Temporal)   Ht 5' 0.5" (1.537 m)   Wt 178 lb 9.6 oz (81 kg)   LMP 01/13/2005   BMI 34.31 kg/m    Height: 5' 0.5" (153.7 cm)  Ht Readings from Last 3 Encounters:  10/01/18 5' 0.5" (1.537 m)  09/13/18 5\' 1"  (1.549 m)  09/04/18 5\' 1"  (1.549 m)    General appearance: alert, cooperative and appears stated age Head: Normocephalic, without obvious abnormality, atraumatic Neck: no adenopathy, supple, symmetrical, trachea midline and thyroid normal to inspection and palpation Lungs: clear to auscultation bilaterally Breasts: normal appearance, no masses or tenderness Heart: regular rate and  rhythm Abdomen: soft, non-tender; bowel sounds normal; no masses,  no organomegaly Extremities: extremities normal, atraumatic, no cyanosis or edema Skin: Skin color, texture, turgor normal. No rashes or lesions Lymph nodes: Cervical, supraclavicular, and axillary nodes normal. No abnormal inguinal nodes palpated Neurologic: Grossly normal   Pelvic: External genitalia:  no lesions  Urethra:  normal appearing urethra with no masses, tenderness or lesions              Bartholins and Skenes: normal                 Vagina: normal appearing vagina with normal color and discharge, no lesions              Cervix: absent              Pap taken: No. Bimanual Exam:  Uterus:  uterus absent              Adnexa: no mass, fullness, tenderness               Rectovaginal: Confirms               Anus:  normal sphincter tone, no lesions  Chaperone was present for exam.  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  She is going pap smear not indicated Lab work done with Dr. Andy/Dr. Leafy Ro Colonoscopy and BMD is UTD return annually or prn

## 2018-10-05 ENCOUNTER — Ambulatory Visit (INDEPENDENT_AMBULATORY_CARE_PROVIDER_SITE_OTHER): Payer: 59 | Admitting: Family Medicine

## 2018-10-11 ENCOUNTER — Ambulatory Visit: Payer: 59 | Admitting: Obstetrics & Gynecology

## 2018-10-11 MED FILL — CYANOCOBALAMIN 1,000 MCG/ML: 1000 | 30 days supply | Qty: 1 | Fill #4

## 2018-10-11 MED FILL — BUSPIRONE HCL 7.5 MG TABS: 7.5 | 30 days supply | Qty: 90 | Fill #1

## 2018-10-12 MED FILL — AJOVY 225 MG/1.5ML SOSY: 225 | 30 days supply | Qty: 2 | Fill #9

## 2018-10-13 ENCOUNTER — Encounter (INDEPENDENT_AMBULATORY_CARE_PROVIDER_SITE_OTHER): Payer: Self-pay | Admitting: Physician Assistant

## 2018-10-13 ENCOUNTER — Ambulatory Visit (INDEPENDENT_AMBULATORY_CARE_PROVIDER_SITE_OTHER): Payer: 59 | Admitting: Physician Assistant

## 2018-10-13 ENCOUNTER — Other Ambulatory Visit: Payer: Self-pay

## 2018-10-13 VITALS — BP 130/74 | HR 72 | Temp 97.9°F | Ht 61.0 in | Wt 174.0 lb

## 2018-10-13 DIAGNOSIS — Z6833 Body mass index (BMI) 33.0-33.9, adult: Secondary | ICD-10-CM | POA: Diagnosis not present

## 2018-10-13 DIAGNOSIS — E8881 Metabolic syndrome: Secondary | ICD-10-CM

## 2018-10-13 DIAGNOSIS — E669 Obesity, unspecified: Secondary | ICD-10-CM

## 2018-10-13 MED FILL — ZOLPIDEM TARTRATE 10 MG TAB: 10 | 30 days supply | Qty: 30 | Fill #4

## 2018-10-13 MED FILL — DIAZEPAM 10 MG TABS: 10 | 30 days supply | Qty: 60 | Fill #4

## 2018-10-13 NOTE — Progress Notes (Signed)
Office: [phone]  /  Fax: [phone]    Date: October 19, 2018   Appointment Start Time: 10:31am Duration: 29 minutes Provider: Glennie Isle, Psy.D. Type of Session: Individual Therapy  Location of Patient: Home Location of Provider: [facility] Type of Contact: Telepsychological Visit via [employer] Web[employer]   Session Content: Denise Rice is a 68 y.o. female presenting via [place] for a follow-up appointment to address the previously established treatment goal of decreasing emotional eating. Today's appointment was a telepsychological visit, as it is an option for appointments to reduce exposure to COVID-19. Denise Rice expressed understanding regarding the rationale for telepsychological services, and provided verbal consent for today's appointment. Prior to proceeding with today's appointment, Denise Rice's physical location at the time of this appointment was obtained. Denise Rice reported she was at home and provided the address. In the event of technical difficulties, Denise Rice shared a phone number she could be reached at. Denise Rice and this provider participated in today's telepsychological service. Also, Denise Rice denied anyone else being present in the room or on the WebEx appointment.  This provider conducted a brief check-in and verbally administered the PHQ-9 and GAD-7. Denise Rice shared she met with Denise Rice, Denise Rice and she shared she enjoyed their recent appointment. During their appointment, Denise Rice was switched to category 2 for her structured meal plan and she is also "trying Metformin again." Additionally, Denise Rice shared she continues to practice the "Leaves on a Stream" exercise shared during a previous appointment. Moreover, Denise Rice reported a reduction in emotional eating and added, "I feel like something clicked in my head." Positive reinforcement was provided. Psychoeducation regarding mindfulness was provided to further assist with coping. A handout was provided to Denise Rice with further  information regarding mindfulness, including exercises. This provider also explained the benefit of mindfulness as it relates to emotional eating. Denise Rice was encouraged to engage in the provided exercises between now and the next appointment with this provider. Denise Rice agreed. She was led through an exercises involving her senses. Denise Rice provided verbal consent during today's appointment for this provider to send the handout about mindfulness via e-mail. Furthermore, termination planning was discussed as Denise Rice has demonstrated progress in her established treatment goal. Denise Rice expressed concern about the holidays approaching, including her not having her sister as she passed away. She was observed becoming tearful. This provider recommended longer-term therapeutic services due to the aforementioned and ongoing work stressors and discussed options to establish care with a new provider, including Denise Rice contacting her [facility] for a Psychiatrist, exploring [email or web address], or this provider placing a referral. Denise Rice provided verbal consent for this provider to place a referral to address ongoing stressors. Regarding termination of services with this provider, a follow-up will be scheduled in 3-4 weeks and an additional/termination appointment will be scheduled in 3-4 weeks after that. Overall, Denise Rice was receptive to today's session as evidenced by openness to sharing, responsiveness to feedback, and willingness to engage in mindfulness exercises.  Mental Status Examination:  Appearance: neat Behavior: cooperative Mood: euthymic Affect: mood congruent Speech: normal in rate, volume, and tone Eye Contact: appropriate Psychomotor Activity: appropriate Thought Process: linear, logical, and goal directed  Content/Perceptual Disturbances: no hallucinations, delusions, bizarre thinking or behavior reported or observed and no evidence of suicidal and homicidal ideation, plan, and  intent Orientation: time, person, place and purpose of appointment Cognition/Sensorium: memory, attention, language, and fund of knowledge intact  Insight: good Judgment: good  Structured Assessment Results: The Patient Health Questionnaire-9 (PHQ-9) is a self-report measure that  assesses symptoms and severity of depression over the course of the last two weeks. Rever obtained a score of 2 suggesting minimal depression. Denise Rice finds the endorsed symptoms to be somewhat difficult. Little interest or pleasure in doing things 1  Feeling down, depressed, or hopeless 1  Trouble falling or staying asleep, or sleeping too much 0  Feeling tired or having little energy 0  Poor appetite or overeating 0  Feeling bad about yourself --- or that you are a failure or have let yourself or your family down 0  Trouble concentrating on things, such as reading the newspaper or watching television 0  Moving or speaking so slowly that other people could have noticed? Or the opposite --- being so fidgety or restless that you have been moving around a lot more than usual 0  Thoughts that you would be better off dead or hurting yourself in some way 0  PHQ-9 Score 2    The Generalized Anxiety Disorder-7 (GAD-7) is a brief self-report measure that assesses symptoms of anxiety over the course of the last two weeks. Denise Rice obtained a score of 8 suggesting mild anxiety. Denise Rice finds the endorsed symptoms to be somewhat difficult. Feeling nervous, anxious, on edge 2  Not being able to stop or control worrying 2  Worrying too much about different things 2  Trouble relaxing 0  Being so restless that it's hard to sit still 0  Becoming easily annoyed or irritable 0  Feeling afraid as if something awful might happen- work related issues 2  GAD-7 Score 8   Interventions:  Conducted a brief chart review Verbal administration of PHQ-9 and GAD-7 for symptom monitoring Provided empathic reflections and validation Reviewed  content from the previous session Psychoeducation provided regarding mindfulness Engaged patient in a mindfulness exercise Discussed termination planning Discussed option for a referral for longer-term therapeutic services Provided positive reinforcement Employed supportive psychotherapy interventions to facilitate reduced distress, and to improve coping skills with identified stressors Employed acceptance and commitment interventions to emphasize mindfulness and acceptance without struggle  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 Louvina has demonstrated progress in her goal as evidenced by increased awareness of hunger patterns and triggers for emotional eating. Lavida also reported a reduction in emotional eating and continues to demonstrate willingness to engage in learned skill(s).  Plan: Guila continues to appear able and willing to participate as evidenced by engagement in reciprocal conversation, and asking questions for clarification as appropriate. The next appointment will be scheduled in three weeks, which will be via [employer]. The next session will focus further on mindfulness.

## 2018-10-13 NOTE — Progress Notes (Signed)
Office: [phone]  /  Fax: [phone]   HPI:   Chief Complaint: OBESITY Tarra is here to discuss her progress with her obesity treatment plan. She is on the Category 1 plan and is following her eating plan approximately 75 % of the time. She states she is exercising with a personal trainer for 60 minutes 3 times per week, and walking for 30-45 minutes 7 times per week. Ruie reports that she is following the plan very well, but is hungry throughout the day and she is snacking more. She is working from home but will be back to the office soon.  Her weight is 174 lb (78.9 kg) today and has had a weight loss of 1 pound over a period of 4 weeks since her last visit. She has lost 4 lbs since starting treatment with [place].  Insulin Resistance Kyaira has a diagnosis of insulin resistance based on her elevated fasting insulin level >5. Although Tyleah's blood glucose readings are still under good control, insulin resistance puts her at greater risk of metabolic syndrome and diabetes. She stopped taking metformin because she said it wasn't working. She continues to work on diet and exercise to decrease risk of diabetes.  ASSESSMENT AND PLAN:  Insulin resistance  Class 1 obesity with serious comorbidity and body mass index (BMI) of 33.0 to 33.9 in adult, unspecified obesity type  PLAN:  Insulin Resistance Marjan will continue to work on weight loss, exercise, and decreasing simple carbohydrates in her diet to help decrease the risk of diabetes. We dicussed metformin including benefits and risks. She was informed that eating too many simple carbohydrates or too many calories at one sitting increases the likelihood of GI side effects. Aranza agrees to restart metformin, and she agrees to follow up with our clinic in 2 weeks as directed to monitor her progress.  I spent > than 50% of the 25 minute visit on counseling as documented in the note.  Obesity Cordella is currently in the action stage of  change. As such, her goal is to continue with weight loss efforts She has agreed to change to follow the Category 2 plan Jaliah has been instructed to work up to a goal of 150 minutes of combined cardio and strengthening exercise per week for weight loss and overall health benefits. We discussed the following Behavioral Modification Strategies today: work on meal planning and easy cooking plans and keeping healthy foods in the home   Katurah has agreed to follow up with our clinic in 2 weeks. She was informed of the importance of frequent follow up visits to maximize her success with intensive lifestyle modifications for her multiple health conditions.  I spent > than 50% of the 25 minute visit on counseling as documented in the note.    ALLERGIES: Allergies  Allergen Reactions  . Topiramate Nausea Only    MEDICATIONS: Current Outpatient Medications on File Prior to Visit  Medication Sig Dispense Refill  . Biotin 5000 MCG CAPS Take 5,000 mcg by mouth daily.     . busPIRone (BUSPAR) 7.5 MG tablet TAKE 1 TABLET BY MOUTH 3 TIMES DAILY AS NEEDED FOR ANXIETY 90 tablet 1  . cholecalciferol (VITAMIN D3) 25 MCG (1000 UT) tablet Take 1,000 Units by mouth daily.    . cyanocobalamin (,VITAMIN B-12,) 1000 MCG/ML injection Inject 1 mL (1,000 mcg total) into the muscle every 30 (thirty) days. 1 mL 12  . cyanocobalamin 2000 MCG tablet Take 2,000 mcg by mouth daily.    Marland Kitchen  diazepam (VALIUM) 10 MG tablet Take 20 mg by mouth daily as needed for anxiety.    Marland Kitchen escitalopram (LEXAPRO) 20 MG tablet TAKE 1 TABLET BY MOUTH DAILY. 90 tablet 3  . ferrous sulfate 325 (65 FE) MG tablet Take 325 mg by mouth daily with breakfast.    . Fremanezumab-vfrm (AJOVY) 225 MG/1.5ML SOSY Inject 225 mg into the skin every 30 (thirty) days. 1 Syringe 11  . hydrOXYzine (ATARAX/VISTARIL) 25 MG tablet     . Multiple Vitamin (MULTIVITAMIN WITH MINERALS) TABS tablet Take 1 tablet by mouth daily.    . Syringe/Needle, Disp, (SYRINGE  3CC/25GX1") 25G X 1" 3 ML MISC Use once monthly for injection of Viatmin B-12 50 each 0  . zolpidem (AMBIEN) 10 MG tablet Take 1 tablet (10 mg total) by mouth at bedtime as needed for sleep. 30 tablet 5   No current facility-administered medications on file prior to visit.     PAST MEDICAL HISTORY: Past Medical History:  Diagnosis Date  . Anal condyloma   . Anxiety   . Constipation   . Deafness in left ear   . Depression   . Fatigue   . Gallbladder problem   . Hearing loss, sensorineural, high frequency    RIGHT EAR  . 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 Last eruption 3/12  . History of peptic ulcer   . Infertility, female   . Lactose intolerance   . Otosclerosis of left ear   . Pancreatic disease   . PCO (polycystic ovaries)   . PCOS (polycystic ovarian syndrome) 03/11/2017  . PONV (postoperative nausea and vomiting)   . 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 Vertigo   . Vitamin B12 deficiency 07/29/2018   Started IM injections monthly 06/2018  . Vitamin D deficiency   . Wears hearing aid    both ears    PAST SURGICAL HISTORY: Past Surgical History:  Procedure Laterality Date  . BLEPHAROPLASTY  03-05-2000  . CRANIOTOMY  08/2014   to repair dehescence in right ear  . HAMMER TOE SURGERY Left 08-29-2013   2nd toe  . IMPLANTATION BONE ANCHORED HEARING AID Left 07/27/2008   left temporal bone;now removed  . LAPAROSCOPIC CHOLECYSTECTOMY  03-31-2005  . LAPAROSCOPIC GASTRIC BANDING  02-08-2007  . LAPAROSCOPY N/A 11/17/2017   Procedure: LAPAROSCOPY LYSIS OF ADHESIONS FOR SMALL BOWEL OBSTRUCTION;  Surgeon: Excell Seltzer, MD;  Location: [facility];  Service: General;  Laterality: N/A;  . LASER ABLATION CONDOLAMATA N/A 07/30/2012   Procedure: LASER ABLATION CONDOLAMATA;  Surgeon: Leighton Ruff, MD;  Location: [facility];  Service: General;  Laterality: N/A;  . LASIK Bilateral 2002  . LYSIS OF  ADHESION N/A 11/17/2017   Procedure: LYSIS OF ADHESION;  Surgeon: Excell Seltzer, MD;  Location: [facility];  Service: General;  Laterality: N/A;  . ROUX-EN-Y GASTRIC BYPASS  AUG 2013  . [place];  1994 x2;  02-03-1998  . STRABISMUS SURGERY Right   . VAGINAL HYSTERECTOMY  2007   fibroids    SOCIAL HISTORY: Social History   Tobacco Use  . Smoking status: Never Smoker  . Smokeless tobacco: Never Used  Substance Use Topics  . Alcohol use: No    Alcohol/week: 0.0 standard drinks  . Drug use: No    FAMILY HISTORY: Family History  Problem Relation Age of Onset  . Heart attack Mother   . Heart disease Mother   .  Diabetes Mother   . Hypertension Mother   . Hyperlipidemia Mother   . Stroke Mother   . Kidney disease Mother   . Thyroid disease Mother   . Anxiety disorder Mother   . Obesity Mother   . Cancer Father        unknown primary  . Diabetes Father   . Hypertension Father   . Hyperlipidemia Father   . Heart disease Father   . Stroke Father   . Kidney disease Father   . Obesity Father   . Stroke Sister   . Diabetes Sister   . Diabetes Maternal Aunt   . Heart disease Maternal Aunt   . Cancer Paternal Aunt   . Cancer Paternal Uncle   . Colon cancer Neg Hx   . Colon polyps Neg Hx     ROS: Review of Systems  Constitutional: Positive for weight loss.    PHYSICAL EXAM: Blood pressure 130/74, pulse 72, temperature 97.9 F (36.6 C), temperature source Oral, height 5\' 1"  (1.549 m), weight 174 lb (78.9 kg), last menstrual period 01/13/2005, SpO2 98 %. Body mass index is 32.88 kg/m. Physical Exam Vitals signs reviewed.  Constitutional:      Appearance: Normal appearance. She is obese.  Cardiovascular:     Rate and Rhythm: Normal rate.     Pulses: Normal pulses.  Pulmonary:     Effort: Pulmonary effort is normal.     Breath sounds: Normal breath sounds.  Musculoskeletal: Normal range of motion.  Skin:    General: Skin is warm and dry.   Neurological:     Mental Status: She is alert and oriented to person, place, and time.  Psychiatric:        Mood and Affect: Mood normal.        Behavior: Behavior normal.     RECENT LABS AND TESTS: BMET    Component Value Date/Time   NA 140 09/04/2018 1437   NA 140 08/03/2018 0853   K 4.1 09/04/2018 1437   CL 106 09/04/2018 1437   CO2 25 09/04/2018 1437   GLUCOSE 93 09/04/2018 1437   BUN 13 09/04/2018 1437   BUN 15 08/03/2018 0853   CREATININE 0.77 09/04/2018 1437   CREATININE 0.64 09/07/2015 1014   CALCIUM 8.3 (L) 09/04/2018 1437   GFRNONAA >60 09/04/2018 1437   GFRAA >60 09/04/2018 1437   Lab Results  Component Value Date   HGBA1C 5.6 08/03/2018   HGBA1C 5.3 02/26/2018   Lab Results  Component Value Date   INSULIN 3.7 08/03/2018   CBC    Component Value Date/Time   WBC 4.2 09/04/2018 1437   RBC 4.73 09/04/2018 1437   HGB 12.6 09/04/2018 1437   HGB 12.5 02/26/2018 1032   HCT 38.3 09/04/2018 1437   HCT 36.8 02/26/2018 1032   PLT 199 09/04/2018 1437   PLT 256 02/26/2018 1032   MCV 81.0 09/04/2018 1437   MCV 81 02/26/2018 1032   MCH 26.6 09/04/2018 1437   MCHC 32.9 09/04/2018 1437   RDW 13.3 09/04/2018 1437   RDW 14.3 02/26/2018 1032   LYMPHSABS 1.8 09/04/2018 1437   LYMPHSABS 1.6 02/26/2018 1032   MONOABS 0.3 09/04/2018 1437   EOSABS 0.1 09/04/2018 1437   EOSABS 0.1 02/26/2018 1032   BASOSABS 0.0 09/04/2018 1437   BASOSABS 0.0 02/26/2018 1032   Iron/TIBC/Ferritin/ %Sat    Component Value Date/Time   FERRITIN 13 06/16/2012 1053   Lipid Panel     Component Value Date/Time   CHOL  163 08/03/2018 0853   TRIG 107 08/03/2018 0853   HDL 75 08/03/2018 0853   CHOLHDL 2 06/23/2018 1151   VLDL 20.2 06/23/2018 1151   LDLCALC 67 08/03/2018 0853   Hepatic Function Panel     Component Value Date/Time   PROT 6.5 09/04/2018 1437   PROT 6.9 08/03/2018 0853   ALBUMIN 3.5 09/04/2018 1437   ALBUMIN 4.0 08/03/2018 0853   AST 52 (H) 09/04/2018 1437   ALT 43  09/04/2018 1437   ALKPHOS 67 09/04/2018 1437   BILITOT 0.4 09/04/2018 1437   BILITOT 0.3 08/03/2018 0853   BILIDIR 0.2 09/07/2015 1014   IBILI 0.3 09/07/2015 1014      Component Value Date/Time   TSH 4.660 (H) 08/03/2018 0853   TSH 1.47 06/23/2018 1151   TSH 2.210 07/02/2016 1042      OBESITY BEHAVIORAL INTERVENTION VISIT  Today's visit was # 5   Starting weight: 178 lbs Starting date: 08/03/2018 Today's weight : 174 lbs  Today's date: 10/13/2018 Total lbs lost to date: 4    ASK: We discussed the diagnosis of obesity with [name] today and Koi agreed to give [place] permission to discuss obesity behavioral modification therapy today.  ASSESS: Kalkidan has the diagnosis of obesity and her BMI today is 32.89 Laquia is in the action stage of change   ADVISE: Jairy was educated on the multiple health risks of obesity as well as the benefit of weight loss to improve her health. She was advised of the need for long term treatment and the importance of lifestyle modifications to improve her current health and to decrease her risk of future health problems.  AGREE: Multiple dietary modification options and treatment options were discussed and  Ashea agreed to follow the recommendations documented in the above note.  ARRANGE: Lashon was educated on the importance of frequent visits to treat obesity as outlined per CMS and USPSTF guidelines and agreed to schedule her next follow up appointment today.  Wilhemena Durie, am acting as transcriptionist for Abby Potash, PA-C I, Abby Potash, PA-C have reviewed above note and agree with its content

## 2018-10-14 ENCOUNTER — Encounter (INDEPENDENT_AMBULATORY_CARE_PROVIDER_SITE_OTHER): Payer: Self-pay

## 2018-10-19 ENCOUNTER — Other Ambulatory Visit: Payer: Self-pay

## 2018-10-19 ENCOUNTER — Ambulatory Visit (INDEPENDENT_AMBULATORY_CARE_PROVIDER_SITE_OTHER): Payer: 59 | Admitting: Psychology

## 2018-10-19 DIAGNOSIS — F418 Other specified anxiety disorders: Secondary | ICD-10-CM

## 2018-10-27 ENCOUNTER — Ambulatory Visit (INDEPENDENT_AMBULATORY_CARE_PROVIDER_SITE_OTHER): Payer: 59 | Admitting: Physician Assistant

## 2018-10-27 ENCOUNTER — Other Ambulatory Visit: Payer: Self-pay

## 2018-10-27 ENCOUNTER — Encounter (INDEPENDENT_AMBULATORY_CARE_PROVIDER_SITE_OTHER): Payer: Self-pay | Admitting: Physician Assistant

## 2018-10-27 VITALS — BP 117/81 | HR 67 | Temp 97.9°F | Ht 61.0 in | Wt 173.0 lb

## 2018-10-27 DIAGNOSIS — Z6832 Body mass index (BMI) 32.0-32.9, adult: Secondary | ICD-10-CM

## 2018-10-27 DIAGNOSIS — E559 Vitamin D deficiency, unspecified: Secondary | ICD-10-CM

## 2018-10-27 DIAGNOSIS — E669 Obesity, unspecified: Secondary | ICD-10-CM

## 2018-10-27 NOTE — Progress Notes (Signed)
Office: [phone]  /  Fax: [phone]    Date: November 10, 2018   Appointment Start Time: 10:00am Duration: 30 minutes Provider: Glennie Isle, Psy.D. Type of Session: Individual Therapy  Location of Patient: Home Location of Provider: Provider's Home Type of Contact: Telepsychological Visit via [employer]   Session Content: Denise Rice is a 68 y.o. female presenting via [place] for a follow-up appointment to address the previously established treatment goal of decreasing emotional eating. Today's appointment was a telepsychological visit, as it is an option for appointments to reduce exposure to COVID-19. Denise Rice expressed understanding regarding the rationale for telepsychological services, and provided verbal consent for today's appointment. Prior to proceeding with today's appointment, Denise Rice's physical location at the time of this appointment was obtained. Denise Rice reported she was at home and provided the address. In the event of technical difficulties, Denise Rice shared a phone number she could be reached at. Denise Rice and this provider participated in today's telepsychological service. Also, Denise Rice denied anyone else being present in the room Denise on the WebEx appointment.  This provider conducted a brief check-in and verbally administered the PHQ-9 and GAD-7. Denise Rice reported she continues to regularly engage in the thought defusion exercise (Leaves on a Stream) previously shared. She indicated she is scheduled with a new provider on November 25, 2018 per the referral placed, but expressed a plan to post-pone the appointment until after the holidays. She shared she will call and schedule an appointment for January of 2021.  Regarding eating, Denise Rice stated she continues to have difficulty differentiating physical and emotional hunger. This was further explored. It was determined she is able to differentiate the two, but acknowledged occasionally engaging in emotional eating when experiencing stress.  Notably, Denise Rice stated she picks a snack congruent to the meal plan. Positive reinforcement was provided. This provider explored her engagement in coping skills during moments of stress. She continues to engage in learned skills to help cope. Denise Rice of session focused on mindfulness. Denise Rice acknowledged occasionally engaging in mindfulness exercises. She was led through a mindfulness exercise during today's appointment that focused on the breath as an anchor. Her experience after was processed. She noted, "I actually felt Denise Rice body de-tense." Denise Rice provided verbal consent during today's appointment for this provider to send the handout for today's exercise via e-mail. This provider also discussed the utilization of YouTube for mindfulness exercises (e.g., videos by Denise Rice). Furthermore, termination planning was discussed. She was receptive to termination based on her progress to date. As such, a follow-up appointment will be schedule in 3-4 weeks and an additional follow-up/termination appointment will be scheduled in 3-4 weeks after that. Denise Rice was receptive to today's session as evidenced by openness to sharing, responsiveness to feedback, and willingness to engage in mindfulness exercises.  Mental Status Examination:  Appearance: neat Behavior: cooperative Mood: euthymic Affect: mood congruent Speech: normal in rate, volume, and tone Eye Contact: appropriate Psychomotor Activity: appropriate Thought Process: linear, logical, and goal directed  Content/Perceptual Disturbances: no hallucinations, delusions, bizarre thinking Denise behavior reported Denise observed and no evidence of suicidal and homicidal ideation, plan, and intent Orientation: time, person, place and purpose of appointment Cognition/Sensorium: memory, attention, language, and fund of knowledge intact  Insight: good Judgment: good  Structured Assessment Results: The Patient Health Questionnaire-9 (PHQ-9) is a self-report measure that  assesses symptoms and severity of depression over the course of the last two weeks. Denise Rice obtained a score of 3 suggesting minimal depression. Denise Rice finds the endorsed symptoms to be somewhat difficult.  Denise Rice  Denise Rice, Denise Rice, Denise Rice  Trouble falling Denise staying asleep, Denise sleeping too much 0  Denise tired Denise having Denise energy Rice  Poor appetite Denise overeating 0  Denise bad about yourself --- Denise that you are a failure Denise have let yourself Denise your family Rice 0  Trouble concentrating on things, such as reading the newspaper Denise watching television 0  Moving Denise speaking so slowly that other people could have noticed? Denise the opposite --- being so fidgety Denise restless that you have been moving around a lot more than usual 0  Thoughts that you would be better off dead Denise hurting yourself in some way 0  PHQ-9 Score 3    The Generalized Anxiety Disorder-7 (GAD-7) is a brief self-report measure that assesses symptoms of anxiety over the course of the last two weeks. Denise Rice obtained a score of Rice suggesting minimal anxiety. Denise Rice finds the endorsed symptoms to be somewhat difficult. Denise nervous, anxious, on edge Rice  Not being able to stop Denise control worrying 0  Worrying too much about different things 0  Trouble relaxing 0  Being so restless that it's hard to sit still 0  Becoming easily annoyed Denise irritable 0  Denise afraid as if something awful might happen 0  GAD-7 Score Rice   Interventions:  Conducted a brief chart review Verbal administration of PHQ-9 and GAD-7 for symptom monitoring Provided empathic reflections and validation Reviewed content from the previous session Engaged patient in a mindfulness exercise Discussed termination planning Employed supportive psychotherapy interventions to facilitate reduced distress, and to improve coping skills with identified stressors Employed acceptance and commitment interventions to emphasize  mindfulness and acceptance without struggle  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 Myrian has demonstrated progress in her goal as evidenced by increased awareness of hunger patterns and triggers for emotional eating. Denise Rice also reported a reduction in emotional eating and continues to demonstrate willingness to engage in learned skill(s).  Plan: Denise Rice continues to appear able and willing to participate as evidenced by engagement in reciprocal conversation, and asking questions for clarification as appropriate. The next appointment will be scheduled in three weeks, which will be via [employer]. The next session will focus on reviewing learned skills, and working towards the established treatment goal.

## 2018-11-01 NOTE — Progress Notes (Signed)
Office: [phone]  /  Fax: [phone]   HPI:   Chief Complaint: OBESITY Denise Rice is here to discuss her progress with her obesity treatment plan. She is on the Category 2 plan and is following her eating plan approximately 98 % of the time. She states she is exercising 60 minutes 3 times per week. Denise Rice reports that the transition to the Category 2 plan went well. She is feeling better with more protein and she is less hungry throughout the day. Her weight is 173 lb (78.5 kg) today and has had a weight loss of 1 pound over a period of 2 weeks since her last visit. She has lost 5 lbs since starting treatment with [place].  Vitamin D deficiency Denise Rice has a diagnosis of vitamin D deficiency. Denise Rice is currently taking OTC vit D and she denies nausea, vomiting or muscle weakness.  ASSESSMENT AND PLAN:  Vitamin D deficiency  Class 1 obesity with serious comorbidity and body mass index (BMI) of 32.0 to 32.9 in adult, unspecified obesity type  PLAN:  Vitamin D Deficiency Denise Rice was informed that low vitamin D levels contributes to fatigue and are associated with obesity, breast, and colon cancer. Denise Rice will continue OTC vitamin D and she will follow up for routine testing of vitamin D, at least 2-3 times per year. She was informed of the risk of over-replacement of vitamin D and agrees to not increase her dose unless she discusses this with [place] first.  I spent > than 50% of the 15 minute visit on counseling as documented in the note.  Obesity Denise Rice is currently in the action stage of change. As such, her goal is to continue with weight loss efforts She has agreed to follow the Category 2 plan Denise Rice has been instructed to work up to a goal of 150 minutes of combined cardio and strengthening exercise per week for weight loss and overall health benefits. We discussed the following Behavioral Modification Strategies today: keeping healthy foods in the home and work on meal planning and easy cooking  plans  Denise Rice has agreed to follow up with our clinic in 2 weeks. She was informed of the importance of frequent follow up visits to maximize her success with intensive lifestyle modifications for her multiple health conditions.  ALLERGIES: Allergies  Allergen Reactions   Topiramate Nausea Only    MEDICATIONS: Current Outpatient Medications on File Prior to Visit  Medication Sig Dispense Refill   Biotin 5000 MCG CAPS Take 5,000 mcg by mouth daily.      busPIRone (BUSPAR) 7.5 MG tablet TAKE 1 TABLET BY MOUTH 3 TIMES DAILY AS NEEDED FOR ANXIETY 90 tablet 1   cholecalciferol (VITAMIN D3) 25 MCG (1000 UT) tablet Take 1,000 Units by mouth daily.     cyanocobalamin (,VITAMIN B-12,) 1000 MCG/ML injection Inject 1 mL (1,000 mcg total) into the muscle every 30 (thirty) days. 1 mL 12   cyanocobalamin 2000 MCG tablet Take 2,000 mcg by mouth daily.     diazepam (VALIUM) 10 MG tablet Take 20 mg by mouth daily as needed for anxiety.     escitalopram (LEXAPRO) 20 MG tablet TAKE 1 TABLET BY MOUTH DAILY. 90 tablet 3   ferrous sulfate 325 (65 FE) MG tablet Take 325 mg by mouth daily with breakfast.     Fremanezumab-vfrm (AJOVY) 225 MG/1.5ML SOSY Inject 225 mg into the skin every 30 (thirty) days. 1 Syringe 11   hydrOXYzine (ATARAX/VISTARIL) 25 MG tablet      Multiple Vitamin (MULTIVITAMIN  WITH MINERALS) TABS tablet Take 1 tablet by mouth daily.     Syringe/Needle, Disp, (SYRINGE 3CC/25GX1") 25G X 1" 3 ML MISC Use once monthly for injection of Viatmin B-12 50 each 0   zolpidem (AMBIEN) 10 MG tablet Take 1 tablet (10 mg total) by mouth at bedtime as needed for sleep. 30 tablet 5   No current facility-administered medications on file prior to visit.     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    Vertigo    Vitamin B12 deficiency 07/29/2018   Started IM injections monthly 06/2018   Vitamin D deficiency    Wears hearing aid    both ears    PAST SURGICAL HISTORY: Past Surgical History:  Procedure Laterality Date   BLEPHAROPLASTY  03-05-2000   CRANIOTOMY  08/2014   to repair dehescence in right ear   HAMMER TOE SURGERY Left 08-29-2013   2nd toe   IMPLANTATION BONE ANCHORED HEARING AID Left 07/27/2008   left temporal bone;now removed   LAPAROSCOPIC CHOLECYSTECTOMY  03-31-2005   LAPAROSCOPIC GASTRIC BANDING  02-08-2007   LAPAROSCOPY N/A 11/17/2017   Procedure: LAPAROSCOPY LYSIS OF ADHESIONS FOR SMALL BOWEL OBSTRUCTION;  Surgeon: Excell Seltzer, MD;  Location: [facility];  Service: General;  Laterality: N/A;   LASER ABLATION CONDOLAMATA N/A 07/30/2012   Procedure: LASER ABLATION CONDOLAMATA;  Surgeon: Leighton Ruff, MD;  Location: [facility];  Service: General;  Laterality: N/A;   LASIK Bilateral 2002   LYSIS OF ADHESION N/A 11/17/2017   Procedure: LYSIS OF ADHESION;  Surgeon: Excell Seltzer, MD;  Location: [facility];  Service: General;  Laterality: N/A;   ROUX-EN-Y GASTRIC BYPASS  AUG 2013   STAPEDES SURGERY Left 1985;  1994 x2;  02-03-1998   STRABISMUS SURGERY Right    VAGINAL HYSTERECTOMY  2007   fibroids    SOCIAL HISTORY: Social History   Tobacco Use   Smoking status: Never Smoker   Smokeless tobacco: Never Used  Substance Use Topics   Alcohol use: No    Alcohol/week: 0.0 standard drinks   Drug use: No     FAMILY HISTORY: Family History  Problem Relation Age of Onset   Heart attack Mother    Heart disease Mother    Diabetes Mother    Hypertension Mother    Hyperlipidemia Mother    Stroke Mother    Kidney disease Mother    Thyroid disease Mother    Anxiety disorder Mother    Obesity Mother    Cancer Father        unknown primary   Diabetes Father    Hypertension Father    Hyperlipidemia Father    Heart disease Father    Stroke Father    Kidney disease Father  Obesity Father    Stroke Sister    Diabetes Sister    Diabetes Maternal Aunt    Heart disease Maternal Aunt    Cancer Paternal Aunt    Cancer Paternal Uncle    Colon cancer Neg Hx    Colon polyps Neg Hx     ROS: Review of Systems  Constitutional: Positive for weight loss.  Gastrointestinal: Negative for nausea and vomiting.  Musculoskeletal:       Negative for muscle weakness    PHYSICAL EXAM: Blood pressure 117/81, pulse 67, temperature 97.9 F (36.6 C), temperature source Oral, height 5\' 1"  (1.549 m), weight 173 lb (78.5 kg), last menstrual period 01/13/2005, SpO2 99 %. Body mass index is 32.69 kg/m. Physical Exam Vitals signs reviewed.  Constitutional:      Appearance: Normal appearance. She is well-developed. She is obese.  Cardiovascular:     Rate and Rhythm: Normal rate.  Pulmonary:     Effort: Pulmonary effort is normal.  Musculoskeletal: Normal range of motion.  Skin:    General: Skin is warm and dry.  Neurological:     Mental Status: She is alert and oriented to person, place, and time.  Psychiatric:        Mood and Affect: Mood normal.        Behavior: Behavior normal.     RECENT LABS AND TESTS: BMET    Component Value Date/Time   NA 140 09/04/2018 1437   NA 140 08/03/2018 0853   K 4.1 09/04/2018 1437   CL 106 09/04/2018 1437   CO2 25 09/04/2018 1437   GLUCOSE 93 09/04/2018 1437   BUN 13 09/04/2018 1437   BUN 15 08/03/2018 0853   CREATININE 0.77  09/04/2018 1437   CREATININE 0.64 09/07/2015 1014   CALCIUM 8.3 (L) 09/04/2018 1437   GFRNONAA >60 09/04/2018 1437   GFRAA >60 09/04/2018 1437   Lab Results  Component Value Date   HGBA1C 5.6 08/03/2018   HGBA1C 5.3 02/26/2018   Lab Results  Component Value Date   INSULIN 3.7 08/03/2018   CBC    Component Value Date/Time   WBC 4.2 09/04/2018 1437   RBC 4.73 09/04/2018 1437   HGB 12.6 09/04/2018 1437   HGB 12.5 02/26/2018 1032   HCT 38.3 09/04/2018 1437   HCT 36.8 02/26/2018 1032   PLT 199 09/04/2018 1437   PLT 256 02/26/2018 1032   MCV 81.0 09/04/2018 1437   MCV 81 02/26/2018 1032   MCH 26.6 09/04/2018 1437   MCHC 32.9 09/04/2018 1437   RDW 13.3 09/04/2018 1437   RDW 14.3 02/26/2018 1032   LYMPHSABS 1.8 09/04/2018 1437   LYMPHSABS 1.6 02/26/2018 1032   MONOABS 0.3 09/04/2018 1437   EOSABS 0.1 09/04/2018 1437   EOSABS 0.1 02/26/2018 1032   BASOSABS 0.0 09/04/2018 1437   BASOSABS 0.0 02/26/2018 1032   Iron/TIBC/Ferritin/ %Sat    Component Value Date/Time   FERRITIN 13 06/16/2012 1053   Lipid Panel     Component Value Date/Time   CHOL 163 08/03/2018 0853   TRIG 107 08/03/2018 0853   HDL 75 08/03/2018 0853   CHOLHDL 2 06/23/2018 1151   VLDL 20.2 06/23/2018 1151   LDLCALC 67 08/03/2018 0853   Hepatic Function Panel     Component Value Date/Time   PROT 6.5 09/04/2018 1437   PROT 6.9 08/03/2018 0853   ALBUMIN 3.5 09/04/2018 1437   ALBUMIN 4.0 08/03/2018 0853   AST 52 (H) 09/04/2018 1437   ALT 43 09/04/2018 1437  ALKPHOS 67 09/04/2018 1437   BILITOT 0.4 09/04/2018 1437   BILITOT 0.3 08/03/2018 0853   BILIDIR 0.2 09/07/2015 1014   IBILI 0.3 09/07/2015 1014      Component Value Date/Time   TSH 4.660 (H) 08/03/2018 0853   TSH 1.47 06/23/2018 1151   TSH 2.210 07/02/2016 1042     Ref. Range 08/03/2018 08:53  Vitamin D, 25-Hydroxy Latest Ref Range: 30.0 - 100.0 ng/mL 28.2 (L)    OBESITY BEHAVIORAL INTERVENTION VISIT  Today's visit was # 6    Starting weight: 178 lbs Starting date: 08/03/2018 Today's weight : 173 lbs Today's date: 10/27/2018 Total lbs lost to date: 5    10/27/2018  Height 5\' 1"  (1.549 m)  Weight 173 lb (78.5 kg)  BMI (Calculated) 32.7  BLOOD PRESSURE - SYSTOLIC 932  BLOOD PRESSURE - DIASTOLIC 81   Body Fat % 48 %    ASK: We discussed the diagnosis of obesity with Denise Rice today and Denise Rice agreed to give [place] permission to discuss obesity behavioral modification therapy today.  ASSESS: Denise Rice has the diagnosis of obesity and her BMI today is 32.7 Denise Rice is in the action stage of change   ADVISE: Denise Rice was educated on the multiple health risks of obesity as well as the benefit of weight loss to improve her health. She was advised of the need for long term treatment and the importance of lifestyle modifications to improve her current health and to decrease her risk of future health problems.  AGREE: Multiple dietary modification options and treatment options were discussed and  Denise Rice agreed to follow the recommendations documented in the above note.  ARRANGE: Denise Rice was educated on the importance of frequent visits to treat obesity as outlined per CMS and USPSTF guidelines and agreed to schedule her next follow up appointment today.  Corey Skains, am acting as transcriptionist for Abby Potash, PA-C I, Abby Potash, PA-C have reviewed above note and agree with its content

## 2018-11-08 ENCOUNTER — Other Ambulatory Visit: Payer: Self-pay | Admitting: Family Medicine

## 2018-11-08 MED FILL — AJOVY 225 MG/1.5ML SOSY: 225 | 30 days supply | Qty: 2 | Fill #10

## 2018-11-08 MED FILL — BUSPIRONE HCL 7.5 MG TABS: 7.5 | 30 days supply | Qty: 90 | Fill #0

## 2018-11-08 MED FILL — CYANOCOBALAMIN 1,000 MCG/ML: 1000 | 30 days supply | Qty: 1 | Fill #5

## 2018-11-09 ENCOUNTER — Encounter: Payer: Self-pay | Admitting: Family Medicine

## 2018-11-09 ENCOUNTER — Ambulatory Visit (INDEPENDENT_AMBULATORY_CARE_PROVIDER_SITE_OTHER): Payer: 59 | Admitting: Family Medicine

## 2018-11-09 ENCOUNTER — Other Ambulatory Visit: Payer: Self-pay

## 2018-11-09 VITALS — BP 124/84 | HR 73 | Temp 97.8°F | Ht 61.0 in | Wt 177.2 lb

## 2018-11-09 DIAGNOSIS — G43709 Chronic migraine without aura, not intractable, without status migrainosus: Secondary | ICD-10-CM

## 2018-11-09 MED ORDER — TRAMADOL HCL 50 MG PO TABS: 50.0000 mg | ORAL_TABLET | Freq: Four times a day (QID) | ORAL | 0 refills | Status: DC | PRN

## 2018-11-09 MED FILL — traMADol HCL 50 MG TABS: 50 | 3 days supply | Qty: 15 | Fill #0

## 2018-11-09 NOTE — Patient Instructions (Signed)
Please continue Ajovy monthly  Try pretreating with extra strength tylenol a couple of days prior to week that migraines worsen. Use Tramadol sparingly as needed for abortive therapy  Follow up in 1 year, sooner if needed   Migraine Headache A migraine headache is a very strong throbbing pain on one side or both sides of your head. This type of headache can also cause other symptoms. It can last from 4 hours to 3 days. Talk with your doctor about what things may bring on (trigger) this condition. What are the causes? The exact cause of this condition is not known. This condition may be triggered or caused by:  Drinking alcohol.  Smoking.  Taking medicines, such as: ? Medicine used to treat chest pain (nitroglycerin). ? Birth control pills. ? Estrogen. ? Some blood pressure medicines.  Eating or drinking certain products.  Doing physical activity. Other things that may trigger a migraine headache include:  Having a menstrual period.  Pregnancy.  Hunger.  Stress.  Not getting enough sleep or getting too much sleep.  Weather changes.  Tiredness (fatigue). What increases the risk?  Being 60-64 years old.  Being female.  Having a family history of migraine headaches.  Being Caucasian.  Having depression or anxiety.  Being very overweight. What are the signs or symptoms?  A throbbing pain. This pain may: ? Happen in any area of the head, such as on one side or both sides. ? Make it hard to do daily activities. ? Get worse with physical activity. ? Get worse around bright lights or loud noises.  Other symptoms may include: ? Feeling sick to your stomach (nauseous). ? Vomiting. ? Dizziness. ? Being sensitive to bright lights, loud noises, or smells.  Before you get a migraine headache, you may get warning signs (an aura). An aura may include: ? Seeing flashing lights or having blind spots. ? Seeing bright spots, halos, or zigzag lines. ? Having tunnel  vision or blurred vision. ? Having numbness or a tingling feeling. ? Having trouble talking. ? Having weak muscles.  Some people have symptoms after a migraine headache (postdromal phase), such as: ? Tiredness. ? Trouble thinking (concentrating). How is this treated?  Taking medicines that: ? Relieve pain. ? Relieve the feeling of being sick to your stomach. ? Prevent migraine headaches.  Treatment may also include: ? Having acupuncture. ? Avoiding foods that bring on migraine headaches. ? Learning ways to control your body functions (biofeedback). ? Therapy to help you know and deal with negative thoughts (cognitive behavioral therapy). Follow these instructions at home: Medicines  Take over-the-counter and prescription medicines only as told by your doctor.  Ask your doctor if the medicine prescribed to you: ? Requires you to avoid driving or using heavy machinery. ? Can cause trouble pooping (constipation). You may need to take these steps to prevent or treat trouble pooping:  Drink enough fluid to keep your pee (urine) pale yellow.  Take over-the-counter or prescription medicines.  Eat foods that are high in fiber. These include beans, whole grains, and fresh fruits and vegetables.  Limit foods that are high in fat and sugar. These include fried or sweet foods. Lifestyle  Do not drink alcohol.  Do not use any products that contain nicotine or tobacco, such as cigarettes, e-cigarettes, and chewing tobacco. If you need help quitting, ask your doctor.  Get at least 8 hours of sleep every night.  Limit and deal with stress. General instructions  Keep a journal to find out what may bring on your migraine headaches. For example, write down: ? What you eat and drink. ? How much sleep you get. ? Any change in what you eat or drink. ? Any change in your medicines.  If you have a migraine headache: ? Avoid things that make your symptoms worse, such as bright  lights. ? It may help to lie down in a dark, quiet room. ? Do not drive or use heavy machinery. ? Ask your doctor what activities are safe for you.  Keep all follow-up visits as told by your doctor. This is important. Contact a doctor if:  You get a migraine headache that is different or worse than others you have had.  You have more than 15 headache days in one month. Get help right away if:  Your migraine headache gets very bad.  Your migraine headache lasts longer than 72 hours.  You have a fever.  You have a stiff neck.  You have trouble seeing.  Your muscles feel weak or like you cannot control them.  You start to lose your balance a lot.  You start to have trouble walking.  You pass out (faint).  You have a seizure. Summary  A migraine headache is a very strong throbbing pain on one side or both sides of your head. These headaches can also cause other symptoms.  This condition may be treated with medicines and changes to your lifestyle.  Keep a journal to find out what may bring on your migraine headaches.  Contact a doctor if you get a migraine headache that is different or worse than others you have had.  Contact your doctor if you have more than 15 headache days in a month. This information is not intended to replace advice given to you by your health care provider. Make sure you discuss any questions you have with your health care provider. Document Released: 10/09/2007 Document Revised: 04/23/2018 Document Reviewed: 02/11/2018 Elsevier Patient Education  2020 [employer].

## 2018-11-09 NOTE — Progress Notes (Addendum)
PATIENT: Denise Rice DOB: August 20, 1950  REASON FOR VISIT: follow up HISTORY FROM: patient  Chief Complaint  Patient presents with   Follow-up    room 1, Ajovy works well until a few days before the next dose.      HISTORY OF PRESENT ILLNESS: Today 11/09/18 Denise Rice is a 68 y.o. female here today for follow up for migraines. She reports that headaches are stable on Ajovy monthly. She has really enjoyed this medication. Her only concern is that headaches seem to return the week prior to her next dose. She usually takes Tylenol for abortive therapy but states that sometimes this is not effective. She was previously taking tramadol for migraines unresponsive to Tylenol. There was previous concern of seizure activity and triptan therapy has been avoided. She is doing very well today and without concerns.  HISTORY: (copied from Dr Cathren Laine note on 07/02/2016)  Interval update 07/02/2016: Denise Rice is a lovely 68 y.o. female here as a referral from Dr. 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 She was last seen in January of this year with headaches that usually occur 2-3 times a week. Headaches occur on the right side. She noted some scalp sensitivity especially with brushing her hair. She reported nausea but no vomiting. Also blurry vision with her headaches. She has photophobia. Dizziness. Since then the headaches have resolved.   She is here with a new issue. She fell out of bed and woke up on the floor, she didn't even know she had fell out of bed. She was in such pain it took her 20 minutes to get of fof the bed, this happened Monday early morning. She is still in pain, she is having trouble walking, bending, getting up and sitting down. She denies any head trauma or known bruising. She hurts badly from her buttocks down her legs and in her neck. A lot of stiffness. She feels like she ran a marathon. She went to bed at 10pm and  woke up at 2am so maximum 4 hours. Her bedsheets were on the bed. She is not mentally impaired, just physically she feels like he is 18. She is using a cane. No one particular spot just all over musculoskeletal pain. Lots of stress. She takes [place] and xanax at night but she has been doing this for years nothing new, no alcohol. Advil helps with pain. She is going to get a massage.   Interval Update 12/18/2014:Denise Rice is a lovely 68 y.o. female here as a referral from Dr. 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 She had her surgery. After about 8 weeks her hearing improved and her vertigo improved. She is in rehab for her vertigo. She is hearing better. She tires very easily. No falls. She still has dizziness but nbithing significant.  HPI: Denise Rice is a lovely 68 y.o. female here as a referral from Dr. 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 She has had multiple falls, most recently in her kitchen. She does not remember falling. She was facing the fridge and truned around and next thing she remembers she was on the ground. She did not proceed to the ED. She had a split lip and there was blood everywhere. Her nose is hurting on the bridge but reports no further alteration of consciousness or confusion. She has been having headaches since hitting her  head but they are improving. No irritability, no decreased concentration, no increase in dizziness, no further memory changes or other post-concussive symptma. The headaches feel dull throbbing. She is better, hasn;t had one in a few days. This is the first time she has fallen and has no recollection of the fall. Discussed at length, it appears as though she may have hit her head pretty hard on the floor and possibly cause some memory loss. I did encourage her to go to the emergency room next time for evaluation of intracranial hematomas as this can  be a serious complication of falls.   Patient is scheduled for repair of the semicircular canals. She has been told she may be dizzier afterwards but over the long term her surgeon is hopeful it will help. She had her first surgery in 1970. She is using a cane. She feels good today. Yesterday she also felt well. Walking makes her feel worse. The Xanax 1mg  twice a day helps a lot for the disequilibrium. The scopolamine patch helped but was too expensive. She will be admitted on July 22nd to [facility]. She takes [place] every night. Discussed that at this time it's important for her to concentrate on her surgery and get better. After her surgery and recovery, we can discuss the Xanax and Ambien in the evenings and see if we can decrease that. If she has any more episodes of falls with loss of consciousness. May need a cardiac evaluation for abnormal arrhythmias  REVIEW OF SYSTEMS: Out of a complete 14 system review of symptoms, the patient complains only of the following symptoms, headaches and all other reviewed systems are negative.  ALLERGIES: Allergies  Allergen Reactions   Topiramate Nausea Only    HOME MEDICATIONS: Outpatient Medications Prior to Visit  Medication Sig Dispense Refill   Biotin 5000 MCG CAPS Take 5,000 mcg by mouth daily.      busPIRone (BUSPAR) 7.5 MG tablet TAKE 1 TABLET BY MOUTH THREE TIMES DAILY AS NEEDED FOR ANXIETY 90 tablet 1   cholecalciferol (VITAMIN D3) 25 MCG (1000 UT) tablet Take 1,000 Units by mouth daily.     cyanocobalamin (,VITAMIN B-12,) 1000 MCG/ML injection Inject 1 mL (1,000 mcg total) into the muscle every 30 (thirty) days. 1 mL 12   cyanocobalamin 2000 MCG tablet Take 2,000 mcg by mouth daily.     diazepam (VALIUM) 10 MG tablet Take 20 mg by mouth daily as needed for anxiety.     ferrous sulfate 325 (65 FE) MG tablet Take 325 mg by mouth daily with breakfast.     Fremanezumab-vfrm (AJOVY) 225 MG/1.5ML SOSY Inject 225 mg into the skin every  30 (thirty) days. 1 Syringe 11   hydrOXYzine (ATARAX/VISTARIL) 25 MG tablet      Multiple Vitamin (MULTIVITAMIN WITH MINERALS) TABS tablet Take 1 tablet by mouth daily.     Syringe/Needle, Disp, (SYRINGE 3CC/25GX1") 25G X 1" 3 ML MISC Use once monthly for injection of Viatmin B-12 50 each 0   zolpidem (AMBIEN) 10 MG tablet Take 1 tablet (10 mg total) by mouth at bedtime as needed for sleep. 30 tablet 5   escitalopram (LEXAPRO) 20 MG tablet TAKE 1 TABLET BY MOUTH DAILY. 90 tablet 3   No facility-administered medications prior to visit.     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    Vertigo    Vitamin B12 deficiency 07/29/2018   Started IM injections monthly 06/2018   Vitamin D deficiency    Wears hearing aid    both ears    PAST SURGICAL HISTORY: Past Surgical History:  Procedure Laterality Date   BLEPHAROPLASTY  03-05-2000   CRANIOTOMY  08/2014   to repair dehescence in right ear   HAMMER TOE SURGERY Left 08-29-2013   2nd toe   IMPLANTATION BONE ANCHORED HEARING AID Left 07/27/2008   left temporal bone;now removed   LAPAROSCOPIC CHOLECYSTECTOMY  03-31-2005   LAPAROSCOPIC GASTRIC BANDING  02-08-2007   LAPAROSCOPY N/A 11/17/2017   Procedure: LAPAROSCOPY LYSIS OF ADHESIONS FOR SMALL BOWEL OBSTRUCTION;  Surgeon: Excell Seltzer, MD;  Location:  [facility];  Service: General;  Laterality: N/A;   [place] N/A 07/30/2012   Procedure: LASER ABLATION CONDOLAMATA;  Surgeon: Leighton Ruff, MD;  Location: [facility];  Service: General;  Laterality: N/A;   LASIK Bilateral 2002   LYSIS OF ADHESION N/A 11/17/2017   Procedure: LYSIS OF ADHESION;  Surgeon: Excell Seltzer, MD;  Location: [facility];  Service: General;  Laterality: N/A;   ROUX-EN-Y GASTRIC BYPASS  AUG 2013   [employer];  1994 x2;  02-03-1998   STRABISMUS SURGERY Right    VAGINAL HYSTERECTOMY  2007   fibroids    FAMILY HISTORY: Family History  Problem Relation Age of Onset   Heart attack Mother    Heart disease Mother    Diabetes Mother    Hypertension Mother    Hyperlipidemia Mother    Stroke Mother    Kidney disease Mother    Thyroid disease Mother    Anxiety disorder Mother    Obesity Mother    Cancer Father        unknown primary   Diabetes Father    Hypertension Father    Hyperlipidemia Father    Heart disease Father    Stroke Father    Kidney disease Father    Obesity Father    Stroke Sister    Diabetes Sister    Diabetes Maternal Aunt    Heart disease Maternal Aunt    Cancer Paternal Aunt    Cancer Paternal Uncle    Colon cancer Neg Hx    Colon polyps Neg Hx     SOCIAL HISTORY: Social History   Socioeconomic History   Marital status: Single    Spouse name: Denise Rice    Number of children: 1   Years of education: MBA   Highest education level: Not on file  Occupational History   Occupation: Sports coach: [employer]    Comment: [employer]  Social Designer, fashion/clothing strain: Not on file   Food insecurity    Worry: Not on file    Inability: Not on file   Transportation needs    Medical: Not on file  Non-medical: Not on file  Tobacco Use   Smoking status: Never Smoker   Smokeless tobacco: Never Used  Substance and  Sexual Activity   Alcohol use: No    Alcohol/week: 0.0 standard drinks   Drug use: No   Sexual activity: Yes    Birth control/protection: Surgical  Lifestyle   Physical activity    Days per week: Not on file    Minutes per session: Not on file   Stress: Not on file  Relationships   Social connections    Talks on phone: Not on file    Gets together: Not on file    Attends religious service: Not on file    Active member of club or organization: Not on file    Attends meetings of clubs or organizations: Not on file    Relationship status: Not on file   Intimate partner violence    Fear of current or ex partner: Not on file    Emotionally abused: Not on file    Physically abused: Not on file    Forced sexual activity: Not on file  Other Topics Concern   Not on file  Social History Narrative   Lives at home with husband and daughter.   Left handed.   Caffeine use: 2-3 cups (coffee) per day       PHYSICAL EXAM  Vitals:   11/09/18 1526  BP: 124/84  Pulse: 73  Temp: 97.8 F (36.6 C)  Weight: 177 lb 3.2 oz (80.4 kg)  Height: 5\' 1"  (1.549 m)   Body mass index is 33.48 kg/m.  Generalized: Well developed, in no acute distress  Cardiology: normal rate and rhythm, no murmur noted Neurological examination  Mentation: Alert oriented to time, place, history taking. Follows all commands speech and language fluent Cranial nerve II-XII: Pupils were equal round reactive to light. Extraocular movements were full, visual field were full on confrontational test. Facial sensation and strength were normal. Uvula tongue midline. Head turning and shoulder shrug  were normal and symmetric. Motor: The motor testing reveals 5 over 5 strength of all 4 extremities. Good symmetric motor tone is noted throughout.  Sensory: Sensory testing is intact to soft touch on all 4 extremities. No evidence of extinction is noted.  Coordination: Cerebellar testing reveals good finger-nose-finger and  heel-to-shin bilaterally.  Gait and station: Gait is normal.   DIAGNOSTIC DATA (LABS, IMAGING, TESTING) - I reviewed patient records, labs, notes, testing and imaging myself where available.  No flowsheet data found.   Lab Results  Component Value Date   WBC 4.2 09/04/2018   HGB 12.6 09/04/2018   HCT 38.3 09/04/2018   MCV 81.0 09/04/2018   PLT 199 09/04/2018      Component Value Date/Time   NA 140 09/04/2018 1437   NA 140 08/03/2018 0853   K 4.1 09/04/2018 1437   CL 106 09/04/2018 1437   CO2 25 09/04/2018 1437   GLUCOSE 93 09/04/2018 1437   BUN 13 09/04/2018 1437   BUN 15 08/03/2018 0853   CREATININE 0.77 09/04/2018 1437   CREATININE 0.64 09/07/2015 1014   CALCIUM 8.3 (L) 09/04/2018 1437   PROT 6.5 09/04/2018 1437   PROT 6.9 08/03/2018 0853   ALBUMIN 3.5 09/04/2018 1437   ALBUMIN 4.0 08/03/2018 0853   AST 52 (H) 09/04/2018 1437   ALT 43 09/04/2018 1437   ALKPHOS 67 09/04/2018 1437   BILITOT 0.4 09/04/2018 1437   BILITOT 0.3 08/03/2018 0853   GFRNONAA >60 09/04/2018 1437  GFRAA >60 09/04/2018 1437   Lab Results  Component Value Date   CHOL 163 08/03/2018   HDL 75 08/03/2018   LDLCALC 67 08/03/2018   TRIG 107 08/03/2018   CHOLHDL 2 06/23/2018   Lab Results  Component Value Date   HGBA1C 5.6 08/03/2018   Lab Results  Component Value Date   DBZMCEYE23 361 08/03/2018   Lab Results  Component Value Date   TSH 4.660 (H) 08/03/2018     ASSESSMENT AND PLAN 68 y.o. 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 here with     ICD-10-CM   1. Chronic migraine without aura without status migrainosus, not intractable  G43.709     Darria continues to do very well with Ajovy. We will continue current therapy. She does note breakthrough migraines the week prior to her next dose. We have discussed adding a new preventative medication. She is hesitant about adding another daily medication at this time. She request abortive therapy. She has taken tramadol in the past with significant relief. [place] database reviewed. We have discussed pretreating with extra strength Tylenol a couple of days prior to worsening of migraines. She will use tramadol sparingly for migraines unresponsive to Tylenol. She will follow-up with me in 1 year, sooner if needed. She verbalizes understanding and agreement with this plan.   No orders of the defined types were placed in this encounter.    Meds ordered this encounter  Medications   traMADol (ULTRAM) 50 MG tablet    Sig: Take 1 tablet (50 mg total) by mouth every 6 (six) hours as needed.    Dispense:  15 tablet    Refill:  0    Order Specific Question:   Supervising Provider    Answer:   Melvenia Beam [id number]      I spent 20 minutes with the patient. 50% of this time was spent counseling and educating patient on plan of care and medications.    Debbora Presto, FNP-C 11/09/2018, 4:19 PM [facility] [address], [place], [place] [address] [phone]  Made any corrections needed, and agree with history, physical, neuro exam,assessment and plan as stated.     Sarina Ill, MD [facility]

## 2018-11-10 ENCOUNTER — Ambulatory Visit (INDEPENDENT_AMBULATORY_CARE_PROVIDER_SITE_OTHER): Payer: 59 | Admitting: Psychology

## 2018-11-10 ENCOUNTER — Other Ambulatory Visit: Payer: Self-pay

## 2018-11-10 DIAGNOSIS — F418 Other specified anxiety disorders: Secondary | ICD-10-CM | POA: Diagnosis not present

## 2018-11-10 MED FILL — ZOLPIDEM TARTRATE 10 MG TAB: 10 | 30 days supply | Qty: 30 | Fill #5

## 2018-11-10 MED FILL — DIAZEPAM 10 MG TABS: 10 | 30 days supply | Qty: 60 | Fill #5

## 2018-11-17 ENCOUNTER — Ambulatory Visit (INDEPENDENT_AMBULATORY_CARE_PROVIDER_SITE_OTHER): Payer: 59 | Admitting: Physician Assistant

## 2018-11-18 NOTE — Progress Notes (Unsigned)
Office: [phone]  /  Fax: [phone]    Date: December 01, 2018   Appointment Start Time: *** Duration: *** minutes Provider: Glennie Isle, Psy.D. Type of Session: Individual Therapy  Location of Patient: {gbptloc:23249} Location of Provider: {Location of Service:22491} Type of Contact: Telepsychological Visit via [employer] Web[employer]   Session Content: Denise Rice is a 67 y.o. female presenting via [place] for a follow-up appointment to address the previously established treatment goal of decreasing emotional eating. Today's appointment was a telepsychological visit, as it is an option for appointments to reduce exposure to COVID-19. Denise Rice expressed understanding regarding the rationale for telepsychological services, and provided verbal consent for today's appointment. Prior to proceeding with today's appointment, Denise Rice physical location at the time of this appointment was obtained. In the event of technical difficulties, Denise Rice shared a phone number she could be reached at. Denise Rice and this provider participated in today's telepsychological service. Also, Denise Rice denied anyone else being present in the room or on the WebEx appointment ***.  This provider conducted a brief check-in and verbally administered the PHQ-9 and GAD-7. *** Denise Rice was receptive to today's session as evidenced by openness to sharing, responsiveness to feedback, and ***.  Mental Status Examination:  Appearance: {Appearance:22431} Behavior: {Behavior:22445} Mood: {gbmood:21757} Affect: {Affect:22436} Speech: {Speech:22432} Eye Contact: {Eye Contact:22433} Psychomotor Activity: {Motor Activity:22434} Thought Process: {thought process:22448}  Content/Perceptual Disturbances: {disturbances:22451} Orientation: {Orientation:22437} Cognition/Sensorium: {gbcognition:22449} Insight: {Insight:22446} Judgment: {Insight:22446}  Structured Assessment Results: The Patient Health Questionnaire-9 (PHQ-9) is a self-report measure  that assesses symptoms and severity of depression over the course of the last two weeks. Denise Rice obtained a score of *** suggesting {GBPHQ9SEVERITY:21752}. Denise Rice finds the endorsed symptoms to be {gbphq9difficulty:21754}. Little interest or pleasure in doing things ***  Feeling down, depressed, or hopeless ***  Trouble falling or staying asleep, or sleeping too much ***  Feeling tired or having little energy ***  Poor appetite or overeating ***  Feeling bad about yourself --- or that you are a failure or have let yourself or your family down ***  Trouble concentrating on things, such as reading the newspaper or watching television ***  Moving or speaking so slowly that other people could have noticed? Or the opposite --- being so fidgety or restless that you have been moving around a lot more than usual ***  Thoughts that you would be better off dead or hurting yourself in some way ***  PHQ-9 Score ***    The Generalized Anxiety Disorder-7 (GAD-7) is a brief self-report measure that assesses symptoms of anxiety over the course of the last two weeks. Denise Rice obtained a score of *** suggesting {gbgad7severity:21753}. Denise Rice finds the endorsed symptoms to be {gbphq9difficulty:21754}. Feeling nervous, anxious, on edge ***  Not being able to stop or control worrying ***  Worrying too much about different things ***  Trouble relaxing ***  Being so restless that it's hard to sit still ***  Becoming easily annoyed or irritable ***  Feeling afraid as if something awful might happen ***  GAD-7 Score ***   Interventions:  {Interventions:22172}  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 Denise Rice has demonstrated progress in her goal as evidenced by {gbtxprogress:22839}. Denise Rice also reported  {gbtxprogress2:22951}.  Plan: Denise Rice continues to appear able and willing to participate as evidenced by engagement in reciprocal conversation, and asking questions for clarification as appropriate. The next appointment  will be scheduled in {gbweeks:21758}, which will be via [employer]. The next session will focus on reviewing learned skills, and working towards the established treatment goal.***

## 2018-11-24 ENCOUNTER — Ambulatory Visit (INDEPENDENT_AMBULATORY_CARE_PROVIDER_SITE_OTHER): Payer: 59 | Admitting: Physician Assistant

## 2018-11-24 ENCOUNTER — Encounter (INDEPENDENT_AMBULATORY_CARE_PROVIDER_SITE_OTHER): Payer: Self-pay

## 2018-11-25 ENCOUNTER — Ambulatory Visit: Payer: 59 | Admitting: Psychology

## 2018-11-26 ENCOUNTER — Encounter: Payer: Self-pay | Admitting: Family Medicine

## 2018-11-26 DIAGNOSIS — H90A21 Sensorineural hearing loss, unilateral, right ear, with restricted hearing on the contralateral side: Secondary | ICD-10-CM | POA: Diagnosis not present

## 2018-11-29 NOTE — Telephone Encounter (Signed)
Drug registry checked last fill 11-09-18 #15  Tramadol.

## 2018-11-30 ENCOUNTER — Encounter (INDEPENDENT_AMBULATORY_CARE_PROVIDER_SITE_OTHER): Payer: Self-pay | Admitting: Physician Assistant

## 2018-12-01 ENCOUNTER — Encounter (INDEPENDENT_AMBULATORY_CARE_PROVIDER_SITE_OTHER): Payer: Self-pay

## 2018-12-01 ENCOUNTER — Ambulatory Visit (INDEPENDENT_AMBULATORY_CARE_PROVIDER_SITE_OTHER): Payer: 59 | Admitting: Psychology

## 2018-12-02 ENCOUNTER — Ambulatory Visit: Payer: 59 | Admitting: Psychology

## 2018-12-02 ENCOUNTER — Ambulatory Visit (INDEPENDENT_AMBULATORY_CARE_PROVIDER_SITE_OTHER): Payer: 59 | Admitting: Physician Assistant

## 2018-12-05 ENCOUNTER — Encounter (INDEPENDENT_AMBULATORY_CARE_PROVIDER_SITE_OTHER): Payer: Self-pay | Admitting: Family Medicine

## 2018-12-06 ENCOUNTER — Other Ambulatory Visit: Payer: Self-pay | Admitting: Family Medicine

## 2018-12-06 MED FILL — hydrOXYzine HCL 25 MG TABS: 25 | 90 days supply | Qty: 180 | Fill #3

## 2018-12-06 MED FILL — CYANOCOBALAMIN 1,000 MCG/ML: 1000 | 30 days supply | Qty: 1 | Fill #6

## 2018-12-06 MED FILL — AMOXICILLIN 500 MG CAPSULE: 500 | 10 days supply | Qty: 30 | Fill #0

## 2018-12-06 MED FILL — AJOVY 225 MG/1.5ML SOSY: 225 | 30 days supply | Qty: 2 | Fill #11

## 2018-12-06 MED FILL — ACETAMINOPHEN/COD #3 TABLET: 300-30 | 2 days supply | Qty: 8 | Fill #0

## 2018-12-06 MED FILL — BUSPIRONE HCL 7.5 MG TABS: 7.5 | 30 days supply | Qty: 90 | Fill #1

## 2018-12-07 ENCOUNTER — Other Ambulatory Visit: Payer: Self-pay | Admitting: Family Medicine

## 2018-12-07 ENCOUNTER — Encounter: Payer: Self-pay | Admitting: Family Medicine

## 2018-12-07 ENCOUNTER — Other Ambulatory Visit: Payer: Self-pay | Admitting: *Deleted

## 2018-12-07 ENCOUNTER — Ambulatory Visit: Payer: 59 | Admitting: Psychology

## 2018-12-07 ENCOUNTER — Other Ambulatory Visit: Payer: Self-pay

## 2018-12-07 DIAGNOSIS — H90A21 Sensorineural hearing loss, unilateral, right ear, with restricted hearing on the contralateral side: Secondary | ICD-10-CM | POA: Diagnosis not present

## 2018-12-07 MED ORDER — TRAMADOL HCL 50 MG PO TABS: 50.0000 mg | ORAL_TABLET | Freq: Four times a day (QID) | ORAL | 0 refills | Status: DC | PRN

## 2018-12-07 MED FILL — traMADol HCL 50 MG TABS: 50 | 3 days supply | Qty: 15 | Fill #0

## 2018-12-07 NOTE — Telephone Encounter (Signed)
Drug registry checked. Last fill 11-09-18 Tramadol 50mg  #15.

## 2018-12-07 NOTE — Telephone Encounter (Signed)
Spoke with patient.  She was asking for refill on her Valium and Ambien prescriptions.  I advised that Dr. Jonni Sanger was out of the office and that I had forwarded the refill request but could not guarantee she would fill them because she would not be back until 12/13/2018.  Patient was very upset and stated "what am I supposed to do, go into withdrawals?"  I explained that because these are controlled substances, another provider is unlikely to fill it.  She stated that was a dumb policy that needed to be looked at.  Please advise.

## 2018-12-08 MED FILL — ZOLPIDEM TARTRATE 10 MG TAB: 10 | 30 days supply | Qty: 30 | Fill #0

## 2018-12-08 MED FILL — DIAZEPAM 10 MG TABS: 10 | 30 days supply | Qty: 60 | Fill #0

## 2018-12-08 NOTE — Telephone Encounter (Signed)
Will not be able to fill rx.

## 2018-12-12 ENCOUNTER — Encounter (INDEPENDENT_AMBULATORY_CARE_PROVIDER_SITE_OTHER): Payer: 59 | Admitting: Ophthalmology

## 2018-12-12 DIAGNOSIS — S0502XA Injury of conjunctiva and corneal abrasion without foreign body, left eye, initial encounter: Secondary | ICD-10-CM

## 2018-12-13 ENCOUNTER — Emergency Department (HOSPITAL_BASED_OUTPATIENT_CLINIC_OR_DEPARTMENT_OTHER): Payer: 59

## 2018-12-13 ENCOUNTER — Ambulatory Visit: Payer: Self-pay | Admitting: *Deleted

## 2018-12-13 ENCOUNTER — Other Ambulatory Visit: Payer: Self-pay

## 2018-12-13 ENCOUNTER — Encounter (HOSPITAL_BASED_OUTPATIENT_CLINIC_OR_DEPARTMENT_OTHER): Payer: Self-pay | Admitting: *Deleted

## 2018-12-13 ENCOUNTER — Emergency Department (HOSPITAL_BASED_OUTPATIENT_CLINIC_OR_DEPARTMENT_OTHER)
Admission: EM | Admit: 2018-12-13 | Discharge: 2018-12-13 | Disposition: A | Discharge status: Home / Self Care | Payer: 59 | Attending: Emergency Medicine | Admitting: Emergency Medicine

## 2018-12-13 DIAGNOSIS — H16223 Keratoconjunctivitis sicca, not specified as Sjogren's, bilateral: Secondary | ICD-10-CM | POA: Diagnosis not present

## 2018-12-13 DIAGNOSIS — R1084 Generalized abdominal pain: Secondary | ICD-10-CM | POA: Insufficient documentation

## 2018-12-13 DIAGNOSIS — R110 Nausea: Secondary | ICD-10-CM | POA: Diagnosis not present

## 2018-12-13 DIAGNOSIS — Z9884 Bariatric surgery status: Secondary | ICD-10-CM | POA: Insufficient documentation

## 2018-12-13 DIAGNOSIS — Z888 Allergy status to other drugs, medicaments and biological substances status: Secondary | ICD-10-CM | POA: Insufficient documentation

## 2018-12-13 DIAGNOSIS — R109 Unspecified abdominal pain: Secondary | ICD-10-CM | POA: Diagnosis not present

## 2018-12-13 DIAGNOSIS — H0220C Unspecified lagophthalmos, bilateral, upper and lower eyelids: Secondary | ICD-10-CM | POA: Diagnosis not present

## 2018-12-13 LAB — CBC WITH DIFFERENTIAL/PLATELET
Abs Immature Granulocytes: 0.02 10*3/uL (ref 0.00–0.07)
Basophils Absolute: 0 10*3/uL (ref 0.0–0.1)
Basophils Relative: 1 %
Eosinophils Absolute: 0.1 10*3/uL (ref 0.0–0.5)
Eosinophils Relative: 1 %
HCT: 37.4 % (ref 36.0–46.0)
Hemoglobin: 12.4 g/dL (ref 12.0–15.0)
Immature Granulocytes: 0 %
Lymphocytes Relative: 19 %
Lymphs Abs: 1.3 10*3/uL (ref 0.7–4.0)
MCH: 27 pg (ref 26.0–34.0)
MCHC: 33.2 g/dL (ref 30.0–36.0)
MCV: 81.3 fL (ref 80.0–100.0)
Monocytes Absolute: 0.5 10*3/uL (ref 0.1–1.0)
Monocytes Relative: 7 %
Neutro Abs: 4.7 10*3/uL (ref 1.7–7.7)
Neutrophils Relative %: 72 %
Platelets: 202 10*3/uL (ref 150–400)
RBC: 4.6 MIL/uL (ref 3.87–5.11)
RDW: 13.5 % (ref 11.5–15.5)
WBC: 6.6 10*3/uL (ref 4.0–10.5)
nRBC: 0 % (ref 0.0–0.2)

## 2018-12-13 LAB — COMPREHENSIVE METABOLIC PANEL
ALT: 16 U/L (ref 0–44)
AST: 22 U/L (ref 15–41)
Albumin: 3.7 g/dL (ref 3.5–5.0)
Alkaline Phosphatase: 83 U/L (ref 38–126)
Anion gap: 9 (ref 5–15)
BUN: 21 mg/dL (ref 8–23)
CO2: 22 mmol/L (ref 22–32)
Calcium: 8.1 mg/dL — ABNORMAL LOW (ref 8.9–10.3)
Chloride: 107 mmol/L (ref 98–111)
Creatinine, Ser: 0.72 mg/dL (ref 0.44–1.00)
GFR calc Af Amer: >60 mL/min (ref >60)
GFR calc non Af Amer: >60 mL/min (ref >60)
Glucose, Bld: 106 mg/dL — ABNORMAL HIGH (ref 70–99)
Potassium: 3.9 mmol/L (ref 3.5–5.1)
Sodium: 138 mmol/L (ref 135–145)
Total Bilirubin: 0.6 mg/dL (ref 0.3–1.2)
Total Protein: 7 g/dL (ref 6.5–8.1)

## 2018-12-13 LAB — LIPASE, BLOOD: Lipase: 30 U/L (ref 11–51)

## 2018-12-13 MED ORDER — ONDANSETRON HCL 4 MG/2ML IJ SOLN
4.0000 mg | Freq: Once | INTRAMUSCULAR | Status: AC
  Administered 2018-12-13: 19:00:00 4 mg via INTRAVENOUS
  Filled 2018-12-13: qty 2

## 2018-12-13 MED ORDER — HYDROMORPHONE HCL 1 MG/ML IJ SOLN
1.0000 mg | Freq: Once | INTRAMUSCULAR | Status: AC
  Administered 2018-12-13: 1 mg via INTRAVENOUS
  Filled 2018-12-13: qty 1

## 2018-12-13 MED ORDER — ONDANSETRON 4 MG PO TBDP: 4.0000 mg | ORAL_TABLET | Freq: Three times a day (TID) | ORAL | 0 refills | Status: DC | PRN

## 2018-12-13 MED ORDER — IOHEXOL 300 MG/ML  SOLN
100.0000 mL | Freq: Once | INTRAMUSCULAR | Status: AC | PRN
  Administered 2018-12-13: 100 mL via INTRAVENOUS

## 2018-12-13 MED ORDER — SODIUM CHLORIDE 0.9 % IV BOLUS
500.0000 mL | Freq: Once | INTRAVENOUS | Status: AC
  Administered 2018-12-13: 500 mL via INTRAVENOUS

## 2018-12-13 MED ORDER — DICYCLOMINE HCL 20 MG PO TABS: 20.0000 mg | ORAL_TABLET | Freq: Three times a day (TID) | ORAL | 0 refills | Status: DC | PRN

## 2018-12-13 NOTE — ED Provider Notes (Signed)
Emergency Department Provider Note   I have reviewed the triage vital signs and the nursing notes.   HISTORY  Chief Complaint Abdominal Pain   HPI Denise Rice is a 68 y.o. female with PMH of SBO, pancreatitis, and gastric bypass surgery presents to the ED with abdominal pain and nausea.  She has had burning diffuse abdominal discomfort over the past 2 weeks but today felt a sudden, severe "vice" like pain in the center of her abdomen with nausea.  She has not had vomiting and continues to have bowel movements.  No diarrhea.  Denies fevers or chills.  No chest pain or shortness of breath.  She states she has had similar pain with bowel obstruction in the past. No radiation of symptoms or modifying factors.   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    Vertigo    Vitamin B12 deficiency 07/29/2018   Started IM injections monthly 06/2018   Vitamin D deficiency    Wears hearing aid    both ears    Patient Active Problem List   Diagnosis Date Noted   Vitamin B12 deficiency 07/29/2018   Anxiety 03/14/2018   Elschnig bodies following cataract surgery, 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   Small bowel obstruction ([place]) 11/17/2017   Chronic vertigo 03/11/2017   Major depression, chronic 03/11/2017   PCOS (polycystic ovarian syndrome) 03/11/2017   Insomnia 09/08/2015   Bilateral  sensorineural hearing loss 09/06/2014   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 06/25/2012   History of laparoscopic adjustable gastric banding 01/22/2011   Vitamin D deficiency 01/15/2008    Past Surgical History:  Procedure Laterality Date   BLEPHAROPLASTY  03-05-2000   CRANIOTOMY  08/2014   to repair dehescence in right ear   HAMMER TOE SURGERY Left 08-29-2013   2nd toe   IMPLANTATION BONE ANCHORED HEARING AID Left 07/27/2008   left temporal bone;now removed   LAPAROSCOPIC CHOLECYSTECTOMY  03-31-2005   LAPAROSCOPIC GASTRIC BANDING  02-08-2007   LAPAROSCOPY N/A 11/17/2017   Procedure: LAPAROSCOPY LYSIS OF ADHESIONS FOR SMALL BOWEL OBSTRUCTION;  Surgeon: Excell Seltzer, MD;  Location: [place];  Service: General;  Laterality: N/A;   LASER ABLATION CONDOLAMATA N/A 07/30/2012   Procedure: LASER ABLATION CONDOLAMATA;  Surgeon: Leighton Ruff, MD;  Location: [place];  Service: General;  Laterality: N/A;   LASIK Bilateral 2002   LYSIS OF ADHESION N/A 11/17/2017   Procedure: LYSIS OF ADHESION;  Surgeon: Excell Seltzer, MD;  Location: [place];  Service: General;  Laterality: N/A;   ROUX-EN-Y GASTRIC BYPASS  AUG 2013   [place];  1994 x2;  02-03-1998   STRABISMUS SURGERY Right    VAGINAL HYSTERECTOMY  2007   fibroids    Allergies Topiramate  Family History  Problem Relation Age of Onset   Heart attack Mother    Heart disease Mother    Diabetes Mother    Hypertension Mother    Hyperlipidemia Mother    Stroke Mother    Kidney disease Mother    Thyroid disease Mother    Anxiety disorder Mother    Obesity Mother    Cancer Father        unknown primary   Diabetes Father    Hypertension Father    Hyperlipidemia Father    Heart disease Father    Stroke Father    Kidney disease Father    Obesity Father    Stroke Sister    Diabetes Sister    Diabetes  Maternal Aunt    Heart disease Maternal Aunt    Cancer Paternal Aunt    Cancer Paternal Uncle    Colon cancer Neg Hx    Colon polyps Neg Hx     Social History Social History   Tobacco Use   Smoking status: Never Smoker   Smokeless tobacco: Never Used  Substance Use Topics   Alcohol use: No    Alcohol/week: 0.0 standard drinks   Drug use: No    Review of Systems  Constitutional: No fever/chills Eyes: No visual changes. ENT: No sore throat. Cardiovascular: Denies chest pain. Respiratory: Denies shortness of breath. Gastrointestinal: Positive abdominal pain. Positive nausea, no vomiting.  No diarrhea.  No constipation. Genitourinary: Negative for dysuria. Musculoskeletal: Negative for back pain. Skin: Negative for rash. Neurological: Negative for headaches, focal weakness or numbness.  10-point ROS otherwise negative.  ____________________________________________   PHYSICAL EXAM:  VITAL SIGNS: ED Triage Vitals  Enc Vitals Group     BP 12/13/18 1825 (!) 148/102     Pulse Rate 12/13/18 1825 72     Resp 12/13/18 1825 16     Temp 12/13/18 1825 98.5 F (36.9 C)     Temp Source 12/13/18 1825 Oral     SpO2 12/13/18 1825 100 %     Weight 12/13/18 1823 176 lb (79.8 kg)     Height 12/13/18 1823 5\' 1"  (1.549 m)   Constitutional: Alert and oriented. Appears uncomfortable.  Eyes: Conjunctivae are normal. Head: Atraumatic. Nose: No congestion/rhinnorhea. Mouth/Throat: Mucous membranes are moist. Neck: No stridor.  Cardiovascular: Normal rate, regular rhythm. Good peripheral circulation. Grossly normal heart sounds.   Respiratory: Normal respiratory effort.  No retractions. Lungs CTAB. Gastrointestinal: Soft with mild diffuse abdominal tenderness. No rebound or guarding. No distention.  Musculoskeletal: No lower extremity tenderness nor edema. No gross deformities of extremities. Neurologic:  Normal speech and language. No gross focal neurologic deficits are  appreciated.  Skin:  Skin is warm, dry and intact. No rash noted.   ____________________________________________   LABS (all labs ordered are listed, but only abnormal results are displayed)  Labs Reviewed  COMPREHENSIVE METABOLIC PANEL - Abnormal; Notable for the following components:      Result Value   Glucose, Bld 106 (*)    Calcium 8.1 (*)    All other components within normal limits  LIPASE, BLOOD  CBC WITH DIFFERENTIAL/PLATELET   ____________________________________________  RADIOLOGY  Ct Abdomen Pelvis W Contrast  Result Date: 12/13/2018 CLINICAL DATA:  Acute abdominal pain for 2 weeks EXAM: CT ABDOMEN AND PELVIS WITH CONTRAST TECHNIQUE: Multidetector CT imaging of the abdomen and pelvis was performed using the standard protocol following bolus administration of intravenous contrast. CONTRAST:  131mL OMNIPAQUE IOHEXOL 300 MG/ML  SOLN COMPARISON:  August 02, 2018 FINDINGS: Lower chest: The visualized heart size within normal limits. No pericardial fluid/thickening. No hiatal hernia. The visualized portions of the lungs are clear. Hepatobiliary: Again seen is a low-density lesion in the inferior right hepatic lobe measuring 2 cm, likely hepatic cyst. There is also a tiny 6 mm low-density lesion in the left hepatic lobe as on prior exam. Mild intrahepatic biliary ductal dilatation as on prior exam.The main portal vein is patent. The patient is status post cholecystectomy. No biliary ductal dilation. Pancreas: Unremarkable. No pancreatic ductal dilatation or surrounding inflammatory changes. Spleen: Normal in size without focal abnormality. Adrenals/Urinary Tract: Both adrenal glands appear normal. Again noted are bilateral low-density lesions within both kidneys the largest measuring 5 cm in the lower pole of the right kidney. No hydronephrosis. No renal or collecting system calculi. The bladder is unremarkable. Stomach/Bowel: The patient is status post gastric bypass. Postsurgical  changes are again identified. The small bowel and colon are normal in size and contour. There is a moderate amount of colonic stool. Scattered colonic diverticula are noted without diverticulitis. The appendix is normal in appearance. Vascular/Lymphatic: There are no enlarged mesenteric, retroperitoneal, or pelvic lymph nodes. Dense vascular calcifications are seen at the origin of the left main renal artery. Reproductive: The patient is status post hysterectomy. No adnexal masses or collections seen. Other: A small fat containing anterior umbilical hernia is noted. Musculoskeletal: No acute or significant osseous findings. Degenerative changes are seen in the thoracolumbar spine. IMPRESSION: No acute intra-abdominal or pelvic pathology to explain the patient's symptoms. Unchanged hepatic and renal cysts. Diverticulosis without diverticulitis. Electronically Signed   By: Prudencio Pair M.D.   On: 12/13/2018 20:21    ____________________________________________   PROCEDURES  Procedure(s) performed:   Procedures  None ____________________________________________   INITIAL IMPRESSION / ASSESSMENT AND PLAN / ED COURSE  Pertinent labs & imaging results that were available during my care of the patient were reviewed by me and considered in my medical decision making (see chart for details).   Patient with complicated past surgical history presents with abdominal pain worsening significantly today with nausea.  Question partial small bowel obstruction lower suspicion for acute ischemic process, internal hernia, or colitis.  Plan for CT imaging of the abdomen and pelvis.  CBC reviewed with no leukocytosis or other acute finding.  CMP and lipase are pending.  Labs and CT abdomen pelvis reviewed with no acute process. Patient feeling much better on reassessment. No vomiting. Plan for bentyl, zofran, and outpatient GI follow up. Discussed ED return precautions.   ____________________________________________  FINAL CLINICAL IMPRESSION(S) / ED DIAGNOSES  Final diagnoses:  Generalized abdominal pain  Nausea     MEDICATIONS GIVEN DURING THIS VISIT:  Medications  sodium chloride 0.9 % bolus 500 mL (0 mLs Intravenous Stopped 12/13/18 2053)  ondansetron (ZOFRAN) injection 4 mg (4 mg Intravenous Given 12/13/18 1859)  HYDROmorphone (DILAUDID) injection 1 mg (1 mg Intravenous Given 12/13/18 1900)  iohexol (OMNIPAQUE) 300 MG/ML solution 100 mL (100 mLs Intravenous Contrast Given 12/13/18 1956)     NEW OUTPATIENT MEDICATIONS STARTED DURING THIS VISIT:  Discharge Medication List as of 12/13/2018  8:50 PM    START taking these medications   Details  dicyclomine (BENTYL) 20 MG tablet Take 1 tablet (20 mg total) by mouth 3 (three) times daily as needed for spasms., Starting Mon 12/13/2018, Print    ondansetron (ZOFRAN ODT) 4 MG disintegrating tablet Take 1 tablet (4 mg total) by mouth every 8 (eight) hours as needed for  nausea., Starting Mon 12/13/2018, Print        Note:  This document was prepared using Dragon voice recognition software and may include unintentional dictation errors.  Nanda Quinton, MD, [facility]    Marquize Seib, [facility], MD 12/14/18 [phone]

## 2018-12-13 NOTE — Discharge Instructions (Signed)

## 2018-12-13 NOTE — Telephone Encounter (Signed)
Pt called with abd pain that she had before when she had to have bowel obstruction surgery in 2019. The pain has gotten progressively worst within the last 2 hours. She stated that she feels hot and cold. Denied having a feve. The pain is in the same spot as before. She has tried tums and pepto and nothing is helping.  Pt sounds like she is obviously distress. Did not finish the protocol questions.  Advised her to go to the ED for treatment. Her husband will be driving her to the ED. Routing to [facility] at [facility] for review.  Reason for Disposition . [1] SEVERE pain (e.g., excruciating) AND [2] present > 1 hour  Answer Assessment - Initial Assessment Questions 1. LOCATION: "Where does it hurt?"      Upper abd around the belly button 2. RADIATION: "Does the pain shoot anywhere else?" (e.g., chest, back)     *No Answer* 3. ONSET: "When did the pain begin?" (e.g., minutes, hours or days ago)     Today and has progressively gotten worst in the last 2 hours.  4. SUDDEN: "Gradual or sudden onset?"     *No Answer* 5. PATTERN "Does the pain come and go, or is it constant?"    - If constant: "Is it getting better, staying the same, or worsening?"      (Note: Constant means the pain never goes away completely; most serious pain is constant and it progresses)     - If intermittent: "How long does it last?" "Do you have pain now?"     (Note: Intermittent means the pain goes away completely between bouts)     *No Answer* 6. SEVERITY: "How bad is the pain?"  (e.g., Scale 1-10; mild, moderate, or severe)    - MILD (1-3): doesn't interfere with normal activities, abdomen soft and not tender to touch     - MODERATE (4-7): interferes with normal activities or awakens from sleep, tender to touch     - SEVERE (8-10): excruciating pain, doubled over, unable to do any normal activities       severe 7. RECURRENT SYMPTOM: "Have you ever had this type of abdominal pain before?" If so, ask: "When was the  last time?" and "What happened that time?"      *No Answer* 8. AGGRAVATING FACTORS: "Does anything seem to cause this pain?" (e.g., foods, stress, alcohol)     *No Answer* 9. CARDIAC SYMPTOMS: "Do you have any of the following symptoms: chest pain, difficulty breathing, sweating, nausea?"     *No Answer* 10. OTHER SYMPTOMS: "Do you have any other symptoms?" (e.g., fever, vomiting, diarrhea)       *No Answer* 11. PREGNANCY: "Is there any chance you are pregnant?" "When was your last menstrual period?"       *No Answer*  Protocols used: ABDOMINAL PAIN - UPPER-A-AH

## 2018-12-13 NOTE — ED Triage Notes (Signed)
Abdominal pain x 2 weeks 

## 2018-12-13 NOTE — ED Notes (Signed)
Patient transported to CT 

## 2018-12-14 ENCOUNTER — Encounter: Payer: Self-pay | Admitting: Family Medicine

## 2018-12-14 MED FILL — ONDANSETRON ODT 4 MG TABLET: 4 | 7 days supply | Qty: 20 | Fill #0

## 2018-12-14 MED FILL — DICYCLOMINE 20 MG TABLET: 20 | 7 days supply | Qty: 20 | Fill #0

## 2018-12-14 NOTE — Telephone Encounter (Signed)
Noted.  FYI-Patient went to [place] for evaluation

## 2018-12-14 NOTE — Telephone Encounter (Signed)
See note

## 2018-12-22 ENCOUNTER — Ambulatory Visit (INDEPENDENT_AMBULATORY_CARE_PROVIDER_SITE_OTHER): Payer: 59 | Admitting: Family Medicine

## 2018-12-22 ENCOUNTER — Encounter: Payer: Self-pay | Admitting: Family Medicine

## 2018-12-22 DIAGNOSIS — E739 Lactose intolerance, unspecified: Secondary | ICD-10-CM | POA: Diagnosis not present

## 2018-12-22 DIAGNOSIS — K2900 Acute gastritis without bleeding: Secondary | ICD-10-CM

## 2018-12-22 DIAGNOSIS — Z8719 Personal history of other diseases of the digestive system: Secondary | ICD-10-CM | POA: Diagnosis not present

## 2018-12-22 DIAGNOSIS — Z9884 Bariatric surgery status: Secondary | ICD-10-CM

## 2018-12-22 DIAGNOSIS — K580 Irritable bowel syndrome with diarrhea: Secondary | ICD-10-CM

## 2018-12-22 MED ORDER — ONDANSETRON 4 MG PO TBDP: 4.0000 mg | ORAL_TABLET | Freq: Three times a day (TID) | ORAL | 0 refills | Status: DC | PRN

## 2018-12-22 MED ORDER — OMEPRAZOLE 20 MG PO CPDR: 20.0000 mg | DELAYED_RELEASE_CAPSULE | Freq: Every day | ORAL | 0 refills | Status: DC

## 2018-12-22 MED ORDER — DICYCLOMINE HCL 20 MG PO TABS: 20.0000 mg | ORAL_TABLET | Freq: Three times a day (TID) | ORAL | 3 refills | Status: DC | PRN

## 2018-12-22 MED FILL — ONDANSETRON ODT 4 MG TABLET: 4 | 6 days supply | Qty: 20 | Fill #0

## 2018-12-22 MED FILL — DICYCLOMINE 20 MG TABLET: 20 | 20 days supply | Qty: 60 | Fill #0

## 2018-12-22 MED FILL — OMEPRAZOLE 20 MG CAP: 20 | 90 days supply | Qty: 90 | Fill #0

## 2018-12-22 NOTE — Progress Notes (Signed)
Virtual Visit via Video Note  Subjective  CC:  Chief Complaint  Patient presents with  . Hospitalization Follow-up     I connected with [name] on 12/22/18 at  8:20 AM EST by a video enabled telemedicine application and verified that I am speaking with the correct person using two identifiers. Location patient: Home Location provider: [facility] [place], Office Persons participating in the virtual visit: Denise Rice, [name], MD Gertie Exon, CMA  I discussed the limitations of evaluation and management by telemedicine and the availability of in person appointments. The patient expressed understanding and agreed to proceed.  HPI: Denise Rice is a 68 y.o. female who presents to the office today to address the problems listed above in the chief complaint. ER for severe abdominal pain: I've personally reviewed recent office visit notes, hospital notes, associated labs and imaging reports and/or pertinent outside office records via chart review or CareEverywhere. Briefly, reports sharp periumbilical and upper sharp severe abdominal pain associated with bloating and cramping. ER eval was unremarkable with nl lab work and unremarkable CT. Has h/o gastric bypass and SBO. Was discharged with bentyl and zofran. ? IBS. Reports doing better but had 2 more less severe attacks. Describes n w/o vomiting, sharp pain and cramping, loose stool and pain with spicy foods (which she usually tolerates well) so has been eating a bland diet. Using zofran and bentyl: both beneficial.  Looking back, has always had a "rotten stomach": pain with certain foods, cramping, loose stools alternating with normal. Never evaluated as patient tolerated it pretty well and could manage her sxs with diet.   ROS: neg fever, neg blood in stool, no mucous in stool; CT with diverticulosis w/o diverticulitis Assessment  1. Acute gastritis without hemorrhage, unspecified gastritis type   2. Lactose  intolerance   3. Irritable bowel syndrome with diarrhea   4. History of small bowel obstruction   5. 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 History is consistent and bentyl helping. Continue and add PPI. Education given. Monitoring for worsening sxs, blood in stool, fever. Can use PPI for 4-6 weeks if helping. zofran only short term if needed. Refilled bentyl and will monitor.    Follow up: prn   Meds ordered this encounter  Medications  . omeprazole (PRILOSEC) 20 MG capsule    Sig: Take 1 capsule (20 mg total) by mouth daily.    Dispense:  90 capsule    Refill:  0  . DISCONTD: ondansetron (ZOFRAN ODT) 4 MG disintegrating tablet    Sig: Take 1 tablet (4 mg total) by mouth every 8 (eight) hours as needed for nausea.    Dispense:  20 tablet    Refill:  0  . dicyclomine (BENTYL) 20 MG tablet    Sig: Take 1 tablet (20 mg total) by mouth 3 (three) times daily as needed for spasms.    Dispense:  60 tablet    Refill:  3  . ondansetron (ZOFRAN ODT) 4 MG disintegrating tablet    Sig: Take 1 tablet (4 mg total) by mouth every 8 (eight) hours as needed for nausea.    Dispense:  20 tablet    Refill:  0      I reviewed the patients updated PMH, FH, and SocHx.    Patient Active Problem List   Diagnosis Date Noted  . Bilateral  sensorineural hearing loss 09/06/2014    Priority: High  . Chronic vertigo 03/11/2017    Priority: Medium  . Major depression, chronic 03/11/2017    Priority: Medium  . PCOS (polycystic ovarian syndrome) 03/11/2017    Priority: Medium  . Insomnia 09/08/2015    Priority: Medium  . 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 History of laparoscopic adjustable gastric banding 01/22/2011    Priority: Medium  . Lactose intolerance 12/22/2018    Priority: Low  . 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 Vitamin D deficiency  01/15/2008    Priority: Low  . Vitamin B12 deficiency 07/29/2018  . Anxiety 03/14/2018  . Elschnig bodies following cataract surgery, 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  . Small bowel obstruction ([place]) 11/17/2017   No outpatient medications have been marked as taking for the 12/22/18 encounter (Office Visit) with [name], MD.    Allergies: Patient is allergic to topiramate. Family History: Patient family history includes Anxiety disorder in her mother; Cancer in her father, paternal aunt, and paternal uncle; Diabetes in her father, maternal aunt, mother, and sister; Heart attack in her mother; Heart disease in her father, maternal aunt, and mother; Hyperlipidemia in her father and mother; Hypertension in her father and mother; Kidney disease in her father and mother; Obesity in her father and mother; Stroke in her father, mother, and sister; Thyroid disease in her mother. Social History:  Patient  reports that she has never smoked. She has never used smokeless tobacco. She reports that she does not drink alcohol or use drugs.  Review of Systems: Constitutional: Negative for fever malaise or anorexia Cardiovascular: negative for chest pain Respiratory: negative for SOB or persistent cough Gastrointestinal: negative for abdominal pain  OBJECTIVE Vitals: LMP 01/13/2005  General: no acute distress , A&Ox3  [name], MD

## 2019-01-03 ENCOUNTER — Other Ambulatory Visit: Payer: Self-pay | Admitting: Neurology

## 2019-01-03 ENCOUNTER — Other Ambulatory Visit: Payer: Self-pay

## 2019-01-03 ENCOUNTER — Ambulatory Visit (INDEPENDENT_AMBULATORY_CARE_PROVIDER_SITE_OTHER): Payer: 59 | Admitting: Family Medicine

## 2019-01-03 ENCOUNTER — Ambulatory Visit: Payer: Self-pay

## 2019-01-03 ENCOUNTER — Encounter: Payer: Self-pay | Admitting: Family Medicine

## 2019-01-03 ENCOUNTER — Other Ambulatory Visit: Payer: Self-pay | Admitting: Family Medicine

## 2019-01-03 ENCOUNTER — Other Ambulatory Visit: Payer: Self-pay | Admitting: *Deleted

## 2019-01-03 DIAGNOSIS — M25552 Pain in left hip: Secondary | ICD-10-CM

## 2019-01-03 DIAGNOSIS — M25562 Pain in left knee: Secondary | ICD-10-CM

## 2019-01-03 MED ORDER — HYDROCODONE-ACETAMINOPHEN 5-325 MG PO TABS: 1.0000 | ORAL_TABLET | Freq: Four times a day (QID) | ORAL | 0 refills | Status: DC | PRN

## 2019-01-03 MED ORDER — TRAMADOL HCL 50 MG PO TABS: 50.0000 mg | ORAL_TABLET | Freq: Four times a day (QID) | ORAL | 0 refills | Status: DC | PRN

## 2019-01-03 MED ORDER — TIZANIDINE HCL 2 MG PO TABS: 2.0000 mg | ORAL_TABLET | Freq: Four times a day (QID) | ORAL | 1 refills | Status: DC | PRN

## 2019-01-03 MED FILL — traMADol HCL 50 MG TABS: 50 | 3 days supply | Qty: 15 | Fill #0

## 2019-01-03 MED FILL — HYDROCODON-APAP 5-325: 5-325 | 3 days supply | Qty: 20 | Fill #0

## 2019-01-03 MED FILL — BUSPIRONE HCL 7.5 MG TABS: 7.5 | 30 days supply | Qty: 90 | Fill #0

## 2019-01-03 MED FILL — AJOVY 225 MG/1.5ML SOSY: 225 | 30 days supply | Qty: 2 | Fill #0

## 2019-01-03 MED FILL — CYANOCOBALAMIN 1,000 MCG/ML: 1000 | 30 days supply | Qty: 1 | Fill #7

## 2019-01-03 MED FILL — tiZANidine HCL 2 MG TABS: 2 | 8 days supply | Qty: 60 | Fill #0

## 2019-01-03 MED FILL — DIAZEPAM 10 MG TABS: 10 | 30 days supply | Qty: 60 | Fill #1

## 2019-01-03 NOTE — Progress Notes (Signed)
Left k

## 2019-01-03 NOTE — Telephone Encounter (Signed)
Drug registry checked last fill tramadol 12-07-18 #15.

## 2019-01-03 NOTE — Progress Notes (Signed)
Office Visit Note   Patient: Denise Rice           Date of Birth: January 19, 1950           MRN: [id number] Visit Date: 01/03/2019 Requested by: [name], [place],  [place] [id number] PCP: [name], MD  Subjective: Chief Complaint  Patient presents with  . Left Knee - Pain    [place] forward yesterday at [employer], hitting left knee and left side on the floor. Pain anterior and posterior knee. Hurts to bend the knee and bear weight.  . Left Hip - Pain    HPI: She is here with left hip and knee pain.  Yesterday at [employer], she had an episode of vertigo causing her to stumble and fall forward landing on the front of her left knee.  She did not have any hip contusion but it has been hurting on the posterior lateral aspect since then.  She was able to get up and walk, but with a substantial limp.  She is using a cane for support.  Knee pain is mostly on the medial aspect.  She has some trouble sleeping last night because of her pain.              ROS:   All other systems were reviewed and are negative.  Objective: Vital Signs: LMP 01/13/2005   Physical Exam:  General:  Alert and oriented, in no acute distress. Pulm:  Breathing unlabored. Psy:  Normal mood, congruent affect. Skin: No significant bruising in the hip or knee.  No skin abrasion. Left hip: She is tender posterior to the greater trochanter and the musculature.  No significant bony tenderness, no significant pain with internal hip rotation. Left knee: 2+ effusion with no warmth.  Intact extensor mechanism.  Negative patella apprehension.  Pain but no significant laxity with valgus stress.  Exquisitely tender medial tibial plateau and medial joint line.  Unable to do McMurray's.  Imaging: X-rays left hip: Mild hip DJD, SI joint DJD, and lumbar degenerative disc disease.  No definite fracture seen.  X-rays left knee: Possible nondisplaced fracture of the medial tibial plateau seen on AP view.  I do not  see any other definite fractures.  She has mild degenerative change.  Assessment & Plan: 1.  1 day status post fall with left hip strain and left knee contusion, possible nondisplaced fracture of medial tibial plateau. -Knee immobilizer, minimize weightbearing as much as able.  She cannot use crutches because of her vertigo. -Hydrocodone as needed for severe pain. -Recheck in 2 weeks.  If still having a lot of pain we will repeat x-rays.     Procedures: No procedures performed  No notes on file     PMFS History: Patient Active Problem List   Diagnosis Date Noted  . Lactose intolerance 12/22/2018  . Vitamin B12 deficiency 07/29/2018  . Anxiety 03/14/2018  . Elschnig bodies following cataract surgery, 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  . Small bowel obstruction ([place]) 11/17/2017  . Chronic vertigo 03/11/2017  . Major depression, chronic 03/11/2017  . PCOS (polycystic ovarian syndrome) 03/11/2017  . Insomnia 09/08/2015  . Bilateral sensorineural hearing loss 09/06/2014  . 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 History of laparoscopic adjustable gastric banding 01/22/2011  . Vitamin D deficiency 01/15/2008   Past Medical History:  Diagnosis Date  . Anal condyloma   . Anxiety   . Constipation   .  Deafness in left ear   . Depression   . Fatigue   . Gallbladder problem   . Hearing loss, sensorineural, high frequency    RIGHT EAR  . 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 Last eruption 3/12  . History of peptic ulcer   . Infertility, female   . Lactose intolerance   . Otosclerosis of left ear   . Pancreatic disease   . PCO (polycystic ovaries)   . PCOS (polycystic ovarian syndrome) 03/11/2017  . PONV (postoperative nausea and vomiting)   . 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 Vertigo   . Vitamin B12 deficiency 07/29/2018   Started IM injections  monthly 06/2018  . Vitamin D deficiency   . Wears hearing aid    both ears    Family History  Problem Relation Age of Onset  . Heart attack Mother   . Heart disease Mother   . Diabetes Mother   . Hypertension Mother   . Hyperlipidemia Mother   . Stroke Mother   . Kidney disease Mother   . Thyroid disease Mother   . Anxiety disorder Mother   . Obesity Mother   . Cancer Father        unknown primary  . Diabetes Father   . Hypertension Father   . Hyperlipidemia Father   . Heart disease Father   . Stroke Father   . Kidney disease Father   . Obesity Father   . Stroke Sister   . Diabetes Sister   . Diabetes Maternal Aunt   . Heart disease Maternal Aunt   . Cancer Paternal Aunt   . Cancer Paternal Uncle   . Colon cancer Neg Hx   . Colon polyps Neg Hx     Past Surgical History:  Procedure Laterality Date  . BLEPHAROPLASTY  03-05-2000  . CRANIOTOMY  08/2014   to repair dehescence in right ear  . HAMMER TOE SURGERY Left 08-29-2013   2nd toe  . IMPLANTATION BONE ANCHORED HEARING AID Left 07/27/2008   left temporal bone;now removed  . LAPAROSCOPIC CHOLECYSTECTOMY  03-31-2005  . LAPAROSCOPIC GASTRIC BANDING  02-08-2007  . LAPAROSCOPY N/A 11/17/2017   Procedure: LAPAROSCOPY LYSIS OF ADHESIONS FOR SMALL BOWEL OBSTRUCTION;  Surgeon: Excell Seltzer, MD;  Location: [facility];  Service: General;  Laterality: N/A;  . LASER ABLATION CONDOLAMATA N/A 07/30/2012   Procedure: LASER ABLATION CONDOLAMATA;  Surgeon: Leighton Ruff, MD;  Location: [facility];  Service: General;  Laterality: N/A;  . LASIK Bilateral 2002  . LYSIS OF ADHESION N/A 11/17/2017   Procedure: LYSIS OF ADHESION;  Surgeon: Excell Seltzer, MD;  Location: [facility];  Service: General;  Laterality: N/A;  . ROUX-EN-Y GASTRIC BYPASS  AUG 2013  . [place];  1994 x2;  02-03-1998  . STRABISMUS SURGERY Right   . VAGINAL HYSTERECTOMY  2007   fibroids   Social History   Occupational History  .  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 Smoking status: Never Smoker  . Smokeless tobacco: Never Used  Substance and Sexual Activity  . Alcohol use: No    Alcohol/week: 0.0 standard drinks  . Drug use: No  . Sexual activity: Yes    Birth control/protection: Surgical

## 2019-01-03 NOTE — Telephone Encounter (Signed)
Patient is requesting a refill on Buspirone HCL 7.5 mg Last refill: 12/06/2018 Last OV: 12/22/2018 Upcoming OV: Not one on file

## 2019-01-03 NOTE — Patient Instructions (Signed)
   Possible fracture of medial tibial plateau.

## 2019-01-05 MED FILL — ZOLPIDEM TARTRATE 10 MG TAB: 10 | 30 days supply | Qty: 30 | Fill #1

## 2019-01-11 ENCOUNTER — Encounter: Payer: Self-pay | Admitting: Family Medicine

## 2019-01-11 MED ORDER — TIZANIDINE HCL 2 MG PO TABS: 2.0000 mg | ORAL_TABLET | Freq: Four times a day (QID) | ORAL | 1 refills | Status: DC | PRN

## 2019-01-11 MED FILL — tiZANidine HCL 2 MG TABS: 2 | 7 days supply | Qty: 60 | Fill #0

## 2019-01-17 ENCOUNTER — Ambulatory Visit: Payer: Self-pay

## 2019-01-17 ENCOUNTER — Encounter: Payer: Self-pay | Admitting: Family Medicine

## 2019-01-17 ENCOUNTER — Ambulatory Visit (INDEPENDENT_AMBULATORY_CARE_PROVIDER_SITE_OTHER): Payer: 59 | Admitting: Family Medicine

## 2019-01-17 ENCOUNTER — Other Ambulatory Visit: Payer: Self-pay

## 2019-01-17 DIAGNOSIS — R1033 Periumbilical pain: Secondary | ICD-10-CM | POA: Diagnosis not present

## 2019-01-17 DIAGNOSIS — M25552 Pain in left hip: Secondary | ICD-10-CM | POA: Diagnosis not present

## 2019-01-17 DIAGNOSIS — M25562 Pain in left knee: Secondary | ICD-10-CM | POA: Diagnosis not present

## 2019-01-17 NOTE — Progress Notes (Signed)
Office Visit Note   Patient: Denise Rice           Date of Birth: 07-19-1950           MRN: [id number] Visit Date: 01/17/2019 Requested by: [name], [place],  [place] [address] PCP: [name], MD  Subjective: Chief Complaint  Patient presents with  . Left Knee - Pain, Follow-up    Knee was feeling better, "but I think I got too ambitious." Both knee and hip are hurting. Has been walking, wearing knee immobolizer (in [place] here in office).   . Left Hip - Pain, Follow-up    HPI: She is about 2 weeks status post fall resulting in left hip and knee pain.  She thought she was getting better but over the weekend she "got too ambitious" and tried to do too much, and now her knee is hurting again significantly on the medial and posterior aspect.  Left posterior hip is still hurting as well.  Meanwhile she has developed periumbilical abdominal pain in the past week with alternating constipation and diarrhea.  Pain feels very similar to when she had intestinal blockage requiring surgery in 2019.  Denies fevers.               ROS:   All other systems were reviewed and are negative.  Objective: Vital Signs: LMP 01/13/2005   Physical Exam:  General:  Alert and oriented, in no acute distress. Pulm:  Breathing unlabored. Psy:  Normal mood, congruent affect.  Left hip does not hurt with internal rotation. Left knee: Trace effusion.  Full active extension.  Flexion of about 100 degrees.  She has exquisite tenderness at the medial tibial plateau and is moderately tender in the popliteal fossa. Abdomen: He is tender in the periumbilical region with no rebound or guarding.  Imaging: X-rays left knee:  Horizontal lucency persists at medial tibia.  No sign of collapse.      Assessment & Plan: 1.  Left knee and hip weeks status post fall, suspicious for medial tibial plateau fracture vs. Bone bruise - Hinged knee brace, cautious with activities for 4-6 more  weeks. - MRI if fails to improve.  2.  Abdominal pain with history of intestinal blockage - Encouraged her to make appointment with her new surgeon (Dr. Excell Seltzer has retired).  Will go to ER if worsens. - Try stopping zanaflex.     Procedures: No procedures performed  No notes on file     PMFS History: Patient Active Problem List   Diagnosis Date Noted  . Lactose intolerance 12/22/2018  . Vitamin B12 deficiency 07/29/2018  . Anxiety 03/14/2018  . Elschnig bodies following cataract surgery, 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  . Small bowel obstruction ([place]) 11/17/2017  . Chronic vertigo 03/11/2017  . Major depression, chronic 03/11/2017  . PCOS (polycystic ovarian syndrome) 03/11/2017  . Insomnia 09/08/2015  . Bilateral sensorineural hearing loss 09/06/2014  . 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 History of laparoscopic adjustable gastric banding 01/22/2011  . Vitamin D deficiency 01/15/2008   Past Medical History:  Diagnosis Date  . Anal condyloma   . Anxiety   . Constipation   . Deafness in left ear   . Depression   . Fatigue   . Gallbladder problem   . Hearing loss, sensorineural, high frequency    RIGHT EAR  . 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 Last eruption 3/12  . History of peptic ulcer   . Infertility, female   . Lactose intolerance   . Otosclerosis of left ear   . Pancreatic disease   . PCO (polycystic ovaries)   . PCOS (polycystic ovarian syndrome) 03/11/2017  . PONV (postoperative nausea and vomiting)   . 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 Vertigo   . Vitamin B12 deficiency 07/29/2018   Started IM injections monthly 06/2018  . Vitamin D deficiency   . Wears hearing aid    both ears    Family History  Problem Relation Age of Onset  . Heart attack Mother   . Heart disease Mother   . Diabetes Mother   . Hypertension  Mother   . Hyperlipidemia Mother   . Stroke Mother   . Kidney disease Mother   . Thyroid disease Mother   . Anxiety disorder Mother   . Obesity Mother   . Cancer Father        unknown primary  . Diabetes Father   . Hypertension Father   . Hyperlipidemia Father   . Heart disease Father   . Stroke Father   . Kidney disease Father   . Obesity Father   . Stroke Sister   . Diabetes Sister   . Diabetes Maternal Aunt   . Heart disease Maternal Aunt   . Cancer Paternal Aunt   . Cancer Paternal Uncle   . Colon cancer Neg Hx   . Colon polyps Neg Hx     Past Surgical History:  Procedure Laterality Date  . BLEPHAROPLASTY  03-05-2000  . CRANIOTOMY  08/2014   to repair dehescence in right ear  . HAMMER TOE SURGERY Left 08-29-2013   2nd toe  . IMPLANTATION BONE ANCHORED HEARING AID Left 07/27/2008   left temporal bone;now removed  . LAPAROSCOPIC CHOLECYSTECTOMY  03-31-2005  . LAPAROSCOPIC GASTRIC BANDING  02-08-2007  . LAPAROSCOPY N/A 11/17/2017   Procedure: LAPAROSCOPY LYSIS OF ADHESIONS FOR SMALL BOWEL OBSTRUCTION;  Surgeon: Excell Seltzer, MD;  Location: [facility];  Service: General;  Laterality: N/A;  . LASER ABLATION CONDOLAMATA N/A 07/30/2012   Procedure: LASER ABLATION CONDOLAMATA;  Surgeon: Leighton Ruff, MD;  Location: [facility];  Service: General;  Laterality: N/A;  . LASIK Bilateral 2002  . LYSIS OF ADHESION N/A 11/17/2017   Procedure: LYSIS OF ADHESION;  Surgeon: Excell Seltzer, MD;  Location: [facility];  Service: General;  Laterality: N/A;  . ROUX-EN-Y GASTRIC BYPASS  AUG 2013  . [place];  1994 x2;  02-03-1998  . STRABISMUS SURGERY Right   . VAGINAL HYSTERECTOMY  2007   fibroids   Social History   Occupational History  . 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 Smoking status: Never Smoker  . Smokeless tobacco: Never Used  Substance and Sexual Activity  . Alcohol use: No     Alcohol/week: 0.0 standard drinks  . Drug use: No  . Sexual activity: Yes    Birth control/protection: Surgical

## 2019-01-18 ENCOUNTER — Ambulatory Visit: Payer: 59 | Attending: Internal Medicine

## 2019-01-18 DIAGNOSIS — Z20822 Contact with and (suspected) exposure to covid-19: Secondary | ICD-10-CM

## 2019-01-19 ENCOUNTER — Encounter: Payer: Self-pay | Admitting: Family Medicine

## 2019-01-19 ENCOUNTER — Ambulatory Visit (INDEPENDENT_AMBULATORY_CARE_PROVIDER_SITE_OTHER): Payer: 59 | Admitting: Family Medicine

## 2019-01-19 VITALS — Ht 61.0 in | Wt 176.0 lb

## 2019-01-19 DIAGNOSIS — F5101 Primary insomnia: Secondary | ICD-10-CM

## 2019-01-19 MED ORDER — TRAZODONE HCL 50 MG PO TABS: 25.0000 mg | ORAL_TABLET | Freq: Every evening | ORAL | 3 refills | Status: DC | PRN

## 2019-01-19 MED FILL — traZODone HCL 50 MG TABS: 50 | 30 days supply | Qty: 30 | Fill #0

## 2019-01-19 NOTE — Progress Notes (Signed)
Virtual Visit via Video Note  Subjective  CC:  Chief Complaint  Patient presents with  . Discuss Ambien     I connected with [name] on 01/20/19 at  4:20 PM EST by a video enabled telemedicine application and verified that I am speaking with the correct person using two identifiers. Location patient: Home Location provider: [facility] [place], Office Persons participating in the virtual visit: Deyra Perdomo, [name], MD Serita Sheller, [place] discussed the limitations of evaluation and management by telemedicine and the availability of in person appointments. The patient expressed understanding and agreed to proceed. HPI: Denise Rice is a 69 y.o. female who was contacted today to address the problems listed above in the chief complaint. . 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 Falls asleep ok but awakens after 2-3 hours and can't get back to sleep. She is tired! Discussed a few contributors including lack of exercise since tib plateau fracture, working from home, sleep hygiene issues. No problem with anxiety or mood at this time. No stimulants identified in diet or med list.  Assessment  1. Primary insomnia      Plan   insomnia:  Change to trazadone with education on expectations of stopping ambien after chronic use. reasses in 4 weeks if needed. Discussed other sleep inducing strategies.  I discussed the assessment and treatment plan with the patient. The patient was provided an opportunity to ask questions and all were answered. The patient agreed with the plan and demonstrated an understanding of the instructions.   The patient was advised to call back or seek an in-person evaluation if the symptoms worsen or if the condition fails to improve as anticipated. Follow up: prn  Visit date not found  Meds ordered this encounter  Medications  . traZODone (DESYREL) 50 MG tablet    Sig: Take 0.5-1 tablets (25-50  mg total) by mouth at bedtime as needed for sleep.    Dispense:  30 tablet    Refill:  3      I reviewed the patients updated PMH, FH, and SocHx.    Patient Active Problem List   Diagnosis Date Noted  . Bilateral sensorineural hearing loss 09/06/2014    Priority: High  . History of small bowel obstruction 11/17/2017    Priority: Medium  . Chronic vertigo 03/11/2017    Priority: Medium  . Major depression, chronic 03/11/2017    Priority: Medium  . PCOS (polycystic ovarian syndrome) 03/11/2017    Priority: Medium  . Insomnia 09/08/2015    Priority: Medium  . 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 History of laparoscopic adjustable gastric banding 01/22/2011    Priority: Medium  . Lactose intolerance 12/22/2018    Priority: Low  . 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 Vitamin D deficiency 01/15/2008    Priority: Low  . Vitamin B12 deficiency 07/29/2018  . Anxiety 03/14/2018  . Elschnig bodies following cataract surgery, 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   Current Meds  Medication Sig  . AJOVY 225 MG/1.5ML SOSY INJECT 225 MG INTO THE SKIN EVERY 30 DAYS  . Biotin 5000 MCG CAPS Take 5,000 mcg by mouth daily.   . busPIRone (BUSPAR) 7.5 MG tablet TAKE 1 TABLET BY MOUTH THREE TIMES DAILY AS NEEDED FOR ANXIETY  . cholecalciferol (VITAMIN D3) 25 MCG (1000 UT) tablet Take 1,000 Units  by mouth daily.  . cyanocobalamin (,VITAMIN B-12,) 1000 MCG/ML injection Inject 1 mL (1,000 mcg total) into the muscle every 30 (thirty) days.  . cyanocobalamin 2000 MCG tablet Take 2,000 mcg by mouth daily.  . diazepam (VALIUM) 10 MG tablet TAKE 1 TABLET (10 MG TOTAL) BY MOUTH 2 (TWO) TIMES A DAY.  Marland Kitchen dicyclomine (BENTYL) 20 MG tablet Take 1 tablet (20 mg total) by mouth 3 (three) times daily as needed for spasms.  Marland Kitchen escitalopram (LEXAPRO) 20 MG tablet TAKE 1 TABLET BY MOUTH DAILY.  . ferrous sulfate 325 (65 FE) MG tablet Take 325 mg by mouth daily  with breakfast.  . hydrOXYzine (ATARAX/VISTARIL) 25 MG tablet   . Multiple Vitamin (MULTIVITAMIN WITH MINERALS) TABS tablet Take 1 tablet by mouth daily.  Marland Kitchen omeprazole (PRILOSEC) 20 MG capsule Take 1 capsule (20 mg total) by mouth daily.  Vladimir Faster Glycol-Propyl Glycol (SYSTANE OP) Apply to eye daily.  . Syringe/Needle, Disp, (SYRINGE 3CC/25GX1") 25G X 1" 3 ML MISC Use once monthly for injection of Viatmin B-12  . tiZANidine (ZANAFLEX) 2 MG tablet Take 1-2 tablets (2-4 mg total) by mouth every 6 (six) hours as needed for muscle spasms.  . traMADol (ULTRAM) 50 MG tablet Take 1 tablet (50 mg total) by mouth every 6 (six) hours as needed.  . [DISCONTINUED] ondansetron (ZOFRAN ODT) 4 MG disintegrating tablet Take 1 tablet (4 mg total) by mouth every 8 (eight) hours as needed for nausea.  . [DISCONTINUED] zolpidem (AMBIEN) 10 MG tablet TAKE 1 TABLET (10 MG TOTAL) BY MOUTH AT BEDTIME AS NEEDED FOR SLEEP.    Allergies: Patient is allergic to topiramate. Family History: Patient family history includes Anxiety disorder in her mother; Cancer in her father, paternal aunt, and paternal uncle; Diabetes in her father, maternal aunt, mother, and sister; Heart attack in her mother; Heart disease in her father, maternal aunt, and mother; Hyperlipidemia in her father and mother; Hypertension in her father and mother; Kidney disease in her father and mother; Obesity in her father and mother; Stroke in her father, mother, and sister; Thyroid disease in her mother. Social History:  Patient  reports that she has never smoked. She has never used smokeless tobacco. She reports that she does not drink alcohol or use drugs.  Review of Systems: Constitutional: Negative for fever malaise or anorexia Cardiovascular: negative for chest pain Respiratory: negative for SOB or persistent cough Gastrointestinal: negative for abdominal pain  OBJECTIVE Vitals: Ht 5\' 1"  (1.549 m)   Wt 176 lb (79.8 kg)   LMP 01/13/2005    BMI 33.25 kg/m  General: no acute distress , A&Ox3  [name], MD

## 2019-01-20 ENCOUNTER — Telehealth: Payer: Self-pay | Admitting: Family Medicine

## 2019-01-20 ENCOUNTER — Other Ambulatory Visit: Payer: Self-pay | Admitting: Family Medicine

## 2019-01-20 ENCOUNTER — Encounter: Payer: Self-pay | Admitting: Family Medicine

## 2019-01-20 LAB — NOVEL CORONAVIRUS, NAA: SARS-CoV-2, NAA: NOT DETECTED

## 2019-01-20 MED FILL — ONDANSETRON ODT 4 MG TABLET: 4 | 7 days supply | Qty: 20 | Fill #0

## 2019-01-20 NOTE — Telephone Encounter (Signed)
Patient notified that medication has been sent in. 

## 2019-01-20 NOTE — Telephone Encounter (Signed)
Pt called - she also needs a refill of Ondansetron (Zofran 4mg ) to [facility]

## 2019-01-31 ENCOUNTER — Encounter: Payer: Self-pay | Admitting: Family Medicine

## 2019-01-31 MED FILL — CYANOCOBALAMIN 1,000 MCG/ML: 1000 | 30 days supply | Qty: 1 | Fill #8

## 2019-01-31 MED FILL — DIAZEPAM 10 MG TABS: 10 | 30 days supply | Qty: 60 | Fill #2

## 2019-02-01 MED FILL — AJOVY 225 MG/1.5ML SOSY: 225 | 30 days supply | Qty: 2 | Fill #1

## 2019-02-03 MED FILL — ZOLPIDEM TARTRATE 10 MG TAB: 10 | 30 days supply | Qty: 30 | Fill #2

## 2019-02-07 ENCOUNTER — Encounter: Payer: Self-pay | Admitting: Family Medicine

## 2019-02-07 ENCOUNTER — Ambulatory Visit (INDEPENDENT_AMBULATORY_CARE_PROVIDER_SITE_OTHER): Payer: 59 | Admitting: Family Medicine

## 2019-02-07 ENCOUNTER — Other Ambulatory Visit: Payer: Self-pay

## 2019-02-07 DIAGNOSIS — M25562 Pain in left knee: Secondary | ICD-10-CM

## 2019-02-07 NOTE — Progress Notes (Signed)
Office Visit Note   Patient: Denise Rice           Date of Birth: 02-10-1950           MRN: [id number] Visit Date: 02/07/2019 Requested by: [name], [place],  [place] [address] PCP: [name], MD  Subjective: Chief Complaint  Patient presents with  . Left Knee - Pain    DOI 01/02/20 - Doing better. Walking with just 1 crutch now. Less pain. No longer wearing anything on the knee.    HPI: She is about a month status post fall resulting in left knee contusion and possible nondisplaced fracture of medial tibial plateau versus bone bruise.  Since last visit she has made significant progress.  She is now walking with a cane, and she was even able to kneel today when she got some new appliances.              ROS:   All other systems were reviewed and are negative.  Objective: Vital Signs: LMP 01/13/2005   Physical Exam:  General:  Alert and oriented, in no acute distress. Pulm:  Breathing unlabored. Psy:  Normal mood, congruent affect.  Left knee: 1+ effusion, still slightly tender on the medial tibial plateau and also tender on the posterior lateral knee.  No definite Baker's cyst.  Imaging: None today  Assessment & Plan: 1.  Clinically improving left knee pain 1 month status post fall -Anticipate 3-4 more weeks healing time.  She can gradually resume normal activities at this point using pain as guide. -If still having pain in 3 to 4 weeks she will come back in for 1 more x-ray.     Procedures: No procedures performed  No notes on file     PMFS History: Patient Active Problem List   Diagnosis Date Noted  . Lactose intolerance 12/22/2018  . Vitamin B12 deficiency 07/29/2018  . Anxiety 03/14/2018  . Elschnig bodies following cataract surgery, 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  . History of small bowel obstruction 11/17/2017  . Chronic vertigo 03/11/2017  . Major depression, chronic 03/11/2017  . PCOS (polycystic ovarian syndrome) 03/11/2017    . Insomnia 09/08/2015  . Bilateral sensorineural hearing loss 09/06/2014  . 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 History of laparoscopic adjustable gastric banding 01/22/2011  . Vitamin D deficiency 01/15/2008   Past Medical History:  Diagnosis Date  . Anal condyloma   . Anxiety   . Constipation   . Deafness in left ear   . Depression   . Fatigue   . Gallbladder problem   . Hearing loss, sensorineural, high frequency    RIGHT EAR  . 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 Last eruption 3/12  . History of peptic ulcer   . Infertility, female   . Lactose intolerance   . Otosclerosis of left ear   . Pancreatic disease   . PCO (polycystic ovaries)   . PCOS (polycystic ovarian syndrome) 03/11/2017  . PONV (postoperative nausea and vomiting)   . 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 Vertigo   . Vitamin B12 deficiency 07/29/2018   Started IM injections monthly 06/2018  . Vitamin D deficiency   . Wears hearing aid    both ears    Family History  Problem Relation Age of Onset  . Heart attack Mother   .  Heart disease Mother   . Diabetes Mother   . Hypertension Mother   . Hyperlipidemia Mother   . Stroke Mother   . Kidney disease Mother   . Thyroid disease Mother   . Anxiety disorder Mother   . Obesity Mother   . Cancer Father        unknown primary  . Diabetes Father   . Hypertension Father   . Hyperlipidemia Father   . Heart disease Father   . Stroke Father   . Kidney disease Father   . Obesity Father   . Stroke Sister   . Diabetes Sister   . Diabetes Maternal Aunt   . Heart disease Maternal Aunt   . Cancer Paternal Aunt   . Cancer Paternal Uncle   . Colon cancer Neg Hx   . Colon polyps Neg Hx     Past Surgical History:  Procedure Laterality Date  . BLEPHAROPLASTY  03-05-2000  . CRANIOTOMY  08/2014   to repair dehescence in  right ear  . HAMMER TOE SURGERY Left 08-29-2013   2nd toe  . IMPLANTATION BONE ANCHORED HEARING AID Left 07/27/2008   left temporal bone;now removed  . LAPAROSCOPIC CHOLECYSTECTOMY  03-31-2005  . LAPAROSCOPIC GASTRIC BANDING  02-08-2007  . LAPAROSCOPY N/A 11/17/2017   Procedure: LAPAROSCOPY LYSIS OF ADHESIONS FOR SMALL BOWEL OBSTRUCTION;  Surgeon: Excell Seltzer, MD;  Location: [facility];  Service: General;  Laterality: N/A;  . LASER ABLATION CONDOLAMATA N/A 07/30/2012   Procedure: LASER ABLATION CONDOLAMATA;  Surgeon: Leighton Ruff, MD;  [facility];  Service: General;  Laterality: N/A;  . LASIK Bilateral 2002  . LYSIS OF ADHESION N/A 11/17/2017   Procedure: LYSIS OF ADHESION;  Surgeon: Excell Seltzer, MD;  Location: [facility];  Service: General;  Laterality: N/A;  . ROUX-EN-Y GASTRIC BYPASS  AUG 2013  . [place];  1994 x2;  02-03-1998  . STRABISMUS SURGERY Right   . VAGINAL HYSTERECTOMY  2007   fibroids   Social History   Occupational History  . 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 Smoking status: Never Smoker  . Smokeless tobacco: Never Used  Substance and Sexual Activity  . Alcohol use: No    Alcohol/week: 0.0 standard drinks  . Drug use: No  . Sexual activity: Yes    Birth control/protection: Surgical

## 2019-02-13 ENCOUNTER — Other Ambulatory Visit: Payer: Self-pay | Admitting: Family Medicine

## 2019-02-14 NOTE — Telephone Encounter (Signed)
[facility] Database Verified LR: 01-03-2019 Qty: 15 Pending appointment: 11-08-2019

## 2019-02-15 MED ORDER — TRAMADOL HCL 50 MG PO TABS: 50.0000 mg | ORAL_TABLET | Freq: Four times a day (QID) | ORAL | 0 refills | Status: DC | PRN

## 2019-02-15 MED FILL — traMADol HCL 50 MG TABS: 50 | 4 days supply | Qty: 15 | Fill #0

## 2019-02-18 MED FILL — traZODone HCL 50 MG TABS: 50 | 30 days supply | Qty: 30 | Fill #1

## 2019-02-22 ENCOUNTER — Encounter: Payer: Self-pay | Admitting: Family Medicine

## 2019-02-23 MED ORDER — TIZANIDINE HCL 2 MG PO TABS: 2.0000 mg | ORAL_TABLET | Freq: Four times a day (QID) | ORAL | 3 refills | Status: DC | PRN

## 2019-02-23 MED FILL — tiZANidine HCL 2 MG TABS: 2 | 8 days supply | Qty: 60 | Fill #0

## 2019-03-07 ENCOUNTER — Other Ambulatory Visit: Payer: Self-pay | Admitting: Family Medicine

## 2019-03-07 MED FILL — hydrOXYzine HCL 25 MG TABS: 25 | 90 days supply | Qty: 180 | Fill #0

## 2019-03-08 ENCOUNTER — Telehealth (INDEPENDENT_AMBULATORY_CARE_PROVIDER_SITE_OTHER): Payer: 59 | Admitting: Plastic Surgery

## 2019-03-08 ENCOUNTER — Other Ambulatory Visit: Payer: Self-pay

## 2019-03-08 DIAGNOSIS — Z719 Counseling, unspecified: Secondary | ICD-10-CM

## 2019-03-08 MED FILL — ZOLPIDEM TARTRATE 10 MG TAB: 10 | 30 days supply | Qty: 30 | Fill #3

## 2019-03-08 MED FILL — AJOVY 225 MG/1.5ML SOSY: 225 | 30 days supply | Qty: 2 | Fill #2

## 2019-03-08 MED FILL — CYANOCOBALAMIN 1,000 MCG/ML: 1000 | 30 days supply | Qty: 1 | Fill #9

## 2019-03-08 NOTE — Progress Notes (Signed)
The patient is a 69 year old white female joining me by WebEx for discussion for facial rejuvenation.  She would like correction and lifting of the lateral periorbital area, gels and neck area.  We discussed the difference between a mini facelift and a facelift.  The limitations of the mini facelift compared to the facelift.  The patient would like "to see what the difference would be.  Once she decides we will certainly discuss it in more detail.

## 2019-03-10 ENCOUNTER — Encounter (INDEPENDENT_AMBULATORY_CARE_PROVIDER_SITE_OTHER): Payer: 59 | Admitting: Ophthalmology

## 2019-03-10 ENCOUNTER — Other Ambulatory Visit: Payer: Self-pay

## 2019-03-10 DIAGNOSIS — S0502XA Injury of conjunctiva and corneal abrasion without foreign body, left eye, initial encounter: Secondary | ICD-10-CM

## 2019-03-11 DIAGNOSIS — H18832 Recurrent erosion of cornea, left eye: Secondary | ICD-10-CM | POA: Diagnosis not present

## 2019-03-11 DIAGNOSIS — S0502XD Injury of conjunctiva and corneal abrasion without foreign body, left eye, subsequent encounter: Secondary | ICD-10-CM | POA: Diagnosis not present

## 2019-03-14 DIAGNOSIS — S0502XD Injury of conjunctiva and corneal abrasion without foreign body, left eye, subsequent encounter: Secondary | ICD-10-CM | POA: Diagnosis not present

## 2019-03-14 DIAGNOSIS — H18832 Recurrent erosion of cornea, left eye: Secondary | ICD-10-CM | POA: Diagnosis not present

## 2019-03-15 ENCOUNTER — Encounter: Payer: Self-pay | Admitting: Family Medicine

## 2019-03-15 ENCOUNTER — Ambulatory Visit: Payer: Self-pay

## 2019-03-15 ENCOUNTER — Other Ambulatory Visit: Payer: Self-pay

## 2019-03-15 ENCOUNTER — Ambulatory Visit (INDEPENDENT_AMBULATORY_CARE_PROVIDER_SITE_OTHER): Payer: 59 | Admitting: Family Medicine

## 2019-03-15 DIAGNOSIS — M25562 Pain in left knee: Secondary | ICD-10-CM | POA: Diagnosis not present

## 2019-03-15 NOTE — Progress Notes (Signed)
Office Visit Note   Patient: Denise Rice           Date of Birth: 09/22/1950           MRN: [id number] Visit Date: 03/15/2019 Requested by: [name], [place],  [place] [id number] PCP: [name], MD  Subjective: Chief Complaint  Patient presents with  . Left Knee - Follow-up    HPI: She is here for follow-up little more than 2 months status post fall resulting in left knee contusion.  Still having quite a bit of pain on the medial aspect, sharp stabbing pain when she tries to bend her knee.              ROS:   All other systems were reviewed and are negative.  Objective: Vital Signs: LMP 01/13/2005   Physical Exam:  General:  Alert and oriented, in no acute distress. Pulm:  Breathing unlabored. Psy:  Normal mood, congruent affect.  Trace effusion with no warmth.  Very tender on the medial joint line.  Flexion limited to about 90 degrees before pain increases.  No laxity with varus or valgus stress.  Imaging: X-rays left knee: No significant change since previous films.   Assessment & Plan: 1.  Little more than 2 months status post left knee contusion with persistent pain, question bone contusion versus medial meniscus tear -Discussed with patient and elected to inject with cortisone today.  If no improvement, then MRI scan.     Procedures: Left knee steroid injection: After sterile prep with Betadine, injected 5 cc 1% lidocaine without epinephrine and 40 mg methylprednisolone from medial midpatellar approach.    PMFS History: Patient Active Problem List   Diagnosis Date Noted  . Encounter for counseling 03/08/2019  . Lactose intolerance 12/22/2018  . Vitamin B12 deficiency 07/29/2018  . Anxiety 03/14/2018  . Elschnig bodies following cataract surgery, 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  . History of small bowel obstruction 11/17/2017  . Chronic vertigo 03/11/2017  . Major depression, chronic 03/11/2017  . PCOS (polycystic ovarian syndrome)  03/11/2017  . Insomnia 09/08/2015  . Bilateral sensorineural hearing loss 09/06/2014  . 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 History of laparoscopic adjustable gastric banding 01/22/2011  . Vitamin D deficiency 01/15/2008   Past Medical History:  Diagnosis Date  . Anal condyloma   . Anxiety   . Constipation   . Deafness in left ear   . Depression   . Fatigue   . Gallbladder problem   . Hearing loss, sensorineural, high frequency    RIGHT EAR  . 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 Last eruption 3/12  . History of peptic ulcer   . Infertility, female   . Lactose intolerance   . Otosclerosis of left ear   . Pancreatic disease   . PCO (polycystic ovaries)   . PCOS (polycystic ovarian syndrome) 03/11/2017  . PONV (postoperative nausea and vomiting)   . 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 Vertigo   . Vitamin B12 deficiency 07/29/2018   Started IM injections monthly 06/2018  . Vitamin D deficiency   . Wears hearing aid    both ears    Family History  Problem Relation Age of Onset  . Heart attack Mother   . Heart disease Mother   . Diabetes Mother   . Hypertension Mother   .  Hyperlipidemia Mother   . Stroke Mother   . Kidney disease Mother   . Thyroid disease Mother   . Anxiety disorder Mother   . Obesity Mother   . Cancer Father        unknown primary  . Diabetes Father   . Hypertension Father   . Hyperlipidemia Father   . Heart disease Father   . Stroke Father   . Kidney disease Father   . Obesity Father   . Stroke Sister   . Diabetes Sister   . Diabetes Maternal Aunt   . Heart disease Maternal Aunt   . Cancer Paternal Aunt   . Cancer Paternal Uncle   . Colon cancer Neg Hx   . Colon polyps Neg Hx     Past Surgical History:  Procedure Laterality Date  . BLEPHAROPLASTY  03-05-2000  . CRANIOTOMY  08/2014   to repair  dehescence in right ear  . HAMMER TOE SURGERY Left 08-29-2013   2nd toe  . IMPLANTATION BONE ANCHORED HEARING AID Left 07/27/2008   left temporal bone;now removed  . LAPAROSCOPIC CHOLECYSTECTOMY  03-31-2005  . LAPAROSCOPIC GASTRIC BANDING  02-08-2007  . LAPAROSCOPY N/A 11/17/2017   Procedure: LAPAROSCOPY LYSIS OF ADHESIONS FOR SMALL BOWEL OBSTRUCTION;  Surgeon: Excell Seltzer, MD;  Location: [place] [facility];  Service: General;  Laterality: N/A;  . LASER ABLATION CONDOLAMATA N/A 07/30/2012   Procedure: LASER ABLATION CONDOLAMATA;  Surgeon: Leighton Ruff, MD;  Location: [facility];  Service: General;  Laterality: N/A;  . LASIK Bilateral 2002  . LYSIS OF ADHESION N/A 11/17/2017   Procedure: LYSIS OF ADHESION;  Surgeon: Excell Seltzer, MD;  Location: [facility];  Service: General;  Laterality: N/A;  . ROUX-EN-Y GASTRIC BYPASS  AUG 2013  . [place];  1994 x2;  02-03-1998  . STRABISMUS SURGERY Right   . VAGINAL HYSTERECTOMY  2007   fibroids   Social History   Occupational History  . Occupation: Sports coach: [employer]    Comment: [employer]  Tobacco Use  . Smoking status: Never Smoker  . Smokeless tobacco: Never Used  Substance and Sexual Activity  . Alcohol use: No    Alcohol/week: 0.0 standard drinks  . Drug use: No  . Sexual activity: Yes    Birth control/protection: Surgical

## 2019-03-17 ENCOUNTER — Encounter: Payer: Self-pay | Admitting: Family Medicine

## 2019-03-17 DIAGNOSIS — M25562 Pain in left knee: Secondary | ICD-10-CM

## 2019-03-18 ENCOUNTER — Encounter: Payer: Self-pay | Admitting: Family Medicine

## 2019-03-18 MED FILL — traZODone HCL 50 MG TABS: 50 | 30 days supply | Qty: 30 | Fill #2

## 2019-03-21 ENCOUNTER — Encounter: Payer: Self-pay | Admitting: Family Medicine

## 2019-03-21 ENCOUNTER — Other Ambulatory Visit: Payer: Self-pay | Admitting: Family Medicine

## 2019-03-21 MED ORDER — TRAMADOL HCL 50 MG PO TABS: 50.0000 mg | ORAL_TABLET | Freq: Four times a day (QID) | ORAL | 0 refills | Status: DC | PRN

## 2019-03-21 MED FILL — traMADol HCL 50 MG TABS: 50 | 3 days supply | Qty: 15 | Fill #0

## 2019-03-21 NOTE — Progress Notes (Signed)
Patient request refill of tramadol. Last refill 02/15/2019. Will refill today.

## 2019-03-29 MED FILL — tiZANidine HCL 2 MG TABS: 2 | 8 days supply | Qty: 60 | Fill #1

## 2019-03-30 DIAGNOSIS — Z961 Presence of intraocular lens: Secondary | ICD-10-CM | POA: Diagnosis not present

## 2019-03-30 DIAGNOSIS — H16223 Keratoconjunctivitis sicca, not specified as Sjogren's, bilateral: Secondary | ICD-10-CM | POA: Diagnosis not present

## 2019-03-30 DIAGNOSIS — H0220C Unspecified lagophthalmos, bilateral, upper and lower eyelids: Secondary | ICD-10-CM | POA: Diagnosis not present

## 2019-03-30 DIAGNOSIS — D3132 Benign neoplasm of left choroid: Secondary | ICD-10-CM | POA: Diagnosis not present

## 2019-03-30 DIAGNOSIS — H18832 Recurrent erosion of cornea, left eye: Secondary | ICD-10-CM | POA: Diagnosis not present

## 2019-03-30 DIAGNOSIS — D3131 Benign neoplasm of right choroid: Secondary | ICD-10-CM | POA: Diagnosis not present

## 2019-04-01 ENCOUNTER — Telehealth: Payer: Self-pay | Admitting: Family Medicine

## 2019-04-01 ENCOUNTER — Encounter (INDEPENDENT_AMBULATORY_CARE_PROVIDER_SITE_OTHER): Payer: 59 | Admitting: Ophthalmology

## 2019-04-01 DIAGNOSIS — H31001 Unspecified chorioretinal scars, right eye: Secondary | ICD-10-CM

## 2019-04-01 DIAGNOSIS — D3132 Benign neoplasm of left choroid: Secondary | ICD-10-CM | POA: Diagnosis not present

## 2019-04-01 DIAGNOSIS — H43813 Vitreous degeneration, bilateral: Secondary | ICD-10-CM | POA: Diagnosis not present

## 2019-04-01 MED ORDER — DIAZEPAM 10 MG PO TABS: ORAL_TABLET | ORAL | 5 refills | Status: DC

## 2019-04-01 MED FILL — BUSPIRONE HCL 7.5 MG TABS: 7.5 | 30 days supply | Qty: 90 | Fill #2

## 2019-04-01 MED FILL — DIAZEPAM 10 MG TABS: 10 | 30 days supply | Qty: 60 | Fill #0

## 2019-04-01 NOTE — Telephone Encounter (Signed)
LAST APPOINTMENT DATE: @ 12/08/2018  NEXT APPOINTMENT DATE:@1 /06/2019   LAST REFILL: 12/08/2018  QTY: #60 with 5 rf

## 2019-04-01 NOTE — Telephone Encounter (Signed)
..   LAST APPOINTMENT DATE: 01/20/2019   NEXT APPOINTMENT DATE:@Visit  date not found  MEDICATION: diazepam 10mg   PH[facility]ACY:  [facility]   **Let patient know to contact pharmacy at the end of the day to make sure medication is ready. **  ** Please notify patient to allow 48-72 hours to process**  **Encourage patient to contact the pharmacy for refills or they can request refills through [facility]**  CLINICAL FILLS OUT ALL BELOW:   LAST REFILL:  QTY:  REFILL DATE:    OTHER COMMENTS:    Okay for refill?  Please advise

## 2019-04-04 NOTE — Telephone Encounter (Signed)
Patient is completely out of this medication and asked if someone will give her a call once it is sent to her pharmacy

## 2019-04-04 NOTE — Telephone Encounter (Signed)
Patient notified that Diazepam was sent to pharmacy on 04/01/2019

## 2019-04-05 ENCOUNTER — Encounter (INDEPENDENT_AMBULATORY_CARE_PROVIDER_SITE_OTHER): Payer: 59 | Admitting: Ophthalmology

## 2019-04-05 MED FILL — ZOLPIDEM TARTRATE 10 MG TAB: 10 | 30 days supply | Qty: 30 | Fill #4

## 2019-04-09 ENCOUNTER — Encounter: Payer: Self-pay | Admitting: Family Medicine

## 2019-04-16 ENCOUNTER — Other Ambulatory Visit: Payer: 59

## 2019-04-17 ENCOUNTER — Encounter: Payer: Self-pay | Admitting: Family Medicine

## 2019-04-18 MED ORDER — TRAMADOL HCL 50 MG PO TABS: 50.0000 mg | ORAL_TABLET | Freq: Four times a day (QID) | ORAL | 0 refills | Status: DC | PRN

## 2019-04-18 MED FILL — AJOVY 225 MG/1.5ML SOSY: 225 | 30 days supply | Qty: 2 | Fill #3

## 2019-04-18 MED FILL — traMADol HCL 50 MG TABS: 50 | 3 days supply | Qty: 15 | Fill #0

## 2019-04-18 MED FILL — traZODone HCL 50 MG TABS: 50 | 30 days supply | Qty: 30 | Fill #3

## 2019-04-18 MED FILL — tiZANidine HCL 2 MG TABS: 2 | 8 days supply | Qty: 60 | Fill #2

## 2019-04-18 MED FILL — CYANOCOBALAMIN 1,000 MCG/ML: 1000 | 30 days supply | Qty: 1 | Fill #10

## 2019-04-20 ENCOUNTER — Encounter: Payer: Self-pay | Admitting: Family Medicine

## 2019-04-20 ENCOUNTER — Other Ambulatory Visit: Payer: Self-pay

## 2019-04-20 ENCOUNTER — Ambulatory Visit: Payer: 59 | Admitting: Family Medicine

## 2019-04-20 DIAGNOSIS — M25562 Pain in left knee: Secondary | ICD-10-CM

## 2019-04-20 NOTE — Progress Notes (Signed)
Office Visit Note   Patient: Denise Rice           Date of Birth: January 18, 1950           MRN: [id number] Visit Date: 04/20/2019 Requested by: [name], [place],  [place] [address] PCP: [name], MD  Subjective: Chief Complaint  Patient presents with  . Left Knee - Pain    Requesting another cortisone injection. Knee is feeling much better after the first injection. Still a twinge of pain.    HPI: She is here for follow-up left knee pain.  She is dramatically better after getting cortisone injection, but it still bothers her to some degree and she would like to have 1 more injection to try to knock out the last bit of pain.              ROS:   All other systems were reviewed and are negative.  Objective: Vital Signs: LMP 01/13/2005   Physical Exam:  General:  Alert and oriented, in no acute distress. Pulm:  Breathing unlabored. Psy:  Normal mood, congruent affect. Skin: No erythema or warmth. Left knee: Trace effusion, good range of motion.    Imaging: None today  Assessment & Plan: 1.  Significantly improved left knee pain -1 more cortisone injection today.  Follow-up as needed.     Procedures: Left knee injection: After sterile prep with Betadine, injected 4 cc 1% lidocaine without epinephrine and 40 mg prednisolone from lateral midpatellar approach.    PMFS History: Patient Active Problem List   Diagnosis Date Noted  . Encounter for counseling 03/08/2019  . Lactose intolerance 12/22/2018  . Vitamin B12 deficiency 07/29/2018  . Anxiety 03/14/2018  . Elschnig bodies following cataract surgery, 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  . History of small bowel obstruction 11/17/2017  . Chronic vertigo 03/11/2017  . Major depression, chronic 03/11/2017  . PCOS (polycystic ovarian syndrome) 03/11/2017  . Insomnia 09/08/2015  . Bilateral sensorineural hearing loss 09/06/2014  . 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 S/P  gastric bypass -2014 06/25/2012  . History of laparoscopic adjustable gastric banding 01/22/2011  . Vitamin D deficiency 01/15/2008   Past Medical History:  Diagnosis Date  . Anal condyloma   . Anxiety   . Constipation   . Deafness in left ear   . Depression   . Fatigue   . Gallbladder problem   . Hearing loss, sensorineural, high frequency    RIGHT EAR  . 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 Last eruption 3/12  . History of peptic ulcer   . Infertility, female   . Lactose intolerance   . Otosclerosis of left ear   . Pancreatic disease   . PCO (polycystic ovaries)   . PCOS (polycystic ovarian syndrome) 03/11/2017  . PONV (postoperative nausea and vomiting)   . 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 Vertigo   . Vitamin B12 deficiency 07/29/2018   Started IM injections monthly 06/2018  . Vitamin D deficiency   . Wears hearing aid    both ears    Family History  Problem Relation Age of Onset  . Heart attack Mother   . Heart disease Mother   . Diabetes Mother   . Hypertension Mother   . Hyperlipidemia Mother   . Stroke Mother   . Kidney disease Mother   . Thyroid disease Mother   . Anxiety  disorder Mother   . Obesity Mother   . Cancer Father        unknown primary  . Diabetes Father   . Hypertension Father   . Hyperlipidemia Father   . Heart disease Father   . Stroke Father   . Kidney disease Father   . Obesity Father   . Stroke Sister   . Diabetes Sister   . Diabetes Maternal Aunt   . Heart disease Maternal Aunt   . Cancer Paternal Aunt   . Cancer Paternal Uncle   . Colon cancer Neg Hx   . Colon polyps Neg Hx     Past Surgical History:  Procedure Laterality Date  . BLEPHAROPLASTY  03-05-2000  . CRANIOTOMY  08/2014   to repair dehescence in right ear  . HAMMER TOE SURGERY Left 08-29-2013   2nd toe  . IMPLANTATION BONE ANCHORED HEARING AID Left 07/27/2008   left temporal bone;now removed    . LAPAROSCOPIC CHOLECYSTECTOMY  03-31-2005  . LAPAROSCOPIC GASTRIC BANDING  02-08-2007  . LAPAROSCOPY N/A 11/17/2017   Procedure: LAPAROSCOPY LYSIS OF ADHESIONS FOR SMALL BOWEL OBSTRUCTION;  Surgeon: Excell Seltzer, MD;  Location: [place];  Service: General;  Laterality: N/A;  . LASER ABLATION CONDOLAMATA N/A 07/30/2012   Procedure: LASER ABLATION CONDOLAMATA;  Surgeon: Leighton Ruff, MD;  Location: [facility];  Service: General;  Laterality: N/A;  . LASIK Bilateral 2002  . LYSIS OF ADHESION N/A 11/17/2017   Procedure: LYSIS OF ADHESION;  Surgeon: Excell Seltzer, MD;  Location: [facility] [place];  Service: General;  Laterality: N/A;  . ROUX-EN-Y GASTRIC BYPASS  AUG 2013  . [place];  1994 x2;  02-03-1998  . STRABISMUS SURGERY Right   . VAGINAL HYSTERECTOMY  2007   fibroids   Social History   Occupational History  . 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 Smoking status: Never Smoker  . Smokeless tobacco: Never Used  Substance and Sexual Activity  . Alcohol use: No    Alcohol/week: 0.0 standard drinks  . Drug use: No  . Sexual activity: Yes    Birth control/protection: Surgical

## 2019-05-02 MED FILL — DIAZEPAM 10 MG TABS: 10 | 30 days supply | Qty: 60 | Fill #1

## 2019-05-03 ENCOUNTER — Ambulatory Visit (INDEPENDENT_AMBULATORY_CARE_PROVIDER_SITE_OTHER): Payer: Self-pay | Admitting: Plastic Surgery

## 2019-05-03 ENCOUNTER — Encounter: Payer: Self-pay | Admitting: Plastic Surgery

## 2019-05-03 ENCOUNTER — Other Ambulatory Visit: Payer: Self-pay

## 2019-05-03 DIAGNOSIS — Z719 Counseling, unspecified: Secondary | ICD-10-CM

## 2019-05-03 MED ORDER — LIDOCAINE-PRILOCAINE 2.5-2.5 % EX CREA: 1.0000 "application " | TOPICAL_CREAM | CUTANEOUS | 0 refills | Status: DC | PRN

## 2019-05-03 MED ORDER — HYDROCODONE-ACETAMINOPHEN 5-325 MG PO TABS: 1.0000 | ORAL_TABLET | Freq: Two times a day (BID) | ORAL | 0 refills | Status: AC | PRN

## 2019-05-03 MED ORDER — AMOXICILLIN-POT CLAVULANATE 500-125 MG PO TABS: 1.0000 | ORAL_TABLET | Freq: Three times a day (TID) | ORAL | 0 refills | Status: AC

## 2019-05-03 MED ORDER — ONDANSETRON HCL 4 MG PO TABS: 4.0000 mg | ORAL_TABLET | Freq: Three times a day (TID) | ORAL | 0 refills | Status: AC | PRN

## 2019-05-03 MED FILL — LIDOCAINE-PRILOCAINE CREAM: 2.5-2.5 | 30 days supply | Qty: 30 | Fill #0

## 2019-05-03 MED FILL — HYDROCODON-APAP 5-325: 5-325 | 3 days supply | Qty: 6 | Fill #0

## 2019-05-03 MED FILL — AMOX-CLAV 500-125 MG TABLET: 500-125 | 3 days supply | Qty: 9 | Fill #0

## 2019-05-03 MED FILL — ONDANSETRON HCL 4 MG TABS: 4 | 5 days supply | Qty: 15 | Fill #0

## 2019-05-03 NOTE — Progress Notes (Signed)
   Subjective:    Patient ID: Denise Rice, female    DOB: [birth year], 69 y.o.   MRN: [id number]  The patient is a 69 year old female joining me by a telemetry visit for further discussion regarding her procedure for Friday.  She is excited about the surgery.  She is not had any recent illnesses or infections.  She has a little bit of family stress with her husband's health but otherwise has good support with her daughter.  We have reviewed her previous surgeries and medications.  We will further review her expectations in person.     Review of Systems  Constitutional: Negative.   HENT: Negative.   Eyes: Negative.   Respiratory: Negative.   Cardiovascular: Negative.   Endocrine: Negative.   Genitourinary: Negative.   Musculoskeletal: Negative.        Objective:   Physical Exam Neurological:     Mental Status: She is alert.       Assessment & Plan:     ICD-10-CM   1. Encounter for counseling  Z71.9     Will obtain pictures and review information on Friday.  In the meantime I have called in her medications so she can pick them up ahead of time.  She will put EMLA cream on her face 30 minutes prior to the procedure.

## 2019-05-04 ENCOUNTER — Telehealth (INDEPENDENT_AMBULATORY_CARE_PROVIDER_SITE_OTHER): Payer: 59 | Admitting: Family Medicine

## 2019-05-04 ENCOUNTER — Encounter: Payer: Self-pay | Admitting: Family Medicine

## 2019-05-04 ENCOUNTER — Other Ambulatory Visit: Payer: Self-pay

## 2019-05-04 DIAGNOSIS — F430 Acute stress reaction: Secondary | ICD-10-CM | POA: Diagnosis not present

## 2019-05-04 DIAGNOSIS — G43709 Chronic migraine without aura, not intractable, without status migrainosus: Secondary | ICD-10-CM | POA: Insufficient documentation

## 2019-05-04 DIAGNOSIS — F329 Major depressive disorder, single episode, unspecified: Secondary | ICD-10-CM | POA: Diagnosis not present

## 2019-05-04 DIAGNOSIS — Z8669 Personal history of other diseases of the nervous system and sense organs: Secondary | ICD-10-CM | POA: Insufficient documentation

## 2019-05-04 DIAGNOSIS — IMO0002 Reserved for concepts with insufficient information to code with codable children: Secondary | ICD-10-CM

## 2019-05-04 NOTE — Progress Notes (Signed)
Virtual Visit via Video Note  SUBJECTIVE CC:  Chief Complaint  Patient presents with  . Anxiety    states that she has alot going on personally and with work, husband recently went on dialysis, states that she has seen therapist in the past- but has no interest in talking to a stranger about her problems  . Headache    AJOVY monthly and tramadol, believes this to be stress headaches    I connected with [name] on 05/04/19 at 11:30 AM EDT by a video enabled telemedicine application and verified that I am speaking with the correct person using two identifiers. Location patient: Home Location provider: [facility] participating in the virtual visit: Eryka Dolinger, [name], MD Serita Sheller, [place] discussed the limitations of evaluation and management by telemedicine and the availability of in person appointments. The patient expressed understanding and agreed to proceed.  HPI: Denise Rice is a 69 y.o. female who was contacted today to address the problems listed above in the chief complaint/mood.  Unfortunately, not doing well. Lost job last week; frustrated and sad. Feels betrayed. Worries about future. As well, husband now on dialysis. Sxs: not sleeping, angry, anxious and sad. On meds for anxiety and sleep. Had been working well.  Depression screen [facility] 2/9 05/04/2019 08/03/2018 03/08/2018  Decreased Interest 1 2 1   Down, Depressed, Hopeless 3 1 2   PHQ - 2 Score 4 3 3   Altered sleeping 3 2 2   Tired, decreased energy 3 2 1   Change in appetite 0 2 1  Feeling bad or failure about yourself  3 2 2   Trouble concentrating 3 2 1   Moving slowly or fidgety/restless 1 0 1  Suicidal thoughts 0 0 0  PHQ-9 Score 17 13 11   Difficult doing work/chores Extremely dIfficult Somewhat difficult Somewhat difficult  Some recent data might be hidden   GAD 7 : Generalized Anxiety Score 05/04/2019 06/23/2018 03/08/2018  Nervous, Anxious, on Edge 3 2 3     Control/stop worrying 3 1 2   Worry too much - different things 3 2 3   Trouble relaxing 3 3 3   Restless 3 3 0  Easily annoyed or irritable 0 0 1  Afraid - awful might happen 3 3 3   Total GAD 7 Score 18 14 15   Anxiety Difficulty Extremely difficult Not difficult at all Somewhat difficult     ASSESSMENT 1. Stress reaction   2. Chronic migraine   3. Major depression, chronic      Stress reaction:  Appropriate reaction. Counseling done. No new medications but can use 15mg  buspar prn if needed. Recheck 3-4 weeks.   Visit was 30 minutes in length; counseling and discussing mgt strategies.   I discussed the assessment and treatment plan with the patient. The patient was provided an opportunity to ask questions and all were answered. The patient agreed with the plan and demonstrated an understanding of the instructions.   The patient was advised to call back or seek an in-person evaluation if the symptoms worsen or if the condition fails to improve as anticipated. Follow up: No follow-ups on file.  Visit date not found  No orders of the defined types were placed in this encounter.     I reviewed the patients updated PMH, FH, and SocHx.    Patient Active Problem List   Diagnosis Date Noted  . Bilateral sensorineural hearing loss 09/06/2014    Priority: High  . Chronic  migraine 05/04/2019    Priority: Medium  . History of small bowel obstruction 11/17/2017    Priority: Medium  . Chronic vertigo 03/11/2017    Priority: Medium  . Major depression, chronic 03/11/2017    Priority: Medium  . PCOS (polycystic ovarian syndrome) 03/11/2017    Priority: Medium  . Insomnia 09/08/2015    Priority: Medium  . 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 History of laparoscopic adjustable gastric banding 01/22/2011    Priority: Medium  . Lactose intolerance 12/22/2018    Priority: Low  . 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 Vitamin D deficiency 01/15/2008    Priority: Low  . Vitamin B12 deficiency 07/29/2018  . Anxiety 03/14/2018  . Elschnig bodies following cataract surgery, 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   Current Meds  Medication Sig  . AJOVY 225 MG/1.5ML SOSY INJECT 225 MG INTO THE SKIN EVERY 30 DAYS  . amoxicillin-clavulanate (AUGMENTIN) 500-125 MG tablet Take 1 tablet (500 mg total) by mouth 3 (three) times daily for 3 days.  . Biotin 5000 MCG CAPS Take 5,000 mcg by mouth daily.   . busPIRone (BUSPAR) 7.5 MG tablet TAKE 1 TABLET BY MOUTH THREE TIMES DAILY AS NEEDED FOR ANXIETY  . cholecalciferol (VITAMIN D3) 25 MCG (1000 UT) tablet Take 1,000 Units by mouth daily.  . cyanocobalamin (,VITAMIN B-12,) 1000 MCG/ML injection Inject 1 mL (1,000 mcg total) into the muscle every 30 (thirty) days.  . cyanocobalamin 2000 MCG tablet Take 2,000 mcg by mouth daily.  . diazepam (VALIUM) 10 MG tablet TAKE 1 TABLET (10 MG TOTAL) BY MOUTH 2 (TWO) TIMES A DAY.  Marland Kitchen escitalopram (LEXAPRO) 20 MG tablet TAKE 1 TABLET BY MOUTH DAILY.  . ferrous sulfate 325 (65 FE) MG tablet Take 325 mg by mouth daily with breakfast.  . HYDROcodone-acetaminophen (NORCO) 5-325 MG tablet Take 1 tablet by mouth every 12 (twelve) hours as needed for up to 3 days for severe pain.  . hydrOXYzine (ATARAX/VISTARIL) 25 MG tablet TAKE 1 TABLET BY MOUTH TWO TIMES DAILY AS NEEDED  . lidocaine-prilocaine (EMLA) cream Apply 1 application topically as needed. Apply to face 30 minutes prior to procedure  . Multiple Vitamin (MULTIVITAMIN WITH MINERALS) TABS tablet Take 1 tablet by mouth daily.  Marland Kitchen ofloxacin (OCUFLOX) 0.3 % ophthalmic solution Place 1 drop into the left eye 4 (four) times daily.  . ondansetron (ZOFRAN) 4 MG tablet Take 1 tablet (4 mg total) by mouth every 8 (eight) hours as needed for up to 5 days.  Vladimir Faster Glycol-Propyl Glycol (SYSTANE OP) Apply to eye daily.  . Syringe/Needle, Disp, (SYRINGE 3CC/25GX1") 25G X 1" 3 ML MISC Use once monthly for  injection of Viatmin B-12  . tiZANidine (ZANAFLEX) 2 MG tablet Take 1-2 tablets (2-4 mg total) by mouth every 6 (six) hours as needed for muscle spasms.  . traMADol (ULTRAM) 50 MG tablet Take 1 tablet (50 mg total) by mouth every 6 (six) hours as needed.  . traZODone (DESYREL) 50 MG tablet Take 0.5-1 tablets (25-50 mg total) by mouth at bedtime as needed for sleep.    Allergies: Patient is allergic to topiramate. Family History: Patient family history includes Anxiety disorder in her mother; Cancer in her father, paternal aunt, and paternal uncle; Diabetes in her father, maternal aunt, mother, and sister; Heart attack in her mother; Heart disease in her father, maternal aunt, and mother; Hyperlipidemia in her father and mother; Hypertension in her  father and mother; Kidney disease in her father and mother; Obesity in her father and mother; Stroke in her father, mother, and sister; Thyroid disease in her mother. Social History:  Patient  reports that she has never smoked. She has never used smokeless tobacco. She reports that she does not drink alcohol or use drugs.  Review of Systems: Constitutional: Negative for fever malaise or anorexia Cardiovascular: negative for chest pain Respiratory: negative for SOB or persistent cough Gastrointestinal: negative for abdominal pain  OBJECTIVE/OBSERVATIONS: General: no acute distress,  well groomed Psych:  Alert and oriented x 3,sad appearing.   [name], MD

## 2019-05-06 ENCOUNTER — Encounter: Payer: Self-pay | Admitting: Plastic Surgery

## 2019-05-06 ENCOUNTER — Other Ambulatory Visit: Payer: Self-pay

## 2019-05-06 ENCOUNTER — Ambulatory Visit (INDEPENDENT_AMBULATORY_CARE_PROVIDER_SITE_OTHER): Payer: Self-pay | Admitting: Plastic Surgery

## 2019-05-06 VITALS — BP 157/99 | HR 73 | Temp 98.2°F | Ht 61.5 in | Wt 157.0 lb

## 2019-05-06 DIAGNOSIS — Z719 Counseling, unspecified: Secondary | ICD-10-CM

## 2019-05-07 MED ORDER — OXYCODONE HCL 5 MG PO CAPS: 5.0000 mg | ORAL_CAPSULE | Freq: Four times a day (QID) | ORAL | 0 refills | Status: AC | PRN

## 2019-05-07 MED FILL — oxyCODONE HCL 5 MG TABS: 5 | 5 days supply | Qty: 20 | Fill #0

## 2019-05-09 ENCOUNTER — Encounter: Payer: Self-pay | Admitting: Plastic Surgery

## 2019-05-09 NOTE — Progress Notes (Signed)
Operative Report  Preoperative Diagnosis: Facial Rhytids   Postoperative Diagnosis: Same  Procedures: face lift  Surgeon: Theodoro Kos Esley Brooking  Assistant: [place], RNFA  EBL: Minimal  Complications: None  Procedure in detail: Patient was seen prior to surgery. She was taken to the procedure room. She was marked out and the surgical procedure reviewed. Patient was then prepped and draped in the standard sterile fashion using a betadine prep.  The local anesthetic mixture was then injected into the right face subcutaneous tissue.  The needle tip Bovie was used to make the incision.  Care was taken to keep that at the edge of the hairline in front of the ear.  It was then followed down in front of the ear and tragus to be hidden.  Went down beyond the lobule and then at the postauricular sulcus.  Then at the most narrow juncture behind the ear it was taken down and brought just in front of the hairline.  After the incision was made dissection was carried in the facelift plane to midway to the lateral canthus and mid cheek.  The area included the mandibular line and beyond. Hemostasis was obtained throughout the procedure using the Bovie.  Care was taken to assure no nerve activity.  This was continued to the postauricular area so that the skin could be worked in. The SMAS was was plicated with 3-0 Vicryl.  This was done at the anterior cheek area and at the jawline.  The flap was then pulled to the superior preauricular corner and tacked with the 3-0 Monocryl.  The area to be excised was then marked and included 3 cm area beyond the lobule.  The widest amount was 2.5 cm.  The skin was tacked to the hairline and preauricular area with a combination of simple interrupted and running sutures  Attention was turned to the left side of the face. The local anesthetic mixture was then injected into the right face subcutaneous tissue.  The needle tip Bovie was used to make the incision.  Care was  taken to keep that at the edge of the hairline in front of the ear.  It was then followed down in front of the ear and tragus to be hidden.  Went down beyond the lobule and then at the postauricular sulcus.  Then at the most narrow juncture behind the ear it was taken down and brought just in front of the hairline.  After the incision was made dissection was carried in the facelift plane to midway to the lateral canthus and mid cheek.  The area included the mandibular line and beyond. Hemostasis was obtained throughout the procedure using the Bovie.  Care was taken to assure no nerve activity.  This was continued to the postauricular area so that the skin could be worked in. The SMAS was was plicated with 3-0 Vicryl.  This was done at the anterior cheek area and at the jawline.  The flap was then pulled to the superior preauricular corner and tacked with the 3-0 Monocryl.  The area to be excised was then marked and included 3 cm area beyond the lobule.  The widest amount was 2.5 cm.  The skin was tacked to the hairline and preauricular area with a combination of simple interrupted and running sutures   Patient was then washed off and put into a protective head wrap.  She was then allowed to wake up and was taken to recovery in stable condition. Her husband was notified at the  end of the case.  She had full movement of her cranial nerves as tested.

## 2019-05-12 ENCOUNTER — Encounter: Payer: Self-pay | Admitting: Plastic Surgery

## 2019-05-12 ENCOUNTER — Ambulatory Visit (INDEPENDENT_AMBULATORY_CARE_PROVIDER_SITE_OTHER): Payer: Self-pay | Admitting: Plastic Surgery

## 2019-05-12 ENCOUNTER — Other Ambulatory Visit: Payer: Self-pay

## 2019-05-12 DIAGNOSIS — Z719 Counseling, unspecified: Secondary | ICD-10-CM

## 2019-05-12 NOTE — Progress Notes (Signed)
The patient is doing very well.  She has bruising in the neck and chin area.  Some bruising at the lateral cheeks as well.  It is as expected for this time frame.  No sign of hematoma or seroma.  No sign of infection.  The incisions are healing. I would like to see her back in one week.

## 2019-05-16 ENCOUNTER — Other Ambulatory Visit: Payer: Self-pay | Admitting: Family Medicine

## 2019-05-16 ENCOUNTER — Other Ambulatory Visit: Payer: Self-pay | Admitting: Neurology

## 2019-05-16 MED FILL — tiZANidine HCL 2 MG TABS: 2 | 8 days supply | Qty: 60 | Fill #3

## 2019-05-16 MED FILL — AJOVY 225 MG/1.5ML SOSY: 225 | 30 days supply | Qty: 2 | Fill #4

## 2019-05-16 MED FILL — traZODone HCL 50 MG TABS: 50 | 30 days supply | Qty: 30 | Fill #0

## 2019-05-16 MED FILL — CYANOCOBALAMIN 1,000 MCG/ML: 1000 | 30 days supply | Qty: 1 | Fill #11

## 2019-05-16 MED FILL — ZOLPIDEM TARTRATE 10 MG TAB: 10 | 30 days supply | Qty: 30 | Fill #5

## 2019-05-17 ENCOUNTER — Other Ambulatory Visit: Payer: Self-pay

## 2019-05-17 MED FILL — traMADol HCL 50 MG TABS: 50 | 3 days supply | Qty: 15 | Fill #0

## 2019-05-17 NOTE — Telephone Encounter (Signed)
Pt is up to date on her appts. Pt is due for a refill on tramadol.  [place] Controlled Substance Registry checked.  Pt was prescribed oxycodone 5mg  on 05/07/2019 by Dr. Marla Roe. Pt was also prescribed hydrocodone-acetaminophen 5-325mg  on 05/03/2019 by Dr. Marla Roe.

## 2019-05-19 MED FILL — hydrOXYzine HCL 25 MG TABS: 25 | 90 days supply | Qty: 180 | Fill #1

## 2019-05-20 ENCOUNTER — Ambulatory Visit (INDEPENDENT_AMBULATORY_CARE_PROVIDER_SITE_OTHER): Payer: Self-pay | Admitting: Plastic Surgery

## 2019-05-20 ENCOUNTER — Encounter: Payer: Self-pay | Admitting: Plastic Surgery

## 2019-05-20 ENCOUNTER — Other Ambulatory Visit: Payer: Self-pay

## 2019-05-20 VITALS — BP 147/84 | HR 66 | Temp 98.0°F | Ht 61.0 in | Wt 152.0 lb

## 2019-05-20 DIAGNOSIS — Z719 Counseling, unspecified: Secondary | ICD-10-CM

## 2019-05-20 NOTE — Progress Notes (Signed)
   Subjective:    Patient ID: Denise Rice, female    DOB: 1950/09/03, 70 y.o.   MRN: [id number]  The patient is a 69 year old female here for follow-up on her mini facelift.  Overall she is doing extremely well.  The incisions are healing nicely.  There is a little bit of skin scabbing on the left postauricular area.  Her bruising of the neck has gotten much better.  She has been using the Arnica and is overall pleased.  She still has a little bit of numbness.     Review of Systems  Constitutional: Negative.   HENT: Negative.   Eyes: Negative.   Respiratory: Negative.   Gastrointestinal: Negative.   Endocrine: Negative.   Genitourinary: Negative.   Musculoskeletal: Negative.   Skin: Positive for color change.       Objective:   Physical Exam Vitals and nursing note reviewed.  Constitutional:      Appearance: Normal appearance.  HENT:     Head: Normocephalic and atraumatic.  Cardiovascular:     Rate and Rhythm: Normal rate.     Pulses: Normal pulses.  Pulmonary:     Effort: Pulmonary effort is normal.  Neurological:     Mental Status: She is alert. Mental status is at baseline.  Psychiatric:        Mood and Affect: Mood normal.        Behavior: Behavior normal.        Assessment & Plan:     ICD-10-CM   1. Encounter for counseling  Z71.9     Continue with Vaseline to the incision areas.  I would like to see her back in 1 week.  Call with any questions or concerns.

## 2019-05-22 ENCOUNTER — Encounter: Payer: Self-pay | Admitting: Family Medicine

## 2019-05-22 DIAGNOSIS — M25562 Pain in left knee: Secondary | ICD-10-CM

## 2019-05-24 ENCOUNTER — Encounter: Payer: Self-pay | Admitting: Family Medicine

## 2019-05-24 NOTE — Telephone Encounter (Signed)
Please schedule appointment for patient.  Thank You

## 2019-05-27 ENCOUNTER — Encounter: Payer: Self-pay | Admitting: Plastic Surgery

## 2019-05-27 ENCOUNTER — Other Ambulatory Visit: Payer: Self-pay

## 2019-05-27 ENCOUNTER — Ambulatory Visit (INDEPENDENT_AMBULATORY_CARE_PROVIDER_SITE_OTHER): Payer: Self-pay | Admitting: Plastic Surgery

## 2019-05-27 VITALS — BP 124/72 | HR 71 | Temp 97.5°F | Ht 61.0 in | Wt 152.0 lb

## 2019-05-27 DIAGNOSIS — Z719 Counseling, unspecified: Secondary | ICD-10-CM

## 2019-05-27 NOTE — Progress Notes (Signed)
The patient is a 69 year old female here for follow-up after undergoing her facelift procedure.  She is doing much better.  The swelling has improved.  She still has a little bit in the right preauricular area.  She also has some bruising still in the chin area.  She has a little bit of skin loss on the left postauricular area.  This is improving.  There is no sign of infection.  Some of the sutures were removed.  I like to see her back in 1 week.

## 2019-05-31 MED FILL — DIAZEPAM 10 MG TABS: 10 | 30 days supply | Qty: 60 | Fill #2

## 2019-06-03 ENCOUNTER — Ambulatory Visit (INDEPENDENT_AMBULATORY_CARE_PROVIDER_SITE_OTHER): Payer: Self-pay | Admitting: Plastic Surgery

## 2019-06-03 ENCOUNTER — Other Ambulatory Visit: Payer: Self-pay

## 2019-06-03 ENCOUNTER — Encounter: Payer: Self-pay | Admitting: Plastic Surgery

## 2019-06-03 VITALS — BP 107/66 | HR 77 | Temp 98.2°F | Ht 61.0 in | Wt 152.0 lb

## 2019-06-03 DIAGNOSIS — Z719 Counseling, unspecified: Secondary | ICD-10-CM

## 2019-06-03 NOTE — Progress Notes (Signed)
The patient is a 69 year old female here for follow-up on her in the office facelift.  Overall she is doing well.  The bruising has subsided.  The swelling is improving.  She has a little bit of ropiness that is palpable on the right side of the face.  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  There is a little bit of declaration of breakdown on the left postauricular area.  This is improving.  I was able to get quite a bit of the scab off.  There is no sign of infection.  I would like to see her back in 7 to 10 days.

## 2019-06-04 ENCOUNTER — Encounter: Payer: Self-pay | Admitting: Family Medicine

## 2019-06-04 ENCOUNTER — Other Ambulatory Visit: Payer: Self-pay | Admitting: Plastic Surgery

## 2019-06-04 MED FILL — tiZANidine HCL 2 MG TABS: 2 | 8 days supply | Qty: 60 | Fill #1

## 2019-06-10 ENCOUNTER — Encounter: Payer: Self-pay | Admitting: Plastic Surgery

## 2019-06-10 ENCOUNTER — Other Ambulatory Visit: Payer: Self-pay

## 2019-06-10 ENCOUNTER — Ambulatory Visit (INDEPENDENT_AMBULATORY_CARE_PROVIDER_SITE_OTHER): Payer: 59 | Admitting: Plastic Surgery

## 2019-06-10 VITALS — BP 142/83 | HR 80 | Temp 98.0°F | Ht 61.0 in | Wt 152.0 lb

## 2019-06-10 DIAGNOSIS — Z719 Counseling, unspecified: Secondary | ICD-10-CM

## 2019-06-10 NOTE — Progress Notes (Signed)
The patient is a 69 year old female here for follow-up after undergoing a facelift.  Overall she is doing much better.  The bruising is resolving very nicely swelling but it is improving.  The posterior left auricular area is healing very nicely.  She has been using Vaseline.  I would like her to continue with the Vaseline on the left postauricular area.  On everything else she can use the skin above.  I would like to see her back in 2 weeks.  She might be a good candidate for laser in the next few months.

## 2019-06-14 ENCOUNTER — Other Ambulatory Visit: Payer: Self-pay | Admitting: Family Medicine

## 2019-06-14 ENCOUNTER — Encounter: Payer: Self-pay | Admitting: Family Medicine

## 2019-06-14 MED ORDER — TRAMADOL HCL 50 MG PO TABS: 50.0000 mg | ORAL_TABLET | Freq: Four times a day (QID) | ORAL | 0 refills | Status: DC | PRN

## 2019-06-14 MED FILL — tiZANidine HCL 2 MG TABS: 2 | 7 days supply | Qty: 60 | Fill #0

## 2019-06-14 MED FILL — CYANOCOBALAMIN 1,000 MCG/ML: 1000 | 30 days supply | Qty: 1 | Fill #12

## 2019-06-14 MED FILL — BUSPIRONE HCL 7.5 MG TABS: 7.5 | 30 days supply | Qty: 90 | Fill #3

## 2019-06-14 MED FILL — AJOVY 225 MG/1.5ML SOSY: 225 | 30 days supply | Qty: 2 | Fill #5

## 2019-06-14 MED FILL — traZODone HCL 50 MG TABS: 50 | 30 days supply | Qty: 30 | Fill #1

## 2019-06-14 MED FILL — traMADol HCL 50 MG TABS: 50 | 3 days supply | Qty: 15 | Fill #0

## 2019-06-16 ENCOUNTER — Telehealth: Payer: Self-pay | Admitting: Family Medicine

## 2019-06-16 ENCOUNTER — Ambulatory Visit
Admission: RE | Admit: 2019-06-16 | Discharge: 2019-06-16 | Disposition: A | Discharge status: Home / Self Care | Payer: 59 | Source: Ambulatory Visit | Attending: Family Medicine | Admitting: Family Medicine

## 2019-06-16 ENCOUNTER — Other Ambulatory Visit: Payer: Self-pay

## 2019-06-16 DIAGNOSIS — M1712 Unilateral primary osteoarthritis, left knee: Secondary | ICD-10-CM | POA: Diagnosis not present

## 2019-06-16 DIAGNOSIS — M25562 Pain in left knee: Secondary | ICD-10-CM

## 2019-06-16 NOTE — Telephone Encounter (Signed)
MRI shows arthritis.  No meniscus tears or fractures.  If pain continues, we could get approval for gel injections.

## 2019-06-18 ENCOUNTER — Other Ambulatory Visit: Payer: 59

## 2019-06-21 ENCOUNTER — Telehealth: Payer: Self-pay

## 2019-06-21 NOTE — Telephone Encounter (Signed)
Submitted VOB for Durolane, left knee.

## 2019-06-22 ENCOUNTER — Telehealth: Payer: Self-pay

## 2019-06-22 NOTE — Telephone Encounter (Signed)
Talked with patient and she is aware that she is approved for gel injection.  Approved, Durolane, left knee. Buy & Bill Must meet deductible first Patient will be responsible for 20% OOP. No Co-pay No PA required

## 2019-06-24 ENCOUNTER — Ambulatory Visit: Payer: 59 | Admitting: Plastic Surgery

## 2019-06-25 ENCOUNTER — Other Ambulatory Visit: Payer: Self-pay | Admitting: Family Medicine

## 2019-06-28 ENCOUNTER — Ambulatory Visit (INDEPENDENT_AMBULATORY_CARE_PROVIDER_SITE_OTHER): Payer: 59 | Admitting: Family Medicine

## 2019-06-28 ENCOUNTER — Ambulatory Visit: Payer: Self-pay

## 2019-06-28 ENCOUNTER — Other Ambulatory Visit: Payer: Self-pay

## 2019-06-28 ENCOUNTER — Encounter: Payer: Self-pay | Admitting: Family Medicine

## 2019-06-28 DIAGNOSIS — M1712 Unilateral primary osteoarthritis, left knee: Secondary | ICD-10-CM

## 2019-06-28 DIAGNOSIS — M25562 Pain in left knee: Secondary | ICD-10-CM

## 2019-06-28 MED FILL — DIAZEPAM 10 MG TABS: 10 | 30 days supply | Qty: 60 | Fill #3

## 2019-06-28 NOTE — Progress Notes (Signed)
Office Visit Note   Patient: Denise Rice           Date of Birth: [birth year]           MRN: [id number] Visit Date: 06/28/2019 Requested by: [name], [place],  [place] [address] PCP: [name], MD  Subjective: Chief Complaint  Patient presents with  . Left Knee - Pain    Says pain is on medial side of knee     HPI: She is here for a planned left knee Durolane injection for osteoarthritis.  No change in symptoms, pain on the medial aspect of her knee.              ROS:   All other systems were reviewed and are negative.  Objective: Vital Signs: LMP 01/13/2005   Physical Exam:  General:  Alert and oriented, in no acute distress. Pulm:  Breathing unlabored. Psy:  Normal mood, congruent affect.  Left knee: Very tender on the medial joint line.  No significant effusion today.  Imaging: US Guided Needle Placement - No Linked Charges  Result Date: 06/28/2019 Ultrasound-guided left knee injection: After sterile prep with Betadine, injected 5 cc 1% lidocaine without epinephrine and Durolane from medial approach into the midpatellar joint recess.   Assessment & Plan: 1.  Left knee DJD -Durolane injection given as above.  Follow-up as needed.     Procedures: No procedures performed  No notes on file     PMFS History: Patient Active Problem List   Diagnosis Date Noted  . Chronic migraine 05/04/2019  . Encounter for counseling 03/08/2019  . Lactose intolerance 12/22/2018  . Vitamin B12 deficiency 07/29/2018  . Anxiety 03/14/2018  . Elschnig bodies following cataract surgery, 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  . History of small bowel obstruction 11/17/2017  . Chronic vertigo 03/11/2017  . Major depression, chronic 03/11/2017  . PCOS (polycystic ovarian syndrome) 03/11/2017  . Insomnia 09/08/2015  . Bilateral sensorineural hearing loss 09/06/2014  . 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 History of laparoscopic adjustable gastric banding 01/22/2011  . Vitamin D deficiency 01/15/2008   Past Medical History:  Diagnosis Date  . Anal condyloma   . Anxiety   . Constipation   . Deafness in left ear   . Depression   . Fatigue   . Gallbladder problem   . Hearing loss, sensorineural, high frequency    RIGHT EAR  . 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 Last eruption 3/12  . History of peptic ulcer   . Infertility, female   . Lactose intolerance   . Otosclerosis of left ear   . Pancreatic disease   . PCO (polycystic ovaries)   . PCOS (polycystic ovarian syndrome) 03/11/2017  . PONV (postoperative nausea and vomiting)   . 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 Vertigo   . Vitamin B12 deficiency 07/29/2018   Started IM injections monthly 06/2018  . Vitamin D deficiency   . Wears hearing aid    both ears    Family History  Problem Relation Age of Onset  . Heart attack Mother   . Heart disease Mother   . Diabetes Mother   . Hypertension Mother   . Hyperlipidemia Mother   . Stroke Mother   . Kidney disease Mother   . Thyroid disease Mother   . Anxiety disorder Mother   .  Obesity Mother   . Cancer Father        unknown primary  . Diabetes Father   . Hypertension Father   . Hyperlipidemia Father   . Heart disease Father   . Stroke Father   . Kidney disease Father   . Obesity Father   . Stroke Sister   . Diabetes Sister   . Diabetes Maternal Aunt   . Heart disease Maternal Aunt   . Cancer Paternal Aunt   . Cancer Paternal Uncle   . Colon cancer Neg Hx   . Colon polyps Neg Hx     Past Surgical History:  Procedure Laterality Date  . BLEPHAROPLASTY  03-05-2000  . CRANIOTOMY  08/2014   to repair dehescence in right ear  . HAMMER TOE SURGERY Left 08-29-2013   2nd toe  . IMPLANTATION BONE ANCHORED HEARING AID Left 07/27/2008   left temporal bone;now removed  . LAPAROSCOPIC  CHOLECYSTECTOMY  03-31-2005  . LAPAROSCOPIC GASTRIC BANDING  02-08-2007  . LAPAROSCOPY N/A 11/17/2017   Procedure: LAPAROSCOPY LYSIS OF ADHESIONS FOR SMALL BOWEL OBSTRUCTION;  Surgeon: Excell Seltzer, MD;  Location: [facility];  Service: General;  Laterality: N/A;  . LASER ABLATION CONDOLAMATA N/A 07/30/2012   Procedure: LASER ABLATION CONDOLAMATA;  Surgeon: Leighton Ruff, MD;  Location: [facility];  Service: General;  Laterality: N/A;  . LASIK Bilateral 2002  . LYSIS OF ADHESION N/A 11/17/2017   Procedure: LYSIS OF ADHESION;  Surgeon: Excell Seltzer, MD;  Location: [facility];  Service: General;  Laterality: N/A;  . ROUX-EN-Y GASTRIC BYPASS  AUG 2013  . [place];  1994 x2;  02-03-1998  . STRABISMUS SURGERY Right   . VAGINAL HYSTERECTOMY  2007   fibroids   Social History   Occupational History  . Occupation: Sports coach: [employer]    Comment: [employer]  Tobacco Use  . Smoking status: Never Smoker  . Smokeless tobacco: Never Used  Vaping Use  . Vaping Use: Never used  Substance and Sexual Activity  . Alcohol use: No    Alcohol/week: 0.0 standard drinks  . Drug use: No  . Sexual activity: Yes    Birth control/protection: Surgical

## 2019-06-29 ENCOUNTER — Encounter: Payer: Self-pay | Admitting: Plastic Surgery

## 2019-06-29 ENCOUNTER — Ambulatory Visit (INDEPENDENT_AMBULATORY_CARE_PROVIDER_SITE_OTHER): Payer: Self-pay | Admitting: Plastic Surgery

## 2019-06-29 VITALS — BP 136/88 | HR 89 | Temp 97.8°F

## 2019-06-29 DIAGNOSIS — Z719 Counseling, unspecified: Secondary | ICD-10-CM

## 2019-06-29 NOTE — Progress Notes (Signed)
The patient is a 69 year old female here for follow-up on her facial surgery.  She is healing extremely well.  There are no open areas.  There is a little bit of redness in the postauricular area.  She should continue with the scar treatment.  In 2 to 3 months we can do a laser treatment if the areas have not lightened up.  She is very pleased with her results.

## 2019-06-30 ENCOUNTER — Other Ambulatory Visit: Payer: Self-pay | Admitting: *Deleted

## 2019-06-30 ENCOUNTER — Encounter: Payer: Self-pay | Admitting: Plastic Surgery

## 2019-06-30 MED ORDER — TRAMADOL HCL 50 MG PO TABS: 50.0000 mg | ORAL_TABLET | Freq: Four times a day (QID) | ORAL | 4 refills | Status: DC | PRN

## 2019-06-30 MED FILL — traMADol HCL 50 MG TABS: 50 | 3 days supply | Qty: 15 | Fill #0

## 2019-07-03 ENCOUNTER — Other Ambulatory Visit: Payer: Self-pay | Admitting: Family Medicine

## 2019-07-04 ENCOUNTER — Encounter: Payer: Self-pay | Admitting: Family Medicine

## 2019-07-04 MED ORDER — "SYRINGE 25G X 1"" 3 ML MISC": 0 refills | Status: DC

## 2019-07-04 MED FILL — BD 3 ML SYRINGE 25GX1: 25G X 1" | 365 days supply | Qty: 12 | Fill #0

## 2019-07-05 ENCOUNTER — Encounter: Payer: Self-pay | Admitting: Family Medicine

## 2019-07-06 ENCOUNTER — Other Ambulatory Visit: Payer: Self-pay | Admitting: Family Medicine

## 2019-07-06 ENCOUNTER — Other Ambulatory Visit: Payer: Self-pay

## 2019-07-06 MED ORDER — TRAZODONE HCL 50 MG PO TABS: ORAL_TABLET | ORAL | 3 refills | Status: DC

## 2019-07-06 MED ORDER — TIZANIDINE HCL 2 MG PO TABS: 2.0000 mg | ORAL_TABLET | Freq: Four times a day (QID) | ORAL | 3 refills | Status: DC | PRN

## 2019-07-06 MED FILL — tiZANidine HCL 2 MG TABS: 2 | 15 days supply | Qty: 120 | Fill #0

## 2019-07-06 NOTE — Telephone Encounter (Signed)
Please advise ok to send in new script?

## 2019-07-06 NOTE — Addendum Note (Signed)
Addended by: Francella Solian on: 07/06/2019 11:27 AM   Modules accepted: Orders

## 2019-07-06 NOTE — Addendum Note (Signed)
Addended by: Orma Flaming on: 07/06/2019 02:28 PM   Modules accepted: Orders

## 2019-07-08 ENCOUNTER — Other Ambulatory Visit: Payer: Self-pay

## 2019-07-08 ENCOUNTER — Other Ambulatory Visit (HOSPITAL_COMMUNITY): Payer: Self-pay | Admitting: Family Medicine

## 2019-07-08 ENCOUNTER — Encounter: Payer: Self-pay | Admitting: Family Medicine

## 2019-07-08 MED ORDER — CYANOCOBALAMIN 1000 MCG/ML IJ SOLN: INTRAMUSCULAR | 12 refills | Status: DC

## 2019-07-08 MED FILL — AJOVY 225 MG/1.5ML SOSY: 225 | 30 days supply | Qty: 2 | Fill #6

## 2019-07-08 MED FILL — CYANOCOBALAMIN 1,000 MCG/ML: 1000 | 30 days supply | Qty: 1 | Fill #0

## 2019-07-08 MED FILL — BUSPIRONE HCL 7.5 MG TABS: 7.5 | 30 days supply | Qty: 90 | Fill #4

## 2019-07-08 MED FILL — traZODone HCL 50 MG TABS: 50 | 30 days supply | Qty: 60 | Fill #0

## 2019-07-26 MED FILL — tiZANidine HCL 2 MG TABS: 2 | 15 days supply | Qty: 120 | Fill #1

## 2019-07-26 MED FILL — traMADol HCL 50 MG TABS: 50 | 3 days supply | Qty: 15 | Fill #1

## 2019-07-26 MED FILL — DIAZEPAM 10 MG TABS: 10 | 30 days supply | Qty: 60 | Fill #4

## 2019-08-04 ENCOUNTER — Telehealth: Payer: Self-pay | Admitting: Family Medicine

## 2019-08-04 MED FILL — BUSPIRONE HCL 7.5 MG TABS: 7.5 | 30 days supply | Qty: 90 | Fill #5

## 2019-08-04 MED FILL — traZODone HCL 50 MG TABS: 50 | 30 days supply | Qty: 60 | Fill #1

## 2019-08-04 MED FILL — CYANOCOBALAMIN 1,000 MCG/ML: 1000 | 30 days supply | Qty: 1 | Fill #1

## 2019-08-04 NOTE — Telephone Encounter (Signed)
PA for Ajovy has been completed on [email or web address]. Key is [id number]. PA was instantly approved from 08/04/19 to 08/03/20 for a maximum of 12 fills (1 per month).   Denise Rice is aware of the approval. Nothing further needed at time of call.

## 2019-08-05 MED FILL — AJOVY 225 MG/1.5ML SOSY: 225 | 30 days supply | Qty: 2 | Fill #7

## 2019-08-05 MED FILL — traMADol HCL 50 MG TABS: 50 | 3 days supply | Qty: 15 | Fill #2

## 2019-08-07 MED FILL — tiZANidine HCL 2 MG TABS: 2 | 15 days supply | Qty: 120 | Fill #2

## 2019-08-15 MED FILL — HYDROXYZINE HCL 25 MG TABS: 25 | 90 days supply | Qty: 180 | Fill #2

## 2019-08-22 ENCOUNTER — Encounter: Payer: Self-pay | Admitting: Family Medicine

## 2019-08-22 MED FILL — DIAZEPAM 10 MG TABS: 10 | 30 days supply | Qty: 60 | Fill #5

## 2019-08-23 MED ORDER — TRAZODONE HCL 50 MG PO TABS: 50.0000 mg | ORAL_TABLET | Freq: Every day | ORAL | 3 refills | Status: DC

## 2019-08-24 MED FILL — traZODone HCL 50 MG TABS: 50 | 90 days supply | Qty: 180 | Fill #0

## 2019-08-31 ENCOUNTER — Other Ambulatory Visit: Payer: Self-pay | Admitting: Family Medicine

## 2019-08-31 MED FILL — tiZANidine HCL 2 MG TABS: 2 | 15 days supply | Qty: 120 | Fill #3

## 2019-08-31 MED FILL — busPIRone HCL 7.5 MG TABS: 7.5 | 30 days supply | Qty: 90 | Fill #0

## 2019-09-04 MED FILL — traMADol HCL 50 MG TABS: 50 | 3 days supply | Qty: 15 | Fill #3

## 2019-09-05 MED FILL — CYANOCOBALAMIN 1,000 MCG/ML: 1000 | 30 days supply | Qty: 1 | Fill #2

## 2019-09-05 MED FILL — AJOVY 225 MG/1.5ML SOSY: 225 | 30 days supply | Qty: 2 | Fill #8

## 2019-09-09 ENCOUNTER — Encounter: Payer: Self-pay | Admitting: Plastic Surgery

## 2019-09-09 ENCOUNTER — Other Ambulatory Visit: Payer: Self-pay

## 2019-09-09 ENCOUNTER — Ambulatory Visit (INDEPENDENT_AMBULATORY_CARE_PROVIDER_SITE_OTHER): Payer: Self-pay | Admitting: Plastic Surgery

## 2019-09-09 VITALS — BP 110/75 | HR 83 | Temp 98.1°F

## 2019-09-09 DIAGNOSIS — Z719 Counseling, unspecified: Secondary | ICD-10-CM

## 2019-09-09 NOTE — Progress Notes (Signed)
The patient is a 68 year old female here for follow-up after undergoing a mini facelift in the office.  All of her incisions have healed.  There is no sign of infection.  She is very pleased with the results.  He does have a little bit of gelling and marionette lines that she would like to improve upon in the near future.  We talked about the RF microneedling and the laser as possibilities.  I asked her to check back with [place] in November he may have more information at that time.

## 2019-09-15 ENCOUNTER — Emergency Department (HOSPITAL_BASED_OUTPATIENT_CLINIC_OR_DEPARTMENT_OTHER): Payer: 59

## 2019-09-15 ENCOUNTER — Emergency Department (HOSPITAL_BASED_OUTPATIENT_CLINIC_OR_DEPARTMENT_OTHER)
Admission: EM | Admit: 2019-09-15 | Discharge: 2019-09-15 | Disposition: A | Discharge status: Home / Self Care | Payer: 59 | Attending: Emergency Medicine | Admitting: Emergency Medicine

## 2019-09-15 ENCOUNTER — Encounter (HOSPITAL_BASED_OUTPATIENT_CLINIC_OR_DEPARTMENT_OTHER): Payer: Self-pay | Admitting: Emergency Medicine

## 2019-09-15 ENCOUNTER — Other Ambulatory Visit: Payer: Self-pay

## 2019-09-15 DIAGNOSIS — Z79899 Other long term (current) drug therapy: Secondary | ICD-10-CM | POA: Diagnosis not present

## 2019-09-15 DIAGNOSIS — Z20822 Contact with and (suspected) exposure to covid-19: Secondary | ICD-10-CM | POA: Diagnosis not present

## 2019-09-15 DIAGNOSIS — R42 Dizziness and giddiness: Secondary | ICD-10-CM | POA: Diagnosis not present

## 2019-09-15 DIAGNOSIS — R531 Weakness: Secondary | ICD-10-CM | POA: Diagnosis not present

## 2019-09-15 DIAGNOSIS — R4182 Altered mental status, unspecified: Secondary | ICD-10-CM

## 2019-09-15 DIAGNOSIS — R0902 Hypoxemia: Secondary | ICD-10-CM | POA: Diagnosis not present

## 2019-09-15 DIAGNOSIS — Z9189 Other specified personal risk factors, not elsewhere classified: Secondary | ICD-10-CM

## 2019-09-15 DIAGNOSIS — I959 Hypotension, unspecified: Secondary | ICD-10-CM | POA: Diagnosis not present

## 2019-09-15 LAB — RAPID URINE DRUG SCREEN, HOSP PERFORMED
Amphetamines: NOT DETECTED
Barbiturates: NOT DETECTED
Benzodiazepines: POSITIVE — AB
Cocaine: NOT DETECTED
Opiates: POSITIVE — AB
Tetrahydrocannabinol: NOT DETECTED

## 2019-09-15 LAB — CBC
HCT: 31 % — ABNORMAL LOW (ref 36.0–46.0)
Hemoglobin: 10.5 g/dL — ABNORMAL LOW (ref 12.0–15.0)
MCH: 27.6 pg (ref 26.0–34.0)
MCHC: 33.9 g/dL (ref 30.0–36.0)
MCV: 81.6 fL (ref 80.0–100.0)
Platelets: 161 10*3/uL (ref 150–400)
RBC: 3.8 MIL/uL — ABNORMAL LOW (ref 3.87–5.11)
RDW: 13.1 % (ref 11.5–15.5)
WBC: 7.9 10*3/uL (ref 4.0–10.5)
nRBC: 0 % (ref 0.0–0.2)

## 2019-09-15 LAB — COMPREHENSIVE METABOLIC PANEL
ALT: 16 U/L (ref 0–44)
AST: 23 U/L (ref 15–41)
Albumin: 3.4 g/dL — ABNORMAL LOW (ref 3.5–5.0)
Alkaline Phosphatase: 56 U/L (ref 38–126)
Anion gap: 8 (ref 5–15)
BUN: 33 mg/dL — ABNORMAL HIGH (ref 8–23)
CO2: 27 mmol/L (ref 22–32)
Calcium: 8.3 mg/dL — ABNORMAL LOW (ref 8.9–10.3)
Chloride: 98 mmol/L (ref 98–111)
Creatinine, Ser: 0.78 mg/dL (ref 0.44–1.00)
GFR calc Af Amer: >60 mL/min (ref >60)
GFR calc non Af Amer: >60 mL/min (ref >60)
Glucose, Bld: 136 mg/dL — ABNORMAL HIGH (ref 70–99)
Potassium: 4.8 mmol/L (ref 3.5–5.1)
Sodium: 133 mmol/L — ABNORMAL LOW (ref 135–145)
Total Bilirubin: 0.5 mg/dL (ref 0.3–1.2)
Total Protein: 6.5 g/dL (ref 6.5–8.1)

## 2019-09-15 LAB — URINALYSIS, ROUTINE W REFLEX MICROSCOPIC
Bilirubin Urine: NEGATIVE
Glucose, UA: NEGATIVE mg/dL
Hgb urine dipstick: NEGATIVE
Ketones, ur: NEGATIVE mg/dL
Leukocytes,Ua: NEGATIVE
Nitrite: NEGATIVE
Protein, ur: NEGATIVE mg/dL
Specific Gravity, Urine: 1.025 (ref 1.005–1.030)
pH: 5.5 (ref 5.0–8.0)

## 2019-09-15 LAB — CBG MONITORING, ED: Glucose-Capillary: 120 mg/dL — ABNORMAL HIGH (ref 70–99)

## 2019-09-15 LAB — SARS CORONAVIRUS 2 BY RT PCR (HOSPITAL ORDER, PERFORMED IN ~~LOC~~ HOSPITAL LAB): SARS Coronavirus 2: NEGATIVE

## 2019-09-15 LAB — VITAMIN B12: Vitamin B-12: 613 pg/mL (ref 180–914)

## 2019-09-15 LAB — ETHANOL: Alcohol, Ethyl (B): <10 mg/dL

## 2019-09-15 LAB — AMMONIA: Ammonia: 11 umol/L (ref 9–35)

## 2019-09-15 MED ORDER — SODIUM CHLORIDE 0.9 % IV BOLUS
1000.0000 mL | Freq: Once | INTRAVENOUS | Status: AC
  Administered 2019-09-15: 1000 mL via INTRAVENOUS

## 2019-09-15 NOTE — ED Triage Notes (Signed)
Pt states she was driving to work, and an ambulance was behind her.  She pulled over and the ambulance driver asked her to go to ED.  Per husband, she has been falling asleep a lot lately and hasn't been herself for 6 months.  Slurred speech, confusion, generalized weakness, more lethargic than before, some veering to the right.  Pt states she has an inner ear problem with vertigo.

## 2019-09-15 NOTE — ED Triage Notes (Signed)
Per EMS:  Pt was stopped by EMS for erratic driving.  Pt seemed lethargic.  Pt is answering questions appropriately, negative stroke screen.  CBG 191.  NS: 500cc bolus given.  #18 Right AC

## 2019-09-15 NOTE — ED Notes (Signed)
ED Provider at bedside. 

## 2019-09-15 NOTE — ED Notes (Signed)
Ambulated pt to the restroom and back, pt is visibly shaking states "she is freezing and feels good, just tired".  Took about 30 seconds sitting on the side of the bed before initially starting, then with some aid getting her on her feet, then was able to walk using one cane on right side, but had one instance of being wobbly and gripping the handrail on the walk from room 1 to triage area bathroom.  Pt used the restroom on her own with door cracked, able to sit and stand unaided, washed hands, then ambulated back to room and hooked back onto monitor.  Pt. Had noted trembling, couple of times that her eyes closed while walking for a second or two, seems very fatigued at this time.
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
## 2019-09-15 NOTE — Discharge Instructions (Addendum)
I am concerned that your drowsiness and presentation to the ER today may be related to the medications you are taking.  You are being prescribed several medications including zanaflex (muscle relaxer), benzodiazepines (valium, xanax) and opioids (tramadol) that can lead to extreme drowsiness, fatigue, and slow your breathing.  These are particularly dangerous when mixed together.    Please make every effort to minimize the amount of these medications you are taking.  Try to get yourself on a regular schedule and use the LOWEST amount of each medication available.  Never drink alcohol while using these meds.  You should also not drive a car after taking these medications.  Talk to your doctor(s) about a plan for managing your medications at home.  These medicines can put you at high risk for overdosing, dying, or developing life-long dependency.

## 2019-09-18 LAB — VITAMIN B1: Vitamin B1 (Thiamine): 140.7 nmol/L (ref 66.5–200.0)

## 2019-09-20 ENCOUNTER — Other Ambulatory Visit: Payer: Self-pay | Admitting: Family Medicine

## 2019-09-20 ENCOUNTER — Other Ambulatory Visit: Payer: Self-pay

## 2019-09-20 MED ORDER — ESCITALOPRAM OXALATE 20 MG PO TABS
20.0000 mg | ORAL_TABLET | Freq: Every day | ORAL | 0 refills | Status: DC
Start: 2019-09-20 — End: 2019-09-21

## 2019-09-20 MED FILL — ESCITALOPRAM 20 MG TABLET: 20 | 90 days supply | Qty: 90 | Fill #0

## 2019-09-20 NOTE — Telephone Encounter (Signed)
LR: 04-01-2019 Qty: 60 with 5 refills Last office visit: 05-04-2019 Upcoming appointment: No pending appt

## 2019-09-20 NOTE — Telephone Encounter (Signed)
LAST APPOINTMENT DATE:05/04/2019   NEXT APPOINTMENT DATE: Visit date not found    LAST REFILL: 04/01/2019  QTY: #[id number] [phone]

## 2019-09-21 MED FILL — DIAZEPAM 10 MG TABS: 10 | 30 days supply | Qty: 60 | Fill #0

## 2019-09-21 NOTE — Telephone Encounter (Signed)
Please call patient: i'd like to see her in the office to f/u on her ED visit and review her medications. Please schedule for in office visit in the next coming weeks (ie, don't use same day appt).  I reviewed patient's records from the PMP aware controlled substance registry today.

## 2019-09-21 NOTE — Telephone Encounter (Signed)
Patient is scheduled for this Friday at 8 am.

## 2019-09-23 ENCOUNTER — Encounter: Payer: Self-pay | Admitting: Family Medicine

## 2019-09-23 ENCOUNTER — Ambulatory Visit: Payer: 59 | Admitting: Family Medicine

## 2019-09-23 ENCOUNTER — Other Ambulatory Visit: Payer: Self-pay

## 2019-09-23 VITALS — BP 128/70 | HR 77 | Temp 97.8°F | Resp 18 | Ht 61.0 in | Wt 140.8 lb

## 2019-09-23 DIAGNOSIS — F5101 Primary insomnia: Secondary | ICD-10-CM

## 2019-09-23 DIAGNOSIS — F419 Anxiety disorder, unspecified: Secondary | ICD-10-CM | POA: Diagnosis not present

## 2019-09-23 DIAGNOSIS — F329 Major depressive disorder, single episode, unspecified: Secondary | ICD-10-CM | POA: Diagnosis not present

## 2019-09-23 DIAGNOSIS — Z79899 Other long term (current) drug therapy: Secondary | ICD-10-CM | POA: Diagnosis not present

## 2019-09-23 DIAGNOSIS — Z23 Encounter for immunization: Secondary | ICD-10-CM | POA: Diagnosis not present

## 2019-09-23 DIAGNOSIS — Z9189 Other specified personal risk factors, not elsewhere classified: Secondary | ICD-10-CM | POA: Diagnosis not present

## 2019-09-23 DIAGNOSIS — H903 Sensorineural hearing loss, bilateral: Secondary | ICD-10-CM

## 2019-09-23 MED ORDER — METAXALONE 800 MG PO TABS: 800.0000 mg | ORAL_TABLET | Freq: Three times a day (TID) | ORAL | 3 refills | Status: DC | PRN

## 2019-09-23 MED ORDER — DIAZEPAM 10 MG PO TABS
10.0000 mg | ORAL_TABLET | Freq: Every day | ORAL | 5 refills | Status: DC
Start: 2019-09-23 — End: 2019-10-28

## 2019-09-23 MED FILL — METAXALONE 800 MG TABS: 800 | 30 days supply | Qty: 90 | Fill #0

## 2019-09-23 NOTE — Progress Notes (Signed)
Subjective  CC:  Chief Complaint  Patient presents with  . Follow-up    Altered mental status, stated that she would like to feel normal again. She would like to discuss changing the frequency of some of her meds.   . Health Maintenance    She going to schedule her Mammogram. Flu shot in office today.    HPI: Denise Rice is a 69 y.o. female who presents to the office today to address the problems listed above in the chief complaint.  69 year old who was recently pulled over while driving erratically was evaluated in the emergency room.  I reviewed emergency records including imaging studies, lab results and physicians notes.  Altered mental status thought to be due to polypharmacy and side effects of medications.  Here today to discuss further.  She has been on multiple anxiety medicines, muscle relaxers, pain medications due to different overlapping problems.  We discussed each in detail:  Anxiety: She has had a very difficult last 18 months.  Anxiety symptoms and panic symptoms have worsened.  Mood symptoms have worsened.  In the setting we started Lexapro, then went from Xanax to Valium.  Also using BuSpar and hyper paroxetine.  She admits that she has used more medications off schedule over the last several months since losing her job.  She is also thinner.  She admits she worries about potential side effects.  Anxiety persist.  She is looking for a job.  She feels Lexapro is helpful.  She has not been to counseling  Hip and knee pain: Sports medicine has been prescribing muscle relaxers.  Migraine headaches: Sees neurology and does well on Ajovy however has breakthrough migraines at the end of the month.  She has been prescribed tramadol for use.  She says she is never been on a triptan before.  Insomnia: Has primary insomnia but this has been worsened by her anxiety symptoms.  She uses trazodone nightly but also was using hydroxyzine and Valium.  I reviewed patient's records from the  PMP aware controlled substance registry today.   Assessment  1. Polypharmacy   2. Potential for altered mental status   3. Bilateral sensorineural hearing loss   4. Major depression, chronic   5. Anxiety   6. Primary insomnia   7. Need for immunization against influenza      Plan   Polypharmacy and altered mental status: We discussed in detail the risk involved with this combination of medications and the importance of taking them on schedule.  I did like to wean medicines that can affect her cognition is much as possible.  Due to her chronic anxiety at this time we will continue Valium but decrease to once daily dosing.  Continue Lexapro.  Continue BuSpar as needed but stop hydroxyzine.  Recheck in 3 weeks  Stop the muscle relaxer, consider Skelaxin.  Stop tramadol consider triptan.  She will discuss these possible changes with her specialist.  Continue trazodone along for sleep.  If cannot get depression and anxiety symptoms under control, consider psychiatry.  Recommend psychotherapy given her multiple stressors over the last year and a half.  Patient will make an appointment.  Flu shot updated  Follow up: No follow-ups on file.  Visit date not found  Orders Placed This Encounter  Procedures  . Flu Vaccine QUAD High Dose(Fluad)   Meds ordered this encounter  Medications  . diazepam (VALIUM) 10 MG tablet    Sig: Take 1 tablet (10 mg total) by mouth daily.  Dispense:  30 tablet    Refill:  5    Decreasing dose from bid to daily; please cancel other RXs for BID dosing.      I reviewed the patients updated PMH, FH, and SocHx.    Patient Active Problem List   Diagnosis Date Noted  . Bilateral sensorineural hearing loss 09/06/2014    Priority: High  . Chronic migraine 05/04/2019    Priority: Medium  . History of small bowel obstruction 11/17/2017    Priority: Medium  . Chronic vertigo 03/11/2017    Priority: Medium  . Major depression, chronic 03/11/2017     Priority: Medium  . PCOS (polycystic ovarian syndrome) 03/11/2017    Priority: Medium  . Insomnia 09/08/2015    Priority: Medium  . 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 History of laparoscopic adjustable gastric banding 01/22/2011    Priority: Medium  . Lactose intolerance 12/22/2018    Priority: Low  . 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 Vitamin D deficiency 01/15/2008    Priority: Low  . Vitamin B12 deficiency 07/29/2018  . Anxiety 03/14/2018  . Elschnig bodies following cataract surgery, 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   Current Meds  Medication Sig  . AJOVY 225 MG/1.5ML SOSY INJECT 225 MG INTO THE SKIN EVERY 30 DAYS  . Biotin 5000 MCG CAPS Take 5,000 mcg by mouth daily.   . busPIRone (BUSPAR) 7.5 MG tablet TAKE 1 TABLET BY MOUTH THREE TIMES DAILY AS NEEDED FOR ANXIETY  . cyanocobalamin (,VITAMIN B-12,) 1000 MCG/ML injection INJECT 1 ML INTO THE MUSCLE EVERY 30 DAYS  . cyanocobalamin 2000 MCG tablet Take 2,000 mcg by mouth daily.  . diazepam (VALIUM) 10 MG tablet Take 1 tablet (10 mg total) by mouth daily.  Marland Kitchen escitalopram (LEXAPRO) 20 MG tablet TAKE 1 TABLET BY MOUTH ONCE A DAY  . ferrous sulfate 325 (65 FE) MG tablet Take 325 mg by mouth daily with breakfast.  . Multiple Vitamin (MULTIVITAMIN WITH MINERALS) TABS tablet Take 1 tablet by mouth daily.  Vladimir Faster Glycol-Propyl Glycol (SYSTANE OP) Apply to eye daily.  . Syringe/Needle, Disp, (SYRINGE 3CC/25GX1") 25G X 1" 3 ML MISC Use once monthly for injection of Viatmin B-12  . traMADol (ULTRAM) 50 MG tablet Take 1 tablet (50 mg total) by mouth every 6 (six) hours as needed.  . traZODone (DESYREL) 50 MG tablet Take 1-2 tablets (50-100 mg total) by mouth at bedtime. Change to 90 day supply. Please allow for early refill due to upcoming travel. thanks  . [DISCONTINUED] diazepam (VALIUM) 10 MG tablet TAKE 1 TABLET BY MOUTH TWO TIMES DAILY  . [DISCONTINUED] hydrOXYzine  (ATARAX/VISTARIL) 25 MG tablet TAKE 1 TABLET BY MOUTH TWO TIMES DAILY AS NEEDED (Patient taking differently: Take 25 mg by mouth at bedtime. )  . [DISCONTINUED] tiZANidine (ZANAFLEX) 2 MG tablet Take 1-2 tablets (2-4 mg total) by mouth every 6 (six) hours as needed for muscle spasms.    Allergies: Patient is allergic to topiramate. Family History: Patient family history includes Anxiety disorder in her mother; Cancer in her father, paternal aunt, and paternal uncle; Diabetes in her father, maternal aunt, mother, and sister; Heart attack in her mother; Heart disease in her father, maternal aunt, and mother; Hyperlipidemia in her father and mother; Hypertension in her father and mother; Kidney disease in her father and mother; Obesity in her father and mother; Stroke in her father, mother, and sister; Thyroid disease in her  mother. Social History:  Patient  reports that she has never smoked. She has never used smokeless tobacco. She reports that she does not drink alcohol and does not use drugs.  Review of Systems: Constitutional: Negative for fever malaise or anorexia Cardiovascular: negative for chest pain Respiratory: negative for SOB or persistent cough Gastrointestinal: negative for abdominal pain  Objective  Vitals: BP 128/70   Pulse 77   Temp 97.8 F (36.6 C) (Temporal)   Resp 18   Ht 5\' 1"  (1.549 m)   Wt 140 lb 12.8 oz (63.9 kg)   LMP 01/13/2005   SpO2 98%   BMI 26.60 kg/m  General: no acute distress , A&Ox3 Psych: Alert, normal affect and cognition, fair insight    Commons side effects, risks, benefits, and alternatives for medications and treatment plan prescribed today were discussed, and the patient expressed understanding of the given instructions. Patient is instructed to call or message via MyChart if he/she has any questions or concerns regarding our treatment plan. No barriers to understanding were identified. We discussed Red Flag symptoms and signs in detail.  Patient expressed understanding regarding what to do in case of urgent or emergency type symptoms.   Medication list was reconciled, printed and provided to the patient in AVS. Patient instructions and summary information was reviewed with the patient as documented in the AVS. This note was prepared with assistance of Dragon voice recognition software. Occasional wrong-word or sound-a-like substitutions may have occurred due to the inherent limitations of voice recognition software  This visit occurred during the SARS-CoV-2 public health emergency.  Safety protocols were in place, including screening questions prior to the visit, additional usage of staff PPE, and extensive cleaning of exam room while observing appropriate contact time as indicated for disinfecting solutions.

## 2019-09-23 NOTE — Patient Instructions (Signed)
Please return in 3 weeks for recheck.   Please stop the hydroxyzine. I recommend trying skelaxin for muscle spasm and stopping the tizanadine. Discuss with Dr. Junius Roads. I recommend trying a triptan for your migraines instead of tramadol. Discuss with Dr. Jaynee Eagles. Use the buspar during the day for anxiety. Decrease valium to once a day.  Continue trazadone alone at night for sleep.   Please call [facility] to schedule an appointment with Dr. Trey Paula; she is a therapist here at our [place] office.  The phone number is: [phone]  If you have any questions or concerns, please don't hesitate to send me a message via MyChart or call the office at [phone]. Thank you for visiting with [place] today! It's our pleasure caring for you.

## 2019-09-26 MED ORDER — RIZATRIPTAN BENZOATE 10 MG PO TBDP: 10.0000 mg | ORAL_TABLET | ORAL | 11 refills | Status: DC | PRN

## 2019-09-26 MED FILL — RIZATRIPTAN 10 MG ODT: 10 | 17 days supply | Qty: 10 | Fill #0

## 2019-10-03 MED FILL — RIZATRIPTAN 10 MG ODT: 10 | 17 days supply | Qty: 10 | Fill #0

## 2019-10-04 ENCOUNTER — Encounter: Payer: Self-pay | Admitting: Family Medicine

## 2019-10-04 MED ORDER — METAXALONE 800 MG PO TABS: 800.0000 mg | ORAL_TABLET | Freq: Three times a day (TID) | ORAL | 6 refills | Status: DC | PRN

## 2019-10-04 MED FILL — METAXALONE 800 MG TABS: 800 | 30 days supply | Qty: 90 | Fill #0

## 2019-10-04 MED FILL — busPIRone HCL 7.5 MG TABS: 7.5 | 30 days supply | Qty: 90 | Fill #1

## 2019-10-04 MED FILL — AJOVY 225 MG/1.5ML SOSY: 225 | 30 days supply | Qty: 2 | Fill #9

## 2019-10-04 MED FILL — CYANOCOBALAMIN 1,000 MCG/ML: 1000 | 30 days supply | Qty: 1 | Fill #3

## 2019-10-06 ENCOUNTER — Encounter: Payer: Self-pay | Admitting: Family Medicine

## 2019-10-07 ENCOUNTER — Telehealth: Payer: Self-pay

## 2019-10-07 ENCOUNTER — Other Ambulatory Visit: Payer: Self-pay

## 2019-10-07 MED ORDER — DICLOFENAC SODIUM 50 MG PO TBEC: 50.0000 mg | DELAYED_RELEASE_TABLET | Freq: Two times a day (BID) | ORAL | 0 refills | Status: DC

## 2019-10-07 MED FILL — DICLOFENAC SOD EC 50 MG TAB: 50 | 30 days supply | Qty: 60 | Fill #0

## 2019-10-07 NOTE — Telephone Encounter (Signed)
Med sent to pharmacy. Patient advised to alternate heat and ice.

## 2019-10-07 NOTE — Telephone Encounter (Signed)
Can patient take nsaids? Recommend diclofenac 100mg  bid #30 w/o refill with meals.   Ice or head as needed.

## 2019-10-07 NOTE — Telephone Encounter (Signed)
Patient called Denise Rice personal cell who then handed me the phone stating that last week she was washing her dogs and while bent over the tub - believes to have injured her left lowest rib. States while at [employer] on Monday, she bent across the jewelry counter which caused enough pain to make her yell out. I informed patient that I would route message to PCP, but that we did not have any openings today. Patient stated that she could not go another weekend in this type of pain and that she had just pulled up outside of our office and was coming to speak to someone directly. Placed patient into a room and had her explain her injuries again. She then apologized for her appearance because she had just left the gym. Patient clarified that she has only taken Valium and Buspar today.  Refusing to go to urgent care, adamant that PCP send a pain medication in for her to help with pain until she is able to be seen next week.

## 2019-10-13 ENCOUNTER — Ambulatory Visit: Payer: 59 | Admitting: Family Medicine

## 2019-10-14 ENCOUNTER — Ambulatory Visit: Payer: 59 | Admitting: Family Medicine

## 2019-10-24 NOTE — Progress Notes (Deleted)
69 y.o. G0P0 Married White or Caucasian female here for breast and pelvic exam.  I am also following her for ***.  Denies vaginal bleeding.  Patient's last menstrual period was 01/13/2005.   hysterectomy       Sexually active: {yes no:314532}  H/O STD:  {YES NO:22349}  Health Maintenance: PCP:  Billey Chang  Last wellness appt was ***.  Did blood work at that appt:   Vaccines are up to date:  {YES NO:22349} Colonoscopy:  10-12-15  MMG:  10-21-17 category b density birads 1:neg BMD:  Osteopenia 01/2018 Last pap smear:  09-12-16 neg.   H/o abnormal pap smear:  yes    reports that she has never smoked. She has never used smokeless tobacco. She reports that she does not drink alcohol and does not use drugs.  Past Medical History:  Diagnosis Date  . Anal condyloma   . Anxiety   . Constipation   . Deafness in left ear   . Depression   . Fatigue   . Gallbladder problem   . Hearing loss, sensorineural, high frequency    RIGHT EAR  . 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 Last eruption 3/12  . History of peptic ulcer   . Infertility, female   . Lactose intolerance   . Otosclerosis of left ear   . Pancreatic disease   . PCO (polycystic ovaries)   . PCOS (polycystic ovarian syndrome) 03/11/2017  . PONV (postoperative nausea and vomiting)   . 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 Vertigo   . Vitamin B12 deficiency 07/29/2018   Started IM injections monthly 06/2018  . Vitamin D deficiency   . Wears hearing aid    both ears    Past Surgical History:  Procedure Laterality Date  . BLEPHAROPLASTY  03-05-2000  . CRANIOTOMY  08/2014   to repair dehescence in right ear  . HAMMER TOE SURGERY Left 08-29-2013   2nd toe  . IMPLANTATION BONE ANCHORED HEARING AID Left 07/27/2008   left temporal bone;now removed  . LAPAROSCOPIC CHOLECYSTECTOMY  03-31-2005  . LAPAROSCOPIC GASTRIC BANDING  02-08-2007  . LAPAROSCOPY N/A  11/17/2017   Procedure: LAPAROSCOPY LYSIS OF ADHESIONS FOR SMALL BOWEL OBSTRUCTION;  Surgeon: Excell Seltzer, MD;  Location: [facility];  Service: General;  Laterality: N/A;  . LASER ABLATION CONDOLAMATA N/A 07/30/2012   Procedure: LASER ABLATION CONDOLAMATA;  Surgeon: Leighton Ruff, MD;  [facility];  Service: General;  Laterality: N/A;  . LASIK Bilateral 2002  . LYSIS OF ADHESION N/A 11/17/2017   Procedure: LYSIS OF ADHESION;  Surgeon: Excell Seltzer, MD;  Location: [facility];  Service: General;  Laterality: N/A;  . ROUX-EN-Y GASTRIC BYPASS  AUG 2013  . [place];  1994 x2;  02-03-1998  . STRABISMUS SURGERY Right   . VAGINAL HYSTERECTOMY  2007   fibroids    Current Outpatient Medications  Medication Sig Dispense Refill  . AJOVY 225 MG/1.5ML SOSY INJECT 225 MG INTO THE SKIN EVERY 30 DAYS 1.5 mL 10  . Biotin 5000 MCG CAPS Take 5,000 mcg by mouth daily.     . busPIRone (BUSPAR) 7.5 MG tablet TAKE 1 TABLET BY MOUTH THREE TIMES DAILY AS NEEDED FOR ANXIETY 90 tablet 5  . cholecalciferol (VITAMIN D3) 25 MCG (1000 UT) tablet Take 1,000 Units by mouth daily.    . cyanocobalamin (,VITAMIN B-12,) 1000 MCG/ML injection INJECT 1 ML INTO THE MUSCLE  EVERY 30 DAYS 1 mL 12  . cyanocobalamin 2000 MCG tablet Take 2,000 mcg by mouth daily.    . diazepam (VALIUM) 10 MG tablet Take 1 tablet (10 mg total) by mouth daily. 30 tablet 5  . diclofenac (VOLTAREN) 50 MG EC tablet Take 1 tablet (50 mg total) by mouth 2 (two) times daily. 60 tablet 0  . escitalopram (LEXAPRO) 20 MG tablet TAKE 1 TABLET BY MOUTH ONCE A DAY 90 tablet 3  . ferrous sulfate 325 (65 FE) MG tablet Take 325 mg by mouth daily with breakfast.    . metaxalone (SKELAXIN) 800 MG tablet Take 1 tablet (800 mg total) by mouth 3 (three) times daily as needed for muscle spasms. 90 tablet 6  . Multiple Vitamin (MULTIVITAMIN WITH MINERALS) TABS tablet Take 1 tablet by mouth daily.    Vladimir Faster Glycol-Propyl Glycol  (SYSTANE OP) Apply to eye daily.    . rizatriptan (MAXALT-MLT) 10 MG disintegrating tablet Take 1 tablet (10 mg total) by mouth as needed for migraine. May repeat in 2 hours if needed 10 tablet 11  . Syringe/Needle, Disp, (SYRINGE 3CC/25GX1") 25G X 1" 3 ML MISC Use once monthly for injection of Viatmin B-12 50 each 0  . traZODone (DESYREL) 50 MG tablet Take 1-2 tablets (50-100 mg total) by mouth at bedtime. Change to 90 day supply. Please allow for early refill due to upcoming travel. thanks 180 tablet 3   No current facility-administered medications for this visit.    Family History  Problem Relation Age of Onset  . Heart attack Mother   . Heart disease Mother   . Diabetes Mother   . Hypertension Mother   . Hyperlipidemia Mother   . Stroke Mother   . Kidney disease Mother   . Thyroid disease Mother   . Anxiety disorder Mother   . Obesity Mother   . Cancer Father        unknown primary  . Diabetes Father   . Hypertension Father   . Hyperlipidemia Father   . Heart disease Father   . Stroke Father   . Kidney disease Father   . Obesity Father   . Stroke Sister   . Diabetes Sister   . Diabetes Maternal Aunt   . Heart disease Maternal Aunt   . Cancer Paternal Aunt   . Cancer Paternal Uncle   . Colon cancer Neg Hx   . Colon polyps Neg Hx     Review of Systems  Exam:   LMP 01/13/2005      General appearance: alert, cooperative and appears stated age Breasts: {Exam; breast:13139::"normal appearance, no masses or tenderness"} Abdomen: soft, non-tender; bowel sounds normal; no masses,  no organomegaly Lymph nodes: Cervical, supraclavicular, and axillary nodes normal.  No abnormal inguinal nodes palpated Neurologic: Grossly normal  Pelvic: External genitalia:  no lesions              Urethra:  normal appearing urethra with no masses, tenderness or lesions              Bartholins and Skenes: normal                 Vagina: normal appearing vagina with normal color and  discharge, no lesions              Cervix: {exam; cervix:14595}              Pap taken: {yes no:314532} Bimanual Exam:  Uterus:  {exam; uterus:12215}  Adnexa: {exam; adnexa:12223}               Rectovaginal: Confirms               Anus:  normal sphincter tone, no lesions  Chaperone, ***, CMA, was present for exam.  A:  Breast and Pelvic exam  P:   {plan; gyn:5269::"mammogram","pap smear","return annually or prn"}  *** minutes of total time was spent for this patient encounter, including preparation, face-to-face counseling with the patient and coordination of care, and documentation of the encounter.

## 2019-10-28 ENCOUNTER — Other Ambulatory Visit: Payer: Self-pay

## 2019-10-28 ENCOUNTER — Encounter: Payer: Self-pay | Admitting: Family Medicine

## 2019-10-28 ENCOUNTER — Ambulatory Visit (INDEPENDENT_AMBULATORY_CARE_PROVIDER_SITE_OTHER): Payer: PRIVATE HEALTH INSURANCE | Admitting: Family Medicine

## 2019-10-28 ENCOUNTER — Ambulatory Visit: Payer: Self-pay | Admitting: Obstetrics & Gynecology

## 2019-10-28 VITALS — BP 120/84 | HR 83 | Temp 98.1°F | Ht 61.0 in | Wt 140.2 lb

## 2019-10-28 DIAGNOSIS — F5101 Primary insomnia: Secondary | ICD-10-CM

## 2019-10-28 DIAGNOSIS — F329 Major depressive disorder, single episode, unspecified: Secondary | ICD-10-CM

## 2019-10-28 DIAGNOSIS — R42 Dizziness and giddiness: Secondary | ICD-10-CM | POA: Diagnosis not present

## 2019-10-28 DIAGNOSIS — Z1231 Encounter for screening mammogram for malignant neoplasm of breast: Secondary | ICD-10-CM

## 2019-10-28 DIAGNOSIS — Z79899 Other long term (current) drug therapy: Secondary | ICD-10-CM

## 2019-10-28 DIAGNOSIS — F419 Anxiety disorder, unspecified: Secondary | ICD-10-CM

## 2019-10-28 MED ORDER — DIAZEPAM 10 MG PO TABS
10.0000 mg | ORAL_TABLET | Freq: Every day | ORAL | 5 refills | Status: DC
Start: 2019-12-14 — End: 2020-02-01

## 2019-10-28 MED ORDER — BUSPIRONE HCL 7.5 MG PO TABS
7.5000 mg | ORAL_TABLET | Freq: Every day | ORAL | 5 refills | Status: DC | PRN
Start: 2019-10-28 — End: 2020-02-29

## 2019-10-28 MED ORDER — DIAZEPAM 10 MG PO TABS
10.0000 mg | ORAL_TABLET | Freq: Every day | ORAL | 5 refills | Status: DC
Start: 2019-12-14 — End: 2019-10-28

## 2019-10-28 MED ORDER — TRAZODONE HCL 50 MG PO TABS
50.0000 mg | ORAL_TABLET | Freq: Every day | ORAL | 3 refills | Status: DC
Start: 2019-10-28 — End: 2019-12-29

## 2019-10-28 NOTE — Addendum Note (Signed)
Addended by: Billey Chang on: 10/28/2019 01:37 PM   Modules accepted: Orders

## 2019-10-28 NOTE — Progress Notes (Signed)
Subjective  CC:  Chief Complaint  Patient presents with  . Follow-up  . Altered Mood  . Depression  . Anxiety    HPI: Denise Rice is a 69 y.o. female who presents to the office today to address the problems listed above in the chief complaint.  Here for follow-up regarding altered mental status due to polypharmacy: At last visit we adjusted medication significantly.  Decrease Valium from 10 mg twice daily to daily.  Stop hydroxyzine.  Decrease BuSpar.  Stopped all pain medicine.  Stop muscle relaxers.  Switched to skelaxin.  Using diclofenac as needed for pain.  Continues on Lexapro.  She reports she is doing much better.  More alert.  Balance is better.  Responded well to trazodone 25 mg nightly for sleep.  Mood is stable.  No worsening anxiety symptoms.  Had Covid booster yesterday and is feeling achy today.  Health maintenance.  Overdue for mammogram Assessment  1. Major depression, chronic   2. Encounter for screening mammogram for breast cancer   3. Primary insomnia   4. Chronic vertigo   5. Anxiety   6. Polypharmacy      Plan   Polypharmacy: Decreasing medications.  Depression anxiety are well controlled.  Continue Lexapro, as needed BuSpar  Insomnia trazodone 25 mg nightly.  Efficacious.  Chronic vertigo on Valium 10 mg daily.  Do not increase this medication.  Ordered mammogram  Due for physical  Follow up: CPE 02/01/2020  Orders Placed This Encounter  Procedures  . MM DIGITAL SCREENING BILATERAL   Meds ordered this encounter  Medications  . traZODone (DESYREL) 50 MG tablet    Sig: Take 1 tablet (50 mg total) by mouth at bedtime.    Dispense:  180 tablet    Refill:  3  . diazepam (VALIUM) 10 MG tablet    Sig: Take 1 tablet (10 mg total) by mouth daily.    Dispense:  30 tablet    Refill:  5    Decreasing dose from bid to daily; please cancel other RXs for BID dosing. Please hold this for next due refill in December. Do not refill any other RXs prior  to that.  . busPIRone (BUSPAR) 7.5 MG tablet    Sig: Take 1 tablet (7.5 mg total) by mouth daily as needed. for anxiety    Dispense:  90 tablet    Refill:  5      I reviewed the patients updated PMH, FH, and SocHx.    Patient Active Problem List   Diagnosis Date Noted  . Bilateral sensorineural hearing loss 09/06/2014    Priority: High  . Chronic migraine 05/04/2019    Priority: Medium  . History of small bowel obstruction 11/17/2017    Priority: Medium  . Chronic vertigo 03/11/2017    Priority: Medium  . Major depression, chronic 03/11/2017    Priority: Medium  . PCOS (polycystic ovarian syndrome) 03/11/2017    Priority: Medium  . Insomnia 09/08/2015    Priority: Medium  . 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 History of laparoscopic adjustable gastric banding 01/22/2011    Priority: Medium  . Lactose intolerance 12/22/2018    Priority: Low  . 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 Vitamin D deficiency 01/15/2008    Priority: Low  . Vitamin B12 deficiency 07/29/2018  . Anxiety 03/14/2018  . Elschnig bodies following cataract surgery, 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   Current Meds  Medication Sig  . AJOVY 225 MG/1.5ML SOSY INJECT 225 MG INTO THE SKIN EVERY 30 DAYS  . Biotin 5000 MCG CAPS Take 5,000 mcg by mouth daily.   . busPIRone (BUSPAR) 7.5 MG tablet Take 1 tablet (7.5 mg total) by mouth daily as needed. for anxiety  . cholecalciferol (VITAMIN D3) 25 MCG (1000 UT) tablet Take 1,000 Units by mouth daily.  . cyanocobalamin (,VITAMIN B-12,) 1000 MCG/ML injection INJECT 1 ML INTO THE MUSCLE EVERY 30 DAYS  . cyanocobalamin 2000 MCG tablet Take 2,000 mcg by mouth daily.  Derrill Memo ON 12/14/2019] diazepam (VALIUM) 10 MG tablet Take 1 tablet (10 mg total) by mouth daily.  . diclofenac (VOLTAREN) 50 MG EC tablet Take 1 tablet (50 mg total) by mouth 2 (two) times daily.  Marland Kitchen escitalopram (LEXAPRO) 20 MG tablet TAKE 1 TABLET  BY MOUTH ONCE A DAY  . ferrous sulfate 325 (65 FE) MG tablet Take 325 mg by mouth daily with breakfast.  . metaxalone (SKELAXIN) 800 MG tablet Take 1 tablet (800 mg total) by mouth 3 (three) times daily as needed for muscle spasms.  . Multiple Vitamin (MULTIVITAMIN WITH MINERALS) TABS tablet Take 1 tablet by mouth daily.  Vladimir Faster Glycol-Propyl Glycol (SYSTANE OP) Apply to eye daily.  . rizatriptan (MAXALT-MLT) 10 MG disintegrating tablet Take 1 tablet (10 mg total) by mouth as needed for migraine. May repeat in 2 hours if needed  . Syringe/Needle, Disp, (SYRINGE 3CC/25GX1") 25G X 1" 3 ML MISC Use once monthly for injection of Viatmin B-12  . traZODone (DESYREL) 50 MG tablet Take 1 tablet (50 mg total) by mouth at bedtime.  . [DISCONTINUED] busPIRone (BUSPAR) 7.5 MG tablet TAKE 1 TABLET BY MOUTH THREE TIMES DAILY AS NEEDED FOR ANXIETY  . [DISCONTINUED] diazepam (VALIUM) 10 MG tablet Take 1 tablet (10 mg total) by mouth daily.  . [DISCONTINUED] traZODone (DESYREL) 50 MG tablet Take 1-2 tablets (50-100 mg total) by mouth at bedtime. Change to 90 day supply. Please allow for early refill due to upcoming travel. thanks    Allergies: Patient is allergic to topiramate. Family History: Patient family history includes Anxiety disorder in her mother; Cancer in her father, paternal aunt, and paternal uncle; Diabetes in her father, maternal aunt, mother, and sister; Heart attack in her mother; Heart disease in her father, maternal aunt, and mother; Hyperlipidemia in her father and mother; Hypertension in her father and mother; Kidney disease in her father and mother; Obesity in her father and mother; Stroke in her father, mother, and sister; Thyroid disease in her mother. Social History:  Patient  reports that she has never smoked. She has never used smokeless tobacco. She reports that she does not drink alcohol and does not use drugs.  Review of Systems: Constitutional: Negative for fever malaise or  anorexia Cardiovascular: negative for chest pain Respiratory: negative for SOB or persistent cough Gastrointestinal: negative for abdominal pain  Objective  Vitals: BP 120/84   Pulse 83   Temp 98.1 F (36.7 C) (Temporal)   Ht 5\' 1"  (1.549 m)   Wt 140 lb 3.2 oz (63.6 kg)   LMP 01/13/2005   SpO2 98%   BMI 26.49 kg/m  General: no acute distress , A&Ox3 Psych: clear cognitively     Commons side effects, risks, benefits, and alternatives for medications and treatment plan prescribed today were discussed, and the patient expressed understanding of the given instructions. Patient is instructed to call or message via MyChart if he/she has any questions or  concerns regarding our treatment plan. No barriers to understanding were identified. We discussed Red Flag symptoms and signs in detail. Patient expressed understanding regarding what to do in case of urgent or emergency type symptoms.   Medication list was reconciled, printed and provided to the patient in AVS. Patient instructions and summary information was reviewed with the patient as documented in the AVS. This note was prepared with assistance of Dragon voice recognition software. Occasional wrong-word or sound-a-like substitutions may have occurred due to the inherent limitations of voice recognition software  This visit occurred during the SARS-CoV-2 public health emergency.  Safety protocols were in place, including screening questions prior to the visit, additional usage of staff PPE, and extensive cleaning of exam room while observing appropriate contact time as indicated for disinfecting solutions.

## 2019-10-28 NOTE — Patient Instructions (Signed)
Please return in 3 months for your annual complete physical; please come fasting.  Please call [facility] to schedule an appointment with Dr. Trey Paula; she is a therapist here at our [place] office.  The phone number is: [phone]  If you have any questions or concerns, please don't hesitate to send me a message via MyChart or call the office at [phone]. Thank you for visiting with Denise Rice today! It's our pleasure caring for you.

## 2019-11-07 ENCOUNTER — Telehealth: Payer: Self-pay | Admitting: Family Medicine

## 2019-11-07 NOTE — Telephone Encounter (Signed)
TY

## 2019-11-07 NOTE — Telephone Encounter (Signed)
Called to remind pt of MyChart visit tomorrow, pt confirmed and wanted me to go ahead and make note that she is currently not able to afford her Ajovy prescription and will need to discuss options for something cheaper. Pt will log on to MyChart tomorrow at least 10 minutes early and wait for provider.

## 2019-11-07 NOTE — Progress Notes (Addendum)
PATIENT: Denise Rice DOB: 1950/01/30  REASON FOR VISIT: follow up HISTORY FROM: patient  Virtual Visit via Telephone Note  I connected with Denise Rice on 11/08/19 at 10:30 AM EDT by telephone and verified that I am speaking with the correct person using two identifiers.   I discussed the limitations, risks, security and privacy concerns of performing an evaluation and management service by telephone and the availability of in person appointments. I also discussed with the patient that there may be a patient responsible charge related to this service. The patient expressed understanding and agreed to proceed.   History of Present Illness:  11/08/19 Denise Rice is a 69 y.o. female here today for follow up for migraines. She continues Ajovy monthly and now taking rizatriptan for abortive therapy. Tramadol discontinued s/p ER evaluation with concerns for polypharmacy. She is follwed closely by Dr Rosendo Gros. Vertigo is well managed on Valium daily. She reports 6-7 headaches per month on average.  She uses rizatriptan 2-3 times per month.  She has retired from [employer]. She reports being angry, depressed and anxious since. She has a visit scheduled with psychology next month. Her husband has end stage renal disease. Her daughter is living in [place]. She wishes to return to work. She is applying with [employer] and [employer].    History (copied form my note on 11/09/2018)  Denise Rice is a 69 y.o. female here today for follow up for migraines. She reports that headaches are stable on Ajovy monthly. She has really enjoyed this medication. Her only concern is that headaches seem to return the week prior to her next dose. She usually takes Tylenol for abortive therapy but states that sometimes this is not effective. She was previously taking tramadol for migraines unresponsive to Tylenol. There was previous concern of seizure activity and triptan therapy has been avoided. She is doing very well today and without  concerns.  HISTORY: (copied from Dr Cathren Laine note on 07/02/2016)  Interval update 07/02/2016:Denise Rice is a lovely 68 y.o. female here as a referral from Dr. 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 Headaches occur on the right side. She noted some scalp sensitivity especially with brushing her hair. She reported nausea but no vomiting. Also blurry vision with her headaches. She has photophobia. Dizziness. Since then the headaches have resolved.  She is here with a new issue. She fell out of bed and woke up on the floor, she didn't even know she had fell out of bed. She was in such pain it took her 20 minutes to get of fof the bed, this happened Monday early morning. She is still in pain, she is having trouble walking, bending, getting up and sitting down. She denies any head trauma or known bruising. She hurts badly from her buttocks down her legs and in her neck. A lot of stiffness. She feels like she ran a marathon. She went to bed at 10pm and woke up at 2am so maximum 4 hours. Her bedsheets were on the bed. She is not mentally impaired, just physically she feels like he is 40. She is using a cane. No one particular spot just all over musculoskeletal pain. Lots of stress. She takes [place] and xanax at night but she has been doing this for years nothing new, no alcohol. Advil helps with pain. She is going to get  a massage.  Interval Update 12/18/2014:Denise Rice is a lovely 69 y.o. female here as a referral from Dr. 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 She had her surgery. After about 8 weeks her hearing improved and her vertigo improved. She is in rehab for her vertigo. She is hearing better. She tires very easily. No falls. She still has dizziness but nbithing significant.  HPI:  Denise Rice is a lovely 69 y.o. female here as a referral from Dr. 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 She has had multiple falls, most recently in her kitchen. She does not remember falling. She was facing the fridge and truned around and next thing she remembers she was on the ground. She did not proceed to the ED. She had a split lip and there was blood everywhere. Her nose is hurting on the bridge but reports no further alteration of consciousness or confusion. She has been having headaches since hitting her head but they are improving. No irritability, no decreased concentration, no increase in dizziness, no further memory changes or other post-concussive symptma. The headaches feel dull throbbing. She is better, hasn;t had one in a few days. This is the first time she has fallen and has no recollection of the fall. Discussed at length, it appears as though she may have hit her head pretty hard on the floor and possibly cause some memory loss. I did encourage her to go to the emergency room next time for evaluation of intracranial hematomas as this can be a serious complication of falls.   Patient is scheduled for repair of the semicircular canals. She has been told she may be dizzier afterwards but over the long term her surgeon is hopeful it will help. She had her first surgery in 1970. She is using a cane. She feels good today. Yesterday she also felt well. Walking makes her feel worse. The Xanax 1mg  twice a day helps a lot for the disequilibrium. The scopolamine patch helped but was too expensive. She will be admitted on July [facility]nd [facility]. She takes [place] every night. Discussed that at this time it's important for her to concentrate on her surgery and get better. After her surgery and recovery, we can discuss the Xanax and Ambien in the evenings and see if we can decrease that. If she has any more episodes of falls with loss of  consciousness. May need a cardiac evaluation for abnormal arrhythmias  Observations/Objective:  Generalized: Well developed, in no acute distress  Mentation: Alert oriented to time, place, history taking. Follows all commands speech and language fluent   Assessment and Plan:  69 y.o. 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 here with    ICD-10-CM   1. Chronic migraine without aura without status migrainosus, not intractable  G43.709   2. Chronic vertigo  R42   3. Anxiety and depression  F41.9    F32.A     Denise Rice is doing very well from a migraine  perspective.  We will continue Ajovy injections every month and rizatriptan as needed for abortive therapy.  She reports being notified of increased cost of one of her medications, most likely Ajovy, but she has not reached out to the pharmacy to verify.  We will consider changing CGRP to Emgality if preferred on her current Medicare plan.  We will avoid Aimovig due to previous history of bowel obstruction and gastric bypass due to concerns of constipation.  We have reviewed recommended administration of rizatriptan.  She is followed very closely by primary care.  She is doing much better on current medication regimen.  Sedative side effects seem to have resolved.  We have discussed concerns of anxiety and depression.  She is scheduled to meet with psychology next month.  I  have encouraged her to continue with those plans as I feel this will be very beneficial for her.  Healthy lifestyle habits encouraged.  She will follow-up with me in 1 year, sooner if needed.  She verbalizes understanding and agreement with this plan.   No orders of the defined types were placed in this encounter.   No orders of the defined types were placed in this encounter.    Follow Up Instructions:  I discussed the assessment and treatment plan with the patient. The patient was provided an opportunity to ask questions and all were answered. The patient agreed with the plan and demonstrated an understanding of the instructions.   The patient was advised to call back or seek an in-person evaluation if the symptoms worsen or if the condition fails to improve as anticipated.  I provided 25 minutes of non-face-to-face time during this encounter.  Patient is located at her place of residence during my chart visit.  Providers in the office.   Debbora Presto, NP   Made any corrections needed, and agree with history, physical, neuro exam,assessment and plan as stated.     Sarina Ill, MD [facility]

## 2019-11-08 ENCOUNTER — Encounter: Payer: Self-pay | Admitting: Family Medicine

## 2019-11-08 ENCOUNTER — Telehealth (INDEPENDENT_AMBULATORY_CARE_PROVIDER_SITE_OTHER): Payer: Medicare Other | Admitting: Family Medicine

## 2019-11-08 DIAGNOSIS — G43709 Chronic migraine without aura, not intractable, without status migrainosus: Secondary | ICD-10-CM | POA: Diagnosis not present

## 2019-11-08 DIAGNOSIS — F419 Anxiety disorder, unspecified: Secondary | ICD-10-CM

## 2019-11-08 DIAGNOSIS — R42 Dizziness and giddiness: Secondary | ICD-10-CM

## 2019-11-08 DIAGNOSIS — F32A Depression, unspecified: Secondary | ICD-10-CM

## 2019-11-09 ENCOUNTER — Encounter: Payer: Self-pay | Admitting: Family Medicine

## 2019-11-11 ENCOUNTER — Ambulatory Visit (INDEPENDENT_AMBULATORY_CARE_PROVIDER_SITE_OTHER): Payer: Self-pay | Admitting: Plastic Surgery

## 2019-11-11 ENCOUNTER — Other Ambulatory Visit: Payer: Self-pay

## 2019-11-11 ENCOUNTER — Encounter: Payer: Self-pay | Admitting: Plastic Surgery

## 2019-11-11 VITALS — BP 118/79 | HR 72 | Temp 98.1°F

## 2019-11-11 DIAGNOSIS — Z719 Counseling, unspecified: Secondary | ICD-10-CM

## 2019-11-11 NOTE — Progress Notes (Signed)
The patient is a 69 year old female here for follow-up after undergoing a mini facelift in the office.  Overall she is doing very well.  She feels like she has a little bit of gelling and excess tissue at the nasolabial folds.  She does remember me stating that the mini facelift would likely result in this over time.  Otherwise she is doing well.  She has lost about 40 pounds and looks great.  She has a little bit of redness behind the left ear.  With a little thinning of the skin.  This appears to be more irritation.  It does not appear to be infected.  I would like see her back in 1 month.  Pictures were obtained of the patient and placed in the chart with the patient's or guardian's permission.

## 2019-11-16 ENCOUNTER — Telehealth: Payer: Self-pay | Admitting: *Deleted

## 2019-11-16 MED ORDER — EMGALITY 120 MG/ML ~~LOC~~ SOAJ: 2.0000 "pen " | SUBCUTANEOUS | 11 refills | Status: DC

## 2019-11-16 NOTE — Telephone Encounter (Signed)
Initiated CMM KEY [id number] for Emgality 120mg  ml auto IJ.  DX G43.709. tried topamax, gabapentin, ajovy, zonegran, tramadol, lexapro, tizanidine. Rizatriptan. [employer] ID [id number] BIN [id number], GRP [id number], PCN [id number].

## 2019-11-16 NOTE — Telephone Encounter (Signed)
Change to emgality per insurance

## 2019-11-17 ENCOUNTER — Encounter: Payer: Self-pay | Admitting: *Deleted

## 2019-11-17 NOTE — Addendum Note (Signed)
Addended by: Minna Antis on: 11/17/2019 07:07 AM   Modules accepted: Orders

## 2019-11-17 NOTE — Telephone Encounter (Signed)
[employer] approved Emgality until 02/14/2020. Approval letter faxed to pharmacy, patient notified via my chart.

## 2019-11-21 ENCOUNTER — Other Ambulatory Visit: Payer: Self-pay

## 2019-11-21 ENCOUNTER — Ambulatory Visit (INDEPENDENT_AMBULATORY_CARE_PROVIDER_SITE_OTHER): Payer: Self-pay

## 2019-11-21 VITALS — BP 122/75 | HR 72 | Temp 97.1°F | Ht 61.0 in | Wt 137.0 lb

## 2019-11-21 DIAGNOSIS — Z719 Counseling, unspecified: Secondary | ICD-10-CM

## 2019-11-21 MED ORDER — BACLOFEN 10 MG PO TABS
5.0000 mg | ORAL_TABLET | Freq: Three times a day (TID) | ORAL | 3 refills | Status: DC | PRN
  Filled 2020-04-23: qty 90, 30d supply, fill #0
  Filled 2020-06-22: qty 90, 30d supply, fill #1
  Filled 2020-07-17: qty 90, 30d supply, fill #2
  Filled 2020-07-28: qty 90, 30d supply, fill #3

## 2019-11-23 ENCOUNTER — Encounter: Payer: Self-pay | Admitting: Family Medicine

## 2019-11-24 MED ORDER — RIZATRIPTAN BENZOATE 10 MG PO TBDP: 10.0000 mg | ORAL_TABLET | ORAL | 11 refills | Status: DC | PRN

## 2019-12-12 ENCOUNTER — Ambulatory Visit
Admission: RE | Admit: 2019-12-12 | Discharge: 2019-12-12 | Disposition: A | Discharge status: Home / Self Care | Payer: Medicare Other | Source: Ambulatory Visit | Attending: Family Medicine | Admitting: Family Medicine

## 2019-12-12 ENCOUNTER — Other Ambulatory Visit: Payer: Self-pay

## 2019-12-12 DIAGNOSIS — Z1231 Encounter for screening mammogram for malignant neoplasm of breast: Secondary | ICD-10-CM

## 2019-12-13 ENCOUNTER — Ambulatory Visit (INDEPENDENT_AMBULATORY_CARE_PROVIDER_SITE_OTHER): Payer: Medicare Other | Admitting: Psychology

## 2019-12-13 DIAGNOSIS — F4323 Adjustment disorder with mixed anxiety and depressed mood: Secondary | ICD-10-CM

## 2019-12-13 NOTE — Progress Notes (Signed)
Preoperative Dx: facial aging/hyperpigmentation areas on upper cheeks & forehead  Postoperative Dx:  same  Procedure: laser to face  Anesthesia: EMLA applied 30 mins prior to procedure  Description of Procedure:  Risks and complications were explained to the patient. Consent was confirmed and signed. Time out was called and all information was confirmed to be correct. The area  was prepped with alcohol and wiped dry.  The IPL laser was set at the following:  skin -1/ suntan light -  7.0J  To upper cheeks then reduced to 6.0J to forehead  FRAX laser was set at skin 1- sun-light = 40.0/25.0 coverage & 3 passes to rest of the face  The entire face was lasered The patient tolerated the procedure well and there were no complications.   Aloe & post laser balm  applied after procedure She is reminded to protect skin with sunscreen & moisturizer & avoid retina products & no abrasive scrubs for approx. 10 days  She is reminded that she may experience redness/peeling & irritation Patient to f/u in 6 weeks- she plans to have a 2nd FRAX laser procedure at her next appointment   She will call for any concerns [place]

## 2019-12-13 NOTE — Patient Instructions (Signed)
Pt will use sunscreen & use moisturizer as needed.- Aloe & post laser balm provided to pt to use as needed. She will avoid retinol products and no harsh scrubs/cleansers for approx 2 weeks. She understands that she may experience dry/peeling/swollen skin. She will call for any concerns F/U in 6 weeks- may repeat the laser procedures at that time.

## 2019-12-20 ENCOUNTER — Encounter: Payer: Self-pay | Admitting: Family Medicine

## 2019-12-20 ENCOUNTER — Ambulatory Visit (INDEPENDENT_AMBULATORY_CARE_PROVIDER_SITE_OTHER): Payer: Medicare Other | Admitting: Psychology

## 2019-12-20 DIAGNOSIS — F4323 Adjustment disorder with mixed anxiety and depressed mood: Secondary | ICD-10-CM

## 2019-12-27 ENCOUNTER — Telehealth: Payer: Self-pay

## 2019-12-27 ENCOUNTER — Ambulatory Visit (INDEPENDENT_AMBULATORY_CARE_PROVIDER_SITE_OTHER): Payer: Medicare Other | Admitting: Psychology

## 2019-12-27 DIAGNOSIS — F4323 Adjustment disorder with mixed anxiety and depressed mood: Secondary | ICD-10-CM

## 2019-12-27 NOTE — Telephone Encounter (Signed)
Please schedule pt Thursday at 10 to discuss sleeping issues. Ok per Dr. Jonni Sanger   Pt stated on phone call she has been sleeping 2 hours a night.

## 2019-12-27 NOTE — Telephone Encounter (Signed)
Discussed with Dr. Jonni Sanger. Closing per Dr. Jonni Sanger

## 2019-12-27 NOTE — Telephone Encounter (Signed)
Patient has been scheduled

## 2019-12-27 NOTE — Telephone Encounter (Signed)
Pt states she desperately needs to talk to Dr. Jonni Sanger. She states she just needs a little virtual visit. It is in regards to her appt with Trey Paula.

## 2019-12-29 ENCOUNTER — Ambulatory Visit (INDEPENDENT_AMBULATORY_CARE_PROVIDER_SITE_OTHER): Payer: PRIVATE HEALTH INSURANCE | Admitting: Family Medicine

## 2019-12-29 ENCOUNTER — Other Ambulatory Visit (HOSPITAL_COMMUNITY): Payer: Self-pay | Admitting: Family Medicine

## 2019-12-29 ENCOUNTER — Other Ambulatory Visit: Payer: Self-pay

## 2019-12-29 ENCOUNTER — Encounter: Payer: Self-pay | Admitting: Family Medicine

## 2019-12-29 VITALS — BP 140/78 | HR 71 | Temp 98.2°F | Wt 134.6 lb

## 2019-12-29 DIAGNOSIS — K219 Gastro-esophageal reflux disease without esophagitis: Secondary | ICD-10-CM

## 2019-12-29 DIAGNOSIS — F5102 Adjustment insomnia: Secondary | ICD-10-CM | POA: Diagnosis not present

## 2019-12-29 DIAGNOSIS — Z9884 Bariatric surgery status: Secondary | ICD-10-CM

## 2019-12-29 DIAGNOSIS — F329 Major depressive disorder, single episode, unspecified: Secondary | ICD-10-CM

## 2019-12-29 DIAGNOSIS — F4321 Adjustment disorder with depressed mood: Secondary | ICD-10-CM

## 2019-12-29 MED ORDER — TRAZODONE HCL 100 MG PO TABS
100.0000 mg | ORAL_TABLET | Freq: Every day | ORAL | 3 refills | Status: DC
Start: 2019-12-29 — End: 2020-08-07

## 2019-12-29 MED ORDER — OMEPRAZOLE 20 MG PO CPDR: 20.0000 mg | DELAYED_RELEASE_CAPSULE | Freq: Every day | ORAL | 3 refills | Status: DC

## 2019-12-29 NOTE — Progress Notes (Signed)
Subjective  CC:  Chief Complaint  Patient presents with  . Insomnia    Recent sessions with Dr. Trey Paula has increased insomnia, 3-4 hours nightly, wanting to restart Ambien or another sleep aid until she has her NP appt with [facility] in Feb 2022 If any medication is called in today, please send to [facility] on [address]     HPI: Denise Rice is a 69 y.o. female who presents to the office today to address the problems listed above in the chief complaint.  Denise Rice returns due to problems sleeping.  She has chronic insomnia that had been on trazodone.  She takes 50 mg nightly.  However she has increased stressors related to psychotherapy, chronic grief reaction, her spouse is on dialysis and is contemplating quitting, unemployed.  She takes medications for depression.  She has sought out the help of a psychiatrist but cannot get until February.  She has had a history of polypharmacy with mental status changes in the recent past.  She is done better with the multiple changes we have made.  She is alert no longer lethargic.  Sleep has been a chronic difficult problem for her but has worsened over the last several weeks.  Also is losing weight, unintentional.  She is experiencing nausea and GERD symptoms after meals.  She is using Pepto-Bismol frequently.  She had been on a PPI in the past.  She is status post gastric bypass.  No sharp, severe or chronic abdominal pain.  No blood in stool.    Assessment  1. Adjustment insomnia   2. 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 Gastroesophageal reflux disease, unspecified whether esophagitis present   4. Complicated grief   5. Major depression, chronic      Plan   Primary insomnia with adjustment insomnia reaction due to stressors and active depression: Recommend increasing trazodone dose.  Will increase to 100 450 mg nightly.  No new medications ordered.  Continue Lexapro.  He will follow-up with psychiatry in February.  Continue counseling.  GERD: Start  omeprazole 20 mg daily.  Weight loss: Hopefully we get her good control she can eat better.  Follow up: As scheduled  02/01/2020  No orders of the defined types were placed in this encounter.  Meds ordered this encounter  Medications  . traZODone (DESYREL) 100 MG tablet    Sig: Take 1-1.5 tablets (100-150 mg total) by mouth at bedtime.    Dispense:  180 tablet    Refill:  3  . omeprazole (PRILOSEC) 20 MG capsule    Sig: Take 1 capsule (20 mg total) by mouth daily.    Dispense:  90 capsule    Refill:  3      I reviewed the patients updated PMH, FH, and SocHx.    Patient Active Problem List   Diagnosis Date Noted  . Bilateral sensorineural hearing loss 09/06/2014    Priority: High  . Chronic migraine 05/04/2019    Priority: Medium  . History of small bowel obstruction 11/17/2017    Priority: Medium  . Chronic vertigo 03/11/2017    Priority: Medium  . Major depression, chronic 03/11/2017    Priority: Medium  . PCOS (polycystic ovarian syndrome) 03/11/2017    Priority: Medium  . Insomnia 09/08/2015    Priority: Medium  . 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 History of laparoscopic adjustable gastric banding 01/22/2011    Priority: Medium  . Lactose intolerance 12/22/2018    Priority:  Low  . 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 Vitamin D deficiency 01/15/2008    Priority: Low  . Encounter for counseling 03/08/2019  . Vitamin B12 deficiency 07/29/2018  . Anxiety 03/14/2018  . Elschnig bodies following cataract surgery, 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   Current Meds  Medication Sig  . baclofen (LIORESAL) 10 MG tablet Take 0.5-1 tablets (5-10 mg total) by mouth 3 (three) times daily as needed for muscle spasms.  . Biotin 5000 MCG CAPS Take 5,000 mcg by mouth daily.   . busPIRone (BUSPAR) 7.5 MG tablet Take 1 tablet (7.5 mg total) by mouth daily as needed. for anxiety  . cholecalciferol (VITAMIN D3) 25 MCG  (1000 UT) tablet Take 1,000 Units by mouth daily.  . cyanocobalamin (,VITAMIN B-12,) 1000 MCG/ML injection INJECT 1 ML INTO THE MUSCLE EVERY 30 DAYS  . cyanocobalamin 2000 MCG tablet Take 2,000 mcg by mouth daily.  . diazepam (VALIUM) 10 MG tablet Take 1 tablet (10 mg total) by mouth daily.  Marland Kitchen escitalopram (LEXAPRO) 20 MG tablet TAKE 1 TABLET BY MOUTH ONCE A DAY  . ferrous sulfate 325 (65 FE) MG tablet Take 325 mg by mouth daily with breakfast.  . Galcanezumab-gnlm (EMGALITY) 120 MG/ML SOAJ Inject 2 pens into the skin every 30 (thirty) days.  . Multiple Vitamin (MULTIVITAMIN WITH MINERALS) TABS tablet Take 1 tablet by mouth daily.  . rizatriptan (MAXALT-MLT) 10 MG disintegrating tablet Take 1 tablet (10 mg total) by mouth as needed for migraine. May repeat in 2 hours if needed  . Syringe/Needle, Disp, (SYRINGE 3CC/25GX1") 25G X 1" 3 ML MISC Use once monthly for injection of Viatmin B-12  . [DISCONTINUED] traZODone (DESYREL) 50 MG tablet Take 1 tablet (50 mg total) by mouth at bedtime.    Allergies: Patient is allergic to topiramate. Family History: Patient family history includes Anxiety disorder in her mother; Cancer in her father, paternal aunt, and paternal uncle; Diabetes in her father, maternal aunt, mother, and sister; Heart attack in her mother; Heart disease in her father, maternal aunt, and mother; Hyperlipidemia in her father and mother; Hypertension in her father and mother; Kidney disease in her father and mother; Obesity in her father and mother; Stroke in her father, mother, and sister; Thyroid disease in her mother. Social History:  Patient  reports that she has never smoked. She has never used smokeless tobacco. She reports that she does not drink alcohol and does not use drugs.  Review of Systems: Constitutional: Negative for fever malaise or anorexia Cardiovascular: negative for chest pain Respiratory: negative for SOB or persistent cough Gastrointestinal: negative for  abdominal pain  Objective  Vitals: BP 140/78   Pulse 71   Temp 98.2 F (36.8 C) (Temporal)   Wt 134 lb 9.6 oz (61.1 kg)   LMP 01/13/2005   SpO2 99%   BMI 25.43 kg/m  General: no acute distress , A&Ox3 Psych: Well groomed, good eye contact, fairly good insight.  Normal affect     Commons side effects, risks, benefits, and alternatives for medications and treatment plan prescribed today were discussed, and the patient expressed understanding of the given instructions. Patient is instructed to call or message via MyChart if he/she has any questions or concerns regarding our treatment plan. No barriers to understanding were identified. We discussed Red Flag symptoms and signs in detail. Patient expressed understanding regarding what to do in case of urgent or emergency type symptoms.   Medication list was reconciled, printed  and provided to the patient in AVS. Patient instructions and summary information was reviewed with the patient as documented in the AVS. This note was prepared with assistance of Dragon voice recognition software. Occasional wrong-word or sound-a-like substitutions may have occurred due to the inherent limitations of voice recognition software  This visit occurred during the SARS-CoV-2 public health emergency.  Safety protocols were in place, including screening questions prior to the visit, additional usage of staff PPE, and extensive cleaning of exam room while observing appropriate contact time as indicated for disinfecting solutions.

## 2019-12-29 NOTE — Patient Instructions (Signed)
Please follow up as scheduled for your next visit with me: 02/01/2020   If you have any questions or concerns, please don't hesitate to send me a message via MyChart or call the office at [phone]. Thank you for visiting with [place] today! It's our pleasure caring for you.

## 2020-01-02 ENCOUNTER — Other Ambulatory Visit: Payer: Self-pay

## 2020-01-02 ENCOUNTER — Ambulatory Visit (INDEPENDENT_AMBULATORY_CARE_PROVIDER_SITE_OTHER): Payer: Self-pay

## 2020-01-02 VITALS — BP 130/78 | HR 72 | Temp 97.6°F | Ht 61.0 in | Wt 136.0 lb

## 2020-01-02 DIAGNOSIS — Z719 Counseling, unspecified: Secondary | ICD-10-CM

## 2020-01-03 ENCOUNTER — Encounter: Payer: Self-pay | Admitting: Family Medicine

## 2020-01-03 ENCOUNTER — Telehealth (INDEPENDENT_AMBULATORY_CARE_PROVIDER_SITE_OTHER): Payer: Medicare Other | Admitting: Family Medicine

## 2020-01-03 DIAGNOSIS — R059 Cough, unspecified: Secondary | ICD-10-CM

## 2020-01-03 DIAGNOSIS — R110 Nausea: Secondary | ICD-10-CM | POA: Diagnosis not present

## 2020-01-03 DIAGNOSIS — R197 Diarrhea, unspecified: Secondary | ICD-10-CM | POA: Diagnosis not present

## 2020-01-03 MED ORDER — AZELASTINE HCL 0.1 % NA SOLN: 2.0000 | Freq: Two times a day (BID) | NASAL | 12 refills | Status: DC

## 2020-01-03 MED ORDER — ONDANSETRON 4 MG PO TBDP: 4.0000 mg | ORAL_TABLET | Freq: Three times a day (TID) | ORAL | 0 refills | Status: DC | PRN

## 2020-01-03 NOTE — Progress Notes (Signed)
   Denise Rice is a 69 y.o. female who presents today for a virtual office visit.  Assessment/Plan:  New/Acute Problems: Cough / congestion / Nausea/ Diarrhea Consistent withv viral infection.  Recent Covid testing was negative.  It is possible this could be influenza however patient is out of window for Tamiflu does not have any red flag signs or symptoms.  Testing at this point would not significantly change management.  It is also possible that she could have a GI illness as well.  Given the symptoms seem to be improving over the last day or so, and the fact that she does not have any red flag signs or symptoms we will proceed with symptomatic management.  We will start Astelin and Zofran.  Encourage good oral hydration.  Can use over-the-counter analgesics as needed.  Discussed reasons to return to care.  Follow-up as needed.    Subjective:  HPI:  Patient with a few daysdays of flu like symptoms cough and congestion.  She was tested for Covid yesterday which was negative.  He has had some chills.  No obvious fevers.  He has had significant diarrhea for the last couple days as well.  No known sick contacts.  She has had more malaise and myalgias.  No specific treatments tried.  No shortness of breath.  No chest pain.  No abdominal pain.  No hematochezia.       Objective/Observations  Physical Exam: Gen: NAD, resting comfortably Pulm: Normal work of breathing Neuro: Grossly normal, moves all extremities Psych: Normal affect and thought content  Virtual Visit via Video   I connected with [name] on 01/03/20 at  3:40 PM EST by a video enabled telemedicine application and verified that I am speaking with the correct person using two identifiers. The limitations of evaluation and management by telemedicine and the availability of in person appointments were discussed. The patient expressed understanding and agreed to proceed.   Patient location: Home Provider location: [place] participating in the virtual visit: Myself and Patient     Algis Greenhouse. Jerline Pain, MD 01/03/2020 3:54 PM

## 2020-02-01 ENCOUNTER — Encounter: Payer: Self-pay | Admitting: Family Medicine

## 2020-02-01 ENCOUNTER — Other Ambulatory Visit: Payer: Self-pay

## 2020-02-01 ENCOUNTER — Telehealth (INDEPENDENT_AMBULATORY_CARE_PROVIDER_SITE_OTHER): Payer: Medicare HMO | Admitting: Family Medicine

## 2020-02-01 DIAGNOSIS — F419 Anxiety disorder, unspecified: Secondary | ICD-10-CM

## 2020-02-01 DIAGNOSIS — F329 Major depressive disorder, single episode, unspecified: Secondary | ICD-10-CM | POA: Diagnosis not present

## 2020-02-01 DIAGNOSIS — F5102 Adjustment insomnia: Secondary | ICD-10-CM | POA: Diagnosis not present

## 2020-02-01 DIAGNOSIS — Z9189 Other specified personal risk factors, not elsewhere classified: Secondary | ICD-10-CM

## 2020-02-01 MED ORDER — DIAZEPAM 10 MG PO TABS
10.0000 mg | ORAL_TABLET | Freq: Every day | ORAL | 5 refills | Status: DC
Start: 2020-02-01 — End: 2020-03-26

## 2020-02-01 NOTE — Progress Notes (Signed)
Virtual Visit via Video Note  Subjective  CC:  Chief Complaint  Patient presents with  . adjustment insomnia    Has noticed improvement with increased trazodone     I connected with Denise Rice on 02/01/20 at  9:00 AM EST by a video enabled telemedicine application and verified that I am speaking with the correct person using two identifiers. Location patient: Home Location provider: [facility], Office Persons participating in the virtual visit: Denise Rice, Denise Arnt, MD Denise Rice CMA  I discussed the limitations of evaluation and management by telemedicine and the availability of in person appointments. The patient expressed understanding and agreed to proceed. HPI: Denise Rice is a 70 y.o. female who was contacted today to address the problems listed above in the chief complaint. . Adjustment insomnia: Denise Rice is now using 150 mg of trazodone nightly.  She is also using Valium 10 mg nightly.  She reports that the pharmacy, Denise Rice, refilled her Valium at the twice daily dosing.  This was not intentional by me.  We decreased her Valium down to 10 mg dosing.  However patient would like to stay at twice daily dosing because she feels it helps her sleep better.  She denies mental status changes, lethargy or confusion. . Depression with anxiety: Continues to see a Social worker.  Reports she is feeling "great" .  She is on Lexapro 20 mg daily.  She also takes BuSpar twice a day.  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  She is excited about this new career path.  Stressors at home continue, her husband continues on dialysis due to end-stage renal disease. Marland Kitchen She will reschedule her complete physical . We reviewed her medications in detail. Assessment  1. Adjustment insomnia   2. Major depression, chronic   3. Anxiety   4. At risk for polypharmacy      Plan   Adjustment insomnia: Now resolved.  Continues with  counseling.  Using Valium at night along with trazodone.  Cautioned against Valium use.  I prefer once daily dosing.  Will monitor closely.  Depression and anxiety: Much improved from last month.  Mood tends to cycle quickly.  Continue with therapy  At risk for polypharmacy: Continue BuSpar twice daily, Valium twice daily, Lexapro.  She is not on any other mood medication at this time. I discussed the assessment and treatment plan with the patient. The patient was provided an opportunity to ask questions and all were answered. The patient agreed with the plan and demonstrated an understanding of the instructions.   The patient was advised to call back or seek an in-person evaluation if the symptoms worsen or if the condition fails to improve as anticipated. Follow up: Reschedule complete physical with lab work. Visit date not found  Meds ordered this encounter  Medications  . diazepam (VALIUM) 10 MG tablet    Sig: Take 1 tablet (10 mg total) by mouth daily.    Dispense:  30 tablet    Refill:  5      I reviewed the patients updated PMH, FH, and SocHx.    Patient Active Problem List   Diagnosis Date Noted  . Bilateral sensorineural hearing loss 09/06/2014    Priority: High  . Chronic migraine 05/04/2019    Priority: Medium  . History of small bowel obstruction 11/17/2017    Priority: Medium  . Chronic vertigo 03/11/2017  Priority: Medium  . Major depression, chronic 03/11/2017    Priority: Medium  . PCOS (polycystic ovarian syndrome) 03/11/2017    Priority: Medium  . Insomnia 09/08/2015    Priority: Medium  . 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 History of laparoscopic adjustable gastric banding 01/22/2011    Priority: Medium  . Lactose intolerance 12/22/2018    Priority: Low  . 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 Vitamin D deficiency 01/15/2008    Priority: Low  . At risk for polypharmacy 02/01/2020   . Vitamin B12 deficiency 07/29/2018  . Anxiety 03/14/2018  . Elschnig bodies following cataract surgery, 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   Current Meds  Medication Sig  . azelastine (ASTELIN) 0.1 % nasal spray Place 2 sprays into both nostrils 2 (two) times daily.  . baclofen (LIORESAL) 10 MG tablet Take 0.5-1 tablets (5-10 mg total) by mouth 3 (three) times daily as needed for muscle spasms.  . Biotin 5000 MCG CAPS Take 5,000 mcg by mouth daily.   . busPIRone (BUSPAR) 7.5 MG tablet Take 1 tablet (7.5 mg total) by mouth daily as needed. for anxiety  . cholecalciferol (VITAMIN D3) 25 MCG (1000 UT) tablet Take 1,000 Units by mouth daily.  . cyanocobalamin (,VITAMIN B-12,) 1000 MCG/ML injection INJECT 1 ML INTO THE MUSCLE EVERY 30 DAYS  . cyanocobalamin 2000 MCG tablet Take 2,000 mcg by mouth daily.  Marland Kitchen escitalopram (LEXAPRO) 20 MG tablet TAKE 1 TABLET BY MOUTH ONCE A DAY  . ferrous sulfate 325 (65 FE) MG tablet Take 325 mg by mouth daily with breakfast.  . Multiple Vitamin (MULTIVITAMIN WITH MINERALS) TABS tablet Take 1 tablet by mouth daily.  Marland Kitchen omeprazole (PRILOSEC) 20 MG capsule Take 1 capsule (20 mg total) by mouth daily.  . ondansetron (ZOFRAN ODT) 4 MG disintegrating tablet Take 1 tablet (4 mg total) by mouth every 8 (eight) hours as needed for nausea or vomiting.  . rizatriptan (MAXALT-MLT) 10 MG disintegrating tablet Take 1 tablet (10 mg total) by mouth as needed for migraine. May repeat in 2 hours if needed  . Syringe/Needle, Disp, (SYRINGE 3CC/25GX1") 25G X 1" 3 ML MISC Use once monthly for injection of Viatmin B-12  . traZODone (DESYREL) 100 MG tablet Take 1-1.5 tablets (100-150 mg total) by mouth at bedtime.  . [DISCONTINUED] diazepam (VALIUM) 10 MG tablet Take 1 tablet (10 mg total) by mouth daily.    Allergies: Patient is allergic to topiramate. Family History: Patient family history includes Anxiety disorder in her mother; Cancer in her father, paternal aunt, and paternal uncle; Diabetes  in her father, maternal aunt, mother, and sister; Heart attack in her mother; Heart disease in her father, maternal aunt, and mother; Hyperlipidemia in her father and mother; Hypertension in her father and mother; Kidney disease in her father and mother; Obesity in her father and mother; Stroke in her father, mother, and sister; Thyroid disease in her mother. Social History:  Patient  reports that she has never smoked. She has never used smokeless tobacco. She reports that she does not drink alcohol and does not use drugs.  Review of Systems: Constitutional: Negative for fever malaise or anorexia Cardiovascular: negative for chest pain Respiratory: negative for SOB or persistent cough Gastrointestinal: negative for abdominal pain  OBJECTIVE Vitals: LMP 01/13/2005  General: no acute distress , A&Ox3 Psych: Normal speech, normal cognition, appears well  Denise Arnt, MD

## 2020-02-02 ENCOUNTER — Encounter: Payer: Self-pay | Admitting: Plastic Surgery

## 2020-02-06 NOTE — Progress Notes (Signed)
Preoperative Dx: facial aging  Postoperative Dx:  same  Procedure: laser to face  Anesthesia: EMLA -pt applied 30 mins prior to procedure  Description of Procedure:  Risks and complications were explained to the patient. Consent was confirmed and signed. Time out was called and all information was confirmed to be correct. The area was prepped with alcohol and wiped dry. The FRAX laser was set at the following:  skin -1/ sun -0    40.0/25.0 & 3 passes  The entire face was lasered The patient tolerated the procedure well and there were no complications.  Aloe gel was applied She is reminded to protect skin with sunscreen & moisturizer & avoid retina products & no abrasive scrubs for approx. 7 to 10 days  She is reminded that she may experience redness/peeling & irritation Patient to f/u in 6 weeks She will call for any concerns

## 2020-02-06 NOTE — Patient Instructions (Signed)
Pt is reminded to use sunscreen/moisturizer She will avoid harsh scrubs/retinol products She understands that she may experience redness/irritation & some peeling. She will f/u in 6 weeks Call for any concerns

## 2020-02-07 ENCOUNTER — Ambulatory Visit (INDEPENDENT_AMBULATORY_CARE_PROVIDER_SITE_OTHER): Payer: Medicare HMO | Admitting: Psychology

## 2020-02-07 DIAGNOSIS — F4323 Adjustment disorder with mixed anxiety and depressed mood: Secondary | ICD-10-CM

## 2020-02-09 ENCOUNTER — Other Ambulatory Visit: Payer: Self-pay

## 2020-02-09 ENCOUNTER — Ambulatory Visit (INDEPENDENT_AMBULATORY_CARE_PROVIDER_SITE_OTHER): Payer: Self-pay

## 2020-02-09 VITALS — BP 121/78 | HR 73 | Temp 97.7°F | Ht 61.0 in | Wt 136.0 lb

## 2020-02-09 DIAGNOSIS — Z719 Counseling, unspecified: Secondary | ICD-10-CM

## 2020-02-16 ENCOUNTER — Encounter: Payer: Self-pay | Admitting: Family Medicine

## 2020-02-20 ENCOUNTER — Other Ambulatory Visit: Payer: Medicare Other

## 2020-02-21 ENCOUNTER — Ambulatory Visit (INDEPENDENT_AMBULATORY_CARE_PROVIDER_SITE_OTHER): Payer: Medicare HMO | Admitting: Family Medicine

## 2020-02-21 ENCOUNTER — Other Ambulatory Visit: Payer: Self-pay

## 2020-02-21 ENCOUNTER — Encounter: Payer: Self-pay | Admitting: Family Medicine

## 2020-02-21 DIAGNOSIS — Z719 Counseling, unspecified: Secondary | ICD-10-CM

## 2020-02-21 DIAGNOSIS — M6530 Trigger finger, unspecified finger: Secondary | ICD-10-CM | POA: Diagnosis not present

## 2020-02-21 DIAGNOSIS — M25522 Pain in left elbow: Secondary | ICD-10-CM | POA: Diagnosis not present

## 2020-02-21 NOTE — Progress Notes (Signed)
I saw and examined the patient with Dr. Elouise Munroe and agree with assessment and plan as outlined.    Multiple trigger fingers/thumbs, and left medial epicondylitis.  Doing hard workouts at gym.  Will treat with hand PT.  If symptoms persist, injection.

## 2020-02-21 NOTE — Progress Notes (Signed)
Office Visit Note   Patient: Denise Rice           Date of Birth: 1950-03-26           MRN: [id number] Visit Date: 02/21/2020 Requested by: [name], [place],   [id number] PCP: [name], MD  Subjective: Chief Complaint  Patient presents with  . Left Elbow - Pain    "I think I may have sprained my elbow" earlier this week, lifting a heavy gym bag. Pain with flexion.  . Other    Trigger fingers on each hand (I,II & V on the right and I & II on the left). Both hands go numb.     HPI: 70yo F presenting to clinic with concerns of bilateral trigger fingers, affecting multiple fingers on both hands. Patient states that this started several months ago in her thumbs, which get stuck in opposition, and has progressed to involve her bilateral index fingers as well as her right pinkie. She says this is very painful when it happens, and 'I have to get my husband to get my fingers to release.' She is unable to reproduce this catching voluntarily, and states that it seems to happen at random. She does do frequent heavy lifting at the gym, and this seems to make it worse.   Per her elbow, she endorses medial elbow pain which started suddenly when lifting a heavy bag. Pain is located over the medial epicondyle, and worsened with tight grip on weights.               ROS:   All other systems were reviewed and are negative.  Objective: Vital Signs: LMP 01/13/2005   Physical Exam:  General:  Alert and oriented, in no acute distress. Pulm:  Breathing unlabored. Psy:  Normal mood, congruent affect. Skin:  Bilateral hands with no swelling, bruising, or rashes.   Hand Exam:  No obvious swelling, deformity or discoloration.  ROM: Full Active ROM in wrist and all fingers without pain. Normal MCP/PIP/DIP joint flexion and extension of all fingers. No finger rotational abnormality.   Strength: Endorses pain with resisted wrist flexion and pronation on left. No pain  with resisted extension or supination.   Normal Grip strength, Normal thumb abduction and opposition.  Normal finger abduction and adduction, and normal flexor superficialis (PIP) and Profundus strength (DIP).   Palpation: Endorses tenderness over A1 Pulley at bilateral thumbs and index finger. No catching on examination. Also with tenderness over medial epicondyle on left elbow. No snuff box tenderness.  No pain over distal radius or ulna.   Carpal tunnel:  Tinel's Negative.  Sensation intact throughout all fingers Brisk capillary refill.    Imaging: No results found.  Assessment & Plan: 70yo F presenting to clinic with concerns of multiple trigger fingers as well as left medial elbow pain. Examination as above, with some tenderness over A1Pulley region on multiple fingers, though description of thumbs getting stuck in opposition without flexion is somewhat atypical. Medial elbow pain consistent with medial epicondylitis.  - Given numerous fingers involved, as well as elbow concerns, patient would likely benefit from occupational therapy to address all fingers as well as elbow issues.  - If no improvement with occupational therapy, would consider trigger finger injections - Patient expresses understanding with plan, and has no further questions or concerns.       Procedures: No procedures performed        PMFS History: Patient Active Problem List  Diagnosis Date Noted  . At risk for polypharmacy 02/01/2020  . Chronic migraine 05/04/2019  . Lactose intolerance 12/22/2018  . Vitamin B12 deficiency 07/29/2018  . Anxiety 03/14/2018  . Elschnig bodies following cataract surgery, 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  . History of small bowel obstruction 11/17/2017  . Chronic vertigo 03/11/2017  . Major depression, chronic 03/11/2017  . PCOS (polycystic ovarian syndrome) 03/11/2017  . Insomnia 09/08/2015  . Bilateral sensorineural hearing loss 09/06/2014  . 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 History of laparoscopic adjustable gastric banding 01/22/2011  . Vitamin D deficiency 01/15/2008   Past Medical History:  Diagnosis Date  . Anal condyloma   . Anxiety   . Constipation   . Deafness in left ear   . Depression   . Fatigue   . Gallbladder problem   . Hearing loss, sensorineural, high frequency    RIGHT EAR  . 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 Last eruption 3/12  . History of peptic ulcer   . Infertility, female   . Lactose intolerance   . Otosclerosis of left ear   . Pancreatic disease   . PCO (polycystic ovaries)   . PCOS (polycystic ovarian syndrome) 03/11/2017  . PONV (postoperative nausea and vomiting)   . 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 Vertigo   . Vitamin B12 deficiency 07/29/2018   Started IM injections monthly 06/2018  . Vitamin D deficiency   . Wears hearing aid    both ears    Family History  Problem Relation Age of Onset  . Heart attack Mother   . Heart disease Mother   . Diabetes Mother   . Hypertension Mother   . Hyperlipidemia Mother   . Stroke Mother   . Kidney disease Mother   . Thyroid disease Mother   . Anxiety disorder Mother   . Obesity Mother   . Cancer Father        unknown primary  . Diabetes Father   . Hypertension Father   . Hyperlipidemia Father   . Heart disease Father   . Stroke Father   . Kidney disease Father   . Obesity Father   . Stroke Sister   . Diabetes Sister   . Diabetes Maternal Aunt   . Heart disease Maternal Aunt   . Cancer Paternal Aunt   . Cancer Paternal Uncle   . Colon cancer Neg Hx   . Colon polyps Neg Hx     Past Surgical History:  Procedure Laterality Date  . BLEPHAROPLASTY  03-05-2000  . CRANIOTOMY  08/2014   to repair dehescence in right ear  . HAMMER TOE SURGERY Left 08-29-2013   2nd toe  . IMPLANTATION BONE ANCHORED HEARING AID Left  07/27/2008   left temporal bone;now removed  . LAPAROSCOPIC CHOLECYSTECTOMY  03-31-2005  . LAPAROSCOPIC GASTRIC BANDING  02-08-2007  . LAPAROSCOPY N/A 11/17/2017   Procedure: LAPAROSCOPY LYSIS OF ADHESIONS FOR SMALL BOWEL OBSTRUCTION;  Surgeon: Excell Seltzer, MD;  Location: [facility];  Service: General;  Laterality: N/A;  . LASER ABLATION CONDOLAMATA N/A 07/30/2012   Procedure: LASER ABLATION CONDOLAMATA;  Surgeon: Leighton Ruff, MD;  Location: [facility];  Service: General;  Laterality: N/A;  . LASIK Bilateral 2002  . LYSIS OF ADHESION N/A 11/17/2017   Procedure: LYSIS OF ADHESION;  Surgeon: Excell Seltzer, MD;  Location: [facility];  Service: General;  Laterality: N/A;  . ROUX-EN-Y GASTRIC BYPASS  AUG 2013  . [place];  1994 x2;  02-03-1998  . STRABISMUS SURGERY Right   . VAGINAL HYSTERECTOMY  2007   fibroids   Social History   Occupational History  . Occupation: Sports coach: [employer]    Comment: [employer]  Tobacco Use  . Smoking status: Never Smoker  . Smokeless tobacco: Never Used  Vaping Use  . Vaping Use: Never used  Substance and Sexual Activity  . Alcohol use: No    Alcohol/week: 0.0 standard drinks  . Drug use: No  . Sexual activity: Yes    Birth control/protection: Surgical

## 2020-02-23 NOTE — Patient Instructions (Signed)
Pt is reminded to avoid retinol products & sun exposure. She will use moisturizer & sunscreen. No harsh scrubs for approx 2 weeks This is the 3rd FRAX laser treatment & pt is reminded that we will re-evaluate her skin after 2 months to assess if further sessions are necessary.  She will continue her skin care regimen with products from our office & she will call for any concerns.

## 2020-02-23 NOTE — Progress Notes (Signed)
Preoperative Dx: facial aging  Postoperative Dx:  same  Procedure: laser to face  Anesthesia: EMLA -pt applied 30 mins prior to procedure  Description of Procedure:  Risks and complications were explained to the patient. Consent was confirmed and signed. Time out was called and all information was confirmed to be correct. The area  was prepped with alcohol and wiped dry.  The FRAX laser was set at the following:  skin -1/ suntan none -  40.0/25.0 & 3 passes Decreased to 35.0 around mouth/forehead & eye area The entire face was lasered The patient tolerated the procedure well and there were no complications.  Aloe & after laser balm applied. She is reminded to protect skin with sunscreen & moisturizer & avoid retina products & no abrasive scrubs for approx. 7 to 10 days  She is reminded that she may experience redness/peeling & irritation Patient to f/u in 6 weeks Continue skin care regimen.  She will call for any concerns

## 2020-02-27 ENCOUNTER — Encounter: Payer: Self-pay | Admitting: Family Medicine

## 2020-02-27 ENCOUNTER — Other Ambulatory Visit: Payer: Self-pay | Admitting: Family Medicine

## 2020-02-29 ENCOUNTER — Telehealth: Payer: Self-pay

## 2020-02-29 MED ORDER — BUSPIRONE HCL 7.5 MG PO TABS
7.5000 mg | ORAL_TABLET | Freq: Two times a day (BID) | ORAL | 5 refills | Status: DC
Start: 2020-02-29 — End: 2020-03-01

## 2020-03-01 ENCOUNTER — Other Ambulatory Visit (HOSPITAL_COMMUNITY): Payer: Self-pay | Admitting: Endodontics

## 2020-03-01 ENCOUNTER — Other Ambulatory Visit: Payer: Self-pay

## 2020-03-01 MED ORDER — BUSPIRONE HCL 7.5 MG PO TABS
7.5000 mg | ORAL_TABLET | Freq: Two times a day (BID) | ORAL | 5 refills | Status: DC
Start: 2020-03-01 — End: 2020-07-17

## 2020-03-01 NOTE — Telephone Encounter (Signed)
I have resent medication into pharmacy

## 2020-03-01 NOTE — Telephone Encounter (Signed)
Denise [facility] [address] [place] (Ph: [phone]) Called and stated they did not receive any response and would like [place] to resend it  If fax number is needed [phone]

## 2020-03-01 NOTE — Telephone Encounter (Signed)
Pt called stating [facility] has not received the prescription. Please advise.

## 2020-03-05 MED FILL — CYANOCOBALAMIN 1,000 MCG/ML: 1000 | 30 days supply | Qty: 1 | Fill #0

## 2020-03-19 ENCOUNTER — Other Ambulatory Visit: Payer: Self-pay | Admitting: Plastic Surgery

## 2020-03-19 ENCOUNTER — Ambulatory Visit: Payer: Medicare HMO | Admitting: Psychology

## 2020-03-20 NOTE — Telephone Encounter (Signed)
Prescription denied.  Prescriber not at this practice.//AB/CMA

## 2020-03-26 ENCOUNTER — Other Ambulatory Visit: Payer: Self-pay | Admitting: Family Medicine

## 2020-03-26 ENCOUNTER — Other Ambulatory Visit (HOSPITAL_COMMUNITY): Payer: Self-pay | Admitting: Family Medicine

## 2020-03-26 NOTE — Telephone Encounter (Signed)
Pt states [facility] told her the prescription is expired. Pt states she needs a new presciption sent to [facility] due to not having anymore refills. Please advise.

## 2020-03-26 NOTE — Telephone Encounter (Signed)
[facility] is saying there are no refills, script expired

## 2020-03-26 NOTE — Telephone Encounter (Signed)
Has refills on it. Request from pharmacy

## 2020-03-26 NOTE — Telephone Encounter (Signed)
Last refill: 02/01/2020 #30, 5 Last OV: 02/01/19 dx. Adjustment insomnia

## 2020-04-04 ENCOUNTER — Encounter (INDEPENDENT_AMBULATORY_CARE_PROVIDER_SITE_OTHER): Payer: 59 | Admitting: Ophthalmology

## 2020-04-09 MED FILL — busPIRone HCL 7.5 MG TABS: 7.5 | 45 days supply | Qty: 90 | Fill #0

## 2020-04-17 ENCOUNTER — Encounter: Payer: Medicare HMO | Admitting: Family Medicine

## 2020-04-18 ENCOUNTER — Ambulatory Visit (INDEPENDENT_AMBULATORY_CARE_PROVIDER_SITE_OTHER): Payer: Self-pay

## 2020-04-18 ENCOUNTER — Other Ambulatory Visit: Payer: Self-pay

## 2020-04-18 VITALS — BP 125/76 | HR 71 | Temp 97.6°F | Ht 61.0 in | Wt 133.0 lb

## 2020-04-18 DIAGNOSIS — Z719 Counseling, unspecified: Secondary | ICD-10-CM

## 2020-04-20 NOTE — Patient Instructions (Signed)
Pt is reminded to use sunscreen and avoid sun. Moisturize skin Avoid retina/retinol products and no harsh scrubs Call for any concerns

## 2020-04-20 NOTE — Progress Notes (Signed)
Preoperative Dx: facial aging  Postoperative Dx:  same  Procedure: laser to face/ touch-up eye & mouth areas  Anesthesia: EMLA -pt applied 30 mins prior to procedure  Description of Procedure:  Risks and complications were explained to the patient. Consent was confirmed and signed. Time out was called and all information was confirmed to be correct. The area  was prepped with alcohol and wiped dry.  The FRAX laser was set at the following:  skin -1/ suntan - none = 40.0/25.0 & 3 passes- lasered around mouth then  Decreased to 35.0 around eye area  The patient tolerated the procedure well and there were no complications.  Aloe applied   She will call for any concerns

## 2020-04-23 ENCOUNTER — Other Ambulatory Visit (HOSPITAL_BASED_OUTPATIENT_CLINIC_OR_DEPARTMENT_OTHER): Payer: Self-pay

## 2020-04-23 MED FILL — Diazepam Tab 10 MG: ORAL | 30 days supply | Qty: 30 | Fill #0 | Status: CN

## 2020-04-23 MED FILL — Cyanocobalamin Inj 1000 MCG/ML: INTRAMUSCULAR | 90 days supply | Qty: 3 | Fill #0 | Status: AC

## 2020-04-25 ENCOUNTER — Other Ambulatory Visit (HOSPITAL_BASED_OUTPATIENT_CLINIC_OR_DEPARTMENT_OTHER): Payer: Self-pay

## 2020-04-25 ENCOUNTER — Other Ambulatory Visit: Payer: Self-pay

## 2020-04-25 ENCOUNTER — Ambulatory Visit: Payer: Medicare HMO | Admitting: Psychology

## 2020-04-25 MED FILL — Diazepam Tab 10 MG: ORAL | 30 days supply | Qty: 30 | Fill #0 | Status: AC

## 2020-04-26 ENCOUNTER — Other Ambulatory Visit: Payer: Self-pay

## 2020-04-26 ENCOUNTER — Ambulatory Visit (INDEPENDENT_AMBULATORY_CARE_PROVIDER_SITE_OTHER): Payer: Medicare HMO | Admitting: Otolaryngology

## 2020-04-26 ENCOUNTER — Other Ambulatory Visit (HOSPITAL_BASED_OUTPATIENT_CLINIC_OR_DEPARTMENT_OTHER): Payer: Self-pay

## 2020-04-26 VITALS — Temp 96.1°F

## 2020-04-26 DIAGNOSIS — H90A21 Sensorineural hearing loss, unilateral, right ear, with restricted hearing on the contralateral side: Secondary | ICD-10-CM | POA: Diagnosis not present

## 2020-04-26 NOTE — Progress Notes (Signed)
HPI: Denise Rice is a 70 y.o. female who presents for evaluation of possible Derm stuck in the right ear where she wears hearing aid.  She has no hearing in the left ear and has hearing loss in the right ear where she [facility].  Her last audiogram was a little over a year ago.  Recently she felt like her hearing was distorted and thought she might have a dome from the hearing aid stuck in the right ear canal and presents here.  Past Medical History:  Diagnosis Date  . Anal condyloma   . Anxiety   . Constipation   . Deafness in left ear   . Depression   . Fatigue   . Gallbladder problem   . Hearing loss, sensorineural, high frequency    RIGHT EAR  . 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 Last eruption 3/12  . History of peptic ulcer   . Infertility, female   . Lactose intolerance   . Otosclerosis of left ear   . Pancreatic disease   . PCO (polycystic ovaries)   . PCOS (polycystic ovarian syndrome) 03/11/2017  . PONV (postoperative nausea and vomiting)   . 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 Vertigo   . Vitamin B12 deficiency 07/29/2018   Started IM injections monthly 06/2018  . Vitamin D deficiency   . Wears hearing aid    both ears   Past Surgical History:  Procedure Laterality Date  . BLEPHAROPLASTY  03-05-2000  . CRANIOTOMY  08/2014   to repair dehescence in right ear  . HAMMER TOE SURGERY Left 08-29-2013   2nd toe  . IMPLANTATION BONE ANCHORED HEARING AID Left 07/27/2008   left temporal bone;now removed  . LAPAROSCOPIC CHOLECYSTECTOMY  03-31-2005  . LAPAROSCOPIC GASTRIC BANDING  02-08-2007  . LAPAROSCOPY N/A 11/17/2017   Procedure: LAPAROSCOPY LYSIS OF ADHESIONS FOR SMALL BOWEL OBSTRUCTION;  Surgeon: Excell Seltzer, MD;  Location: [facility];  Service: General;  Laterality: N/A;  . LASER ABLATION CONDOLAMATA N/A 07/30/2012   Procedure: LASER ABLATION CONDOLAMATA;  Surgeon:  Leighton Ruff, MD;  Location: [facility];  Service: General;  Laterality: N/A;  . LASIK Bilateral 2002  . LYSIS OF ADHESION N/A 11/17/2017   Procedure: LYSIS OF ADHESION;  Surgeon: Excell Seltzer, MD;  Location: [facility];  Service: General;  Laterality: N/A;  . ROUX-EN-Y GASTRIC BYPASS  AUG 2013  . [place];  1994 x2;  02-03-1998  . STRABISMUS SURGERY Right   . VAGINAL HYSTERECTOMY  2007   fibroids   Social History   Socioeconomic History  . Marital status: Married    Spouse name: Vicenta Dunning   . Number of children: 1  . Years of education: MBA  . Highest education level: Not on file  Occupational History  . 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 Smoking status: Never Smoker  . Smokeless tobacco: Never Used  Vaping Use  . Vaping Use: Never used  Substance and Sexual Activity  . Alcohol use: No    Alcohol/week: 0.0 standard drinks  . Drug use: No  . Sexual activity: Yes    Birth control/protection: Surgical  Other Topics Concern  . Not on file  Social History Narrative   Lives at home with husband and daughter.   Left handed.   Caffeine  use: 2-3 cups (coffee) per day    Social Determinants of Health   Financial Resource Strain: Not on file  Food Insecurity: Not on file  Transportation Needs: Not on file  Physical Activity: Not on file  Stress: Not on file  Social Connections: Not on file   Family History  Problem Relation Age of Onset  . Heart attack Mother   . Heart disease Mother   . Diabetes Mother   . Hypertension Mother   . Hyperlipidemia Mother   . Stroke Mother   . Kidney disease Mother   . Thyroid disease Mother   . Anxiety disorder Mother   . Obesity Mother   . Cancer Father        unknown primary  . Diabetes Father   . Hypertension Father   . Hyperlipidemia Father   . Heart disease Father   . Stroke Father   . Kidney disease Father   . Obesity  Father   . Stroke Sister   . Diabetes Sister   . Diabetes Maternal Aunt   . Heart disease Maternal Aunt   . Cancer Paternal Aunt   . Cancer Paternal Uncle   . Colon cancer Neg Hx   . Colon polyps Neg Hx    Allergies  Allergen Reactions  . Topiramate Nausea Only   Prior to Admission medications   Medication Sig Start Date End Date Taking? Authorizing Provider  amoxicillin (AMOXIL) 500 MG capsule TAKE 2 CAPSULES BY MOUTH NOW THEN TAKE 1 CAPSULE 3 TIMES A DAY UNTIL GONE 03/01/20 03/01/21  Heloise Purpura, DDS  azelastine (ASTELIN) 0.1 % nasal spray Place 2 sprays into both nostrils 2 (two) times daily. 01/03/20   Vivi Barrack, MD  baclofen (LIORESAL) 10 MG tablet Take 1/2-1 tablet (5-10 mg total) by mouth 3 (three) times daily as needed for muscle spasms. 11/21/19   Hilts, Michael, MD  Biotin 5000 MCG CAPS Take 5,000 mcg by mouth daily.     [provider]  busPIRone (BUSPAR) 7.5 MG tablet Take 1 tablet (7.5 mg total) by mouth 2 (two) times daily. for anxiety 03/01/20   [name], MD  busPIRone (BUSPAR) 7.5 MG tablet TAKE 1 TABLET BY MOUTH TWICE DAILY AS NEEDED FOR ANXIETY 03/01/20 03/01/21  [name], MD  cholecalciferol (VITAMIN D3) 25 MCG (1000 UT) tablet Take 1,000 Units by mouth daily.    [provider]  cyanocobalamin (,VITAMIN B-12,) 1000 MCG/ML injection INJECT 1 ML INTO THE MUSCLE EVERY 30 DAYS 07/08/19   [name], MD  cyanocobalamin (,VITAMIN B-12,) 1000 MCG/ML injection INJECT 1 ML INTO THE MUSCLE EVERY 30 DAYS 07/08/19 07/24/20  [name], MD  cyanocobalamin 2000 MCG tablet Take 2,000 mcg by mouth daily.    [provider]  diazepam (VALIUM) 10 MG tablet TAKE 1 TABLET BY MOUTH ONCE DAILY 03/26/20   [name], MD  diazepam (VALIUM) 10 MG tablet TAKE 1 TABLET BY MOUTH ONCE DAILY 03/26/20 09/22/20  [name], MD  escitalopram (LEXAPRO) 20 MG tablet TAKE 1 TABLET BY MOUTH ONCE A DAY 09/21/19   [name], MD  ferrous sulfate  325 (65 FE) MG tablet Take 325 mg by mouth daily with breakfast.    [provider]  Multiple Vitamin (MULTIVITAMIN WITH MINERALS) TABS tablet Take 1 tablet by mouth daily.    [provider]  omeprazole (PRILOSEC) 20 MG capsule Take 1 capsule (20 mg total) by mouth daily. 12/29/19   Jonni Sanger,  Karie Fetch, MD  ondansetron (ZOFRAN ODT) 4 MG disintegrating tablet Take 1 tablet (4 mg total) by mouth every 8 (eight) hours as needed for nausea or vomiting. 01/03/20   Vivi Barrack, MD  rizatriptan (MAXALT-MLT) 10 MG disintegrating tablet Take 1 tablet (10 mg total) by mouth as needed for migraine. May repeat in 2 hours if needed 11/24/19   Lomax, Amy, NP  Syringe/Needle, Disp, (SYRINGE 3CC/25GX1") 25G X 1" 3 ML MISC Use once monthly for injection of Viatmin B-12 07/04/19   [name], MD  traZODone (DESYREL) 100 MG tablet Take 1-1.5 tablets (100-150 mg total) by mouth at bedtime. 12/29/19   [name], MD  traZODone (DESYREL) 100 MG tablet TAKE 1 TO 1&1/2 TABLETS BY MOUTH AT BEDTIME 12/29/19 12/28/20  [name], MD     Positive ROS: Otherwise negative  All other systems have been reviewed and were otherwise negative with the exception of those mentioned in the HPI and as above.  Physical Exam: Constitutional: Alert, well-appearing, no acute distress Ears: External ears without lesions or tenderness.  On microscopic exam the right ear canal is clear.  The TM is clear with good mobility on pneumatic otoscopy with no wax was done in the ear canal and no middle ear space abnormality noted.  Left ear canal and left TM are likewise clear.  Hearing screening with the 512 tuning fork revealed AC > BC on the right side. Nasal: External nose without lesions.. Clear nasal passages Oral: Lips and gums without lesions. Tongue and palate mucosa without lesions. Posterior oropharynx clear. Neck: No palpable adenopathy or masses Respiratory: Breathing comfortably  Skin: No facial/neck  lesions or rash noted.  Procedures  Assessment: Right ear sensorineural hearing loss with use of hearing aid.  Normal ear canal exam otherwise.  Deaf in the left ear.  Plan: Recommended follow-up with our audiologist concerning further hearing test if the hearing does not improve.  Radene Journey, MD

## 2020-04-27 DIAGNOSIS — M25562 Pain in left knee: Secondary | ICD-10-CM | POA: Diagnosis not present

## 2020-05-14 DIAGNOSIS — M25562 Pain in left knee: Secondary | ICD-10-CM | POA: Diagnosis not present

## 2020-05-16 ENCOUNTER — Telehealth: Payer: Self-pay

## 2020-05-16 NOTE — Telephone Encounter (Signed)
Results faxed via Epic to [facility] per Mrs. Iona Coach request. Confirmation received.

## 2020-05-16 NOTE — Telephone Encounter (Signed)
Please send Mrs. Baldridge last PAP Smear, Colonoscopy and Mammography results to [facility] at fax number [phone]. She has requested this for her donor eligibility.

## 2020-05-23 ENCOUNTER — Other Ambulatory Visit (HOSPITAL_BASED_OUTPATIENT_CLINIC_OR_DEPARTMENT_OTHER): Payer: Self-pay

## 2020-05-23 MED FILL — Buspirone HCl Tab 7.5 MG: ORAL | 45 days supply | Qty: 90 | Fill #0 | Status: AC

## 2020-05-23 MED FILL — Diazepam Tab 10 MG: ORAL | 30 days supply | Qty: 30 | Fill #1 | Status: AC

## 2020-05-23 MED FILL — Cyanocobalamin Inj 1000 MCG/ML: INTRAMUSCULAR | 60 days supply | Qty: 2 | Fill #1 | Status: CN

## 2020-05-27 DIAGNOSIS — N200 Calculus of kidney: Secondary | ICD-10-CM | POA: Diagnosis not present

## 2020-05-31 ENCOUNTER — Ambulatory Visit (INDEPENDENT_AMBULATORY_CARE_PROVIDER_SITE_OTHER): Payer: Medicare HMO | Admitting: Psychology

## 2020-05-31 DIAGNOSIS — F4323 Adjustment disorder with mixed anxiety and depressed mood: Secondary | ICD-10-CM

## 2020-06-04 DIAGNOSIS — R5383 Other fatigue: Secondary | ICD-10-CM | POA: Diagnosis not present

## 2020-06-04 DIAGNOSIS — Z20822 Contact with and (suspected) exposure to covid-19: Secondary | ICD-10-CM | POA: Diagnosis not present

## 2020-06-04 DIAGNOSIS — R519 Headache, unspecified: Secondary | ICD-10-CM | POA: Diagnosis not present

## 2020-06-06 ENCOUNTER — Other Ambulatory Visit (HOSPITAL_BASED_OUTPATIENT_CLINIC_OR_DEPARTMENT_OTHER): Payer: Self-pay

## 2020-06-06 DIAGNOSIS — Z20822 Contact with and (suspected) exposure to covid-19: Secondary | ICD-10-CM | POA: Diagnosis not present

## 2020-06-06 DIAGNOSIS — R5383 Other fatigue: Secondary | ICD-10-CM | POA: Diagnosis not present

## 2020-06-06 DIAGNOSIS — R0981 Nasal congestion: Secondary | ICD-10-CM | POA: Diagnosis not present

## 2020-06-13 ENCOUNTER — Encounter: Payer: Self-pay | Admitting: Plastic Surgery

## 2020-06-22 ENCOUNTER — Other Ambulatory Visit (HOSPITAL_BASED_OUTPATIENT_CLINIC_OR_DEPARTMENT_OTHER): Payer: Self-pay

## 2020-06-22 MED FILL — Diazepam Tab 10 MG: ORAL | 30 days supply | Qty: 30 | Fill #2 | Status: AC

## 2020-06-22 MED FILL — Trazodone HCl Tab 100 MG: ORAL | 90 days supply | Qty: 135 | Fill #0 | Status: AC

## 2020-06-25 ENCOUNTER — Encounter: Payer: Medicare HMO | Admitting: Family Medicine

## 2020-06-29 DIAGNOSIS — M25562 Pain in left knee: Secondary | ICD-10-CM | POA: Diagnosis not present

## 2020-07-03 ENCOUNTER — Encounter: Payer: Self-pay | Admitting: Family Medicine

## 2020-07-11 ENCOUNTER — Telehealth: Payer: Self-pay

## 2020-07-11 NOTE — Telephone Encounter (Signed)
Patient is scheduled   

## 2020-07-11 NOTE — Telephone Encounter (Signed)
Please advise 

## 2020-07-11 NOTE — Telephone Encounter (Signed)
Patient is calling in stating Dr.Andy wanted her to call the office to schedule an appointment to follow up from lab results, didn't see documentation. Can we work patient in a same day?

## 2020-07-13 ENCOUNTER — Ambulatory Visit: Payer: Medicare HMO | Admitting: Family Medicine

## 2020-07-14 ENCOUNTER — Encounter: Payer: Self-pay | Admitting: Plastic Surgery

## 2020-07-17 ENCOUNTER — Ambulatory Visit (INDEPENDENT_AMBULATORY_CARE_PROVIDER_SITE_OTHER): Payer: Medicare HMO | Admitting: Family Medicine

## 2020-07-17 ENCOUNTER — Ambulatory Visit (INDEPENDENT_AMBULATORY_CARE_PROVIDER_SITE_OTHER): Payer: Medicare HMO

## 2020-07-17 ENCOUNTER — Other Ambulatory Visit (HOSPITAL_BASED_OUTPATIENT_CLINIC_OR_DEPARTMENT_OTHER): Payer: Self-pay

## 2020-07-17 ENCOUNTER — Other Ambulatory Visit: Payer: Self-pay

## 2020-07-17 DIAGNOSIS — M65321 Trigger finger, right index finger: Secondary | ICD-10-CM

## 2020-07-17 DIAGNOSIS — M65312 Trigger thumb, left thumb: Secondary | ICD-10-CM | POA: Diagnosis not present

## 2020-07-17 DIAGNOSIS — M25572 Pain in left ankle and joints of left foot: Secondary | ICD-10-CM

## 2020-07-17 DIAGNOSIS — M65322 Trigger finger, left index finger: Secondary | ICD-10-CM

## 2020-07-17 DIAGNOSIS — M6530 Trigger finger, unspecified finger: Secondary | ICD-10-CM

## 2020-07-17 DIAGNOSIS — M65311 Trigger thumb, right thumb: Secondary | ICD-10-CM | POA: Diagnosis not present

## 2020-07-17 MED FILL — Diazepam Tab 10 MG: ORAL | 30 days supply | Qty: 30 | Fill #3 | Status: AC

## 2020-07-17 MED FILL — Buspirone HCl Tab 7.5 MG: ORAL | 45 days supply | Qty: 90 | Fill #1 | Status: AC

## 2020-07-17 NOTE — Progress Notes (Signed)
Office Visit Note   Patient: Denise Rice           Date of Birth: Nov 18, 1950           MRN: [id number] Visit Date: 07/17/2020 Requested by: [name], [place],  [place] [address] PCP: [name], MD  Subjective: Chief Complaint  Patient presents with   Left Foot - Pain    Pain on the top of the foot x 2 months. She noticed it with doing modified burpees at the gym and occasionally at home. Hurts to bear weight on the left when this flares up ---- "feels like my bones are cracking." Swelling in the left ankle, especially after standing 5 hours/day at her job. Has not gotten compression stockings yet.    Right Hand - Pain    Trigger fingers both hands: thumbs, index fingers and little fingers --- worse in the thumbs.  Left-hand dominant.    Left Hand - Pain    HPI: She is here with bilateral hand and left foot pain.  She has had ongoing triggering of the thumb, index, and fifth finger of both hands.  The thumb and index fingers bother her the most.  She has been to physical therapy and is doing the exercises prescribed but the symptoms are getting worse.  Her left foot has bothered her for the past couple months, no injury.  It hurts when standing for long periods of time and it makes a cracking sound occasionally.  Pain is mostly along the fourth metatarsal.               ROS:   All other systems were reviewed and are negative.  Objective: Vital Signs: LMP 01/13/2005   Physical Exam:  General:  Alert and oriented, in no acute distress. Pulm:  Breathing unlabored. Psy:  Normal mood, congruent affect.  Hands: She has tender nodules at the A1 pulleys of the thumb, index and fifth finger of both hands.  There is active triggering of the thumbs. Left foot: She has ongoing edema of the left leg.  There is tenderness to palpation of the fourth and somewhat of the third metatarsal shaft.  No crepitation or step-off.   Imaging: XR Foot Complete  Left  Result Date: 07/17/2020 X-Rays left foot reveal a possible cortical elevation at the mid-shaft 3rd metatarsal which could indicate stress fracture.  No other acute abnormality seen.     Assessment & Plan: Bilateral thumb, index and fifth trigger fingers -Discussed with patient and she wants the thumbs and index fingers injected with cortisone today.  Follow-up as needed.  2.  Left foot pain, concerning for healing third metatarsal shaft stress fracture -She will be cautious with activities for the next 4 to 8 weeks.  If symptoms persist, repeat x-rays.  If x-rays are unrevealing, then MRI scan.     Procedures: Bilateral hand injections: After sterile prep with Betadine, injected 1 cc 1% lidocaine without epinephrine and 3 mg betamethasone into each thumb and index finger A1 pulley.       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    Vertigo    Vitamin B12 deficiency 07/29/2018   Started IM injections monthly 06/2018   Vitamin D deficiency    Wears hearing aid    both ears    Family History  Problem Relation Age of Onset   Heart attack Mother    Heart disease Mother    Diabetes Mother    Hypertension Mother    Hyperlipidemia Mother    Stroke Mother    Kidney disease Mother    Thyroid disease Mother    Anxiety disorder Mother    Obesity Mother    Cancer Father        unknown primary   Diabetes Father    Hypertension Father    Hyperlipidemia Father    Heart disease Father    Stroke Father    Kidney disease Father    Obesity Father    Stroke Sister    Diabetes Sister    Diabetes Maternal Aunt    Heart disease Maternal Aunt    Cancer Paternal Aunt    Cancer Paternal Uncle    Colon cancer Neg Hx    Colon polyps Neg Hx     Past Surgical History:  Procedure Laterality Date   BLEPHAROPLASTY  03-05-2000   CRANIOTOMY  08/2014   to repair dehescence in right ear   HAMMER TOE SURGERY Left 08-29-2013   2nd toe   IMPLANTATION BONE ANCHORED HEARING AID Left 07/27/2008   left temporal bone;now removed   LAPAROSCOPIC CHOLECYSTECTOMY  03-31-2005   LAPAROSCOPIC GASTRIC BANDING  02-08-2007   LAPAROSCOPY N/A 11/17/2017   Procedure: LAPAROSCOPY LYSIS OF ADHESIONS FOR SMALL BOWEL OBSTRUCTION;  Surgeon: Excell Seltzer, MD;  Location: [facility];  Service: General;  Laterality: N/A;   LASER ABLATION CONDOLAMATA N/A 07/30/2012   Procedure: LASER ABLATION CONDOLAMATA;  Surgeon: Leighton Ruff, MD;  Location: [facility];  Service: General;  Laterality: N/A;   LASIK Bilateral 2002   LYSIS OF ADHESION N/A 11/17/2017   Procedure: LYSIS OF  ADHESION;  Surgeon: Excell Seltzer, MD;  Location: [facility];  Service: General;  Laterality: N/A;   ROUX-EN-Y GASTRIC BYPASS  AUG 2013   [place] Left 1985;  1994 x2;  02-03-1998   STRABISMUS SURGERY Right    VAGINAL HYSTERECTOMY  2007   fibroids   Social History   Occupational History   Occupation: Sports coach: [employer]    Comment:  [employer]  Tobacco Use   Smoking status: Never   Smokeless tobacco: Never  Vaping Use   Vaping Use: Never used  Substance and Sexual Activity   Alcohol use: No    Alcohol/week: 0.0 standard drinks   Drug use: No   Sexual activity: Yes    Birth control/protection: Surgical

## 2020-07-18 ENCOUNTER — Other Ambulatory Visit (HOSPITAL_BASED_OUTPATIENT_CLINIC_OR_DEPARTMENT_OTHER): Payer: Self-pay

## 2020-07-26 ENCOUNTER — Other Ambulatory Visit (HOSPITAL_COMMUNITY): Payer: Self-pay

## 2020-07-26 MED FILL — Diazepam Tab 10 MG: ORAL | 30 days supply | Qty: 30 | Fill #4 | Status: CN

## 2020-07-26 MED FILL — Buspirone HCl Tab 7.5 MG: ORAL | 45 days supply | Qty: 90 | Fill #2 | Status: CN

## 2020-07-28 ENCOUNTER — Other Ambulatory Visit: Payer: Self-pay | Admitting: Family Medicine

## 2020-07-30 ENCOUNTER — Other Ambulatory Visit (HOSPITAL_COMMUNITY): Payer: Self-pay

## 2020-07-30 MED ORDER — "SYRINGE 25G X 1"" 3 ML MISC"
0 refills | Status: DC
  Filled 2020-07-30: qty 3, 90d supply, fill #0

## 2020-07-31 ENCOUNTER — Other Ambulatory Visit (HOSPITAL_COMMUNITY): Payer: Self-pay

## 2020-08-02 ENCOUNTER — Other Ambulatory Visit (HOSPITAL_COMMUNITY): Payer: Self-pay

## 2020-08-02 MED FILL — Trazodone HCl Tab 100 MG: ORAL | 90 days supply | Qty: 135 | Fill #1 | Status: CN

## 2020-08-07 ENCOUNTER — Encounter: Payer: Self-pay | Admitting: Plastic Surgery

## 2020-08-07 ENCOUNTER — Ambulatory Visit (INDEPENDENT_AMBULATORY_CARE_PROVIDER_SITE_OTHER): Payer: Medicare HMO | Admitting: Plastic Surgery

## 2020-08-07 ENCOUNTER — Other Ambulatory Visit: Payer: Self-pay

## 2020-08-07 ENCOUNTER — Other Ambulatory Visit (HOSPITAL_COMMUNITY): Payer: Self-pay

## 2020-08-07 DIAGNOSIS — L819 Disorder of pigmentation, unspecified: Secondary | ICD-10-CM | POA: Insufficient documentation

## 2020-08-07 MED FILL — Trazodone HCl Tab 100 MG: ORAL | 90 days supply | Qty: 135 | Fill #1 | Status: CN

## 2020-08-07 NOTE — Progress Notes (Signed)
   Subjective:    Patient ID: Denise Rice, female    DOB: January 27, 1950, 70 y.o.   MRN: [id number]  The patient is a 70 year old female here for evaluation of 2 changing skin lesions.  She has a hyperpigmented area on her left forehead.  It is about 1-1/2 x 2-1/2 cm.  Its not really raised but it is right in her hairline.  She believes it has gotten larger over the past year.  There is also a hyperpigmented area that is about 5 mm in size on her left upper lid.  It is slightly raised it looks a little more like a seborrheic keratosis.  It is as well getting a little larger and a little darker.  She is otherwise doing very well and looks great today.  No other concerning lesions.     Review of Systems  Constitutional: Negative.   Eyes: Negative.   Respiratory: Negative.    Cardiovascular: Negative.   Gastrointestinal: Negative.   Endocrine: Negative.   Genitourinary: Negative.   Skin:  Positive for color change.      Objective:   Physical Exam HENT:     Head: Normocephalic and atraumatic.   Cardiovascular:     Rate and Rhythm: Normal rate.     Pulses: Normal pulses.  Pulmonary:     Effort: Pulmonary effort is normal.  Skin:    Coloration: Skin is not jaundiced.     Findings: Lesion present. No bruising.  Neurological:     Mental Status: Mental status is at baseline.  Psychiatric:        Mood and Affect: Mood normal.        Behavior: Behavior normal.        Thought Content: Thought content normal.       Assessment & Plan:     ICD-10-CM   1. Changing pigmented skin lesion  L81.9       Recommend biopsy of forehead changing skin lesion.  If it is concerning then will go ahead with the left upper eyelid as well.  Pictures were obtained of the patient and placed in the chart with the patient's or guardian's permission.

## 2020-08-08 ENCOUNTER — Other Ambulatory Visit (HOSPITAL_COMMUNITY): Payer: Self-pay

## 2020-08-09 ENCOUNTER — Other Ambulatory Visit (HOSPITAL_COMMUNITY): Payer: Self-pay

## 2020-08-10 ENCOUNTER — Other Ambulatory Visit (HOSPITAL_COMMUNITY): Payer: Self-pay

## 2020-08-10 MED FILL — Trazodone HCl Tab 100 MG: ORAL | 90 days supply | Qty: 135 | Fill #1 | Status: CN

## 2020-08-12 ENCOUNTER — Encounter: Payer: Self-pay | Admitting: Family Medicine

## 2020-08-13 ENCOUNTER — Telehealth (INDEPENDENT_AMBULATORY_CARE_PROVIDER_SITE_OTHER): Payer: Medicare HMO | Admitting: Family Medicine

## 2020-08-13 ENCOUNTER — Other Ambulatory Visit: Payer: Self-pay

## 2020-08-13 ENCOUNTER — Encounter: Payer: Self-pay | Admitting: Family Medicine

## 2020-08-13 DIAGNOSIS — E559 Vitamin D deficiency, unspecified: Secondary | ICD-10-CM

## 2020-08-13 DIAGNOSIS — Z9884 Bariatric surgery status: Secondary | ICD-10-CM

## 2020-08-13 DIAGNOSIS — J069 Acute upper respiratory infection, unspecified: Secondary | ICD-10-CM

## 2020-08-13 DIAGNOSIS — R7303 Prediabetes: Secondary | ICD-10-CM

## 2020-08-13 DIAGNOSIS — R5383 Other fatigue: Secondary | ICD-10-CM

## 2020-08-13 DIAGNOSIS — D649 Anemia, unspecified: Secondary | ICD-10-CM | POA: Diagnosis not present

## 2020-08-13 DIAGNOSIS — F419 Anxiety disorder, unspecified: Secondary | ICD-10-CM | POA: Diagnosis not present

## 2020-08-13 DIAGNOSIS — E538 Deficiency of other specified B group vitamins: Secondary | ICD-10-CM | POA: Diagnosis not present

## 2020-08-13 NOTE — Telephone Encounter (Signed)
Pt wants to change visit today to virtual due to sick.

## 2020-08-13 NOTE — Telephone Encounter (Signed)
Patient appt has been changed to virtual.

## 2020-08-13 NOTE — Progress Notes (Signed)
Virtual Visit via Video Note  Subjective  CC:  Chief Complaint  Patient presents with   Anemia    Wanting to discuss multiple new diagnoses found by [facility] when she went to pre kidney donation transplant evaluation, would not go into further detail with me      I connected with Denise Rice on 08/13/20 at 11:30 AM EDT by a video enabled telemedicine application and verified that I am speaking with the correct person using two identifiers. Location patient: Home Location provider: [facility] [place], Office Persons participating in the virtual visit: DAVIDA FALCONI, [name], MD Reymundo Poll CMA  I discussed the limitations of evaluation and management by telemedicine and the availability of in person appointments. The patient expressed understanding and agreed to proceed. HPI: Denise Rice is a 70 y.o. female who was contacted today to address the problems listed above in the chief complaint. 70 year old female who was undergoing a renal donor evaluation at [facility] needs assessment from some findings.  I reviewed those records.  She has a anemia that has not been evaluated.  Looks like it started back in September 2021.  Fairly microcytic.  She is status post gastric bypass.  She eats well, exercise well takes iron daily and denies melena.  Her most recent colonoscopy was normal in 2017.  She admits to fatigue she takes vitamin B12 for vitamin B 12 deficiency.  She takes both oral and intramuscular supplements.  She takes a multivitamin as well. Renal/abdominal pelvic CT was reviewed.  These are likely benign calcified mass in the abdomen that looks consistent with a benign calcified lymph node.  She has some atherosclerotic disease.  She does have some renal cysts and hepatic cysts. Depression: She ran out of her Lexapro several months ago.  She has stopped taking it and feels that her depression is very stable.  She is no longer having depressive symptoms.  She has a  chronic history of depression.  She is undergoing stress with her husband who is on dialysis and has been ill.  She uses BuSpar for anxiety.  She uses trazodone for sleep.  She prefers to not restart Lexapro at this time. 2-day history of URI symptoms including nasal congestion, sneezing, sinus pressure without cough, fever or shortness of breath.  Assessment  1. Anemia, unspecified type   2. Fatigue, unspecified type   3. Prediabetes   4. 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 Vitamin D deficiency   6. Anxiety   7. Vitamin B12 deficiency   8. Viral URI      Plan  Anemia unspecified: Evaluation to be started: Labs placed.  Recommend stool cards.  Patient to schedule lab appointment.  Likely nutritional given history of gastric bypass surgery but need.  Ensure no other causes.  Will refer to colonoscopy if stool guaiac cards are positive.  Patient stands and agrees. CT scan findings: All consistent with benign etiologies.  No further work-up indicated at this time.  Unfortunately, she is not a donor candidate for her husband. Depression, no longer on Lexapro: Counseling done.  Will monitor for relapse.  Continue BuSpar Viral URI: Treat supportively.  She will follow-up with me if symptoms persist or symptoms of sinusitis develop for antibiotics.  I discussed the assessment and treatment plan with the patient. The patient was provided an opportunity to ask questions and all were answered. The patient agreed with the plan and demonstrated an understanding  of the instructions.   The patient was advised to call back or seek an in-person evaluation if the symptoms worsen or if the condition fails to improve as anticipated. Follow up: Return for as scheduled.  10/01/2020 for complete physical  No orders of the defined types were placed in this encounter.     I reviewed the patients updated PMH, FH, and SocHx.    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 06/25/2012    Priority: Medium   History of laparoscopic adjustable gastric banding 01/22/2011    Priority: Medium   Lactose intolerance 12/22/2018    Priority: Low   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   busPIRone (BUSPAR) 7.5 MG tablet TAKE 1 TABLET BY MOUTH TWICE DAILY AS NEEDED FOR ANXIETY   cholecalciferol (VITAMIN D3) 25 MCG (1000 UT) tablet Take 1,000 Units by mouth daily.   Cyanocobalamin (VITAMIN B-12) 1000 MCG/15ML LIQD Inject 15 mLs (1,000 mcg total) into the muscle every 30 (thirty) days.   cyanocobalamin 2000 MCG tablet Take 1,000 mcg by mouth daily.   diazepam (VALIUM) 10 MG tablet TAKE 1 TABLET BY MOUTH ONCE DAILY   ferrous sulfate 325 (65 FE) MG tablet Take 325 mg by mouth daily with breakfast.   Syringe/Needle, Disp, (SYRINGE 3CC/25GX1") 25G X 1" 3 ML MISC Use once monthly for injection of Viatmin B-12   traZODone (DESYREL) 100 MG tablet TAKE 1 TO 1 AND 1/2 TABLETS BY MOUTH AT BEDTIME    Allergies: Patient is allergic to lactose and topiramate. Family History: Patient family history includes Anxiety disorder in  her mother; Cancer in her father, paternal aunt, and paternal uncle; Diabetes in her father, maternal aunt, mother, and sister; Heart attack in her mother; Heart disease in her father, maternal aunt, and mother; Hyperlipidemia in her father and mother; Hypertension in her father and mother; Kidney disease in her father and mother; Obesity in her father and mother; Stroke in her father, mother, and sister; Thyroid disease in her mother. Social History:  Patient  reports that she has never smoked. She has never used smokeless tobacco. She reports that she does not drink alcohol and does not use drugs.  Review of Systems: Constitutional: Negative for fever malaise or anorexia Cardiovascular: negative for chest pain Respiratory: negative for SOB or persistent cough Gastrointestinal: negative for abdominal pain  OBJECTIVE Vitals: LMP 01/13/2005  General: no acute distress , A&Ox3 Nasal congestion present, no coughing.  Nontoxic-appearing  [name], MD

## 2020-08-15 ENCOUNTER — Other Ambulatory Visit (HOSPITAL_COMMUNITY): Payer: Self-pay

## 2020-08-15 ENCOUNTER — Other Ambulatory Visit: Payer: Self-pay

## 2020-08-15 ENCOUNTER — Encounter: Payer: Self-pay | Admitting: Plastic Surgery

## 2020-08-15 ENCOUNTER — Other Ambulatory Visit (HOSPITAL_COMMUNITY)
Admission: RE | Admit: 2020-08-15 | Discharge: 2020-08-15 | Disposition: A | Discharge status: Home / Self Care | Payer: Medicare HMO | Source: Ambulatory Visit | Attending: Plastic Surgery | Admitting: Plastic Surgery

## 2020-08-15 ENCOUNTER — Ambulatory Visit: Payer: Medicare HMO | Admitting: Plastic Surgery

## 2020-08-15 VITALS — BP 118/74 | HR 73

## 2020-08-15 DIAGNOSIS — L819 Disorder of pigmentation, unspecified: Secondary | ICD-10-CM

## 2020-08-15 MED FILL — Diazepam Tab 10 MG: ORAL | 30 days supply | Qty: 30 | Fill #4 | Status: AC

## 2020-08-15 MED FILL — Buspirone HCl Tab 7.5 MG: ORAL | 45 days supply | Qty: 90 | Fill #2 | Status: AC

## 2020-08-15 NOTE — Addendum Note (Signed)
Addended by: Wallace Going on: 08/15/2020 04:29 PM   Modules accepted: Orders

## 2020-08-15 NOTE — Progress Notes (Signed)
Procedure Note  Preoperative Dx: changing pigmented skin lesion of forehead  Postoperative Dx: Same  Procedure: Biopsy of changing pigmented skin lesion of forehead 3 mm  Anesthesia: Lidocaine 1% with 1:100,000 epinepherine  Indication for Procedure: skin lesion  Description of Procedure: Risks and complications were explained to the patient.  Consent was confirmed and the patient understands the risks and benefits.  The potential complications and alternatives were explained and the patient consents.  The patient expressed understanding the option of not having the procedure and the risks of a scar.  Time out was called and all information was confirmed to be correct.    The area was prepped and drapped.  Lidocaine 1% with epinepherine was injected in the subcutaneous area.  After waiting several minutes for the local to take affect a 3 mm punch biopsy was used to obtain a specimen.  The skin was closed with a 6-0 Monocryl.  The patient was given instructions on how to care for the area and a follow up appointment.  Denise Rice tolerated the procedure well and there were no complications. The specimen was sent to pathology .

## 2020-08-16 ENCOUNTER — Other Ambulatory Visit: Payer: Self-pay | Admitting: Family Medicine

## 2020-08-16 ENCOUNTER — Encounter: Payer: Self-pay | Admitting: Family Medicine

## 2020-08-17 DIAGNOSIS — L821 Other seborrheic keratosis: Secondary | ICD-10-CM | POA: Diagnosis not present

## 2020-08-17 NOTE — Telephone Encounter (Signed)
Dr. Jonni Sanger, please advise if you want pt to continue Vit B12 1000 mcg/15 ml Liquid?

## 2020-08-21 ENCOUNTER — Other Ambulatory Visit: Payer: Medicare HMO | Admitting: Plastic Surgery

## 2020-08-21 LAB — SURGICAL PATHOLOGY

## 2020-08-24 ENCOUNTER — Other Ambulatory Visit (HOSPITAL_COMMUNITY): Payer: Self-pay

## 2020-08-27 ENCOUNTER — Other Ambulatory Visit (INDEPENDENT_AMBULATORY_CARE_PROVIDER_SITE_OTHER): Payer: Medicare HMO

## 2020-08-27 ENCOUNTER — Other Ambulatory Visit: Payer: Self-pay

## 2020-08-27 DIAGNOSIS — E559 Vitamin D deficiency, unspecified: Secondary | ICD-10-CM

## 2020-08-27 DIAGNOSIS — E538 Deficiency of other specified B group vitamins: Secondary | ICD-10-CM

## 2020-08-27 DIAGNOSIS — R5383 Other fatigue: Secondary | ICD-10-CM

## 2020-08-27 DIAGNOSIS — R7303 Prediabetes: Secondary | ICD-10-CM | POA: Diagnosis not present

## 2020-08-27 DIAGNOSIS — D649 Anemia, unspecified: Secondary | ICD-10-CM | POA: Diagnosis not present

## 2020-08-27 DIAGNOSIS — Z9884 Bariatric surgery status: Secondary | ICD-10-CM | POA: Diagnosis not present

## 2020-08-27 LAB — COMPREHENSIVE METABOLIC PANEL
ALT: 15 U/L (ref 0–35)
AST: 21 U/L (ref 0–37)
Albumin: 3.8 g/dL (ref 3.5–5.2)
Alkaline Phosphatase: 81 U/L (ref 39–117)
BUN: 15 mg/dL (ref 6–23)
CO2: 29 mEq/L (ref 19–32)
Calcium: 8.5 mg/dL (ref 8.4–10.5)
Chloride: 103 mEq/L (ref 96–112)
Creatinine, Ser: 0.71 mg/dL (ref 0.40–1.20)
GFR: 87.76 mL/min (ref >60.00)
Glucose, Bld: 91 mg/dL (ref 70–99)
Potassium: 4.1 mEq/L (ref 3.5–5.1)
Sodium: 140 mEq/L (ref 135–145)
Total Bilirubin: 0.4 mg/dL (ref 0.2–1.2)
Total Protein: 6.7 g/dL (ref 6.0–8.3)

## 2020-08-27 LAB — CBC WITH DIFFERENTIAL/PLATELET
Basophils Absolute: 0 10*3/uL (ref 0.0–0.1)
Basophils Relative: 0.7 % (ref 0.0–3.0)
Eosinophils Absolute: 0.1 10*3/uL (ref 0.0–0.7)
Eosinophils Relative: 1.5 % (ref 0.0–5.0)
HCT: 32.7 % — ABNORMAL LOW (ref 36.0–46.0)
Hemoglobin: 11 g/dL — ABNORMAL LOW (ref 12.0–15.0)
Lymphocytes Relative: 30.1 % (ref 12.0–46.0)
Lymphs Abs: 1.3 10*3/uL (ref 0.7–4.0)
MCHC: 33.6 g/dL (ref 30.0–36.0)
MCV: 76.2 fl — ABNORMAL LOW (ref 78.0–100.0)
Monocytes Absolute: 0.3 10*3/uL (ref 0.1–1.0)
Monocytes Relative: 8.3 % (ref 3.0–12.0)
Neutro Abs: 2.5 10*3/uL (ref 1.4–7.7)
Neutrophils Relative %: 59.4 % (ref 43.0–77.0)
Platelets: 166 10*3/uL (ref 150.0–400.0)
RBC: 4.29 Mil/uL (ref 3.87–5.11)
RDW: 14.6 % (ref 11.5–15.5)
WBC: 4.2 10*3/uL (ref 4.0–10.5)

## 2020-08-27 LAB — VITAMIN D 25 HYDROXY (VIT D DEFICIENCY, FRACTURES): VITD: 30.1 ng/mL (ref 30.00–100.00)

## 2020-08-27 LAB — HEMOGLOBIN A1C: Hgb A1c MFr Bld: 6 % (ref 4.6–6.5)

## 2020-08-27 LAB — B12 AND FOLATE PANEL
Folate: 24.1 ng/mL (ref >5.9)
Vitamin B-12: 440 pg/mL (ref 211–911)

## 2020-08-28 ENCOUNTER — Other Ambulatory Visit (HOSPITAL_COMMUNITY): Payer: Self-pay

## 2020-08-28 LAB — IRON,TIBC AND FERRITIN PANEL
%SAT: 7 % (calc) — ABNORMAL LOW (ref 16–45)
Ferritin: 6 ng/mL — ABNORMAL LOW (ref 16–288)
Iron: 30 ug/dL — ABNORMAL LOW (ref 45–160)
TIBC: 439 mcg/dL (calc) (ref 250–450)

## 2020-08-30 ENCOUNTER — Encounter: Payer: Self-pay | Admitting: Family Medicine

## 2020-09-04 ENCOUNTER — Other Ambulatory Visit: Payer: Self-pay

## 2020-09-04 ENCOUNTER — Other Ambulatory Visit (HOSPITAL_BASED_OUTPATIENT_CLINIC_OR_DEPARTMENT_OTHER): Payer: Self-pay

## 2020-09-04 ENCOUNTER — Other Ambulatory Visit: Payer: Self-pay | Admitting: Family Medicine

## 2020-09-04 ENCOUNTER — Ambulatory Visit: Payer: Medicare HMO | Admitting: Plastic Surgery

## 2020-09-04 ENCOUNTER — Telehealth: Payer: Self-pay | Admitting: *Deleted

## 2020-09-04 ENCOUNTER — Encounter: Payer: Self-pay | Admitting: Family Medicine

## 2020-09-04 ENCOUNTER — Other Ambulatory Visit (HOSPITAL_COMMUNITY): Payer: Self-pay

## 2020-09-04 DIAGNOSIS — E611 Iron deficiency: Secondary | ICD-10-CM

## 2020-09-04 MED ORDER — CYANOCOBALAMIN 1000 MCG/ML IJ SOLN
1000.0000 ug | INTRAMUSCULAR | 1 refills | Status: DC
  Filled 2020-09-04: qty 3, 90d supply, fill #0
  Filled 2020-11-20: qty 3, 90d supply, fill #1
  Filled 2021-04-13: qty 3, 90d supply, fill #2
  Filled 2021-06-28 – 2021-07-19 (×2): qty 3, 90d supply, fill #3

## 2020-09-04 MED ORDER — "SYRINGE 25G X 1"" 3 ML MISC"
0 refills | Status: DC
  Filled 2020-09-04: qty 12, 365d supply, fill #0
  Filled 2020-11-20: qty 50, 150d supply, fill #0

## 2020-09-04 MED ORDER — VITAMIN B-12 1000 MCG/15ML PO LIQD
1000.0000 ug | ORAL | 2 refills | Status: DC
  Filled 2020-09-04: qty 240, fill #0

## 2020-09-04 NOTE — Telephone Encounter (Signed)
Per referral Dr. Jonni Sanger - called and gave upcoming appointments - confirmed - mailed welcome packet with calendar

## 2020-09-06 ENCOUNTER — Other Ambulatory Visit (HOSPITAL_COMMUNITY): Payer: Self-pay

## 2020-09-06 ENCOUNTER — Other Ambulatory Visit (HOSPITAL_BASED_OUTPATIENT_CLINIC_OR_DEPARTMENT_OTHER): Payer: Self-pay

## 2020-09-11 ENCOUNTER — Telehealth: Payer: Self-pay | Admitting: Family Medicine

## 2020-09-11 ENCOUNTER — Other Ambulatory Visit (HOSPITAL_COMMUNITY): Payer: Self-pay

## 2020-09-11 MED ORDER — AMOXICILLIN 500 MG PO CAPS
ORAL_CAPSULE | ORAL | 0 refills | Status: DC
  Filled 2020-09-11: qty 21, 7d supply, fill #0

## 2020-09-11 NOTE — Telephone Encounter (Signed)
Patient is calling in stating that she is needs an approval for the prescription, and it is a mail order which usually gets sent out by today.

## 2020-09-11 NOTE — Telephone Encounter (Signed)
Pt requesting refill for Valium. Last OV 08/13/2020.

## 2020-09-12 MED ORDER — DIAZEPAM 10 MG PO TABS: ORAL_TABLET | Freq: Every day | ORAL | 0 refills | Status: DC

## 2020-09-13 ENCOUNTER — Other Ambulatory Visit (HOSPITAL_COMMUNITY): Payer: Self-pay

## 2020-09-13 ENCOUNTER — Other Ambulatory Visit: Payer: Self-pay | Admitting: Physician Assistant

## 2020-09-13 MED ORDER — DIAZEPAM 10 MG PO TABS
ORAL_TABLET | Freq: Every day | ORAL | 0 refills | Status: DC
  Filled 2020-09-13 (×2): qty 90, 90d supply, fill #0
  Filled 2020-09-13 (×2): qty 30, 30d supply, fill #0
  Filled 2020-10-11: qty 30, 30d supply, fill #1
  Filled 2020-11-06: qty 30, 30d supply, fill #2

## 2020-09-13 NOTE — Telephone Encounter (Signed)
Patient called in stating that [facility] is unable to fill prescription because it was filled at [facility] and needed to speak to [facility]. Transferred call to [place].

## 2020-09-13 NOTE — Telephone Encounter (Signed)
Pt called back stating that she is at the [facility] long  pharmacy and she is been told that they can't fill it since it has been sent to [facility] yesterday. I called [facility] and they will cancel it but it will take 30-60 min since it was already processed. Called [facility] back and I informed her that it will be 30-60 min to cancel from [facility], she was upset and stated that it shouldn't been sent to [facility] to start with, and her preferred pharmacy should be [facility], I confirmed that it's her preferred at her chart now, and apologized for any unconvinced this caused her.  Also advised [facility] to send Mychart message or call the pharmacy and ask for refills, and the pharmacy will request the refill on her behind. She voiced understanding and agreed.

## 2020-09-13 NOTE — Telephone Encounter (Signed)
Patient called checking on refill status, I informed her that [facility] sent it yesterday to [facility], patient stated that she doesn't use [facility] anymore and wants it to be sent to [facility] instead.

## 2020-09-17 MED FILL — Trazodone HCl Tab 100 MG: ORAL | 90 days supply | Qty: 135 | Fill #1 | Status: AC

## 2020-09-18 ENCOUNTER — Other Ambulatory Visit (HOSPITAL_COMMUNITY): Payer: Self-pay

## 2020-09-18 ENCOUNTER — Other Ambulatory Visit (INDEPENDENT_AMBULATORY_CARE_PROVIDER_SITE_OTHER): Payer: Medicare HMO

## 2020-09-18 DIAGNOSIS — R5383 Other fatigue: Secondary | ICD-10-CM | POA: Diagnosis not present

## 2020-09-18 DIAGNOSIS — D649 Anemia, unspecified: Secondary | ICD-10-CM

## 2020-09-19 LAB — FECAL OCCULT BLOOD, IMMUNOCHEMICAL: Fecal Occult Bld: NEGATIVE

## 2020-09-25 ENCOUNTER — Ambulatory Visit (INDEPENDENT_AMBULATORY_CARE_PROVIDER_SITE_OTHER): Payer: Self-pay | Admitting: Plastic Surgery

## 2020-09-25 ENCOUNTER — Other Ambulatory Visit: Payer: Self-pay

## 2020-09-25 ENCOUNTER — Encounter: Payer: Self-pay | Admitting: Plastic Surgery

## 2020-09-25 DIAGNOSIS — Z719 Counseling, unspecified: Secondary | ICD-10-CM

## 2020-09-25 NOTE — Progress Notes (Signed)
Filler Injection Procedure Note  Procedure:  Filler administration  Pre-operative Diagnosis: Rytides and midface volume loss  Post-operative Diagnosis: Same  Complications:  None  Brief history: The patient desires injection with fillers in her face. I discussed with the patient this proposed procedure of filler injections, which is customized depending on the particular needs of the patient. It is performed on facial volume loss as a temporary correction. The alternatives were discussed with the patient. The risks were addressed including bleeding, scarring, infection, damage to deeper structures, asymmetry, and chronic pain, which may occur infrequently after a procedure. The individual's choice to undergo a surgical procedure is based on the comparison of risks to potential benefits. Other risks include unsatisfactory results, allergic reaction, which should go away with time, bruising and delayed healing. Fillers do not arrest the aging process or produce permanent tightening.  Operative intervention maybe necessary to maintain the results. The patient understands and wishes to proceed. An informed consent was signed and informational brochures given to her prior to the procedure.  Procedure: The area was prepped with chlorhexidine and dried with a clean gauze. Using a clean technique, the midface area was injected at the medial sub-region of the mid-face.  No complications were noted. Two syringes were used.  Light pressure was held for 5 minutes. She was instructed explicitly in post-operative care.  Restylane Lyft x 2 LOT: [id number] EXP: 2023-02-13

## 2020-10-01 ENCOUNTER — Encounter: Payer: Medicare HMO | Admitting: Family Medicine

## 2020-10-11 ENCOUNTER — Encounter: Payer: Self-pay | Admitting: Hematology & Oncology

## 2020-10-11 ENCOUNTER — Other Ambulatory Visit (HOSPITAL_COMMUNITY): Payer: Self-pay

## 2020-10-11 ENCOUNTER — Inpatient Hospital Stay: Payer: Medicare HMO | Admitting: Hematology & Oncology

## 2020-10-11 ENCOUNTER — Inpatient Hospital Stay: Payer: Medicare HMO | Attending: Hematology & Oncology

## 2020-10-11 ENCOUNTER — Other Ambulatory Visit: Payer: Self-pay

## 2020-10-11 VITALS — BP 128/85 | HR 70 | Temp 98.2°F | Resp 16 | Ht 61.0 in | Wt 131.0 lb

## 2020-10-11 DIAGNOSIS — D509 Iron deficiency anemia, unspecified: Secondary | ICD-10-CM

## 2020-10-11 DIAGNOSIS — D649 Anemia, unspecified: Secondary | ICD-10-CM | POA: Insufficient documentation

## 2020-10-11 DIAGNOSIS — Z9884 Bariatric surgery status: Secondary | ICD-10-CM | POA: Diagnosis not present

## 2020-10-11 DIAGNOSIS — Z7189 Other specified counseling: Secondary | ICD-10-CM

## 2020-10-11 LAB — CBC WITH DIFFERENTIAL (CANCER CENTER ONLY)
Abs Immature Granulocytes: 0.01 10*3/uL (ref 0.00–0.07)
Basophils Absolute: 0 10*3/uL (ref 0.0–0.1)
Basophils Relative: 1 %
Eosinophils Absolute: 0.1 10*3/uL (ref 0.0–0.5)
Eosinophils Relative: 2 %
HCT: 32.9 % — ABNORMAL LOW (ref 36.0–46.0)
Hemoglobin: 10.9 g/dL — ABNORMAL LOW (ref 12.0–15.0)
Immature Granulocytes: 0 %
Lymphocytes Relative: 37 %
Lymphs Abs: 1.5 10*3/uL (ref 0.7–4.0)
MCH: 26 pg (ref 26.0–34.0)
MCHC: 33.1 g/dL (ref 30.0–36.0)
MCV: 78.3 fL — ABNORMAL LOW (ref 80.0–100.0)
Monocytes Absolute: 0.3 10*3/uL (ref 0.1–1.0)
Monocytes Relative: 8 %
Neutro Abs: 2.1 10*3/uL (ref 1.7–7.7)
Neutrophils Relative %: 52 %
Platelet Count: 173 10*3/uL (ref 150–400)
RBC: 4.2 MIL/uL (ref 3.87–5.11)
RDW: 14.1 % (ref 11.5–15.5)
WBC Count: 4 10*3/uL (ref 4.0–10.5)
nRBC: 0 % (ref 0.0–0.2)

## 2020-10-11 LAB — CMP (CANCER CENTER ONLY)
ALT: 13 U/L (ref 0–44)
AST: 20 U/L (ref 15–41)
Albumin: 3.9 g/dL (ref 3.5–5.0)
Alkaline Phosphatase: 80 U/L (ref 38–126)
Anion gap: 6 (ref 5–15)
BUN: 12 mg/dL (ref 8–23)
CO2: 29 mmol/L (ref 22–32)
Calcium: 8.8 mg/dL — ABNORMAL LOW (ref 8.9–10.3)
Chloride: 105 mmol/L (ref 98–111)
Creatinine: 0.78 mg/dL (ref 0.44–1.00)
GFR, Estimated: >60 mL/min (ref >60)
Glucose, Bld: 84 mg/dL (ref 70–99)
Potassium: 4.7 mmol/L (ref 3.5–5.1)
Sodium: 140 mmol/L (ref 135–145)
Total Bilirubin: 0.4 mg/dL (ref 0.3–1.2)
Total Protein: 6.6 g/dL (ref 6.5–8.1)

## 2020-10-11 LAB — SAVE SMEAR(SSMR), FOR PROVIDER SLIDE REVIEW

## 2020-10-11 LAB — RETICULOCYTES
Immature Retic Fract: 1.5 % — ABNORMAL LOW (ref 2.3–15.9)
RBC.: 4.22 MIL/uL (ref 3.87–5.11)
Retic Count, Absolute: 25.7 10*3/uL (ref 19.0–186.0)
Retic Ct Pct: 0.6 % (ref 0.4–3.1)

## 2020-10-11 LAB — VITAMIN B12: Vitamin B-12: 401 pg/mL (ref 180–914)

## 2020-10-11 NOTE — Progress Notes (Signed)
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  HPI: Denise Rice is incredibly nice 70 year old white female.  She is originally from [place], [place].  She actually has worked for [employer].  She has now retired.  She had gastric bypass about 7 years ago.  She said that she had weighed the 240 pounds when she first had a bypass.  She looks fantastic.  She has done well.  I think she did have an episode about obstruction from adhesions.  She had surgery for this.  She is followed by Dr. Jonni Sanger.  Dr. Jonni Sanger has noted that there has been some anemia.  She is not iron deficient.  His Lutathera has been taking oral iron.  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  She takes vitamin B12 injections.  Back in August, lab work done showed a white count 4.2.  Hemoglobin 11.  Platelet count 166,000.  MCV of 76.  The ferritin was 6 with an iron saturation of 7%.  Because of the persistent anemia, Dr. Jonni Sanger wished for Denise Rice to be seen at the [facility] for an evaluation.  Denise Rice has had no fever.  She had no problems with COVID.  There is been no cough or shortness of breath.  She has had no change in bowel or bladder habits.  There is been no rashes.  She has had no headache.  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  She does work out and does maintain her weight.  I am absolutely impressed with how well she looks.  She does not smoke.  She really does not have much in the way of alcohol.  She is of [place] and nature.  She does not know if there is any thalassemia in the family.  Currently, I would have to say that her performance status is ECOG 0.   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    Vertigo    Vitamin B12 deficiency 07/29/2018   Started IM injections monthly 06/2018   Vitamin D deficiency    Wears hearing aid    both ears  :   Past Surgical History:  Procedure Laterality Date   BLEPHAROPLASTY  03-05-2000   CRANIOTOMY  08/2014   to repair dehescence in right ear   HAMMER TOE SURGERY Left 08-29-2013   2nd toe   IMPLANTATION BONE ANCHORED HEARING AID Left 07/27/2008   left temporal bone;now removed   LAPAROSCOPIC CHOLECYSTECTOMY  03-31-2005   LAPAROSCOPIC GASTRIC BANDING  02-08-2007   LAPAROSCOPY N/A 11/17/2017   Procedure: LAPAROSCOPY LYSIS OF ADHESIONS FOR SMALL BOWEL OBSTRUCTION;  Surgeon: Excell Seltzer, MD;  Location: [place] [facility];  Service: General;  Laterality: N/A;   LASER ABLATION CONDOLAMATA N/A 07/30/2012   Procedure: LASER ABLATION CONDOLAMATA;  Surgeon: Leighton Ruff, MD;  Location: [facility];  Service: General;  Laterality: N/A;   LASIK Bilateral 2002   LYSIS OF ADHESION N/A 11/17/2017   Procedure: LYSIS OF ADHESION;  Surgeon: Excell Seltzer, MD;  Location: [facility];  Service: General;  Laterality: N/A;   ROUX-EN-Y GASTRIC BYPASS  AUG 2013   [place];  1994 x2;  02-03-1998   STRABISMUS SURGERY Right    VAGINAL HYSTERECTOMY  2007   fibroids  :   Current Outpatient Medications:    baclofen (LIORESAL) 10 MG tablet, Take 1/2-1 tablet (5-10 mg total) by mouth 3 (three) times daily as needed for muscle spasms., Disp: 90 tablet, Rfl: 3   busPIRone (BUSPAR) 7.5 MG tablet, TAKE 1 TABLET BY MOUTH TWICE DAILY AS  NEEDED FOR ANXIETY, Disp: 90 tablet, Rfl: 4   cholecalciferol (VITAMIN D3) 25 MCG (1000 UT) tablet, Take 2,000 Units by mouth daily., Disp: , Rfl:    cyanocobalamin (,VITAMIN B-12,) 1000 MCG/ML injection, Inject 1 mL (1,000 mcg total) into the muscle every 30 (thirty) days., Disp: 10 mL, Rfl: 1   diazepam (VALIUM) 10 MG tablet, TAKE 1 TABLET BY MOUTH ONCE DAILY, Disp: 90 tablet, Rfl: 0   ferrous sulfate 325 (65 FE) MG tablet, Take 325 mg by mouth daily with breakfast., Disp: , Rfl:    Syringe/Needle, Disp, (SYRINGE 3CC/25GX1") 25G X 1" 3 ML MISC, Use once monthly for injection of Viatmin B-12, Disp: 50 each, Rfl: 0   traZODone (DESYREL) 100 MG tablet, TAKE 1 TO 1 AND 1/2 TABLETS BY MOUTH AT BEDTIME, Disp: 585 tablet, Rfl: 0:  :   Allergies  Allergen Reactions   Lactose Diarrhea   Topiramate Nausea Only  :   Family History  Problem Relation Age of Onset   Heart attack Mother    Heart disease Mother    Diabetes Mother    Hypertension Mother    Hyperlipidemia Mother    Stroke Mother    Kidney disease Mother    Thyroid disease Mother    Anxiety disorder Mother    Obesity Mother    Cancer Father        unknown primary   Diabetes Father    Hypertension Father    Hyperlipidemia Father    Heart disease Father    Stroke Father    Kidney disease Father    Obesity Father    Stroke Sister    Diabetes Sister    Diabetes Maternal Aunt    Heart disease Maternal Aunt    Cancer Paternal Aunt    Cancer Paternal Uncle    Colon cancer Neg Hx    Colon polyps Neg Hx   :   Social History   Socioeconomic History   Marital status: Married    Spouse name: Vicenta Dunning    Number of children: 1   Years of education: MBA   Highest education level: Not on file  Occupational History   Occupation: Sports coach: [employer]    Comment: [employer]  Tobacco Use   Smoking status: Never   Smokeless tobacco: Never  Vaping Use   Vaping Use: Never used  Substance and  Sexual Activity   Alcohol use: No    Alcohol/week: 0.0 standard drinks   Drug use: No   Sexual activity: Yes  Birth control/protection: Surgical  Other Topics Concern   Not on file  Social History Narrative   Lives at home with husband and daughter.   Left handed.   Caffeine use: 2-3 cups (coffee) per day    Social Determinants of Health   Financial Resource Strain: Not on file  Food Insecurity: Not on file  Transportation Needs: Not on file  Physical Activity: Not on file  Stress: Not on file  Social Connections: Not on file  Intimate Partner Violence: Not on file  :  Review of Systems  Constitutional: Negative.   HENT: Negative.    Eyes: Negative.   Respiratory: Negative.    Cardiovascular: Negative.   Gastrointestinal: Negative.   Genitourinary: Negative.   Musculoskeletal: Negative.   Skin: Negative.   Neurological: Negative.   Endo/Heme/Allergies: Negative.   Psychiatric/Behavioral: Negative.      Exam: @IPVITALS @ This is a well-developed and well-nourished white female in no obvious distress.  Vital signs show a temperature of 98.2.  Pulse 70.  Blood pressure 128/85.  Weight is 131 pounds.  Head and neck exam shows no ocular or oral lesions.  There is no adenopathy in the neck.  Thyroid is nonpalpable.  Lungs are clear bilaterally.  Cardiac exam regular rate and rhythm with no murmurs, rubs or bruits.  Abdomen is soft.  She has good bowel sounds.  There is no fluid wave.  There is no palpable liver or spleen tip.  Extremity shows no clubbing, cyanosis or edema.  She has good range of motion of her joints.  Back exam shows no tenderness over the spine, ribs or hips.  Neurological exam shows no focal neurological deficits.  Skin exam shows no rashes, ecchymoses or petechia.   Recent Labs    10/11/20 1344  WBC 4.0  HGB 10.9*  HCT 32.9*  PLT 173    Recent Labs    10/11/20 1344  NA 140  K 4.7  CL 105  CO2 29  GLUCOSE 84  BUN 12  CREATININE 0.78   CALCIUM 8.8*    Blood smear review: There is mild anisocytosis and poikilocytosis.  There are no target cells.  I see no schistocytes or spherocytes.  She has no nucleated red blood cells.  There is no rouleaux formation.  White blood cells appear normal in morphology and maturation.  She has no hypersegmented polys.  There is no immature myeloid or lymphoid forms.  Platelets are adequate in number and size.  Platelets are well granulated.  Pathology: None    Assessment and Plan: Ms. Rotunno is a very nice 70 year old white female.  She has mild anemia.  She is microcytic.  I would have to suspect that she probably is iron deficient.  I would think that with her gastric bypass, she probably does not absorb iron that she takes orally.  We will have to see what the iron studies show.  If we see significant iron deficiency, we will give her IV iron.  I know this will help get her hemoglobin back up.  She is on vitamin B12 injections.  I do not see any indication that there is a hematologic malignancy.  I do not see anything that looks like myelodysplasia.  I really had a wonderful time talking to Denise Rice.  She is very nice.  She is certainly incredibly eloquent.  She has a lot of experience.  Again, we will have to see what the iron studies show.  I would think that given the  fact that her reticulocyte count is nonexistent, that she is significantly iron deficient.  I will plan to get her back once we get the iron results back that way we will know how much iron to give and when due get her back to see [place].

## 2020-10-12 ENCOUNTER — Encounter: Payer: Self-pay | Admitting: Hematology & Oncology

## 2020-10-12 ENCOUNTER — Other Ambulatory Visit (HOSPITAL_COMMUNITY): Payer: Self-pay

## 2020-10-12 ENCOUNTER — Telehealth: Payer: Self-pay | Admitting: Hematology & Oncology

## 2020-10-12 DIAGNOSIS — D509 Iron deficiency anemia, unspecified: Secondary | ICD-10-CM

## 2020-10-12 DIAGNOSIS — Z7189 Other specified counseling: Secondary | ICD-10-CM

## 2020-10-12 HISTORY — DX: Iron deficiency anemia, unspecified: D50.9

## 2020-10-12 HISTORY — DX: Other specified counseling: Z71.89

## 2020-10-12 LAB — IRON AND TIBC
Iron: 56 ug/dL (ref 41–142)
Saturation Ratios: 13 % — ABNORMAL LOW (ref 21–57)
TIBC: 428 ug/dL (ref 236–444)
UIBC: 372 ug/dL (ref 120–384)

## 2020-10-12 LAB — FERRITIN: Ferritin: 11 ng/mL (ref 11–307)

## 2020-10-12 MED ORDER — HYDROCODONE-ACETAMINOPHEN 5-325 MG PO TABS
1.0000 | ORAL_TABLET | ORAL | 0 refills | Status: DC
  Filled 2020-10-12: qty 10, 3d supply, fill #0

## 2020-10-12 NOTE — Telephone Encounter (Signed)
No los 9/29

## 2020-10-12 NOTE — Addendum Note (Signed)
Addended by: Burney Gauze R on: 10/12/2020 03:05 PM   Modules accepted: Orders

## 2020-10-13 LAB — ERYTHROPOIETIN: Erythropoietin: 6.9 m[IU]/mL (ref 2.6–18.5)

## 2020-10-14 LAB — HGB FRACTIONATION CASCADE
Hgb A2: 2.5 % (ref 1.8–3.2)
Hgb A: 97.5 % (ref 96.4–98.8)
Hgb F: 0 % (ref 0.0–2.0)
Hgb S: 0 %

## 2020-10-15 ENCOUNTER — Telehealth: Payer: Self-pay | Admitting: *Deleted

## 2020-10-15 ENCOUNTER — Other Ambulatory Visit: Payer: Self-pay | Admitting: Family

## 2020-10-15 NOTE — Telephone Encounter (Signed)
Per 10/11/20 los - called and gave upcoming appointments - confirmed - (1) dose of IV Iron

## 2020-10-16 ENCOUNTER — Inpatient Hospital Stay: Payer: Medicare HMO | Attending: Hematology & Oncology

## 2020-10-16 ENCOUNTER — Other Ambulatory Visit: Payer: Self-pay

## 2020-10-16 VITALS — BP 103/63 | HR 78 | Temp 98.8°F | Resp 16

## 2020-10-16 DIAGNOSIS — D508 Other iron deficiency anemias: Secondary | ICD-10-CM

## 2020-10-16 DIAGNOSIS — D509 Iron deficiency anemia, unspecified: Secondary | ICD-10-CM | POA: Diagnosis not present

## 2020-10-16 MED ORDER — SODIUM CHLORIDE 0.9 % IV SOLN: Freq: Once | INTRAVENOUS | Status: AC

## 2020-10-16 MED ORDER — SODIUM CHLORIDE 0.9 % IV SOLN
125.0000 mg | Freq: Once | INTRAVENOUS | Status: AC
  Administered 2020-10-16: 125 mg via INTRAVENOUS
  Filled 2020-10-16: qty 125

## 2020-10-16 MED ORDER — FAMOTIDINE IN NACL 20-0.9 MG/50ML-% IV SOLN: 20.0000 mg | Freq: Once | INTRAVENOUS | Status: DC

## 2020-10-16 MED ORDER — FAMOTIDINE 20 MG IN NS 100 ML IVPB
20.0000 mg | Freq: Once | INTRAVENOUS | Status: AC
  Administered 2020-10-16: 20 mg via INTRAVENOUS
  Filled 2020-10-16: qty 100

## 2020-10-16 NOTE — Patient Instructions (Signed)
Sodium Ferric Gluconate Complex Injection What is this medication? SODIUM FERRIC GLUCONATE COMPLEX (SOE dee um FER ik GLOO koe nate KOM pleks) treats low levels of iron (iron deficiency anemia) in people with kidney disease. Iron is a mineral that plays an important role in making red blood cells, which carry oxygen from your lungs to the rest of your body. This medicine may be used for other purposes; ask your health care provider or pharmacist if you have questions. COMMON BRAND NAME(S): Ferrlecit, Nulecit What should I tell my care team before I take this medication? They need to know if you have any of the following conditions: Anemia that is not from iron deficiency High levels of iron in the blood An unusual or allergic reaction to iron, other medications, foods, dyes, or preservatives Pregnant or are trying to become pregnant Breast-feeding How should I use this medication? This medication is injected into a vein. It is given by your care team in a hospital or clinic setting. Talk to your care team about the use of this medication in children. While it may be prescribed for children as young as 6 years for selected conditions, precautions do apply. Overdosage: If you think you have taken too much of this medicine contact a poison control center or emergency room at once. NOTE: This medicine is only for you. Do not share this medicine with others. What if I miss a dose? It is important not to miss your dose. Call your care team if you are unable to keep an appointment. What may interact with this medication? Do not take this medication with any of the following: Deferasirox Deferoxamine Dimercaprol This medication may also interact with the following: Other iron products This list may not describe all possible interactions. Give your health care provider a list of all the medicines, herbs, non-prescription drugs, or dietary supplements you use. Also tell them if you smoke, drink  alcohol, or use illegal drugs. Some items may interact with your medicine. What should I watch for while using this medication? Your condition will be monitored carefully while you are receiving this medication. Visit your care team for regular checks on your progress. You may need blood work while you are taking this medication. What side effects may I notice from receiving this medication? Side effects that you should report to your care team as soon as possible: Allergic reactions-skin rash, itching, hives, swelling of the face, lips, tongue, or throat Low blood pressure-dizziness, feeling faint or lightheaded, blurry vision Shortness of breath Side effects that usually do not require medical attention (report to your care team if they continue or are bothersome): Flushing Headache Joint pain Muscle pain Nausea Pain, redness, or irritation at injection site This list may not describe all possible side effects. Call your doctor for medical advice about side effects. You may report side effects to FDA at 1-800-FDA-1088. Where should I keep my medication? This medication is given in a hospital or clinic and will not be stored at home. NOTE: This sheet is a summary. It may not cover all possible information. If you have questions about this medicine, talk to your doctor, pharmacist, or health care provider.  2022 Elsevier/Gold Standard (2020-02-13 11:46:34)  

## 2020-10-16 NOTE — Progress Notes (Signed)
Pt completed IV Iron infusion, pt rcvd 20 minutes ofNS flush c/o abdominal pain. VO from Judson Roch, NP for pepcid.

## 2020-10-16 NOTE — Progress Notes (Signed)
Pepcid infused. Patient states she thinks it was just gas, patient burped many times and now states she feels like her old self again. So much better.

## 2020-10-19 ENCOUNTER — Encounter: Payer: Self-pay | Admitting: Family Medicine

## 2020-10-25 ENCOUNTER — Other Ambulatory Visit: Payer: Self-pay

## 2020-10-25 ENCOUNTER — Ambulatory Visit (INDEPENDENT_AMBULATORY_CARE_PROVIDER_SITE_OTHER): Payer: Medicare HMO

## 2020-10-25 VITALS — BP 108/64 | HR 80 | Temp 98.0°F | Wt 131.6 lb

## 2020-10-25 DIAGNOSIS — Z23 Encounter for immunization: Secondary | ICD-10-CM

## 2020-10-25 DIAGNOSIS — Z1231 Encounter for screening mammogram for malignant neoplasm of breast: Secondary | ICD-10-CM

## 2020-10-25 DIAGNOSIS — Z0000 Encounter for general adult medical examination without abnormal findings: Secondary | ICD-10-CM

## 2020-10-25 DIAGNOSIS — E2839 Other primary ovarian failure: Secondary | ICD-10-CM | POA: Diagnosis not present

## 2020-10-25 NOTE — Patient Instructions (Addendum)
Denise Rice , Thank you for taking time to come for your Medicare Wellness Visit. I appreciate your ongoing commitment to your health goals. Please review the following plan we discussed and let me know if I can assist you in the future.   Screening recommendations/referrals: Colonoscopy: Done 10/12/15 repeat every 10 years  Mammogram: Done 12/12/19 repeat every year  Bone Density: Done 02/12/18 repeat every 2 years  Recommended yearly ophthalmology/optometry visit for glaucoma screening and checkup Recommended yearly dental visit for hygiene and checkup  Vaccinations: Influenza vaccine: Completed 10/25/20 Pneumococcal vaccine: Up to date Tdap vaccine: Done 08/12/16 repeat every 10 years  Shingles vaccine: Shingrix discussed. Please contact your pharmacy for coverage information.    Covid-19:Completed 1/11, 1/29, & 10/27/19  Advanced directives: Please bring a copy of your health care power of attorney and living will to the office at your convenience.  Conditions/risks identified: none at this time  Next appointment: Follow up in one year for your annual wellness visit    Preventive Care 65 Years and Older, Female Preventive care refers to lifestyle choices and visits with your health care provider that can promote health and wellness. What does preventive care include? A yearly physical exam. This is also called an annual well check. Dental exams once or twice a year. Routine eye exams. Ask your health care provider how often you should have your eyes checked. Personal lifestyle choices, including: Daily care of your teeth and gums. Regular physical activity. Eating a healthy diet. Avoiding tobacco and drug use. Limiting alcohol use. Practicing safe sex. Taking low-dose aspirin every day. Taking vitamin and mineral supplements as recommended by your health care provider. What happens during an annual well check? The services and screenings done by your health care provider during  your annual well check will depend on your age, overall health, lifestyle risk factors, and family history of disease. Counseling  Your health care provider may ask you questions about your: Alcohol use. Tobacco use. Drug use. Emotional well-being. Home and relationship well-being. Sexual activity. Eating habits. History of falls. Memory and ability to understand (cognition). Work and work Statistician. Reproductive health. Screening  You may have the following tests or measurements: Height, weight, and BMI. Blood pressure. Lipid and cholesterol levels. These may be checked every 5 years, or more frequently if you are over 20 years old. Skin check. Lung cancer screening. You may have this screening every year starting at age 28 if you have a 30-pack-year history of smoking and currently smoke or have quit within the past 15 years. Fecal occult blood test (FOBT) of the stool. You may have this test every year starting at age 58. Flexible sigmoidoscopy or colonoscopy. You may have a sigmoidoscopy every 5 years or a colonoscopy every 10 years starting at age 35. Hepatitis C blood test. Hepatitis B blood test. Sexually transmitted disease (STD) testing. Diabetes screening. This is done by checking your blood sugar (glucose) after you have not eaten for a while (fasting). You may have this done every 1-3 years. Bone density scan. This is done to screen for osteoporosis. You may have this done starting at age 75. Mammogram. This may be done every 1-2 years. Talk to your health care provider about how often you should have regular mammograms. Talk with your health care provider about your test results, treatment options, and if necessary, the need for more tests. Vaccines  Your health care provider may recommend certain vaccines, such as: Influenza vaccine. This is recommended every year.  Tetanus, diphtheria, and acellular pertussis (Tdap, Td) vaccine. You may need a Td booster every 10  years. Zoster vaccine. You may need this after age 58. Pneumococcal 13-valent conjugate (PCV13) vaccine. One dose is recommended after age 23. Pneumococcal polysaccharide (PPSV23) vaccine. One dose is recommended after age 25. Talk to your health care provider about which screenings and vaccines you need and how often you need them. This information is not intended to replace advice given to you by your health care provider. Make sure you discuss any questions you have with your health care provider. Document Released: 01/26/2015 Document Revised: 09/19/2015 Document Reviewed: 10/31/2014 Elsevier Interactive Patient Education  2017 [place] Prevention in the Home Falls can cause injuries. They can happen to people of all ages. There are many things you can do to make your home safe and to help prevent falls. What can I do on the outside of my home? Regularly fix the edges of walkways and driveways and fix any cracks. Remove anything that might make you trip as you walk through a door, such as a raised step or threshold. Trim any bushes or trees on the path to your home. Use bright outdoor lighting. Clear any walking paths of anything that might make someone trip, such as rocks or tools. Regularly check to see if handrails are loose or broken. Make sure that both sides of any steps have handrails. Any raised decks and porches should have guardrails on the edges. Have any leaves, snow, or ice cleared regularly. Use sand or salt on walking paths during winter. Clean up any spills in your garage right away. This includes oil or grease spills. What can I do in the bathroom? Use night lights. Install grab bars by the toilet and in the tub and shower. Do not use towel bars as grab bars. Use non-skid mats or decals in the tub or shower. If you need to sit down in the shower, use a plastic, non-slip stool. Keep the floor dry. Clean up any water that spills on the floor as soon as it  happens. Remove soap buildup in the tub or shower regularly. Attach bath mats securely with double-sided non-slip rug tape. Do not have throw rugs and other things on the floor that can make you trip. What can I do in the bedroom? Use night lights. Make sure that you have a light by your bed that is easy to reach. Do not use any sheets or blankets that are too big for your bed. They should not hang down onto the floor. Have a firm chair that has side arms. You can use this for support while you get dressed. Do not have throw rugs and other things on the floor that can make you trip. What can I do in the kitchen? Clean up any spills right away. Avoid walking on wet floors. Keep items that you use a lot in easy-to-reach places. If you need to reach something above you, use a strong step stool that has a grab bar. Keep electrical cords out of the way. Do not use floor polish or wax that makes floors slippery. If you must use wax, use non-skid floor wax. Do not have throw rugs and other things on the floor that can make you trip. What can I do with my stairs? Do not leave any items on the stairs. Make sure that there are handrails on both sides of the stairs and use them. Fix handrails that are broken or loose.  Make sure that handrails are as long as the stairways. Check any carpeting to make sure that it is firmly attached to the stairs. Fix any carpet that is loose or worn. Avoid having throw rugs at the top or bottom of the stairs. If you do have throw rugs, attach them to the floor with carpet tape. Make sure that you have a light switch at the top of the stairs and the bottom of the stairs. If you do not have them, ask someone to add them for you. What else can I do to help prevent falls? Wear shoes that: Do not have high heels. Have rubber bottoms. Are comfortable and fit you well. Are closed at the toe. Do not wear sandals. If you use a stepladder: Make sure that it is fully opened.  Do not climb a closed stepladder. Make sure that both sides of the stepladder are locked into place. Ask someone to hold it for you, if possible. Clearly mark and make sure that you can see: Any grab bars or handrails. First and last steps. Where the edge of each step is. Use tools that help you move around (mobility aids) if they are needed. These include: Canes. Walkers. Scooters. Crutches. Turn on the lights when you go into a dark area. Replace any light bulbs as soon as they burn out. Set up your furniture so you have a clear path. Avoid moving your furniture around. If any of your floors are uneven, fix them. If there are any pets around you, be aware of where they are. Review your medicines with your doctor. Some medicines can make you feel dizzy. This can increase your chance of falling. Ask your doctor what other things that you can do to help prevent falls. This information is not intended to replace advice given to you by your health care provider. Make sure you discuss any questions you have with your health care provider. Document Released: 10/26/2008 Document Revised: 06/07/2015 Document Reviewed: 02/03/2014 Elsevier Interactive Patient Education  2017 [employer].

## 2020-10-25 NOTE — Progress Notes (Signed)
Subjective:   Denise Rice is a 70 y.o. female who presents for an Initial Medicare Annual Wellness Visit.  Review of Systems     Cardiac Risk Factors include: advanced age (>64men, >76 women)     Objective:    Today's Vitals   10/25/20 1329  BP: 108/64  Pulse: 80  Temp: 98 F (36.7 C)  SpO2: 93%  Weight: 131 lb 9.6 oz (59.7 kg)   Body mass index is 24.87 kg/m.  Advanced Directives 10/25/2020 10/11/2020 09/15/2019 12/13/2018 09/04/2018 08/01/2018 11/18/2017  Does Patient Have a Medical Advance Directive? Yes - Yes Yes No Yes Yes  Type of Paramedic of [place];Living will [place];Living will [place];Living will [place];Living will - [place];Living will [place];Living will  Does patient want to make changes to medical advance directive? - No - Patient declined - - - - No - Patient declined  Copy of [place] in Chart? No - copy requested - No - copy requested - - - No - copy requested  Would patient like information on creating a medical advance directive? - - No - Patient declined - No - Patient declined - -  Pre-existing out of facility DNR order (yellow form or pink MOST form) - - - - - - -    Current Medications (verified) Outpatient Encounter Medications as of 10/25/2020  Medication Sig   baclofen (LIORESAL) 10 MG tablet Take 1/2-1 tablet (5-10 mg total) by mouth 3 (three) times daily as needed for muscle spasms.   busPIRone (BUSPAR) 7.5 MG tablet TAKE 1 TABLET BY MOUTH TWICE DAILY AS NEEDED FOR ANXIETY   cholecalciferol (VITAMIN D3) 25 MCG (1000 UT) tablet Take 2,000 Units by mouth daily.   cyanocobalamin (,VITAMIN B-12,) 1000 MCG/ML injection Inject 1 mL (1,000 mcg total) into the muscle every 30 (thirty) days.   diazepam (VALIUM) 10 MG tablet TAKE 1 TABLET BY MOUTH ONCE DAILY   ferrous sulfate 325 (65 FE) MG tablet Take 325 mg  by mouth daily with breakfast.   Syringe/Needle, Disp, (SYRINGE 3CC/25GX1") 25G X 1" 3 ML MISC Use once monthly for injection of Viatmin B-12   traZODone (DESYREL) 100 MG tablet TAKE 1 TO 1 AND 1/2 TABLETS BY MOUTH AT BEDTIME   [DISCONTINUED] HYDROcodone-acetaminophen (NORCO/VICODIN) 5-325 MG tablet Take 1 tablet by mouth every 6 hours as needed for pain.   No facility-administered encounter medications on file as of 10/25/2020.    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    Vertigo    Vitamin B12 deficiency 07/29/2018  Started IM injections monthly 06/2018   Vitamin D deficiency    Wears hearing aid    both ears   Past Surgical History:  Procedure Laterality Date   BLEPHAROPLASTY  03-05-2000   CRANIOTOMY  08/2014   to repair dehescence in right ear   HAMMER TOE SURGERY Left 08-29-2013   2nd toe   IMPLANTATION BONE ANCHORED HEARING AID Left 07/27/2008   left temporal bone;now removed   LAPAROSCOPIC CHOLECYSTECTOMY  03-31-2005   LAPAROSCOPIC GASTRIC BANDING  02-08-2007   LAPAROSCOPY N/A 11/17/2017   Procedure: LAPAROSCOPY LYSIS OF ADHESIONS FOR SMALL BOWEL OBSTRUCTION;  Surgeon: Excell Seltzer, MD;  Location: [facility];  Service: General;  Laterality: N/A;    [place] N/A 07/30/2012   Procedure: LASER ABLATION CONDOLAMATA;  Surgeon: Leighton Ruff, MD;  Location: [facility];  Service: General;  Laterality: N/A;   LASIK Bilateral 2002   LYSIS OF ADHESION N/A 11/17/2017   Procedure: LYSIS OF ADHESION;  Surgeon: Excell Seltzer, MD;  Location: [facility];  Service: General;  Laterality: N/A;   ROUX-EN-Y GASTRIC BYPASS  AUG 2013   [place];  1994 x2;  02-03-1998   STRABISMUS SURGERY Right    VAGINAL HYSTERECTOMY  2007   fibroids   Family History  Problem Relation Age of Onset   Heart attack Mother    Heart disease Mother    Diabetes Mother    Hypertension Mother    Hyperlipidemia Mother    Stroke Mother    Kidney disease Mother    Thyroid disease Mother    Anxiety disorder Mother    Obesity Mother    Cancer Father        unknown primary   Diabetes Father    Hypertension Father    Hyperlipidemia Father    Heart disease Father    Stroke Father    Kidney disease Father    Obesity Father    Stroke Sister    Diabetes Sister    Diabetes Maternal Aunt    Heart disease Maternal Aunt    Cancer Paternal Aunt    Cancer Paternal Uncle    Colon cancer Neg Hx    Colon polyps Neg Hx    Social History   Socioeconomic History   Marital status: Married    Spouse name: Vicenta Dunning    Number of children: 1   Years of education: MBA   Highest education level: Not on file  Occupational History   Occupation: Sports coach:     Comment: [employer]  Tobacco Use   Smoking status: Never   Smokeless tobacco: Never  Vaping Use   Vaping Use: Never used  Substance and Sexual Activity   Alcohol use: No    Alcohol/week: 0.0 standard drinks   Drug use: No   Sexual activity: Yes    Birth control/protection: Surgical  Other Topics Concern   Not on file  Social History Narrative   Lives at home with husband and daughter.   Left handed.   Caffeine use: 2-3 cups  (coffee) per day    Social Determinants of Health   Financial Resource Strain: Low Risk    Difficulty of Paying Living Expenses: Not hard at all  Food Insecurity: No Food Insecurity   Worried About Charity fundraiser in the Last Year: Never true   Ran Out of Food in the Last Year: Never true  Transportation Needs: No Transportation Needs   Lack of  Transportation (Medical): No   Lack of Transportation (Non-Medical): No  Physical Activity: Sufficiently Active   Days of Exercise per Week: 3 days   Minutes of Exercise per Session: 60 min  Stress: Stress Concern Present   Feeling of Stress : To some extent  Social Connections: Moderately Isolated   Frequency of Communication with Friends and Family: More than three times a week   Frequency of Social Gatherings with Friends and Family: More than three times a week   Attends Religious Services: Never   Marine scientist or Organizations: No   Attends Music therapist: Never   Marital Status: Married    Tobacco Counseling Counseling given: Not Answered   Clinical Intake:  Pre-visit preparation completed: Yes  Pain : No/denies pain     BMI - recorded: 24.87 Nutritional Status: BMI 25 -29 Overweight Nutritional Risks: None Diabetes: No  How often do you need to have someone help you when you read instructions, pamphlets, or other written materials from your doctor or pharmacy?: 1 - Never  Diabetic?No  Interpreter Needed?: No  Information entered by :: Charlott Rakes, LPN   Activities of Daily Living In your present state of health, do you have any difficulty performing the following activities: 10/25/2020  Hearing? Y  Comment wears hearing aid in right ear  Vision? N  Difficulty concentrating or making decisions? N  Walking or climbing stairs? N  Dressing or bathing? N  Doing errands, shopping? N  Preparing Food and eating ? N  Using the Toilet? N  In the past six months, have you accidently  leaked urine? N  Do you have problems with loss of bowel control? N  Managing your Medications? N  Managing your Finances? N  Housekeeping or managing your Housekeeping? N  Some recent data might be hidden    Patient Care Team: [name], MD as PCP - General (Family Medicine) Megan Salon, MD as Consulting Physician (Gynecology) Melvenia Beam, MD as Consulting Physician (Neurology) Junius Roads, Legrand Como, MD as Consulting Physician (Sports Medicine)  Indicate any recent Medical Services you may have received from other than [facility] providers in the past year (date may be approximate).     Assessment:   This is a routine wellness examination for [place].  Hearing/Vision screen Hearing Screening - Comments:: Left ear deaf right impaired with hearing aid  Vision Screening - Comments:: Pt encouraged to follow up with Dr Katy Fitch for eye exams   Dietary issues and exercise activities discussed: Current Exercise Habits: Home exercise routine;Structured exercise class, Type of exercise: Other - see comments;strength training/weights;walking (core workout with trainer), Time (Minutes): 60, Frequency (Times/Week): 3, Weekly Exercise (Minutes/Week): 180   Goals Addressed             This Visit's Progress    Patient Stated       None at this time        Depression Screen PHQ 2/9 Scores 10/25/2020 09/23/2019 05/04/2019 08/03/2018 03/08/2018 03/11/2017  PHQ - 2 Score 0 3 4 3 3  0  PHQ- 9 Score - 6 17 13 11  0    Fall Risk Fall Risk  10/25/2020 09/23/2019 06/23/2018 06/03/2018 03/08/2018  Falls in the past year? 1 1 0 0 0  Number falls in past yr: 1 0 0 0 1  Injury with Fall? 0 1 0 0 0  Risk for fall due to : Impaired balance/gait;Impaired mobility;Impaired vision - - - Other (Comment)  Risk for fall due to:  Comment - - - - Vertigo  Follow up Falls prevention discussed - Falls evaluation completed Falls evaluation completed Falls evaluation completed    FALL RISK PREVENTION PERTAINING TO THE  HOME:   Any stairs in or around the home? Yes  If so, are there any without handrails? No  Home free of loose throw rugs in walkways, pet beds, electrical cords, etc? Yes  Adequate lighting in your home to reduce risk of falls? Yes   ASSISTIVE DEVICES UTILIZED TO PREVENT FALLS:  Life alert? No  Use of a cane, walker or w/c? No  Grab bars in the bathroom? Yes  Shower chair or bench in shower? No  Elevated toilet seat or a handicapped toilet? No   TIMED UP AND GO:  Was the test performed? Yes .  Length of time to ambulate 10 feet: 10 sec.   Gait slow and steady without use of assistive device  Cognitive Function:     6CIT Screen 10/25/2020  What Year? 0 points  What month? 0 points  What time? 0 points  Count back from 20 0 points  Months in reverse 0 points  Repeat phrase 6 points  Total Score 6    Immunizations Immunization History  Administered Date(s) Administered   Fluad Quad(high Dose 65+) 10/01/2018, 09/23/2019   Hepatitis A 08/01/2015, 02/06/2016   Hepatitis B 08/01/2015, 10/03/2015, 02/06/2016   Influenza-Unspecified 09/29/2014, 09/26/2016, 10/01/2018   PFIZER(Purple Top)SARS-COV-2 Vaccination 01/24/2019, 02/11/2019, 10/27/2019   Pneumococcal Conjugate-13 08/25/2014   Pneumococcal Polysaccharide-23 08/15/2013, 06/23/2018   Td 02/14/1995   Tdap 08/12/2016   Zoster, Live 05/19/2013    TDAP status: Up to date  Flu Vaccine status: Completed at today's visit  Pneumococcal vaccine status: Up to date  Covid-19 vaccine status: Completed vaccines  Qualifies for Shingles Vaccine? Yes   Zostavax completed Yes   Shingrix Completed?: No.    Education has been provided regarding the importance of this vaccine. Patient has been advised to call insurance company to determine out of pocket expense if they have not yet received this vaccine. Advised may also receive vaccine at local pharmacy or Health Dept. Verbalized acceptance and understanding.  Screening  Tests Health Maintenance  Topic Date Due   Zoster Vaccines- Shingrix (1 of 2) Never done   COVID-19 Vaccine (4 - Booster for Pfizer series) 01/19/2020   DEXA SCAN  02/13/2020   INFLUENZA VACCINE  08/13/2020   MAMMOGRAM  12/11/2020   COLONOSCOPY (Pts 45-27yrs Insurance coverage will need to be confirmed)  10/11/2025   TETANUS/TDAP  08/13/2026   Hepatitis C Screening  Completed   HPV VACCINES  Aged Out    Health Maintenance  Health Maintenance Due  Topic Date Due   Zoster Vaccines- Shingrix (1 of 2) Never done   COVID-19 Vaccine (4 - Booster for Pfizer series) 01/19/2020   DEXA SCAN  02/13/2020   INFLUENZA VACCINE  08/13/2020     Colorectal cancer screening: Type of screening: Colonoscopy. Completed 10/12/15. Repeat every 10 years  Mammogram status: Completed 11/39/21. Repeat every year  Bone Density status: Completed 02/12/18. Results reflect: Bone density results: OSTEOPENIA. Repeat every 2 years.   Additional Screening:  Hepatitis C Screening:  Completed 09/07/15  Vision Screening: Recommended annual ophthalmology exams for early detection of glaucoma and other disorders of the eye. Is the patient up to date with their annual eye exam?  Yes  Who is the provider or what is the name of the office in which the patient attends annual eye exams?  Pt encouraged to follow up with Dr Katy Fitch  If pt is not established with a provider, would they like to be referred to a provider to establish care? No .   Dental Screening: Recommended annual dental exams for proper oral hygiene  Community Resource Referral / Chronic Care Management: CRR required this visit?  No   CCM required this visit?  No      Plan:     I have personally reviewed and noted the following in the patient's chart:   Medical and social history Use of alcohol, tobacco or illicit drugs  Current medications and supplements including opioid prescriptions. Patient is not currently taking opioid  prescriptions. Functional ability and status Nutritional status Physical activity Advanced directives List of other physicians Hospitalizations, surgeries, and ER visits in previous 12 months Vitals Screenings to include cognitive, depression, and falls Referrals and appointments  In addition, I have reviewed and discussed with patient certain preventive protocols, quality metrics, and best practice recommendations. A written personalized care plan for preventive services as well as general preventive health recommendations were provided to patient.     Willette Brace, LPN   [id number]   Nurse Notes: None

## 2020-10-26 ENCOUNTER — Encounter: Payer: Medicare HMO | Admitting: Plastic Surgery

## 2020-10-30 ENCOUNTER — Encounter: Payer: Self-pay | Admitting: Plastic Surgery

## 2020-10-30 ENCOUNTER — Ambulatory Visit (INDEPENDENT_AMBULATORY_CARE_PROVIDER_SITE_OTHER): Payer: Self-pay | Admitting: Plastic Surgery

## 2020-10-30 ENCOUNTER — Other Ambulatory Visit: Payer: Self-pay

## 2020-10-30 ENCOUNTER — Encounter: Payer: Medicare HMO | Admitting: Plastic Surgery

## 2020-10-30 DIAGNOSIS — Z719 Counseling, unspecified: Secondary | ICD-10-CM

## 2020-10-30 NOTE — Progress Notes (Signed)
Filler Injection Procedure Note  Procedure:  Filler administration  Pre-operative Diagnosis: Rytides   Post-operative Diagnosis: Same  Surgeon: Electronically signed by: Theodoro Kos, DO   Complications:  None  Brief history: The patient desires injection with fillers in her face. I discussed with the patient this proposed procedure of filler injections, which is customized depending on the particular needs of the patient. It is performed on facial volume loss as a temporary correction. The alternatives were discussed with the patient. The risks were addressed including bleeding, scarring, infection, damage to deeper structures, asymmetry, and chronic pain, which may occur infrequently after a procedure. The individual's choice to undergo a surgical procedure is based on the comparison of risks to potential benefits. Other risks include unsatisfactory results, allergic reaction, which should go away with time, bruising and delayed healing. Fillers do not arrest the aging process or produce permanent tightening.  Operative intervention maybe necessary to maintain the results. The patient understands and wishes to proceed. An informed consent was signed and informational brochures given to her prior to the procedure.  Procedure: The area was prepped with chlorhexidine and dried with a clean gauze. Using a clean technique, a 30 gauge needle was then used to inject the filler into the lateral and inferior perioral wrinkles. This was done with one syringe.  No complications were noted. Light pressure was held for 5 minutes. She was instructed explicitly in post-operative care.  Restylane Defyne LOT: [id number]  EXP: 2021-01-12

## 2020-11-02 ENCOUNTER — Encounter: Payer: Medicare HMO | Admitting: Plastic Surgery

## 2020-11-06 ENCOUNTER — Other Ambulatory Visit (HOSPITAL_COMMUNITY): Payer: Self-pay

## 2020-11-06 DIAGNOSIS — H00012 Hordeolum externum right lower eyelid: Secondary | ICD-10-CM | POA: Diagnosis not present

## 2020-11-06 MED FILL — Buspirone HCl Tab 7.5 MG: ORAL | 45 days supply | Qty: 90 | Fill #3 | Status: AC

## 2020-11-07 ENCOUNTER — Other Ambulatory Visit (HOSPITAL_COMMUNITY): Payer: Self-pay

## 2020-11-07 MED ORDER — AMOXICILLIN-POT CLAVULANATE 875-125 MG PO TABS
ORAL_TABLET | ORAL | 0 refills | Status: DC
  Filled 2020-11-07: qty 14, 7d supply, fill #0

## 2020-11-13 ENCOUNTER — Other Ambulatory Visit: Payer: Self-pay | Admitting: Family Medicine

## 2020-11-13 DIAGNOSIS — E2839 Other primary ovarian failure: Secondary | ICD-10-CM

## 2020-11-16 ENCOUNTER — Encounter: Payer: Self-pay | Admitting: Family Medicine

## 2020-11-16 ENCOUNTER — Other Ambulatory Visit: Payer: Self-pay

## 2020-11-16 ENCOUNTER — Ambulatory Visit (INDEPENDENT_AMBULATORY_CARE_PROVIDER_SITE_OTHER): Payer: Medicare HMO | Admitting: Family Medicine

## 2020-11-16 ENCOUNTER — Other Ambulatory Visit (HOSPITAL_COMMUNITY): Payer: Self-pay

## 2020-11-16 VITALS — BP 116/80 | HR 70 | Temp 98.1°F | Ht 61.0 in | Wt 129.2 lb

## 2020-11-16 DIAGNOSIS — D508 Other iron deficiency anemias: Secondary | ICD-10-CM | POA: Diagnosis not present

## 2020-11-16 DIAGNOSIS — F5102 Adjustment insomnia: Secondary | ICD-10-CM | POA: Diagnosis not present

## 2020-11-16 DIAGNOSIS — E538 Deficiency of other specified B group vitamins: Secondary | ICD-10-CM | POA: Diagnosis not present

## 2020-11-16 DIAGNOSIS — R42 Dizziness and giddiness: Secondary | ICD-10-CM | POA: Diagnosis not present

## 2020-11-16 DIAGNOSIS — Z0000 Encounter for general adult medical examination without abnormal findings: Secondary | ICD-10-CM

## 2020-11-16 DIAGNOSIS — R7301 Impaired fasting glucose: Secondary | ICD-10-CM | POA: Diagnosis not present

## 2020-11-16 DIAGNOSIS — Z9884 Bariatric surgery status: Secondary | ICD-10-CM | POA: Diagnosis not present

## 2020-11-16 DIAGNOSIS — H903 Sensorineural hearing loss, bilateral: Secondary | ICD-10-CM

## 2020-11-16 MED ORDER — TRAZODONE HCL 100 MG PO TABS
100.0000 mg | ORAL_TABLET | Freq: Every day | ORAL | 3 refills | Status: DC
  Filled 2020-11-16 – 2020-12-03 (×2): qty 135, 90d supply, fill #0
  Filled 2021-03-01: qty 135, 90d supply, fill #1
  Filled 2021-06-01: qty 135, 90d supply, fill #2
  Filled 2021-08-05 – 2021-08-14 (×2): qty 135, 90d supply, fill #3

## 2020-11-16 MED ORDER — SHINGRIX 50 MCG/0.5ML IM SUSR: 0.5000 mL | Freq: Once | INTRAMUSCULAR | 0 refills | Status: AC

## 2020-11-16 NOTE — Progress Notes (Signed)
Subjective  Chief Complaint  Patient presents with   Annual Exam    Not fasting   Depression   Anxiety    HPI: Denise Rice is a 70 y.o. female who presents to [facility] [place] today for a Female Wellness Visit. She also has the concerns and/or needs as listed above in the chief complaint. These will be addressed in addition to the Health Maintenance Visit.   Wellness Visit: annual visit with health maintenance review and exam without Pap  HM: Denise Rice is now working part-time at [employer] and limiting it.  Less stress has made her much happier.  She is physically well.  Works out regularly.  Mammogram due next month and schedule.  Other screens are up-to-date. Chronic disease f/u and/or acute problem visit: (deemed necessary to be done in addition to the wellness visit): History of depression: No longer needing medications.  Happy.  No anxiety symptoms. Chronic vertigo: Much improved with balance work and core strengthening. Reviewed iron deficiency anemia evaluation per hematology.  Has received 1 iron infusion.  Has follow-up there.  Vitamin B12 and D deficiencies are improving. Most recent A1c was mildly elevated with impaired fasting glucose.  She eats healthy.  Weight is stable.  No symptoms of hypoglycemia  Assessment  1. Annual physical exam   2. Bilateral sensorineural hearing loss   3. Chronic vertigo   4. Adjustment insomnia   5. 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 Other iron deficiency anemia   7. Vitamin B12 deficiency   8. IFG (impaired fasting glucose)      Plan  Female Wellness Visit: Age appropriate Health Maintenance and Prevention measures were discussed with patient. Included topics are cancer screening recommendations, ways to keep healthy (see AVS) including dietary and exercise recommendations, regular eye and dental care, use of seat belts, and avoidance of moderate alcohol use and tobacco use.  Mammogram scheduled BMI: discussed patient's  BMI and encouraged positive lifestyle modifications to help get to or maintain a target BMI. HM needs and immunizations were addressed and ordered. See below for orders. See HM and immunization section for updates.  Counseling for Shingrix and prescription given.  Patient to schedule Routine labs and screening tests ordered including cmp, cbc and lipids where appropriate. Discussed recommendations regarding Vit D and calcium supplementation (see AVS)  Chronic disease management visit and/or acute problem visit: Chronic medical problems are very well controlled.  No changes in medications today.  Follow up: 12 months for complete physical and follow-up No orders of the defined types were placed in this encounter.  Meds ordered this encounter  Medications   Zoster Vaccine Adjuvanted [facility]) injection    Sig: Inject 0.5 mLs into the muscle once for 1 dose. Please give 2nd dose 2-6 months after first dose    Dispense:  2 each    Refill:  0   traZODone (DESYREL) 100 MG tablet    Sig: Take 1-1.5 tablets (100-150 mg total) by mouth at bedtime.    Dispense:  135 tablet    Refill:  3      Body mass index is 24.41 kg/m. Wt Readings from Last 3 Encounters:  11/16/20 129 lb 3.2 oz (58.6 kg)  10/25/20 131 lb 9.6 oz (59.7 kg)  10/11/20 131 lb (59.4 kg)     Patient Active Problem List   Diagnosis Date Noted   Bilateral sensorineural hearing loss 09/06/2014    Priority: High   Chronic migraine 05/04/2019  Priority: Medium     Managed by Neurology    History of small bowel obstruction 11/17/2017    Priority: Medium    Chronic vertigo 03/11/2017    Priority: Medium    Major depression, chronic 03/11/2017    Priority: Medium    PCOS (polycystic ovarian syndrome) 03/11/2017    Priority: Medium    Insomnia 09/08/2015    Priority: Medium    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 06/25/2012    Priority: Medium    History of laparoscopic adjustable gastric banding 01/22/2011    Priority: Medium     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    Priority: Low   Vitamin D deficiency 01/15/2008    Priority: Low   IFG (impaired fasting glucose) 11/16/2020   Iron deficiency anemia 10/12/2020   Vitamin B12 deficiency 07/29/2018    Started IM injections monthly 06/2018    Elschnig bodies following cataract surgery, 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   Health Maintenance  Topic Date Due   Zoster Vaccines- Shingrix (1 of 2) Never done   COVID-19 Vaccine (4 - Booster for Pfizer series) 12/22/2019   DEXA SCAN  02/13/2020   MAMMOGRAM  12/11/2020   COLONOSCOPY (Pts 45-61yrs Insurance coverage will need to be confirmed)  10/11/2025   TETANUS/TDAP  08/13/2026   Pneumonia Vaccine 82+ Years old  Completed   INFLUENZA VACCINE  Completed   Hepatitis C Screening  Completed   HPV VACCINES  Aged Out   Immunization History  Administered Date(s) Administered   Fluad Quad(high Dose 65+) 10/01/2018, 09/23/2019, 10/25/2020   Hepatitis A 08/01/2015, 02/06/2016   Hepatitis B 08/01/2015, 10/03/2015, 02/06/2016   Influenza-Unspecified 09/29/2014, 09/26/2016, 10/01/2018   PFIZER(Purple Top)SARS-COV-2 Vaccination 01/24/2019, 02/11/2019, 10/27/2019   Pneumococcal Conjugate-13 08/25/2014   Pneumococcal Polysaccharide-23 08/15/2013, 06/23/2018   Td 02/14/1995   Tdap 08/12/2016   Zoster, Live 05/19/2013   We updated and reviewed the patient's past history in detail and it is documented below. Allergies: Patient is allergic to lactose and topiramate. 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 Past Surgical History Patient  has a past surgical history that includes Vaginal hysterectomy (2007); Roux-en-Y Gastric Bypass (AUG 2013); Laparoscopic gastric banding (02-08-2007); Strabismus surgery (Right); Laparoscopic cholecystectomy (03-31-2005); Implantation bone anchored hearing aid (Left, 07/27/2008); Stapedes surgery (Left, 1985;  1994 x2;  02-03-1998); Blepharoplasty (03-05-2000); Laser ablation condolamata (N/A, 07/30/2012); Hammer toe surgery (Left, 08-29-2013); LASIK (Bilateral, 2002); Craniotomy (08/2014); laparoscopy (N/A, 11/17/2017); and Lysis of adhesion (N/A, 11/17/2017). Family History: Patient family history includes Anxiety disorder in her mother; Cancer in her father, paternal aunt, and paternal uncle; Diabetes in her father, maternal aunt, mother, and sister; Heart attack in her mother; Heart disease in her father, maternal aunt, and mother; Hyperlipidemia in her father and mother; Hypertension in her father and mother; Kidney disease in her father and mother; Obesity in her father and mother; Stroke in her father, mother, and sister; Thyroid disease in her mother. Social History:  Patient  reports that she has never smoked.  She has never used smokeless tobacco. She reports that she does not drink alcohol and does not use drugs.  Review of Systems: Constitutional: negative for fever or malaise Ophthalmic: negative for photophobia, double vision or loss of vision Cardiovascular: negative for chest pain, dyspnea on exertion, or new LE swelling Respiratory: negative for SOB or persistent cough Gastrointestinal: negative for abdominal pain, change in bowel habits or melena Genitourinary: negative for dysuria or  gross hematuria, no abnormal uterine bleeding or disharge Musculoskeletal: negative for new gait disturbance or muscular weakness Integumentary: negative for new or persistent rashes, no breast lumps Neurological: negative for TIA or stroke symptoms Psychiatric: negative for SI or delusions Allergic/Immunologic: negative for hives  Patient Care Team    Relationship Specialty Notifications Start End  [name], MD PCP - General Family Medicine  03/11/17   Megan Salon, MD Consulting Physician Gynecology  03/11/17   Melvenia Beam, MD Consulting Physician Neurology  11/25/17   Eunice Blase, MD Consulting Physician Sports Medicine  05/04/19     Objective  Vitals: BP 116/80   Pulse 70   Temp 98.1 F (36.7 C) (Temporal)   Ht 5\' 1"  (1.549 m)   Wt 129 lb 3.2 oz (58.6 kg)   LMP 01/13/2005   SpO2 99%   BMI 24.41 kg/m  General:  Well developed, well nourished, no acute distress  Psych:  Alert and orientedx3,normal mood and affect HEENT:  Normocephalic, atraumatic, non-icteric sclera,  supple neck without adenopathy, mass or thyromegaly Cardiovascular:  Normal S1, S2, RRR without gallop, rub or murmur Respiratory:  Good breath sounds bilaterally, CTAB with normal respiratory effort Gastrointestinal: normal bowel sounds, soft, non-tender, no noted masses. No HSM MSK: no deformities, contusions. Joints are without erythema or swelling.  Skin:  Warm, no rashes or suspicious lesions noted Neurologic:    Mental status is normal. Normal gait. No tremor  No visits with results within 1 Day(s) from this visit.  Latest known visit with results is:  Appointment on 10/11/2020  Component Date Value Ref Range Status   WBC Count 10/11/2020 4.0  4.0 - 10.5 K/uL Final   RBC 10/11/2020 4.20  3.87 - 5.11 MIL/uL Final   Hemoglobin 10/11/2020 10.9 (A)  12.0 - 15.0 g/dL Final   HCT 10/11/2020 32.9 (A)  36.0 - 46.0 % Final   MCV 10/11/2020 78.3 (A)  80.0 - 100.0 fL Final   MCH 10/11/2020 26.0   26.0 - 34.0 pg Final   MCHC 10/11/2020 33.1  30.0 - 36.0 g/dL Final   RDW 10/11/2020 14.1  11.5 - 15.5 % Final   Platelet Count 10/11/2020 173  150 - 400 K/uL Final   nRBC 10/11/2020 0.0  0.0 - 0.2 % Final   Neutrophils Relative % 10/11/2020 52  % Final   Neutro Abs 10/11/2020 2.1  1.7 - 7.7 K/uL Final   Lymphocytes Relative 10/11/2020 37  % Final   Lymphs Abs 10/11/2020 1.5  0.7 - 4.0 K/uL Final   Monocytes Relative 10/11/2020 8  % Final   Monocytes Absolute 10/11/2020 0.3  0.1 - 1.0 K/uL Final   Eosinophils Relative 10/11/2020 2  % Final   Eosinophils Absolute 10/11/2020 0.1  0.0 - 0.5 K/uL Final   Basophils Relative 10/11/2020 1  % Final   Basophils Absolute 10/11/2020 0.0  0.0 - 0.1 K/uL Final   Immature Granulocytes 10/11/2020 0  % Final   Abs Immature Granulocytes 10/11/2020 0.01  0.00 - 0.07 K/uL Final   Sodium  10/11/2020 140  135 - 145 mmol/L Final   Potassium 10/11/2020 4.7  3.5 - 5.1 mmol/L Final   Chloride 10/11/2020 105  98 - 111 mmol/L Final   CO2 10/11/2020 29  22 - 32 mmol/L Final   Glucose, Bld 10/11/2020 84  70 - 99 mg/dL Final   BUN 10/11/2020 12  8 - 23 mg/dL Final   Creatinine 10/11/2020 0.78  0.44 - 1.00 mg/dL Final   Calcium 10/11/2020 8.8 (A)  8.9 - 10.3 mg/dL Final   Total Protein 10/11/2020 6.6  6.5 - 8.1 g/dL Final   Albumin 10/11/2020 3.9  3.5 - 5.0 g/dL Final   AST 10/11/2020 20  15 - 41 U/L Final   ALT 10/11/2020 13  0 - 44 U/L Final   Alkaline Phosphatase 10/11/2020 80  38 - 126 U/L Final   Total Bilirubin 10/11/2020 0.4  0.3 - 1.2 mg/dL Final   GFR, Estimated 10/11/2020 >60  >60 mL/min Final   Anion gap 10/11/2020 6  5 - 15 Final   Vitamin B-12 10/11/2020 401  180 - 914 pg/mL Final   Erythropoietin 10/11/2020 6.9  2.6 - 18.5 mIU/mL Final   Ferritin 10/11/2020 11  11 - 307 ng/mL Final   Retic Ct Pct 10/11/2020 0.6  0.4 - 3.1 % Final   RBC. 10/11/2020 4.22  3.87 - 5.11 MIL/uL Final   Retic Count, Absolute 10/11/2020 25.7  19.0 - 186.0 K/uL Final    Immature Retic Fract 10/11/2020 1.5 (A)  2.3 - 15.9 % Final   Iron 10/11/2020 56  41 - 142 ug/dL Final   TIBC 10/11/2020 428  236 - 444 ug/dL Final   Saturation Ratios 10/11/2020 13 (A)  21 - 57 % Final   UIBC 10/11/2020 372  120 - 384 ug/dL Final   Smear Review 10/11/2020 SMEAR STAINED AND AVAILABLE FOR REVIEW   Final   Hgb F 10/11/2020 0.0  0.0 - 2.0 % Final   Hgb A 10/11/2020 97.5  96.4 - 98.8 % Final   Hgb A2 10/11/2020 2.5  1.8 - 3.2 % Final   Hgb S 10/11/2020 0.0  0.0 % Final   Interpretation, Hgb Fract 10/11/2020 Comment   Final   Lab Results  Component Value Date   CHOL 163 08/03/2018   HDL 75 08/03/2018   LDLCALC 67 08/03/2018   TRIG 107 08/03/2018   CHOLHDL 2 06/23/2018   Lab Results  Component Value Date   CREATININE 0.78 10/11/2020   BUN 12 10/11/2020   NA 140 10/11/2020   K 4.7 10/11/2020   CL 105 10/11/2020   CO2 29 10/11/2020   Lab Results  Component Value Date   HGBA1C 6.0 08/27/2020     Commons side effects, risks, benefits, and alternatives for medications and treatment plan prescribed today were discussed, and the patient expressed understanding of the given instructions. Patient is instructed to call or message via MyChart if he/she has any questions or concerns regarding our treatment plan. No barriers to understanding were identified. We discussed Red Flag symptoms and signs in detail. Patient expressed understanding regarding what to do in case of urgent or emergency type symptoms.  Medication list was reconciled, printed and provided to the patient in AVS. Patient instructions and summary information was reviewed with the patient as documented in the AVS. This note was prepared with assistance of Dragon voice recognition software. Occasional wrong-word or sound-a-like substitutions may have occurred due to the inherent limitations of voice recognition software  This  visit occurred during the SARS-CoV-2 public health emergency.  Safety protocols were in  place, including screening questions prior to the visit, additional usage of staff PPE, and extensive cleaning of exam room while observing appropriate contact time as indicated for disinfecting solutions.

## 2020-11-16 NOTE — Patient Instructions (Signed)
Please return in 12 months for your annual complete physical; please come fasting.   Please take the prescription for Shingrix to the pharmacy so they may administer the vaccinations. Your insurance will then cover the injections.   If you have any questions or concerns, please don't hesitate to send me a message via MyChart or call the office at [phone]. Thank you for visiting with [place] today! It's our pleasure caring for you.

## 2020-11-20 DIAGNOSIS — H903 Sensorineural hearing loss, bilateral: Secondary | ICD-10-CM | POA: Diagnosis not present

## 2020-11-21 ENCOUNTER — Other Ambulatory Visit (HOSPITAL_COMMUNITY): Payer: Self-pay

## 2020-11-21 ENCOUNTER — Encounter: Payer: Self-pay | Admitting: Hematology & Oncology

## 2020-11-21 ENCOUNTER — Inpatient Hospital Stay: Payer: Medicare HMO | Attending: Hematology & Oncology

## 2020-11-21 ENCOUNTER — Inpatient Hospital Stay: Payer: Medicare HMO | Admitting: Hematology & Oncology

## 2020-11-21 ENCOUNTER — Other Ambulatory Visit: Payer: Self-pay

## 2020-11-21 VITALS — BP 124/74 | HR 66 | Temp 98.0°F | Resp 20 | Wt 130.0 lb

## 2020-11-21 DIAGNOSIS — D510 Vitamin B12 deficiency anemia due to intrinsic factor deficiency: Secondary | ICD-10-CM | POA: Insufficient documentation

## 2020-11-21 DIAGNOSIS — D500 Iron deficiency anemia secondary to blood loss (chronic): Secondary | ICD-10-CM | POA: Diagnosis not present

## 2020-11-21 DIAGNOSIS — D509 Iron deficiency anemia, unspecified: Secondary | ICD-10-CM

## 2020-11-21 LAB — CMP (CANCER CENTER ONLY)
ALT: 22 U/L (ref 0–44)
AST: 28 U/L (ref 15–41)
Albumin: 4 g/dL (ref 3.5–5.0)
Alkaline Phosphatase: 64 U/L (ref 38–126)
Anion gap: 6 (ref 5–15)
BUN: 17 mg/dL (ref 8–23)
CO2: 30 mmol/L (ref 22–32)
Calcium: 8.9 mg/dL (ref 8.9–10.3)
Chloride: 104 mmol/L (ref 98–111)
Creatinine: 0.72 mg/dL (ref 0.44–1.00)
GFR, Estimated: >60 mL/min (ref >60)
Glucose, Bld: 100 mg/dL — ABNORMAL HIGH (ref 70–99)
Potassium: 3.9 mmol/L (ref 3.5–5.1)
Sodium: 140 mmol/L (ref 135–145)
Total Bilirubin: 0.3 mg/dL (ref 0.3–1.2)
Total Protein: 6.4 g/dL — ABNORMAL LOW (ref 6.5–8.1)

## 2020-11-21 LAB — CBC WITH DIFFERENTIAL (CANCER CENTER ONLY)
Abs Immature Granulocytes: 0.01 10*3/uL (ref 0.00–0.07)
Basophils Absolute: 0 10*3/uL (ref 0.0–0.1)
Basophils Relative: 1 %
Eosinophils Absolute: 0 10*3/uL (ref 0.0–0.5)
Eosinophils Relative: 1 %
HCT: 33.7 % — ABNORMAL LOW (ref 36.0–46.0)
Hemoglobin: 11.3 g/dL — ABNORMAL LOW (ref 12.0–15.0)
Immature Granulocytes: 0 %
Lymphocytes Relative: 35 %
Lymphs Abs: 1.4 10*3/uL (ref 0.7–4.0)
MCH: 26.6 pg (ref 26.0–34.0)
MCHC: 33.5 g/dL (ref 30.0–36.0)
MCV: 79.3 fL — ABNORMAL LOW (ref 80.0–100.0)
Monocytes Absolute: 0.3 10*3/uL (ref 0.1–1.0)
Monocytes Relative: 8 %
Neutro Abs: 2.2 10*3/uL (ref 1.7–7.7)
Neutrophils Relative %: 55 %
Platelet Count: 167 10*3/uL (ref 150–400)
RBC: 4.25 MIL/uL (ref 3.87–5.11)
RDW: 14.5 % (ref 11.5–15.5)
WBC Count: 4.1 10*3/uL (ref 4.0–10.5)
nRBC: 0 % (ref 0.0–0.2)

## 2020-11-21 LAB — RETICULOCYTES
Immature Retic Fract: 2.4 % (ref 2.3–15.9)
RBC.: 4.23 MIL/uL (ref 3.87–5.11)
Retic Count, Absolute: 32.6 10*3/uL (ref 19.0–186.0)
Retic Ct Pct: 0.8 % (ref 0.4–3.1)

## 2020-11-21 NOTE — Progress Notes (Signed)
[name]  Denise Rice [id number] Nov 08, 1950 70 y.o. 11/21/2020   Principle Diagnosis:  Iron deficiency anemia Pernicious anemia  Current Therapy:   IV iron-  Ferrlicit given on 29/09/3714 Vit B12 1000 mcg  IM q month     Interim History:  Denise Rice is back for follow-up.  We last saw her her first saw her back in late September.  At that time, she was slightly anemic.  She had a low iron level.  Her ferritin was 11 with an iron saturation of 13%.  She had a normal vitamin B-12 of 401..  She did get a dose of Ferrlecit on 10/16/2020.  She is feeling little bit better.  She is still working.  She works at a [employer], commercial.  I am sure that they will be quite busy over the holidays.  She has had no problems with bleeding.  There is no change in bowel or bladder habits.  There is no cough or shortness of breath.  She has had no rashes.  She has had no nausea or vomiting.  Her appetite has been pretty good.  Patient recently had a birthday.  It was a quiet birthday for she and her family.  Overall, I would say performance status is probably ECOG 0.  Medications:  Current Outpatient Medications:    busPIRone (BUSPAR) 7.5 MG tablet, TAKE 1 TABLET BY MOUTH TWICE DAILY AS NEEDED FOR ANXIETY, Disp: 90 tablet, Rfl: 4   Cholecalciferol (VITAMIN D) 50 MCG (2000 UT) CAPS, Take 2,000 Units by mouth daily., Disp: , Rfl:    cyanocobalamin (,VITAMIN B-12,) 1000 MCG/ML injection, Inject 1 mL (1,000 mcg total) into the muscle every 30 (thirty) days., Disp: 10 mL, Rfl: 1   diazepam (VALIUM) 10 MG tablet, TAKE 1 TABLET BY MOUTH ONCE DAILY, Disp: 90 tablet, Rfl: 0   ferrous sulfate 325 (65 FE) MG tablet, Take 325 mg by mouth daily with breakfast., Disp: , Rfl:    Syringe/Needle, Disp, (SYRINGE 3CC/25GX1") 25G X 1" 3 ML MISC, Use once monthly for injection of Vitamin B-12, Disp: 50 each, Rfl: 0   traZODone (DESYREL) 100 MG tablet, Take 1 to 1 & 1/2 tablets by mouth at bedtime., Disp:  135 tablet, Rfl: 3   amoxicillin-clavulanate (AUGMENTIN) 875-125 MG tablet, Take 1 tablet by mouth 2 times a day until gone, Disp: 14 tablet, Rfl: 0  Allergies:  Allergies  Allergen Reactions   Lactose Diarrhea   Topiramate Nausea Only    Past Medical History, Surgical history, Social history, and Family History were reviewed and updated.  Review of Systems: Review of Systems  Constitutional: Negative.   HENT:  Negative.    Eyes: Negative.   Respiratory: Negative.    Cardiovascular: Negative.   Gastrointestinal: Negative.   Endocrine: Negative.   Genitourinary: Negative.    Musculoskeletal: Negative.   Skin: Negative.   Neurological: Negative.   Hematological: Negative.   Psychiatric/Behavioral: Negative.     Physical Exam:  weight is 130 lb (59 kg). Her oral temperature is 98 F (36.7 C). Her blood pressure is 124/74 and her pulse is 66. Her respiration is 20 and oxygen saturation is 100%.   Wt Readings from Last 3 Encounters:  11/21/20 130 lb (59 kg)  11/16/20 129 lb 3.2 oz (58.6 kg)  10/25/20 131 lb 9.6 oz (59.7 kg)    Physical Exam Vitals reviewed.  HENT:     Head: Normocephalic and atraumatic.  Eyes:  Pupils: Pupils are equal, round, and reactive to light.  Cardiovascular:     Rate and Rhythm: Normal rate and regular rhythm.     Heart sounds: Normal heart sounds.  Pulmonary:     Effort: Pulmonary effort is normal.     Breath sounds: Normal breath sounds.  Abdominal:     General: Bowel sounds are normal.     Palpations: Abdomen is soft.  Musculoskeletal:        General: No tenderness or deformity. Normal range of motion.     Cervical back: Normal range of motion.  Lymphadenopathy:     Cervical: No cervical adenopathy.  Skin:    General: Skin is warm and dry.     Findings: No erythema or rash.  Neurological:     Mental Status: She is alert and oriented to person, place, and time.  Psychiatric:        Behavior: Behavior normal.        Thought  Content: Thought content normal.        Judgment: Judgment normal.     Lab Results  Component Value Date   WBC 4.1 11/21/2020   HGB 11.3 (L) 11/21/2020   HCT 33.7 (L) 11/21/2020   MCV 79.3 (L) 11/21/2020   PLT 167 11/21/2020     Chemistry      Component Value Date/Time   NA 140 11/21/2020 1534   NA 140 08/03/2018 0853   K 3.9 11/21/2020 1534   CL 104 11/21/2020 1534   CO2 30 11/21/2020 1534   BUN 17 11/21/2020 1534   BUN 15 08/03/2018 0853   CREATININE 0.72 11/21/2020 1534   CREATININE 0.64 09/07/2015 1014      Component Value Date/Time   CALCIUM 8.9 11/21/2020 1534   ALKPHOS 64 11/21/2020 1534   AST 28 11/21/2020 1534   ALT 22 11/21/2020 1534   BILITOT 0.3 11/21/2020 1534      Impression and Plan: Denise Rice is a very charming 70 year old white female.  She has anemia.  Sh has multifactorial anemia.  She has iron deficiency.  She is vitamin B12 deficient.  She takes vitamin B12 at home.  She likely is still iron deficient.  We will see what her iron levels look like.  If she is still iron deficient, I will see about giving her a dose of Monoferric.  I give her much more iron this way.  Hopefully, we will be able to get her through the holidays.  If her iron is low, we will see about giving her a dose probably next week.  I am just glad that she is doing well overall.  I know that she enjoys her job.  Hopefully, she will be able to continue to work and feel well.    [name], MD 11/9/20225:03 PM

## 2020-11-22 ENCOUNTER — Telehealth: Payer: Self-pay

## 2020-11-22 LAB — IRON AND TIBC
Iron: 63 ug/dL (ref 41–142)
Saturation Ratios: 18 % — ABNORMAL LOW (ref 21–57)
TIBC: 355 ug/dL (ref 236–444)
UIBC: 292 ug/dL (ref 120–384)

## 2020-11-22 LAB — FERRITIN: Ferritin: 26 ng/mL (ref 11–307)

## 2020-11-22 NOTE — Telephone Encounter (Signed)
-----   Message from [name], MD sent at 11/22/2020  2:06 PM EST ----- Please call her and let her know that the iron is low but better.  I think 1 more dose of iron will help Denise Rice out.  Please set this up.  Thanks.  [name]

## 2020-11-22 NOTE — Telephone Encounter (Signed)
LMOM advising pt of lab results (ok per DPR). Advised pt we need to check to see if Monoferric is covered by her insurance and we will call her once we get a response.

## 2020-11-27 DIAGNOSIS — H903 Sensorineural hearing loss, bilateral: Secondary | ICD-10-CM | POA: Diagnosis not present

## 2020-12-03 ENCOUNTER — Other Ambulatory Visit: Payer: Self-pay | Admitting: Physician Assistant

## 2020-12-04 ENCOUNTER — Other Ambulatory Visit (HOSPITAL_COMMUNITY): Payer: Self-pay

## 2020-12-04 NOTE — Telephone Encounter (Signed)
Pt requesting refill on Valium. Last OV 11/16/2020.

## 2020-12-05 ENCOUNTER — Other Ambulatory Visit (HOSPITAL_COMMUNITY): Payer: Self-pay

## 2020-12-05 MED ORDER — DIAZEPAM 10 MG PO TABS
ORAL_TABLET | Freq: Every day | ORAL | 3 refills | Status: DC
  Filled 2020-12-05: qty 30, 30d supply, fill #0
  Filled 2021-01-04: qty 30, 30d supply, fill #1
  Filled 2021-02-02: qty 30, 30d supply, fill #2
  Filled 2021-03-01 – 2021-03-02 (×3): qty 30, 30d supply, fill #3
  Filled 2021-04-03: qty 30, 30d supply, fill #4
  Filled 2021-05-03: qty 30, 30d supply, fill #5
  Filled 2021-06-01: qty 30, 30d supply, fill #6

## 2020-12-10 ENCOUNTER — Other Ambulatory Visit: Payer: Self-pay

## 2020-12-10 ENCOUNTER — Inpatient Hospital Stay: Payer: Medicare HMO

## 2020-12-10 VITALS — BP 126/78 | HR 68 | Temp 97.8°F | Resp 17

## 2020-12-10 DIAGNOSIS — D509 Iron deficiency anemia, unspecified: Secondary | ICD-10-CM | POA: Diagnosis not present

## 2020-12-10 DIAGNOSIS — D510 Vitamin B12 deficiency anemia due to intrinsic factor deficiency: Secondary | ICD-10-CM | POA: Diagnosis not present

## 2020-12-10 DIAGNOSIS — D508 Other iron deficiency anemias: Secondary | ICD-10-CM

## 2020-12-10 MED ORDER — SODIUM CHLORIDE 0.9 % IV SOLN
125.0000 mg | Freq: Once | INTRAVENOUS | Status: AC
  Administered 2020-12-10: 12:00:00 125 mg via INTRAVENOUS
  Filled 2020-12-10: qty 125

## 2020-12-10 MED ORDER — SODIUM CHLORIDE 0.9 % IV SOLN
Freq: Once | INTRAVENOUS | Status: AC
Start: 2020-12-10 — End: 2020-12-10

## 2020-12-10 NOTE — Patient Instructions (Signed)

## 2020-12-12 ENCOUNTER — Ambulatory Visit: Payer: Medicare HMO | Attending: Internal Medicine

## 2020-12-12 DIAGNOSIS — Z23 Encounter for immunization: Secondary | ICD-10-CM

## 2020-12-12 NOTE — Progress Notes (Signed)
   Covid-19 Vaccination Clinic  Name:  Denise Rice    MRN: [id number] DOB: 01-Feb-1950  12/12/2020  [name] was observed post Covid-19 immunization for 15 minutes without incident. She was provided with Vaccine Information Sheet and instruction to access the V-Safe system.   [name] was instructed to call 911 with any severe reactions post vaccine: Difficulty breathing  Swelling of face and throat  A fast heartbeat  A bad rash all over body  Dizziness and weakness   Immunizations Administered     Name Date Dose VIS Date Route   Pfizer Covid-19 Vaccine Bivalent Booster 12/12/2020 11:39 AM 0.3 mL 09/12/2020 Intramuscular   Manufacturer: [place]   Lot: [id number]   [place]: [phone]

## 2020-12-13 ENCOUNTER — Ambulatory Visit: Payer: Medicare HMO

## 2020-12-15 ENCOUNTER — Other Ambulatory Visit (HOSPITAL_BASED_OUTPATIENT_CLINIC_OR_DEPARTMENT_OTHER): Payer: Self-pay

## 2020-12-15 MED ORDER — PFIZER COVID-19 VAC BIVALENT 30 MCG/0.3ML IM SUSP
INTRAMUSCULAR | 0 refills | Status: DC
  Filled 2020-12-15: qty 0.3, 1d supply, fill #0

## 2020-12-17 ENCOUNTER — Other Ambulatory Visit (HOSPITAL_BASED_OUTPATIENT_CLINIC_OR_DEPARTMENT_OTHER): Payer: Self-pay

## 2020-12-27 ENCOUNTER — Other Ambulatory Visit: Payer: Self-pay | Admitting: Family Medicine

## 2020-12-27 DIAGNOSIS — Z1231 Encounter for screening mammogram for malignant neoplasm of breast: Secondary | ICD-10-CM

## 2020-12-28 ENCOUNTER — Ambulatory Visit: Payer: Medicare HMO

## 2020-12-31 ENCOUNTER — Other Ambulatory Visit: Payer: Self-pay | Admitting: Family Medicine

## 2020-12-31 DIAGNOSIS — H18832 Recurrent erosion of cornea, left eye: Secondary | ICD-10-CM | POA: Diagnosis not present

## 2020-12-31 DIAGNOSIS — H00012 Hordeolum externum right lower eyelid: Secondary | ICD-10-CM | POA: Diagnosis not present

## 2021-01-01 ENCOUNTER — Other Ambulatory Visit (HOSPITAL_COMMUNITY): Payer: Self-pay

## 2021-01-01 MED ORDER — BUSPIRONE HCL 7.5 MG PO TABS
ORAL_TABLET | Freq: Two times a day (BID) | ORAL | 4 refills | Status: DC | PRN
  Filled 2021-01-01: qty 90, 45d supply, fill #0
  Filled 2021-03-15: qty 90, 45d supply, fill #1
  Filled 2021-03-31: qty 90, 45d supply, fill #2
  Filled 2021-05-17: qty 90, 45d supply, fill #3
  Filled 2021-08-05: qty 90, 45d supply, fill #4

## 2021-01-04 ENCOUNTER — Other Ambulatory Visit (HOSPITAL_COMMUNITY): Payer: Self-pay

## 2021-01-22 ENCOUNTER — Encounter: Payer: Self-pay | Admitting: Family

## 2021-01-29 ENCOUNTER — Ambulatory Visit: Payer: No Typology Code available for payment source

## 2021-02-04 ENCOUNTER — Encounter: Payer: Self-pay | Admitting: Family

## 2021-02-04 ENCOUNTER — Other Ambulatory Visit (HOSPITAL_COMMUNITY): Payer: Self-pay

## 2021-02-05 ENCOUNTER — Other Ambulatory Visit (HOSPITAL_COMMUNITY): Payer: Self-pay

## 2021-02-26 ENCOUNTER — Other Ambulatory Visit: Payer: Self-pay

## 2021-02-26 ENCOUNTER — Encounter: Payer: Self-pay | Admitting: Family Medicine

## 2021-02-26 DIAGNOSIS — R42 Dizziness and giddiness: Secondary | ICD-10-CM

## 2021-02-27 ENCOUNTER — Other Ambulatory Visit: Payer: Self-pay

## 2021-02-28 ENCOUNTER — Telehealth: Payer: Self-pay

## 2021-02-28 NOTE — Telephone Encounter (Signed)
Denise Rice would like a video visit to discuss a referral for Palliative services. Her husband has been in and out of the hospital and she really needs help.

## 2021-03-02 ENCOUNTER — Other Ambulatory Visit (HOSPITAL_COMMUNITY): Payer: Self-pay

## 2021-03-02 ENCOUNTER — Encounter: Payer: Self-pay | Admitting: Family

## 2021-03-06 ENCOUNTER — Other Ambulatory Visit (HOSPITAL_COMMUNITY): Payer: Self-pay

## 2021-03-06 NOTE — Telephone Encounter (Signed)
See note

## 2021-03-12 ENCOUNTER — Other Ambulatory Visit: Payer: Self-pay

## 2021-03-12 ENCOUNTER — Telehealth: Payer: Self-pay

## 2021-03-12 DIAGNOSIS — N186 End stage renal disease: Secondary | ICD-10-CM

## 2021-03-12 NOTE — Telephone Encounter (Signed)
Referral for palliative care for patient has been put in.

## 2021-03-15 ENCOUNTER — Encounter: Payer: Self-pay | Admitting: Family

## 2021-03-15 ENCOUNTER — Other Ambulatory Visit (HOSPITAL_COMMUNITY): Payer: Self-pay

## 2021-03-15 NOTE — Telephone Encounter (Signed)
Message sent to [facility]. ?

## 2021-04-01 ENCOUNTER — Other Ambulatory Visit (HOSPITAL_COMMUNITY): Payer: Self-pay

## 2021-04-04 ENCOUNTER — Other Ambulatory Visit (HOSPITAL_COMMUNITY): Payer: Self-pay

## 2021-04-12 ENCOUNTER — Ambulatory Visit
Admission: RE | Admit: 2021-04-12 | Discharge: 2021-04-12 | Disposition: A | Discharge status: Home / Self Care | Payer: No Typology Code available for payment source | Source: Ambulatory Visit | Attending: Family Medicine | Admitting: Family Medicine

## 2021-04-12 DIAGNOSIS — M810 Age-related osteoporosis without current pathological fracture: Secondary | ICD-10-CM | POA: Diagnosis not present

## 2021-04-12 DIAGNOSIS — M85851 Other specified disorders of bone density and structure, right thigh: Secondary | ICD-10-CM | POA: Diagnosis not present

## 2021-04-12 DIAGNOSIS — E2839 Other primary ovarian failure: Secondary | ICD-10-CM

## 2021-04-12 DIAGNOSIS — Z1231 Encounter for screening mammogram for malignant neoplasm of breast: Secondary | ICD-10-CM

## 2021-04-12 DIAGNOSIS — Z780 Asymptomatic menopausal state: Secondary | ICD-10-CM | POA: Diagnosis not present

## 2021-04-13 ENCOUNTER — Other Ambulatory Visit (HOSPITAL_COMMUNITY): Payer: Self-pay

## 2021-04-15 ENCOUNTER — Encounter: Payer: Self-pay | Admitting: Family

## 2021-04-15 ENCOUNTER — Telehealth: Payer: Self-pay | Admitting: *Deleted

## 2021-04-15 ENCOUNTER — Other Ambulatory Visit (HOSPITAL_COMMUNITY): Payer: Self-pay

## 2021-04-15 NOTE — Telephone Encounter (Signed)
Per Dr. Jonni Sanger this has already been taken care of. ?

## 2021-04-15 NOTE — Telephone Encounter (Signed)
Call received from patient that she has received iron infusions in the past and has been feeling very run down like her iron levels are low.  Transferred to scheduler, appts will be made for lab and to see Lottie Dawson NP ?

## 2021-04-17 ENCOUNTER — Encounter: Payer: Self-pay | Admitting: Family Medicine

## 2021-04-17 DIAGNOSIS — M810 Age-related osteoporosis without current pathological fracture: Secondary | ICD-10-CM | POA: Insufficient documentation

## 2021-04-19 ENCOUNTER — Other Ambulatory Visit: Payer: Self-pay | Admitting: Family

## 2021-04-19 DIAGNOSIS — E538 Deficiency of other specified B group vitamins: Secondary | ICD-10-CM

## 2021-04-19 DIAGNOSIS — D500 Iron deficiency anemia secondary to blood loss (chronic): Secondary | ICD-10-CM

## 2021-04-22 ENCOUNTER — Inpatient Hospital Stay: Payer: No Typology Code available for payment source | Attending: Family

## 2021-04-22 ENCOUNTER — Encounter: Payer: Self-pay | Admitting: Family

## 2021-04-22 ENCOUNTER — Other Ambulatory Visit: Payer: Self-pay

## 2021-04-22 ENCOUNTER — Inpatient Hospital Stay (HOSPITAL_BASED_OUTPATIENT_CLINIC_OR_DEPARTMENT_OTHER): Payer: No Typology Code available for payment source | Admitting: Family

## 2021-04-22 VITALS — BP 116/71 | HR 66 | Temp 98.6°F | Resp 18 | Ht 61.0 in | Wt 124.1 lb

## 2021-04-22 DIAGNOSIS — D500 Iron deficiency anemia secondary to blood loss (chronic): Secondary | ICD-10-CM | POA: Diagnosis not present

## 2021-04-22 DIAGNOSIS — E538 Deficiency of other specified B group vitamins: Secondary | ICD-10-CM

## 2021-04-22 DIAGNOSIS — D509 Iron deficiency anemia, unspecified: Secondary | ICD-10-CM | POA: Insufficient documentation

## 2021-04-22 DIAGNOSIS — D510 Vitamin B12 deficiency anemia due to intrinsic factor deficiency: Secondary | ICD-10-CM | POA: Diagnosis not present

## 2021-04-22 LAB — CBC WITH DIFFERENTIAL (CANCER CENTER ONLY)
Abs Immature Granulocytes: 0 10*3/uL (ref 0.00–0.07)
Basophils Absolute: 0 10*3/uL (ref 0.0–0.1)
Basophils Relative: 1 %
Eosinophils Absolute: 0.1 10*3/uL (ref 0.0–0.5)
Eosinophils Relative: 1 %
HCT: 35.8 % — ABNORMAL LOW (ref 36.0–46.0)
Hemoglobin: 12.1 g/dL (ref 12.0–15.0)
Immature Granulocytes: 0 %
Lymphocytes Relative: 25 %
Lymphs Abs: 1.1 10*3/uL (ref 0.7–4.0)
MCH: 27.8 pg (ref 26.0–34.0)
MCHC: 33.8 g/dL (ref 30.0–36.0)
MCV: 82.1 fL (ref 80.0–100.0)
Monocytes Absolute: 0.3 10*3/uL (ref 0.1–1.0)
Monocytes Relative: 6 %
Neutro Abs: 2.9 10*3/uL (ref 1.7–7.7)
Neutrophils Relative %: 67 %
Platelet Count: 172 10*3/uL (ref 150–400)
RBC: 4.36 MIL/uL (ref 3.87–5.11)
RDW: 13.1 % (ref 11.5–15.5)
WBC Count: 4.4 10*3/uL (ref 4.0–10.5)
nRBC: 0 % (ref 0.0–0.2)

## 2021-04-22 LAB — CMP (CANCER CENTER ONLY)
ALT: 20 U/L (ref 0–44)
AST: 24 U/L (ref 15–41)
Albumin: 4.2 g/dL (ref 3.5–5.0)
Alkaline Phosphatase: 61 U/L (ref 38–126)
Anion gap: 5 (ref 5–15)
BUN: 11 mg/dL (ref 8–23)
CO2: 29 mmol/L (ref 22–32)
Calcium: 8.8 mg/dL — ABNORMAL LOW (ref 8.9–10.3)
Chloride: 103 mmol/L (ref 98–111)
Creatinine: 0.78 mg/dL (ref 0.44–1.00)
GFR, Estimated: >60 mL/min (ref >60)
Glucose, Bld: 111 mg/dL — ABNORMAL HIGH (ref 70–99)
Potassium: 4 mmol/L (ref 3.5–5.1)
Sodium: 137 mmol/L (ref 135–145)
Total Bilirubin: 0.5 mg/dL (ref 0.3–1.2)
Total Protein: 6.5 g/dL (ref 6.5–8.1)

## 2021-04-22 LAB — VITAMIN B12: Vitamin B-12: 473 pg/mL (ref 180–914)

## 2021-04-22 LAB — RETICULOCYTES
Immature Retic Fract: 2.9 % (ref 2.3–15.9)
RBC.: 4.31 MIL/uL (ref 3.87–5.11)
Retic Count, Absolute: 44 10*3/uL (ref 19.0–186.0)
Retic Ct Pct: 1 % (ref 0.4–3.1)

## 2021-04-22 LAB — IRON AND IRON BINDING CAPACITY (CC-WL,HP ONLY)
Iron: 61 ug/dL (ref 28–170)
Saturation Ratios: 16 % (ref 10.4–31.8)
TIBC: 379 ug/dL (ref 250–450)
UIBC: 318 ug/dL (ref 148–442)

## 2021-04-22 LAB — LACTATE DEHYDROGENASE: LDH: 171 U/L (ref 98–192)

## 2021-04-22 NOTE — Progress Notes (Signed)
?[name] ? ?[name] ?[id number] ?[birth year] 71 y.o. ?04/22/2021 ? ? ?Principle Diagnosis:  ?Iron deficiency anemia ?Pernicious anemia ?  ?Current Therapy:        ?IV iron as indicated  ?Vit B12 1000 mcg IM q month - self administers ?  ?Interim History:  [name] is here today for follow-up. She is doing fairly well but notes fatigue that has been persistent lately.  ?No blood loss noted. No petechiae.  ?No fever, chills, n/v, cough, rash, SOB, chest pain, palpitations, abdominal pain or changes in bowel or bladder habits.  ?She has occasional episodes of dizziness secondary to vertigo.  ?No swelling, numbness or tingling in her extremities.   ?She has pain in her hands that waxes and wanes and trigger finger.  ?No falls or syncope to report.   ?She has maintained a good appetite but admits that she needs to better hydrate throughout the day. Her weight is 124 lbs.  ? ?ECOG Performance Status: 0 - Asymptomatic ? ?Medications:  ?Allergies as of 04/22/2021   ? ?   Reactions  ? Lactose Diarrhea  ? Topiramate Nausea Only  ? ?  ? ?  ?Medication List  ?  ? ?  ? Accurate as of April 22, 2021  1:02 PM. If you have any questions, ask your nurse or doctor.  ?  ?  ? ?  ? ?amoxicillin-clavulanate 875-125 MG tablet ?Commonly known as: AUGMENTIN ?Take 1 tablet by mouth 2 times a day until gone ?  ?B-D 3CC LUER-LOK SYR 25GX1" 25G X 1" 3 ML Misc ?Generic drug: SYRINGE-NEEDLE (DISP) 3 ML ?Use once monthly for injection of Vitamin B-12 ?  ?busPIRone 7.5 MG tablet ?Commonly known as: BUSPAR ?TAKE 1 TABLET BY MOUTH TWICE DAILY AS NEEDED FOR ANXIETY ?  ?cyanocobalamin 1000 MCG/ML injection ?Commonly known as: (VITAMIN B-12) ?Inject 1 mL (1,000 mcg total) into the muscle every 30 (thirty) days. ?  ?diazepam 10 MG tablet ?Commonly known as: VALIUM ?TAKE 1 TABLET BY MOUTH ONCE DAILY ?  ?ferrous sulfate 325 (65 FE) MG tablet ?Take 325 mg by mouth daily with breakfast. ?  ?Pfizer COVID-19 Vac Bivalent  injection ?Generic drug: COVID-19 mRNA bivalent vaccine [employer]) ?Inject into the muscle. ?  ?traZODone 100 MG tablet ?Commonly known as: DESYREL ?Take 1 to 1 & 1/2 tablets by mouth at bedtime. ?  ?Vitamin D 50 MCG (2000 UT) Caps ?Take 2,000 Units by mouth daily. ?  ? ?  ? ? ?Allergies:  ?Allergies  ?Allergen Reactions  ? Lactose Diarrhea  ? Topiramate Nausea Only  ? ? ?Past Medical History, Surgical history, Social history, and Family History were reviewed and updated. ? ?Review of Systems: ?All other 10 point review of systems is negative.  ? ?Physical Exam: ? vitals were not taken for this visit.  ? ?Wt Readings from Last 3 Encounters:  ?11/21/20 130 lb (59 kg)  ?11/16/20 129 lb 3.2 oz (58.6 kg)  ?10/25/20 131 lb 9.6 oz (59.7 kg)  ? ? ?Ocular: Sclerae unicteric, pupils equal, round and reactive to light ?Ear-nose-throat: Oropharynx clear, dentition fair ?Lymphatic: No cervical or supraclavicular adenopathy ?Lungs no rales or rhonchi, good excursion bilaterally ?Heart regular rate and rhythm, no murmur appreciated ?Abd soft, nontender, positive bowel sounds ?MSK no focal spinal tenderness, no joint edema ?Neuro: non-focal, well-oriented, appropriate affect ?Breasts: Deferred  ? ?Lab Results  ?Component Value Date  ? WBC 4.4 04/22/2021  ? HGB 12.1 04/22/2021  ? HCT 35.8 (  L) 04/22/2021  ? MCV 82.1 04/22/2021  ? PLT 172 04/22/2021  ? ?Lab Results  ?Component Value Date  ? FERRITIN 26 11/21/2020  ? IRON 63 11/21/2020  ? TIBC 355 11/21/2020  ? UIBC 292 11/21/2020  ? IRONPCTSAT 18 (L) 11/21/2020  ? ?Lab Results  ?Component Value Date  ? RETICCTPCT 1.0 04/22/2021  ? RBC 4.36 04/22/2021  ? ?No results found for: KPAFRELGTCHN, LAMBDASER, KAPLAMBRATIO ?Lab Results  ?Component Value Date  ? IGGSERUM 1,028 07/02/2016  ? IGMSERUM 109 07/02/2016  ? ?No results found for: TOTALPROTELP, ALBUMINELP, A1GS, A2GS, BETS, BETA2SER, GAMS, MSPIKE, SPEI ?  Chemistry   ?   ?Component Value Date/Time  ? NA 140 11/21/2020 1534  ? NA 140  08/03/2018 0853  ? K 3.9 11/21/2020 1534  ? CL 104 11/21/2020 1534  ? CO2 30 11/21/2020 1534  ? BUN 17 11/21/2020 1534  ? BUN 15 08/03/2018 0853  ? CREATININE 0.72 11/21/2020 1534  ? CREATININE 0.64 09/07/2015 1014  ?    ?Component Value Date/Time  ? CALCIUM 8.9 11/21/2020 1534  ? ALKPHOS 64 11/21/2020 1534  ? AST 28 11/21/2020 1534  ? ALT 22 11/21/2020 1534  ? BILITOT 0.3 11/21/2020 1534  ?  ? ? ? ?Impression and Plan: [name] is a very pleasant 71 yo caucasian female with history of multifactorial anemia.  ?Iron studies and B 12 level are pending. ?Follow-up in 6 months.  ? ?Lottie Dawson, NP ?4/10/20231:02 PM ? ?

## 2021-04-23 ENCOUNTER — Encounter: Payer: Self-pay | Admitting: Hematology & Oncology

## 2021-04-23 LAB — FERRITIN: Ferritin: 30 ng/mL (ref 11–307)

## 2021-04-27 ENCOUNTER — Encounter: Payer: Self-pay | Admitting: Plastic Surgery

## 2021-05-03 ENCOUNTER — Other Ambulatory Visit (HOSPITAL_COMMUNITY): Payer: Self-pay

## 2021-05-06 ENCOUNTER — Encounter: Payer: No Typology Code available for payment source | Admitting: Plastic Surgery

## 2021-05-13 ENCOUNTER — Encounter: Payer: Self-pay | Admitting: Plastic Surgery

## 2021-05-18 ENCOUNTER — Other Ambulatory Visit (HOSPITAL_COMMUNITY): Payer: Self-pay

## 2021-05-29 ENCOUNTER — Encounter: Payer: Self-pay | Admitting: Family Medicine

## 2021-05-29 DIAGNOSIS — H9072 Mixed conductive and sensorineural hearing loss, unilateral, left ear, with unrestricted hearing on the contralateral side: Secondary | ICD-10-CM | POA: Diagnosis not present

## 2021-05-29 DIAGNOSIS — R42 Dizziness and giddiness: Secondary | ICD-10-CM | POA: Diagnosis not present

## 2021-06-01 ENCOUNTER — Other Ambulatory Visit (HOSPITAL_COMMUNITY): Payer: Self-pay

## 2021-06-14 ENCOUNTER — Encounter: Payer: Self-pay | Admitting: Plastic Surgery

## 2021-06-14 ENCOUNTER — Ambulatory Visit (INDEPENDENT_AMBULATORY_CARE_PROVIDER_SITE_OTHER): Payer: Self-pay | Admitting: Student

## 2021-06-14 DIAGNOSIS — Z719 Counseling, unspecified: Secondary | ICD-10-CM

## 2021-06-14 NOTE — Progress Notes (Signed)
Preoperative Dx: Skin aging   Postoperative Dx:  same  Procedure: laser to face and neck   Anesthesia: none  Description of Procedure:  Risks and complications were explained to the patient. Consent was confirmed. All information was confirmed to be correct. The area  area was prepped with alcohol and wiped dry.   Halo Laser: see picture in media for laser parameters and coverage   Patient tolerated the procedure well and there were no complications. Discussed postprocedure care with patient.   Patient to call with any questions or concerns.

## 2021-06-28 ENCOUNTER — Other Ambulatory Visit: Payer: Self-pay | Admitting: Family Medicine

## 2021-06-28 MED ORDER — DIAZEPAM 10 MG PO TABS
ORAL_TABLET | Freq: Every day | ORAL | 0 refills | Status: DC
Start: 2021-06-28 — End: 2021-07-29
  Filled 2021-06-28: qty 30, fill #0
  Filled 2021-07-01: qty 30, 30d supply, fill #0

## 2021-06-28 MED ORDER — "SYRINGE 25G X 1"" 3 ML MISC"
0 refills | Status: AC
  Filled 2021-06-28 – 2021-07-19 (×2): qty 3, 90d supply, fill #0
  Filled 2021-10-14: qty 3, 90d supply, fill #1
  Filled 2022-01-13 – 2022-01-14 (×2): qty 3, 90d supply, fill #2
  Filled 2022-05-05 – 2022-05-06 (×2): qty 3, 90d supply, fill #3

## 2021-06-29 ENCOUNTER — Other Ambulatory Visit (HOSPITAL_COMMUNITY): Payer: Self-pay

## 2021-07-01 ENCOUNTER — Encounter: Payer: Self-pay | Admitting: Family

## 2021-07-01 ENCOUNTER — Other Ambulatory Visit (HOSPITAL_COMMUNITY): Payer: Self-pay

## 2021-07-02 ENCOUNTER — Other Ambulatory Visit (HOSPITAL_COMMUNITY): Payer: Self-pay

## 2021-07-09 ENCOUNTER — Other Ambulatory Visit (HOSPITAL_COMMUNITY): Payer: Self-pay

## 2021-07-20 ENCOUNTER — Other Ambulatory Visit (HOSPITAL_COMMUNITY): Payer: Self-pay

## 2021-07-22 ENCOUNTER — Other Ambulatory Visit (HOSPITAL_COMMUNITY): Payer: Self-pay

## 2021-07-23 ENCOUNTER — Encounter: Payer: No Typology Code available for payment source | Admitting: Plastic Surgery

## 2021-07-28 ENCOUNTER — Encounter: Payer: Self-pay | Admitting: Family Medicine

## 2021-07-29 ENCOUNTER — Other Ambulatory Visit (HOSPITAL_COMMUNITY): Payer: Self-pay

## 2021-07-29 MED ORDER — DIAZEPAM 10 MG PO TABS
ORAL_TABLET | Freq: Every day | ORAL | 5 refills | Status: DC
Start: 2021-07-29 — End: 2021-09-02
  Filled 2021-07-29: qty 30, 30d supply, fill #0
  Filled 2021-08-31: qty 30, 30d supply, fill #1

## 2021-08-06 ENCOUNTER — Encounter: Payer: Self-pay | Admitting: Family

## 2021-08-06 ENCOUNTER — Other Ambulatory Visit (HOSPITAL_COMMUNITY): Payer: Self-pay

## 2021-08-14 ENCOUNTER — Encounter (HOSPITAL_BASED_OUTPATIENT_CLINIC_OR_DEPARTMENT_OTHER): Payer: Self-pay | Admitting: Emergency Medicine

## 2021-08-14 ENCOUNTER — Other Ambulatory Visit (HOSPITAL_COMMUNITY): Payer: Self-pay

## 2021-08-14 ENCOUNTER — Observation Stay (HOSPITAL_BASED_OUTPATIENT_CLINIC_OR_DEPARTMENT_OTHER)
Admission: EM | Admit: 2021-08-14 | Discharge: 2021-08-16 | Disposition: A | Discharge status: Home / Self Care | Payer: No Typology Code available for payment source | Attending: Student | Admitting: Student

## 2021-08-14 DIAGNOSIS — K56609 Unspecified intestinal obstruction, unspecified as to partial versus complete obstruction: Principal | ICD-10-CM | POA: Diagnosis present

## 2021-08-14 DIAGNOSIS — D649 Anemia, unspecified: Secondary | ICD-10-CM | POA: Diagnosis not present

## 2021-08-14 DIAGNOSIS — Z9884 Bariatric surgery status: Secondary | ICD-10-CM | POA: Diagnosis not present

## 2021-08-14 DIAGNOSIS — K5730 Diverticulosis of large intestine without perforation or abscess without bleeding: Secondary | ICD-10-CM | POA: Diagnosis not present

## 2021-08-14 DIAGNOSIS — K7689 Other specified diseases of liver: Secondary | ICD-10-CM | POA: Diagnosis not present

## 2021-08-14 DIAGNOSIS — R1084 Generalized abdominal pain: Secondary | ICD-10-CM | POA: Diagnosis present

## 2021-08-14 DIAGNOSIS — Z66 Do not resuscitate: Secondary | ICD-10-CM | POA: Diagnosis present

## 2021-08-14 DIAGNOSIS — Z79899 Other long term (current) drug therapy: Secondary | ICD-10-CM | POA: Diagnosis not present

## 2021-08-14 DIAGNOSIS — D696 Thrombocytopenia, unspecified: Secondary | ICD-10-CM | POA: Insufficient documentation

## 2021-08-14 DIAGNOSIS — F419 Anxiety disorder, unspecified: Secondary | ICD-10-CM | POA: Diagnosis present

## 2021-08-14 DIAGNOSIS — K529 Noninfective gastroenteritis and colitis, unspecified: Secondary | ICD-10-CM

## 2021-08-14 DIAGNOSIS — N281 Cyst of kidney, acquired: Secondary | ICD-10-CM | POA: Diagnosis not present

## 2021-08-14 LAB — CBC WITH DIFFERENTIAL/PLATELET
Abs Immature Granulocytes: 0.02 10*3/uL (ref 0.00–0.07)
Basophils Absolute: 0 10*3/uL (ref 0.0–0.1)
Basophils Relative: 1 %
Eosinophils Absolute: 0.1 10*3/uL (ref 0.0–0.5)
Eosinophils Relative: 1 %
HCT: 36.3 % (ref 36.0–46.0)
Hemoglobin: 12.4 g/dL (ref 12.0–15.0)
Immature Granulocytes: 0 %
Lymphocytes Relative: 19 %
Lymphs Abs: 1 10*3/uL (ref 0.7–4.0)
MCH: 28.4 pg (ref 26.0–34.0)
MCHC: 34.2 g/dL (ref 30.0–36.0)
MCV: 83.1 fL (ref 80.0–100.0)
Monocytes Absolute: 0.4 10*3/uL (ref 0.1–1.0)
Monocytes Relative: 7 %
Neutro Abs: 3.9 10*3/uL (ref 1.7–7.7)
Neutrophils Relative %: 72 %
Platelets: 185 10*3/uL (ref 150–400)
RBC: 4.37 MIL/uL (ref 3.87–5.11)
RDW: 13.2 % (ref 11.5–15.5)
WBC: 5.4 10*3/uL (ref 4.0–10.5)
nRBC: 0 % (ref 0.0–0.2)

## 2021-08-14 LAB — URINALYSIS, ROUTINE W REFLEX MICROSCOPIC
Bilirubin Urine: NEGATIVE
Glucose, UA: NEGATIVE mg/dL
Hgb urine dipstick: NEGATIVE
Ketones, ur: NEGATIVE mg/dL
Leukocytes,Ua: NEGATIVE
Nitrite: NEGATIVE
Protein, ur: NEGATIVE mg/dL
Specific Gravity, Urine: 1.025 (ref 1.005–1.030)
pH: 5.5 (ref 5.0–8.0)

## 2021-08-14 MED ORDER — HYDROMORPHONE HCL 1 MG/ML IJ SOLN
1.0000 mg | Freq: Once | INTRAMUSCULAR | Status: AC
  Administered 2021-08-14: 1 mg via INTRAVENOUS
  Filled 2021-08-14: qty 1

## 2021-08-14 MED ORDER — ONDANSETRON HCL 4 MG/2ML IJ SOLN
4.0000 mg | Freq: Once | INTRAMUSCULAR | Status: AC
  Administered 2021-08-14: 4 mg via INTRAVENOUS
  Filled 2021-08-14: qty 2

## 2021-08-14 MED ORDER — SODIUM CHLORIDE 0.9 % IV BOLUS
1000.0000 mL | Freq: Once | INTRAVENOUS | Status: AC
  Administered 2021-08-14: 1000 mL via INTRAVENOUS

## 2021-08-14 NOTE — ED Triage Notes (Signed)
Generalized Abdominal pain since 2030, vomiting x 5 since then  States "feel like chainsaw is cutting through to my back"  H/O Bowel obstruction 5 years ago with sx

## 2021-08-14 NOTE — ED Provider Notes (Signed)
[name] DEPT [facility] Provider Note: Georgena Spurling, MD, FACEP  CSN: [id number] MRN: [id number] ARRIVAL: 08/14/21 at 2254 ROOM: [place]  Abdominal Pain   HISTORY OF PRESENT ILLNESS  08/14/21 11:21 PM Denise Rice is a 71 y.o. female with a history of multiple abdominal surgeries.  She is here with generalized abdominal pain since 8:30 PM.  She has had associated nausea and vomiting and estimates she has vomited 5 times.  She describes the pain as feeling like a chainsaw cutting through to her back, and and she rates it as a 10 out of 10.  Pain is like previous bowel obstruction 5 years ago.   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    Vertigo    Vitamin B12 deficiency 07/29/2018   Started IM injections monthly 06/2018   Vitamin D deficiency     Past Surgical History:  Procedure Laterality Date   BLEPHAROPLASTY  03-05-2000   CRANIOTOMY  08/2014   to repair dehescence in right ear   HAMMER TOE SURGERY Left 08-29-2013   2nd toe   IMPLANTATION BONE ANCHORED HEARING AID Left 07/27/2008   left temporal bone;now removed   LAPAROSCOPIC CHOLECYSTECTOMY  03-31-2005   LAPAROSCOPIC GASTRIC BANDING  02-08-2007   LAPAROSCOPY N/A 11/17/2017   Procedure: LAPAROSCOPY LYSIS OF ADHESIONS FOR SMALL BOWEL OBSTRUCTION;  Surgeon: Excell Seltzer, MD;  Location: [place];  Service: General;  Laterality: N/A;   [place] N/A 07/30/2012   Procedure: LASER ABLATION CONDOLAMATA;  Surgeon: Leighton Ruff, MD;  Location: [facility];  Service: General;  Laterality: N/A;   LASIK Bilateral 2002   LYSIS OF ADHESION N/A 11/17/2017   Procedure: LYSIS OF ADHESION;  Surgeon: Excell Seltzer, MD;  Location: [place];  Service: General;  Laterality: N/A;   ROUX-EN-Y GASTRIC BYPASS  AUG 2013   [place];  1994 x2;  02-03-1998   STRABISMUS SURGERY Right    VAGINAL HYSTERECTOMY  2007   fibroids    Family History  Problem Relation Age of Onset   Heart attack Mother    Heart disease Mother    Diabetes Mother    Hypertension Mother    Hyperlipidemia Mother    Stroke Mother    Kidney disease Mother    Thyroid disease Mother    Anxiety disorder Mother    Obesity Mother    Cancer Father        unknown primary   Diabetes Father    Hypertension Father    Hyperlipidemia Father    Heart disease Father    Stroke Father    Kidney disease Father    Obesity Father    Stroke Sister    Diabetes Sister    Diabetes Maternal Aunt    Heart disease Maternal Aunt  Cancer Paternal Aunt    Cancer Paternal Uncle    Colon cancer Neg Hx    Colon polyps Neg Hx     Social History   Tobacco Use   Smoking status: Never   Smokeless tobacco: Never  Vaping Use   Vaping Use: Never used  Substance Use Topics   Alcohol use: No    Alcohol/week: 0.0 standard drinks of alcohol   Drug use: No    Prior to Admission medications   Medication Sig Start Date End Date Taking? Authorizing Provider  busPIRone (BUSPAR) 7.5 MG tablet TAKE 1 TABLET BY MOUTH TWICE DAILY AS NEEDED FOR ANXIETY 01/01/21 01/01/22  [name], MD  Cholecalciferol (VITAMIN D) 50 MCG (2000 UT) CAPS Take 2,000 Units by mouth daily.    [provider]  cyanocobalamin (,VITAMIN B-12,) 1000 MCG/ML injection Inject 1 mL (1,000 mcg total) into the muscle every 30 (thirty) days. 09/04/20   [name], MD   diazepam (VALIUM) 10 MG tablet TAKE 1 TABLET BY MOUTH ONCE DAILY 07/29/21 01/25/22  [name], MD  ferrous sulfate 325 (65 FE) MG tablet Take 325 mg by mouth daily with breakfast.    [provider]  Syringe/Needle, Disp, (SYRINGE 3CC/25GX1") 25G X 1" 3 ML MISC Use once monthly for injection of Vitamin B-12 06/28/21   Marin Olp, MD  traZODone (DESYREL) 100 MG tablet Take 1 to 1 & 1/2 tablets by mouth at bedtime. 11/16/20 11/30/21  [name], MD    Allergies Lactose and Topiramate   REVIEW OF SYSTEMS  Negative except as noted here or in the History of Present Illness.   PHYSICAL EXAMINATION  Initial Vital Signs Blood pressure 137/82, pulse 69, temperature 97.7 F (36.5 C), temperature source Oral, resp. rate 17, height '5\' 1"'$  (1.549 m), weight 55.3 kg, last menstrual period 01/13/2005, SpO2 100 %.  Examination General: Well-developed, well-nourished female in no acute distress; appearance consistent with age of record HENT: normocephalic; atraumatic Eyes: Normal appearance Neck: supple Heart: regular rate and rhythm Lungs: clear to auscultation bilaterally Abdomen: soft; nondistended; mid abdominal tenderness; bowel sounds hypoactive  Extremities: No deformity; full range of motion; pulses normal Neurologic: Awake, alert and oriented; motor function intact in all extremities and symmetric; no facial droop Skin: Warm and dry Psychiatric: Grimacing   RESULTS  Summary of this visit's results, reviewed and interpreted by myself:   EKG Interpretation  Date/Time:    Ventricular Rate:    PR Interval:    QRS Duration:   QT Interval:    QTC Calculation:   R Axis:     Text Interpretation:         Laboratory Studies: Results for orders placed or performed during the hospital encounter of 08/14/21 (from the past 24 hour(s))  Urinalysis, Routine w reflex microscopic Urine, Clean Catch     Status: None   Collection Time: 08/14/21 11:27 PM  Result Value  Ref Range   Color, Urine YELLOW YELLOW   APPearance CLEAR CLEAR   Specific Gravity, Urine 1.025 1.005 - 1.030   pH 5.5 5.0 - 8.0   Glucose, UA NEGATIVE NEGATIVE mg/dL   Hgb urine dipstick NEGATIVE NEGATIVE   Bilirubin Urine NEGATIVE NEGATIVE   Ketones, ur NEGATIVE NEGATIVE mg/dL   Protein, ur NEGATIVE NEGATIVE mg/dL   Nitrite NEGATIVE NEGATIVE   Leukocytes,Ua NEGATIVE NEGATIVE  CBC with Differential     Status: None   Collection Time: 08/14/21 11:43 PM  Result Value Ref Range  WBC 5.4 4.0 - 10.5 K/uL   RBC 4.37 3.87 - 5.11 MIL/uL   Hemoglobin 12.4 12.0 - 15.0 g/dL   HCT 36.3 36.0 - 46.0 %   MCV 83.1 80.0 - 100.0 fL   MCH 28.4 26.0 - 34.0 pg   MCHC 34.2 30.0 - 36.0 g/dL   RDW 13.2 11.5 - 15.5 %   Platelets 185 150 - 400 K/uL   nRBC 0.0 0.0 - 0.2 %   Neutrophils Relative % 72 %   Neutro Abs 3.9 1.7 - 7.7 K/uL   Lymphocytes Relative 19 %   Lymphs Abs 1.0 0.7 - 4.0 K/uL   Monocytes Relative 7 %   Monocytes Absolute 0.4 0.1 - 1.0 K/uL   Eosinophils Relative 1 %   Eosinophils Absolute 0.1 0.0 - 0.5 K/uL   Basophils Relative 1 %   Basophils Absolute 0.0 0.0 - 0.1 K/uL   Immature Granulocytes 0 %   Abs Immature Granulocytes 0.02 0.00 - 0.07 K/uL  Comprehensive metabolic panel     Status: Abnormal   Collection Time: 08/14/21 11:43 PM  Result Value Ref Range   Sodium 138 135 - 145 mmol/L   Potassium 3.6 3.5 - 5.1 mmol/L   Chloride 104 98 - 111 mmol/L   CO2 27 22 - 32 mmol/L   Glucose, Bld 105 (H) 70 - 99 mg/dL   BUN 14 8 - 23 mg/dL   Creatinine, Ser 0.69 0.44 - 1.00 mg/dL   Calcium 8.2 (L) 8.9 - 10.3 mg/dL   Total Protein 7.1 6.5 - 8.1 g/dL   Albumin 3.9 3.5 - 5.0 g/dL   AST 30 15 - 41 U/L   ALT 31 0 - 44 U/L   Alkaline Phosphatase 64 38 - 126 U/L   Total Bilirubin 0.5 0.3 - 1.2 mg/dL   GFR, Estimated >60 >60 mL/min   Anion gap 7 5 - 15  Lipase, blood     Status: None   Collection Time: 08/14/21 11:43 PM  Result Value Ref Range   Lipase 30 11 - 51 U/L   Imaging  Studies: CT ABDOMEN PELVIS W CONTRAST  Result Date: 08/15/2021 CLINICAL DATA:  Generalized abdominal pain and vomiting. EXAM: CT ABDOMEN AND PELVIS WITH CONTRAST TECHNIQUE: Multidetector CT imaging of the abdomen and pelvis was performed using the standard protocol following bolus administration of intravenous contrast. RADIATION DOSE REDUCTION: This exam was performed according to the departmental dose-optimization program which includes automated exposure control, adjustment of the mA and/or kV according to patient size and/or use of iterative reconstruction technique. CONTRAST:  144m OMNIPAQUE IOHEXOL 300 MG/ML  SOLN COMPARISON:  12/13/2018. FINDINGS: Lower chest: No acute abnormality. Hepatobiliary: Scattered hypodensities are present in the liver, the largest measuring 2.7 cm is cystic in attenuation. There is intrahepatic biliary ductal dilatation which is likely related to patient's post cholecystectomy status. The common bile duct is within normal limits. Pancreas: Unremarkable. No pancreatic ductal dilatation or surrounding inflammatory changes. Spleen: Normal in size without focal abnormality. Adrenals/Urinary Tract: No adrenal nodule or mass. Cysts are present in the kidneys bilaterally. The kidneys enhance symmetrically. No renal calculus or hydronephrosis. The bladder is unremarkable. Stomach/Bowel: Gastric bypass surgical changes are noted. Appendix appears normal. There are borderline distended loops of small bowel in the abdomen with bowel wall thickening with no definite transition point. No free air or pneumatosis. A few scattered diverticula are present along the colon without evidence of diverticulitis. Vascular/Lymphatic: Aortic atherosclerosis. No enlarged abdominal or pelvic lymph nodes. Reproductive:  Status post hysterectomy. No adnexal masses. Other: Mesenteric edema is noted and there is a trace amount of free fluid in the pelvis. Small fat containing umbilical hernia. Musculoskeletal:  Degenerative changes are present in the thoracolumbar spine. No acute osseous abnormality. IMPRESSION: 1. Borderline distended loops of small bowel in the abdomen with pronounced bowel wall thickening. Differential diagnosis includes enteritis versus partial or early small bowel obstruction. Short-term follow-up is recommended. 2. Hepatic and renal cysts. 3. Diverticulosis without diverticulitis. 4. Aortic atherosclerosis. Electronically Signed   By: Brett Fairy M.D.   On: 08/15/2021 00:46    ED COURSE and MDM  Nursing notes, initial and subsequent vitals signs, including pulse oximetry, reviewed and interpreted by myself.  Vitals:   08/14/21 2308 08/14/21 2319  BP: 137/82   Pulse: 69   Resp: 17   Temp: 97.7 F (36.5 C)   TempSrc: Oral   SpO2: 100%   Weight: 55.3 kg 55.3 kg  Height: '5\' 1"'$  (1.549 m) '5\' 1"'$  (1.549 m)   Medications  sodium chloride 0.9 % bolus 1,000 mL (1,000 mLs Intravenous New Bag/Given 08/14/21 2339)  ondansetron (ZOFRAN) injection 4 mg (4 mg Intravenous Given 08/14/21 2339)  HYDROmorphone (DILAUDID) injection 1 mg (1 mg Intravenous Given 08/14/21 2341)  iohexol (OMNIPAQUE) 300 MG/ML solution 100 mL (100 mLs Intravenous Contrast Given 08/15/21 0016)   12:49 AM Although the CT scan findings are equivocal for small bowel obstruction the patient's symptoms like previous small bowel obstruction.  We will have the patient admitted.  1:04 AM Dr. Hal Hope accepts for admission to hospitalist service.  PROCEDURES  Procedures   ED DIAGNOSES     ICD-10-CM   1. SBO (small bowel obstruction) ([place])  K56.609          Portia Wisdom, Jenny Reichmann, MD 08/15/21 0104

## 2021-08-15 ENCOUNTER — Other Ambulatory Visit: Payer: Self-pay

## 2021-08-15 ENCOUNTER — Inpatient Hospital Stay (HOSPITAL_COMMUNITY): Payer: No Typology Code available for payment source

## 2021-08-15 ENCOUNTER — Inpatient Hospital Stay: Admit: 2021-08-15 | Payer: No Typology Code available for payment source | Admitting: Internal Medicine

## 2021-08-15 ENCOUNTER — Other Ambulatory Visit (HOSPITAL_COMMUNITY): Payer: Self-pay

## 2021-08-15 ENCOUNTER — Encounter (HOSPITAL_COMMUNITY): Payer: Self-pay

## 2021-08-15 ENCOUNTER — Encounter (HOSPITAL_COMMUNITY): Payer: Self-pay | Admitting: Internal Medicine

## 2021-08-15 ENCOUNTER — Emergency Department (HOSPITAL_BASED_OUTPATIENT_CLINIC_OR_DEPARTMENT_OTHER): Payer: No Typology Code available for payment source

## 2021-08-15 DIAGNOSIS — N281 Cyst of kidney, acquired: Secondary | ICD-10-CM | POA: Diagnosis not present

## 2021-08-15 DIAGNOSIS — K56609 Unspecified intestinal obstruction, unspecified as to partial versus complete obstruction: Secondary | ICD-10-CM | POA: Diagnosis not present

## 2021-08-15 DIAGNOSIS — K5730 Diverticulosis of large intestine without perforation or abscess without bleeding: Secondary | ICD-10-CM | POA: Diagnosis not present

## 2021-08-15 DIAGNOSIS — K5669 Other partial intestinal obstruction: Secondary | ICD-10-CM | POA: Diagnosis not present

## 2021-08-15 DIAGNOSIS — Z66 Do not resuscitate: Secondary | ICD-10-CM | POA: Diagnosis present

## 2021-08-15 DIAGNOSIS — K7689 Other specified diseases of liver: Secondary | ICD-10-CM | POA: Diagnosis not present

## 2021-08-15 LAB — COMPREHENSIVE METABOLIC PANEL
ALT: 31 U/L (ref 0–44)
AST: 30 U/L (ref 15–41)
Albumin: 3.9 g/dL (ref 3.5–5.0)
Alkaline Phosphatase: 64 U/L (ref 38–126)
Anion gap: 7 (ref 5–15)
BUN: 14 mg/dL (ref 8–23)
CO2: 27 mmol/L (ref 22–32)
Calcium: 8.2 mg/dL — ABNORMAL LOW (ref 8.9–10.3)
Chloride: 104 mmol/L (ref 98–111)
Creatinine, Ser: 0.69 mg/dL (ref 0.44–1.00)
GFR, Estimated: >60 mL/min (ref >60)
Glucose, Bld: 105 mg/dL — ABNORMAL HIGH (ref 70–99)
Potassium: 3.6 mmol/L (ref 3.5–5.1)
Sodium: 138 mmol/L (ref 135–145)
Total Bilirubin: 0.5 mg/dL (ref 0.3–1.2)
Total Protein: 7.1 g/dL (ref 6.5–8.1)

## 2021-08-15 LAB — LIPASE, BLOOD: Lipase: 30 U/L (ref 11–51)

## 2021-08-15 MED ORDER — ACETAMINOPHEN 650 MG RE SUPP: 650.0000 mg | Freq: Four times a day (QID) | RECTAL | Status: DC | PRN

## 2021-08-15 MED ORDER — HYDROMORPHONE HCL 1 MG/ML IJ SOLN
0.5000 mg | INTRAMUSCULAR | Status: DC | PRN
  Administered 2021-08-15 – 2021-08-16 (×5): 0.5 mg via INTRAVENOUS
  Filled 2021-08-15: qty 0.5
  Filled 2021-08-15: qty 1
  Filled 2021-08-15 (×3): qty 0.5

## 2021-08-15 MED ORDER — LACTATED RINGERS IV SOLN
INTRAVENOUS | Status: DC
Start: 2021-08-15 — End: 2021-08-16

## 2021-08-15 MED ORDER — ACETAMINOPHEN 325 MG PO TABS
650.0000 mg | ORAL_TABLET | Freq: Four times a day (QID) | ORAL | Status: DC | PRN
  Administered 2021-08-16 (×2): 650 mg via ORAL
  Filled 2021-08-15 (×2): qty 2

## 2021-08-15 MED ORDER — ONDANSETRON 4 MG PO TBDP: 4.0000 mg | ORAL_TABLET | Freq: Four times a day (QID) | ORAL | Status: DC | PRN

## 2021-08-15 MED ORDER — IOHEXOL 300 MG/ML  SOLN
100.0000 mL | Freq: Once | INTRAMUSCULAR | Status: AC | PRN
  Administered 2021-08-15: 100 mL via INTRAVENOUS

## 2021-08-15 MED ORDER — ONDANSETRON HCL 4 MG/2ML IJ SOLN
4.0000 mg | Freq: Four times a day (QID) | INTRAMUSCULAR | Status: DC | PRN
  Administered 2021-08-15 – 2021-08-16 (×2): 4 mg via INTRAVENOUS
  Filled 2021-08-15 (×3): qty 2

## 2021-08-15 MED ORDER — LORAZEPAM 2 MG/ML IJ SOLN
0.5000 mg | INTRAMUSCULAR | Status: DC | PRN
Start: 2021-08-15 — End: 2021-08-16

## 2021-08-15 MED ORDER — HYDRALAZINE HCL 20 MG/ML IJ SOLN: 10.0000 mg | INTRAMUSCULAR | Status: DC | PRN

## 2021-08-15 NOTE — H&P (Signed)
History and Physical    Patient: Denise Rice PFX:[id number] DOB: Dec 26, 1950 DOA: 08/14/2021 DOS: the patient was seen and examined on 08/15/2021 PCP: [name], MD  Patient coming from: Home - lives with husband; NOK: Vira Blanco, [phone]   Chief Complaint: Abdominal pain  HPI: Denise Rice is a 71 y.o. female with medical history significant of PCOS and s/p gastric bypass presenting with abdominal pain and n/v.  She reports remote h/o gastric bypass (2013?) and SBO that required lap adhesion resection in 2019.  She was in her usual state of health when she developed severe acute abdominal pain yesterday.  She drank Pepto and vomited it and continued to have heaves through the night.  No fever.  Normal BM yesterday AM.    ER Course:  [facility] to [facility] transfer, per Dr. Hal Hope:  71 year old female with prior history of gastric bypass presents to the ER with abdominal pain nausea vomiting.  CT scan shows features concerning for small bowel obstruction versus enteritis.  Admitted for further work-up.  May need surgery input based on the assessment.     Review of Systems: As mentioned in the history of present illness. All other systems reviewed and are negative. 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    craniotomy for repair of R ear   Vertigo    Vitamin B12 deficiency 07/29/2018   Started IM injections monthly 06/2018   Vitamin D deficiency    Past  Surgical History:  Procedure Laterality Date   BLEPHAROPLASTY  03-05-2000   CRANIOTOMY  08/2014   to repair dehescence in right ear   HAMMER TOE SURGERY Left 08-29-2013   2nd toe   IMPLANTATION BONE ANCHORED HEARING AID Left 07/27/2008   left temporal bone;now removed   LAPAROSCOPIC CHOLECYSTECTOMY  03-31-2005   LAPAROSCOPIC GASTRIC BANDING  02-08-2007   LAPAROSCOPY N/A 11/17/2017   Procedure: LAPAROSCOPY LYSIS OF ADHESIONS FOR SMALL BOWEL OBSTRUCTION;  Surgeon: Excell Seltzer, MD;  Location: [place];  Service: General;  Laterality: N/A;   LASER ABLATION CONDOLAMATA N/A 07/30/2012   Procedure: LASER ABLATION CONDOLAMATA;  Surgeon: Leighton Ruff, MD;  Location: [place];  Service: General;  Laterality: N/A;   LASIK Bilateral 2002   LYSIS OF ADHESION N/A 11/17/2017   Procedure: LYSIS OF ADHESION;  Surgeon: Excell Seltzer, MD;  Location: [place];  Service: General;  Laterality: N/A;   ROUX-EN-Y GASTRIC BYPASS  AUG 2013   [place];  1994 x2;  02-03-1998   STRABISMUS SURGERY Right    VAGINAL HYSTERECTOMY  2007   fibroids   Social History:  reports that she has never smoked. She has never used smokeless tobacco. She reports that she does not drink alcohol and does not use drugs.  Allergies  Allergen Reactions   Lactose Diarrhea   Topiramate Nausea Only   Alprazolam Nausea Only  Family History  Problem Relation Age of Onset   Heart attack Mother    Heart disease Mother    Diabetes Mother    Hypertension Mother    Hyperlipidemia Mother    Stroke Mother    Kidney disease Mother    Thyroid disease Mother    Anxiety disorder Mother    Obesity Mother    Cancer Father        unknown primary   Diabetes Father    Hypertension Father    Hyperlipidemia Father    Heart disease Father    Stroke Father    Kidney disease Father    Obesity Father    Stroke Sister    Diabetes Sister    Diabetes Maternal Aunt    Heart disease Maternal Aunt     Cancer Paternal Aunt    Cancer Paternal Uncle    Colon cancer Neg Hx    Colon polyps Neg Hx     Prior to Admission medications   Medication Sig Start Date End Date Taking? Authorizing Provider  busPIRone (BUSPAR) 7.5 MG tablet TAKE 1 TABLET BY MOUTH TWICE DAILY AS NEEDED FOR ANXIETY 01/01/21 01/01/22 Yes [name], MD  Cholecalciferol (VITAMIN D) 50 MCG (2000 UT) CAPS Take 2,000 Units by mouth daily.   Yes [provider]  cyanocobalamin (,VITAMIN B-12,) 1000 MCG/ML injection Inject 1 mL (1,000 mcg total) into the muscle every 30 (thirty) days. 09/04/20  Yes [name], MD  diazepam (VALIUM) 10 MG tablet TAKE 1 TABLET BY MOUTH ONCE DAILY 07/29/21 01/25/22 Yes [name], MD  ferrous sulfate 325 (65 FE) MG tablet Take 325 mg by mouth daily with breakfast.   Yes [provider]  Syringe/Needle, Disp, (SYRINGE 3CC/25GX1") 25G X 1" 3 ML MISC Use once monthly for injection of Vitamin B-12 06/28/21  Yes Marin Olp, MD  traZODone (DESYREL) 100 MG tablet Take 1 to 1 & 1/2 tablets by mouth at bedtime. 11/16/20 11/30/21 Yes [name], MD    Physical Exam: Vitals:   08/15/21 0411 08/15/21 0735 08/15/21 1135 08/15/21 1441  BP: (!) 92/59 116/72 (!) 119/104 103/63  Pulse: 62 (!) 55 65 (!) 57  Resp: '18 16 17   '$ Temp: 97.8 F (36.6 C) 97.8 F (36.6 C) 98.7 F (37.1 C) (!) 97.4 F (36.3 C)  TempSrc: Oral Oral Oral Oral  SpO2: 97% 100%  98%  Weight:      Height:       General:  Appears calm and comfortable and is in NAD Eyes:  EOMI, normal lids, iris ENT:   hard of hearing, grossly normal lips & tongue, mmm; appropriate dentition Neck:  no LAD, masses or thyromegaly Cardiovascular:  RRR, no m/r/g. No LE edema.  Respiratory:   CTA bilaterally with no wheezes/rales/rhonchi.  Normal respiratory effort. Abdomen:  soft, no reported pain but marked TTP on exam without apparent peritoneal signs, ND, hypoactive BS with some high-pitched sounds Skin:  no rash or  induration seen on limited exam Musculoskeletal:  grossly normal tone BUE/BLE, good ROM, no bony abnormality Psychiatric:  grossly normal mood and affect, speech fluent and appropriate, AOx3 Neurologic:  CN 2-12 grossly intact, moves all extremities in coordinated fashion   Radiological Exams on Admission: Independently reviewed - see discussion in A/P where applicable  DG Abd Portable 1V  Result Date: 08/15/2021 CLINICAL DATA:  Small bowel obstruction EXAM: PORTABLE ABDOMEN - 1 VIEW COMPARISON:  None Available. FINDINGS: Nonobstructive pattern of bowel gas,  with gas present to the distal colon. Moderate burden of stool throughout the colon. Excreted contrast in the urinary bladder. No free air on single supine radiograph. IMPRESSION: Nonobstructive pattern of bowel gas, with gas present to the distal colon. Moderate burden of stool throughout the colon. Electronically Signed   By: Delanna Ahmadi M.D.   On: 08/15/2021 13:48   CT ABDOMEN PELVIS W CONTRAST  Result Date: 08/15/2021 CLINICAL DATA:  Generalized abdominal pain and vomiting. EXAM: CT ABDOMEN AND PELVIS WITH CONTRAST TECHNIQUE: Multidetector CT imaging of the abdomen and pelvis was performed using the standard protocol following bolus administration of intravenous contrast. RADIATION DOSE REDUCTION: This exam was performed according to the departmental dose-optimization program which includes automated exposure control, adjustment of the mA and/or kV according to patient size and/or use of iterative reconstruction technique. CONTRAST:  170m OMNIPAQUE IOHEXOL 300 MG/ML  SOLN COMPARISON:  12/13/2018. FINDINGS: Lower chest: No acute abnormality. Hepatobiliary: Scattered hypodensities are present in the liver, the largest measuring 2.7 cm is cystic in attenuation. There is intrahepatic biliary ductal dilatation which is likely related to patient's post cholecystectomy status. The common bile duct is within normal limits. Pancreas: Unremarkable. No  pancreatic ductal dilatation or surrounding inflammatory changes. Spleen: Normal in size without focal abnormality. Adrenals/Urinary Tract: No adrenal nodule or mass. Cysts are present in the kidneys bilaterally. The kidneys enhance symmetrically. No renal calculus or hydronephrosis. The bladder is unremarkable. Stomach/Bowel: Gastric bypass surgical changes are noted. Appendix appears normal. There are borderline distended loops of small bowel in the abdomen with bowel wall thickening with no definite transition point. No free air or pneumatosis. A few scattered diverticula are present along the colon without evidence of diverticulitis. Vascular/Lymphatic: Aortic atherosclerosis. No enlarged abdominal or pelvic lymph nodes. Reproductive: Status post hysterectomy. No adnexal masses. Other: Mesenteric edema is noted and there is a trace amount of free fluid in the pelvis. Small fat containing umbilical hernia. Musculoskeletal: Degenerative changes are present in the thoracolumbar spine. No acute osseous abnormality. IMPRESSION: 1. Borderline distended loops of small bowel in the abdomen with pronounced bowel wall thickening. Differential diagnosis includes enteritis versus partial or early small bowel obstruction. Short-term follow-up is recommended. 2. Hepatic and renal cysts. 3. Diverticulosis without diverticulitis. 4. Aortic atherosclerosis. Electronically Signed   By: LBrett FairyM.D.   On: 08/15/2021 00:46    EKG:  None   Labs on Admission: I have personally reviewed the available labs and imaging studies at the time of the admission.  Pertinent labs:    Glucose 105 Normal CBC UA WNL   Assessment and Plan: Principal Problem:   SBO (small bowel obstruction) (HCC) Active Problems:   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   Anxiety   DNR (do not resuscitate)    SBO -Patient with prior h/o abdominal surgeries presenting with acute onset of abdominal pain with n/v, CT findings c/w SBO -Enteritis was  also a consideration but her history is less suggestive of infectious etiology than obstruction -Will admit to Med Surg -Gen Surg consulted; currently no indication for surgical intervention -80% of SBO will resolve without surgery -High-grade SBO can usually be safely managed non-operatively -If PO contrast reaches colon within 24 hours, SBO will very likely certainly resolve without surgery -NPO for bowel rest -NG tube does not appear to be indicated at this time -IVF hydration -Pain control with low-dose dilaudid -Current guidelines recommend that patients without resolution undergo surgery by 3-5 days  Anxiety -Patient takes buspirone, diazepam as needed and  nightly trazodone -For now, will give prn IV Ativan  DNR -I have discussed code status with the patient and she would prefer to die a natural death should that situation arise. -She will need a gold out of facility DNR form at the time of discharge      Advance Care Planning:   Code Status: DNR   Consults: Surgery  DVT Prophylaxis: SCDs  Family Communication: None present; she declined to have me call her husband at the time of admission  Severity of Illness: The appropriate patient status for this patient is INPATIENT. Inpatient status is judged to be reasonable and necessary in order to provide the required intensity of service to ensure the patient's safety. The patient's presenting symptoms, physical exam findings, and initial radiographic and laboratory data in the context of their chronic comorbidities is felt to place them at high risk for further clinical deterioration. Furthermore, it is not anticipated that the patient will be medically stable for discharge from the hospital within 2 midnights of admission.   * I certify that at the point of admission it is my clinical judgment that the patient will require inpatient hospital care spanning beyond 2 midnights from the point of admission due to high intensity of  service, high risk for further deterioration and high frequency of surveillance required.*  Author: Karmen Bongo, MD 08/15/2021 3:54 PM  For on call review [email or web address].

## 2021-08-15 NOTE — Progress Notes (Signed)
Pt admitted for SBO. A&OX4 BP 125/79 (BP Location: Left Arm)   Pulse 66   Temp (!) 97.5 F (36.4 C) (Oral)   Resp 17   Ht '5\' 1"'$  (1.549 m)   Wt 55.3 kg   LMP 01/13/2005   SpO2 99%   BMI 23.05 kg/m  Pt had no pain at this time. Skin clean dry and intact. Pt ambulates without assistance. Pt is HOH. Not on tele at this time or any fluids. Awaiting full set of orders. PT oriented to room call bell nearby, bed in lost position, brakes on and bed alarm on. No other needs voiced at this time. Louanne Skye 08/15/21 3:16 AM

## 2021-08-15 NOTE — Consult Note (Signed)
Denise Rice [birth year]  [id number].    Requesting MD: Dr. Karmen Bongo Chief Complaint/Reason for Consult: SBO  HPI: Denise Rice is a 71 y.o. female who presented to the ED on 8/2 for abdominal pain.  Patient reports yesterday evening around 8:30 PM she began having diffuse lower abdominal pain with radiation to her back with associated nausea and vomiting.  Pain felt similar to prior SBO.  Her work-up was concerning for SBO.  She was transferred to [facility] for [facility] admission and we were asked to see.   Patient has a history of prior gastric bypass by Dr. Jenna Luo in 2013. She required dx laparoscopy, loa by Dr. Excell Seltzer in 2019 for SBO.  She does take a daily multivitamin.  Denies alcohol, NSAID or steroid use.  Additionally she has had a vaginal hysterectomy, and laparoscopic cholecystectomy.  She is not on any blood thinners.  Notes that her abdominal pain, nausea, vomiting have resolved.  She is passing flatus.  Last BM was yesterday morning and normal.  ROS: ROS See HPI, as above.   Family History  Problem Relation Age of Onset   Heart attack Mother    Heart disease Mother    Diabetes Mother    Hypertension Mother    Hyperlipidemia Mother    Stroke Mother    Kidney disease Mother    Thyroid disease Mother    Anxiety disorder Mother    Obesity Mother    Cancer Father        unknown primary   Diabetes Father    Hypertension Father    Hyperlipidemia Father    Heart disease Father    Stroke Father    Kidney disease Father    Obesity Father    Stroke Sister    Diabetes Sister    Diabetes Maternal Aunt    Heart disease Maternal Aunt    Cancer Paternal Aunt    Cancer Paternal Uncle    Colon cancer Neg Hx    Colon polyps Neg Hx     Past Medical History:  Diagnosis Date   Anal condyloma    Anxiety    Constipation    Deafness in left ear    Depression    Fatigue    Gallbladder problem    Hearing loss, sensorineural, high frequency    RIGHT EAR   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    Vertigo    Vitamin B12 deficiency 07/29/2018   Started IM injections monthly 06/2018   Vitamin D deficiency     Past Surgical History:  Procedure Laterality Date   BLEPHAROPLASTY  03-05-2000   CRANIOTOMY  08/2014   to repair dehescence in right ear   HAMMER TOE SURGERY Left 08-29-2013   2nd toe   IMPLANTATION BONE ANCHORED HEARING AID Left 07/27/2008   left temporal bone;now removed   LAPAROSCOPIC CHOLECYSTECTOMY  03-31-2005   LAPAROSCOPIC GASTRIC BANDING  02-08-2007   LAPAROSCOPY N/A 11/17/2017   Procedure: LAPAROSCOPY LYSIS OF ADHESIONS FOR SMALL BOWEL OBSTRUCTION;  Surgeon: Excell Seltzer, MD;  Location: [place];  Service: General;  Laterality:  N/A;   LASER ABLATION CONDOLAMATA N/A 07/30/2012   Procedure: LASER ABLATION CONDOLAMATA;  Surgeon: Leighton Ruff, MD;  Location: [facility];  Service: General;  Laterality: N/A;   LASIK Bilateral 2002   LYSIS OF ADHESION N/A 11/17/2017   Procedure: LYSIS OF ADHESION;  Surgeon: Excell Seltzer, MD;  Location: [place];  Service: General;  Laterality: N/A;   ROUX-EN-Y GASTRIC BYPASS  AUG 2013   [place];  1994 x2;  02-03-1998   STRABISMUS SURGERY Right    VAGINAL HYSTERECTOMY  2007   fibroids    Social History:  reports that she has never smoked. She has never used smokeless tobacco. She reports that she does not drink alcohol and does not use drugs.  Allergies:  Allergies  Allergen Reactions   Lactose Diarrhea   Topiramate Nausea Only   Alprazolam Nausea Only    Medications Prior to Admission  Medication Sig Dispense  Refill   acetaminophen (TYLENOL) 325 MG tablet Take 325 mg by mouth daily as needed for headache.     busPIRone (BUSPAR) 7.5 MG tablet TAKE 1 TABLET BY MOUTH TWICE DAILY AS NEEDED FOR ANXIETY (Patient taking differently: Take 7.5 mg by mouth daily as needed (anxiety).) 90 tablet 4   Cholecalciferol (VITAMIN D) 50 MCG (2000 UT) CAPS Take 2,000 Units by mouth daily.     cyanocobalamin (,VITAMIN B-12,) 1000 MCG/ML injection Inject 1 mL (1,000 mcg total) into the muscle every 30 (thirty) days. 10 mL 1   diazepam (VALIUM) 10 MG tablet TAKE 1 TABLET BY MOUTH ONCE DAILY (Patient taking differently: Take 10 mg by mouth daily as needed for anxiety.) 30 tablet 5   ferrous sulfate 325 (65 FE) MG tablet Take 325 mg by mouth daily with breakfast.     Potassium (POTASSIMIN PO) Take 1 tablet by mouth daily.     traZODone (DESYREL) 100 MG tablet Take 1 to 1 & 1/2 tablets by mouth at bedtime. (Patient taking differently: Take 100 mg by mouth at bedtime.) 135 tablet 3   Syringe/Needle, Disp, (SYRINGE 3CC/25GX1") 25G X 1" 3 ML MISC Use once monthly for injection of Vitamin B-12 50 each 0     Physical Exam: Blood pressure (!) 119/104, pulse 65, temperature 98.7 F (37.1 C), temperature source Oral, resp. rate 17, height '5\' 1"'$  (1.549 m), weight 55.3 kg, last menstrual period 01/13/2005, SpO2 100 %. General: pleasant, WD/WN white female who is laying in bed in NAD HEENT: head is normocephalic, atraumatic.  Sclera are noninjected.  PERRL.  Ears and nose without any masses or lesions.  Mouth is pink and moist. Dentition fair Heart: regular, rate, and rhythm Lungs: CTAB, no wheezes, rhonchi, or rales noted.  Respiratory effort nonlabored Abd:  Soft, mild distension, central abdominal tenderness, +BS MS: no BUE/BLE edema Skin: warm and dry with no masses, lesions, or rashes Psych: A&Ox4 with an appropriate affect Neuro: cranial nerves grossly intact, normal speech, thought process intact, moves all extremities, gait  not assessed  Results for orders placed or performed during the hospital encounter of 08/14/21 (from the past 48 hour(s))  Urinalysis, Routine w reflex microscopic Urine, Clean Catch     Status: None   Collection Time: 08/14/21 11:27 PM  Result Value Ref Range   Color, Urine YELLOW YELLOW   APPearance CLEAR CLEAR   Specific Gravity, Urine 1.025 1.005 - 1.030   pH 5.5 5.0 - 8.0   Glucose, UA NEGATIVE NEGATIVE mg/dL   Hgb urine dipstick NEGATIVE NEGATIVE  Bilirubin Urine NEGATIVE NEGATIVE   Ketones, ur NEGATIVE NEGATIVE mg/dL   Protein, ur NEGATIVE NEGATIVE mg/dL   Nitrite NEGATIVE NEGATIVE   Leukocytes,Ua NEGATIVE NEGATIVE    Comment: Microscopic not done on urines with negative protein, blood, leukocytes, nitrite, or glucose < 500 mg/dL. Performed at [facility], [address] [place], [place] [address]   CBC with Differential     Status: None   Collection Time: 08/14/21 11:43 PM  Result Value Ref Range   WBC 5.4 4.0 - 10.5 K/uL   RBC 4.37 3.87 - 5.11 MIL/uL   Hemoglobin 12.4 12.0 - 15.0 g/dL   HCT 36.3 36.0 - 46.0 %   MCV 83.1 80.0 - 100.0 fL   MCH 28.4 26.0 - 34.0 pg   MCHC 34.2 30.0 - 36.0 g/dL   RDW 13.2 11.5 - 15.5 %   Platelets 185 150 - 400 K/uL   nRBC 0.0 0.0 - 0.2 %   Neutrophils Relative % 72 %   Neutro Abs 3.9 1.7 - 7.7 K/uL   Lymphocytes Relative 19 %   Lymphs Abs 1.0 0.7 - 4.0 K/uL   Monocytes Relative 7 %   Monocytes Absolute 0.4 0.1 - 1.0 K/uL   Eosinophils Relative 1 %   Eosinophils Absolute 0.1 0.0 - 0.5 K/uL   Basophils Relative 1 %   Basophils Absolute 0.0 0.0 - 0.1 K/uL   Immature Granulocytes 0 %   Abs Immature Granulocytes 0.02 0.00 - 0.07 K/uL    Comment: Performed at [facility], [address] [place], [place] [address]  Comprehensive metabolic panel     Status: Abnormal   Collection Time: 08/14/21 11:43 PM  Result Value Ref Range   Sodium 138 135 - 145 mmol/L   Potassium 3.6 3.5 - 5.1 mmol/L   Chloride 104 98 -  111 mmol/L   CO2 27 22 - 32 mmol/L   Glucose, Bld 105 (H) 70 - 99 mg/dL    Comment: Glucose reference range applies only to samples taken after fasting for at least 8 hours.   BUN 14 8 - 23 mg/dL   Creatinine, Ser 0.69 0.44 - 1.00 mg/dL   Calcium 8.2 (L) 8.9 - 10.3 mg/dL   Total Protein 7.1 6.5 - 8.1 g/dL   Albumin 3.9 3.5 - 5.0 g/dL   AST 30 15 - 41 U/L   ALT 31 0 - 44 U/L   Alkaline Phosphatase 64 38 - 126 U/L   Total Bilirubin 0.5 0.3 - 1.2 mg/dL   GFR, Estimated >60 >60 mL/min    Comment: (NOTE) Calculated using the CKD-EPI Creatinine Equation (2021)    Anion gap 7 5 - 15    Comment: Performed at [facility], [address] [place], [place] [address]  Lipase, blood     Status: None   Collection Time: 08/14/21 11:43 PM  Result Value Ref Range   Lipase 30 11 - 51 U/L    Comment: Performed at [facility], [address] [place], [place] [address]   CT ABDOMEN PELVIS W CONTRAST  Result Date: 08/15/2021 CLINICAL DATA:  Generalized abdominal pain and vomiting. EXAM: CT ABDOMEN AND PELVIS WITH CONTRAST TECHNIQUE: Multidetector CT imaging of the abdomen and pelvis was performed using the standard protocol following bolus administration of intravenous contrast. RADIATION DOSE REDUCTION: This exam was performed according to the departmental dose-optimization program which includes automated exposure control, adjustment of the mA and/or kV  according to patient size and/or use of iterative reconstruction technique. CONTRAST:  177m OMNIPAQUE IOHEXOL 300 MG/ML  SOLN COMPARISON:  12/13/2018. FINDINGS: Lower chest: No acute abnormality. Hepatobiliary: Scattered hypodensities are present in the liver, the largest measuring 2.7 cm is cystic in attenuation. There is intrahepatic biliary ductal dilatation which is likely related to patient's post cholecystectomy status. The common bile duct is within normal limits. Pancreas: Unremarkable. No pancreatic ductal dilatation or  surrounding inflammatory changes. Spleen: Normal in size without focal abnormality. Adrenals/Urinary Tract: No adrenal nodule or mass. Cysts are present in the kidneys bilaterally. The kidneys enhance symmetrically. No renal calculus or hydronephrosis. The bladder is unremarkable. Stomach/Bowel: Gastric bypass surgical changes are noted. Appendix appears normal. There are borderline distended loops of small bowel in the abdomen with bowel wall thickening with no definite transition point. No free air or pneumatosis. A few scattered diverticula are present along the colon without evidence of diverticulitis. Vascular/Lymphatic: Aortic atherosclerosis. No enlarged abdominal or pelvic lymph nodes. Reproductive: Status post hysterectomy. No adnexal masses. Other: Mesenteric edema is noted and there is a trace amount of free fluid in the pelvis. Small fat containing umbilical hernia. Musculoskeletal: Degenerative changes are present in the thoracolumbar spine. No acute osseous abnormality. IMPRESSION: 1. Borderline distended loops of small bowel in the abdomen with pronounced bowel wall thickening. Differential diagnosis includes enteritis versus partial or early small bowel obstruction. Short-term follow-up is recommended. 2. Hepatic and renal cysts. 3. Diverticulosis without diverticulitis. 4. Aortic atherosclerosis. Electronically Signed   By: LBrett FairyM.D.   On: 08/15/2021 00:46    Anti-infectives (From admission, onward)    None       Assessment/Plan SBO Patient has been seen and examined. Vitals, labs, I/O, imaging and available notes reviewed. This is a 71y.o. female with a hx of prior gastric bypass in 2013 who presented with abdominal pain, n/v. CT scan showed possible partial or early sbo, no free air or pneumatosis. She had one soft BP around 4am of 92/59 but is currently she is HDS without fever, tachycardia or hypotension (actually hypertensive). She notes her abdominal pain has resolved at  rest as well as n/v.  she is passing flatus. On exam her abdomen is soft but she does still have some central abdominal ttp. During her last surgery in 2019 by Dr. HExcell Seltzerhe noted the JJ mesentery was closed with no mesenteric defect and there was no peterson defect. I reviewed the CT images personally and with my attending. I do not think she needs emergency surgery at this time. Will start with xray to see if any of the po contrast has moved to her colon. If it has, we will start cld. If unable to see the contrast, will start PO SBO protocol. Hopefully will be able to improve without requiring surgery during admission. We will follow with you.   FEN - NPO for now. IVF VTE - SCDs, okay for chemical ppx from our standpoint ID - None indicated from our standpoint  MJillyn Ledger [facility] 08/15/2021, 1:21 PM Please see Amion for pager number during day hours 7:00am-4:30pm

## 2021-08-16 ENCOUNTER — Other Ambulatory Visit (HOSPITAL_COMMUNITY): Payer: Self-pay

## 2021-08-16 DIAGNOSIS — Z9884 Bariatric surgery status: Secondary | ICD-10-CM

## 2021-08-16 DIAGNOSIS — K529 Noninfective gastroenteritis and colitis, unspecified: Secondary | ICD-10-CM | POA: Diagnosis not present

## 2021-08-16 DIAGNOSIS — K56609 Unspecified intestinal obstruction, unspecified as to partial versus complete obstruction: Secondary | ICD-10-CM | POA: Diagnosis not present

## 2021-08-16 DIAGNOSIS — Z66 Do not resuscitate: Secondary | ICD-10-CM | POA: Diagnosis not present

## 2021-08-16 DIAGNOSIS — F32A Depression, unspecified: Secondary | ICD-10-CM | POA: Diagnosis not present

## 2021-08-16 DIAGNOSIS — F419 Anxiety disorder, unspecified: Secondary | ICD-10-CM | POA: Diagnosis not present

## 2021-08-16 LAB — CBC
HCT: 31.4 % — ABNORMAL LOW (ref 36.0–46.0)
Hemoglobin: 10.7 g/dL — ABNORMAL LOW (ref 12.0–15.0)
MCH: 28.3 pg (ref 26.0–34.0)
MCHC: 34.1 g/dL (ref 30.0–36.0)
MCV: 83.1 fL (ref 80.0–100.0)
Platelets: 144 10*3/uL — ABNORMAL LOW (ref 150–400)
RBC: 3.78 MIL/uL — ABNORMAL LOW (ref 3.87–5.11)
RDW: 13.2 % (ref 11.5–15.5)
WBC: 5.7 10*3/uL (ref 4.0–10.5)
nRBC: 0 % (ref 0.0–0.2)

## 2021-08-16 LAB — BASIC METABOLIC PANEL
Anion gap: 4 — ABNORMAL LOW (ref 5–15)
BUN: 7 mg/dL — ABNORMAL LOW (ref 8–23)
CO2: 29 mmol/L (ref 22–32)
Calcium: 8 mg/dL — ABNORMAL LOW (ref 8.9–10.3)
Chloride: 107 mmol/L (ref 98–111)
Creatinine, Ser: 0.74 mg/dL (ref 0.44–1.00)
GFR, Estimated: >60 mL/min (ref >60)
Glucose, Bld: 96 mg/dL (ref 70–99)
Potassium: 4.1 mmol/L (ref 3.5–5.1)
Sodium: 140 mmol/L (ref 135–145)

## 2021-08-16 LAB — HIV ANTIBODY (ROUTINE TESTING W REFLEX): HIV Screen 4th Generation wRfx: NONREACTIVE

## 2021-08-16 MED ORDER — CIPROFLOXACIN HCL 500 MG PO TABS
500.0000 mg | ORAL_TABLET | Freq: Two times a day (BID) | ORAL | Status: DC
Start: 2021-08-16 — End: 2021-08-16
  Administered 2021-08-16: 500 mg via ORAL
  Filled 2021-08-16: qty 1

## 2021-08-16 MED ORDER — CIPROFLOXACIN HCL 500 MG PO TABS
500.0000 mg | ORAL_TABLET | Freq: Two times a day (BID) | ORAL | 0 refills | Status: AC
  Filled 2021-08-16: qty 6, 3d supply, fill #0

## 2021-08-16 MED ORDER — ONDANSETRON HCL 4 MG PO TABS
4.0000 mg | ORAL_TABLET | Freq: Every day | ORAL | 0 refills | Status: DC | PRN
  Filled 2021-08-16: qty 20, 20d supply, fill #0

## 2021-08-16 NOTE — Discharge Summary (Signed)
Physician Discharge Summary  Denise Rice PQZ:[id number] DOB: 02/04/1950 DOA: 08/14/2021  PCP: [name], MD  Admit date: 08/14/2021 Discharge date: 08/16/2021 Admitted From: Home Disposition: Home Recommendations for Outpatient Follow-up:  Follow up with PCP in 1 week Check CBC and CMP in 1 week Please follow up on the following pending results: None  Home Health: Not indicated Equipment/Devices: Not indicated  Discharge Condition: Stable CODE STATUS: DNR/DNI  Follow-up Information     [name], MD. Schedule an appointment as soon as possible for a visit in 1 week(s).   Specialty: Family Medicine Contact information: [address] [place] [address] [phone]                 Hospital course 71 year old F with history of gastric bypass, prior SBO, anxiety, depression and PCOS presenting with acute nausea, vomiting and abdominal pain, and admitted for small bowel obstruction versus enteritis.  CT abdomen and pelvis showed borderline distended loops of small bowel in the abdomen with prominence of bowel wall thickening concerning for early SBO and a/or enteritis.  General surgery consulted.  KUB with nonobstructive pattern of bowel gas with moderate burden of stool throughout the colon.  Eventually, patient had bowel movements.  Nausea and vomiting improved.  She tolerated soft diet.  She was cleared for discharge by gastroenterology.  Given concern for enteritis, she was started on Cipro 500 mg twice daily for 3 days.  See individual problem list below for more.   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  Tolerated soft diet.  Had bowel movements.  Possible enteritis: -Discharged on p.o. Cipro 500 mg twice daily for  3 days  Normocytic anemia/thrombocytopenia: Likely dilutional from IV fluids. -Check CBC in 1 week  Other chronic conditions stable.  Continue home meds.           Vital signs Vitals:   08/15/21 1135 08/15/21 1441 08/15/21 2127 08/16/21 0547  BP: (!) 119/104 103/63 119/79 125/79  Pulse: 65 (!) 57 61 (!) 48  Temp: 98.7 F (37.1 C) (!) 97.4 F (36.3 C) 99.1 F (37.3 C) 98.6 F (37 C)  Resp: '17  18 16  '$ Height:      Weight:      SpO2:  98% 97% 99%  TempSrc: Oral Oral Oral Oral  BMI (Calculated):         Discharge exam  GENERAL: No apparent distress.  Nontoxic. HEENT: MMM.  Vision and hearing grossly intact.  NECK: Supple.  No apparent JVD.  RESP:  No IWOB.  Fair aeration bilaterally. CVS:  RRR. Heart sounds normal.  ABD/GI/GU: BS+. Abd soft.  Mild diffuse tenderness. MSK/EXT:  Moves extremities. No apparent deformity. No edema.  SKIN: no apparent skin lesion or wound NEURO: Awake and alert. Oriented appropriately.  No apparent focal neuro deficit. PSYCH: Calm. Normal affect.   Discharge Instructions Discharge Instructions     Call MD for:  extreme fatigue   Complete by: As directed    Call MD for:  persistant dizziness or light-headedness   Complete by: As directed    Call MD for:  persistant nausea and vomiting   Complete by: As directed    Call MD for:  severe uncontrolled pain   Complete by: As directed  Call MD for:  temperature >100.4   Complete by: As directed    Diet general   Complete by: As directed    Soft diet over the next 2 to 3 days.   Discharge instructions   Complete by: As directed    It has been a pleasure taking care of you!  You were hospitalized for small bowel obstruction that seems to have resolved.  There was also concern about bowel infection/inflammation for which you have been started on antibiotics.  Take your medications as prescribed.  Continue soft diet over the next 2 to 3 days.  Follow-up with your primary care doctor in 1  to 2 weeks or sooner if needed.  Keep yourself hydrated.   Take care,   Increase activity slowly   Complete by: As directed       Allergies as of 08/16/2021       Reactions   Lactose Diarrhea   Topiramate Nausea Only   Alprazolam Nausea Only        Medication List     TAKE these medications    acetaminophen 325 MG tablet Commonly known as: TYLENOL Take 325 mg by mouth daily as needed for headache.   B-D 3CC LUER-LOK SYR 25GX1" 25G X 1" 3 ML Misc Generic drug: SYRINGE-NEEDLE (DISP) 3 ML Use once monthly for injection of Vitamin B-12   busPIRone 7.5 MG tablet Commonly known as: BUSPAR TAKE 1 TABLET BY MOUTH TWICE DAILY AS NEEDED FOR ANXIETY What changed:  how much to take when to take this reasons to take this   ciprofloxacin 500 MG tablet Commonly known as: CIPRO Take 1 tablet (500 mg total) by mouth 2 (two) times daily for 3 days.   diazepam 10 MG tablet Commonly known as: VALIUM TAKE 1 TABLET BY MOUTH ONCE DAILY What changed:  how much to take when to take this reasons to take this   Dodex 1000 MCG/ML injection Generic drug: cyanocobalamin Inject 1 mL (1,000 mcg total) into the muscle every 30 (thirty) days.   ferrous sulfate 325 (65 FE) MG tablet Take 325 mg by mouth daily with breakfast.   ondansetron 4 MG tablet Commonly known as: Zofran Take 1 tablet (4 mg total) by mouth daily as needed for nausea or vomiting.   POTASSIMIN PO Take 1 tablet by mouth daily.   traZODone 100 MG tablet Commonly known as: DESYREL Take 1 to 1 & 1/2 tablets by mouth at bedtime. What changed: how much to take   Vitamin D 50 MCG (2000 UT) Caps Take 2,000 Units by mouth daily.        Consultations: General surgery  Procedures/Studies:   DG Abd Portable 1V  Result Date: 08/15/2021 CLINICAL DATA:  Small bowel obstruction EXAM: PORTABLE ABDOMEN - 1 VIEW COMPARISON:  None Available. FINDINGS: Nonobstructive pattern of bowel gas, with gas present to the distal  colon. Moderate burden of stool throughout the colon. Excreted contrast in the urinary bladder. No free air on single supine radiograph. IMPRESSION: Nonobstructive pattern of bowel gas, with gas present to the distal colon. Moderate burden of stool throughout the colon. Electronically Signed   By: Delanna Ahmadi M.D.   On: 08/15/2021 13:48   CT ABDOMEN PELVIS W CONTRAST  Result Date: 08/15/2021 CLINICAL DATA:  Generalized abdominal pain and vomiting. EXAM: CT ABDOMEN AND PELVIS WITH CONTRAST TECHNIQUE: Multidetector CT imaging of the abdomen and pelvis was performed using the standard protocol following bolus administration of intravenous contrast. RADIATION DOSE REDUCTION: This  exam was performed according to the departmental dose-optimization program which includes automated exposure control, adjustment of the mA and/or kV according to patient size and/or use of iterative reconstruction technique. CONTRAST:  178m OMNIPAQUE IOHEXOL 300 MG/ML  SOLN COMPARISON:  12/13/2018. FINDINGS: Lower chest: No acute abnormality. Hepatobiliary: Scattered hypodensities are present in the liver, the largest measuring 2.7 cm is cystic in attenuation. There is intrahepatic biliary ductal dilatation which is likely related to patient's post cholecystectomy status. The common bile duct is within normal limits. Pancreas: Unremarkable. No pancreatic ductal dilatation or surrounding inflammatory changes. Spleen: Normal in size without focal abnormality. Adrenals/Urinary Tract: No adrenal nodule or mass. Cysts are present in the kidneys bilaterally. The kidneys enhance symmetrically. No renal calculus or hydronephrosis. The bladder is unremarkable. Stomach/Bowel: Gastric bypass surgical changes are noted. Appendix appears normal. There are borderline distended loops of small bowel in the abdomen with bowel wall thickening with no definite transition point. No free air or pneumatosis. A few scattered diverticula are present along the  colon without evidence of diverticulitis. Vascular/Lymphatic: Aortic atherosclerosis. No enlarged abdominal or pelvic lymph nodes. Reproductive: Status post hysterectomy. No adnexal masses. Other: Mesenteric edema is noted and there is a trace amount of free fluid in the pelvis. Small fat containing umbilical hernia. Musculoskeletal: Degenerative changes are present in the thoracolumbar spine. No acute osseous abnormality. IMPRESSION: 1. Borderline distended loops of small bowel in the abdomen with pronounced bowel wall thickening. Differential diagnosis includes enteritis versus partial or early small bowel obstruction. Short-term follow-up is recommended. 2. Hepatic and renal cysts. 3. Diverticulosis without diverticulitis. 4. Aortic atherosclerosis. Electronically Signed   By: LBrett FairyM.D.   On: 08/15/2021 00:46       The results of significant diagnostics from this hospitalization (including imaging, microbiology, ancillary and laboratory) are listed below for reference.     Microbiology: No results found for this or any previous visit (from the past 240 hour(s)).   Labs:  CBC: Recent Labs  Lab 08/14/21 2343 08/16/21 0146  WBC 5.4 5.7  NEUTROABS 3.9  --   HGB 12.4 10.7*  HCT 36.3 31.4*  MCV 83.1 83.1  PLT 185 144*   BMP &GFR Recent Labs  Lab 08/14/21 2343 08/16/21 0146  NA 138 140  K 3.6 4.1  CL 104 107  CO2 27 29  GLUCOSE 105* 96  BUN 14 7*  CREATININE 0.69 0.74  CALCIUM 8.2* 8.0*   Estimated Creatinine Clearance: 50.8 mL/min (by C-G formula based on SCr of 0.74 mg/dL). Liver & Pancreas: Recent Labs  Lab 08/14/21 2343  AST 30  ALT 31  ALKPHOS 64  BILITOT 0.5  PROT 7.1  ALBUMIN 3.9   Recent Labs  Lab 08/14/21 2343  LIPASE 30   No results for input(s): "AMMONIA" in the last 168 hours. Diabetic: No results for input(s): "HGBA1C" in the last 72 hours. No results for input(s): "GLUCAP" in the last 168 hours. Cardiac Enzymes: No results for input(s):  "CKTOTAL", "CKMB", "CKMBINDEX", "TROPONINI" in the last 168 hours. No results for input(s): "PROBNP" in the last 8760 hours. Coagulation Profile: No results for input(s): "INR", "PROTIME" in the last 168 hours. Thyroid Function Tests: No results for input(s): "TSH", "T4TOTAL", "FREET4", "T3FREE", "THYROIDAB" in the last 72 hours. Lipid Profile: No results for input(s): "CHOL", "HDL", "LDLCALC", "TRIG", "CHOLHDL", "LDLDIRECT" in the last 72 hours. Anemia Panel: No results for input(s): "VITAMINB12", "FOLATE", "FERRITIN", "TIBC", "IRON", "RETICCTPCT" in the last 72 hours. Urine analysis:    Component  Value Date/Time   COLORURINE YELLOW 08/14/2021 [place] 08/14/2021 2327   LABSPEC 1.025 08/14/2021 2327   PHURINE 5.5 08/14/2021 2327   GLUCOSEU NEGATIVE 08/14/2021 2327   HGBUR NEGATIVE 08/14/2021 2327   BILIRUBINUR NEGATIVE 08/14/2021 2327   BILIRUBINUR n 02/25/2018 [place] 08/14/2021 2327   PROTEINUR NEGATIVE 08/14/2021 2327   UROBILINOGEN 0.2 02/25/2018 1134   UROBILINOGEN 0.2 07/25/2008 1426   NITRITE NEGATIVE 08/14/2021 2327   LEUKOCYTESUR NEGATIVE 08/14/2021 2327   Sepsis Labs: Invalid input(s): "PROCALCITONIN", "LACTICIDVEN"   SIGNED:  Mercy Riding, MD  [facility] 08/16/2021, 4:59 PM

## 2021-08-16 NOTE — Progress Notes (Signed)
Subjective: CC: Required pain medication last night. Abdominal pain has resolved this am and she has not required any further IV pain medication. Tolerating liquid diet without n/v. Formed bm early this am followed by 2 liquid bm's a few hours later.   Objective: Vital signs in last 24 hours: Temp:  [97.4 F (36.3 C)-99.1 F (37.3 C)] 98.6 F (37 C) (08/04 0547) Pulse Rate:  [48-65] 48 (08/04 0547) Resp:  [16-18] 16 (08/04 0547) BP: (103-125)/(63-104) 125/79 (08/04 0547) SpO2:  [97 %-99 %] 99 % (08/04 0547) Last BM Date : 08/14/21  Intake/Output from previous day: 08/03 0701 - 08/04 0700 In: 742.2 [I.V.:742.2] Out: -  Intake/Output this shift: No intake/output data recorded.  PE: Gen:  Alert, NAD, pleasant Abd: Soft, ND, exam improved with mild ttp over the central abdomen, no rigidity or guarding, +BS  Lab Results:  Recent Labs    08/14/21 2343 08/16/21 0146  WBC 5.4 5.7  HGB 12.4 10.7*  HCT 36.3 31.4*  PLT 185 144*   BMET Recent Labs    08/14/21 2343 08/16/21 0146  NA 138 140  K 3.6 4.1  CL 104 107  CO2 27 29  GLUCOSE 105* 96  BUN 14 7*  CREATININE 0.69 0.74  CALCIUM 8.2* 8.0*   PT/INR No results for input(s): "LABPROT", "INR" in the last 72 hours. CMP     Component Value Date/Time   NA 140 08/16/2021 0146   NA 140 08/03/2018 0853   K 4.1 08/16/2021 0146   CL 107 08/16/2021 0146   CO2 29 08/16/2021 0146   GLUCOSE 96 08/16/2021 0146   BUN 7 (L) 08/16/2021 0146   BUN 15 08/03/2018 0853   CREATININE 0.74 08/16/2021 0146   CREATININE 0.78 04/22/2021 1234   CREATININE 0.64 09/07/2015 1014   CALCIUM 8.0 (L) 08/16/2021 0146   PROT 7.1 08/14/2021 2343   PROT 6.9 08/03/2018 0853   ALBUMIN 3.9 08/14/2021 2343   ALBUMIN 4.0 08/03/2018 0853   AST 30 08/14/2021 2343   AST 24 04/22/2021 1234   ALT 31 08/14/2021 2343   ALT 20 04/22/2021 1234   ALKPHOS 64 08/14/2021 2343   BILITOT 0.5 08/14/2021 2343   BILITOT 0.5 04/22/2021 1234   GFRNONAA  >60 08/16/2021 0146   GFRNONAA >60 04/22/2021 1234   GFRAA >60 09/15/2019 1616   Lipase     Component Value Date/Time   LIPASE 30 08/14/2021 2343    Studies/Results: DG Abd Portable 1V  Result Date: 08/15/2021 CLINICAL DATA:  Small bowel obstruction EXAM: PORTABLE ABDOMEN - 1 VIEW COMPARISON:  None Available. FINDINGS: Nonobstructive pattern of bowel gas, with gas present to the distal colon. Moderate burden of stool throughout the colon. Excreted contrast in the urinary bladder. No free air on single supine radiograph. IMPRESSION: Nonobstructive pattern of bowel gas, with gas present to the distal colon. Moderate burden of stool throughout the colon. Electronically Signed   By: Delanna Ahmadi M.D.   On: 08/15/2021 13:48   CT ABDOMEN PELVIS W CONTRAST  Result Date: 08/15/2021 CLINICAL DATA:  Generalized abdominal pain and vomiting. EXAM: CT ABDOMEN AND PELVIS WITH CONTRAST TECHNIQUE: Multidetector CT imaging of the abdomen and pelvis was performed using the standard protocol following bolus administration of intravenous contrast. RADIATION DOSE REDUCTION: This exam was performed according to the departmental dose-optimization program which includes automated exposure control, adjustment of the mA and/or kV according to patient size and/or use of iterative reconstruction technique. CONTRAST:  171m OMNIPAQUE  IOHEXOL 300 MG/ML  SOLN COMPARISON:  12/13/2018. FINDINGS: Lower chest: No acute abnormality. Hepatobiliary: Scattered hypodensities are present in the liver, the largest measuring 2.7 cm is cystic in attenuation. There is intrahepatic biliary ductal dilatation which is likely related to patient's post cholecystectomy status. The common bile duct is within normal limits. Pancreas: Unremarkable. No pancreatic ductal dilatation or surrounding inflammatory changes. Spleen: Normal in size without focal abnormality. Adrenals/Urinary Tract: No adrenal nodule or mass. Cysts are present in the kidneys  bilaterally. The kidneys enhance symmetrically. No renal calculus or hydronephrosis. The bladder is unremarkable. Stomach/Bowel: Gastric bypass surgical changes are noted. Appendix appears normal. There are borderline distended loops of small bowel in the abdomen with bowel wall thickening with no definite transition point. No free air or pneumatosis. A few scattered diverticula are present along the colon without evidence of diverticulitis. Vascular/Lymphatic: Aortic atherosclerosis. No enlarged abdominal or pelvic lymph nodes. Reproductive: Status post hysterectomy. No adnexal masses. Other: Mesenteric edema is noted and there is a trace amount of free fluid in the pelvis. Small fat containing umbilical hernia. Musculoskeletal: Degenerative changes are present in the thoracolumbar spine. No acute osseous abnormality. IMPRESSION: 1. Borderline distended loops of small bowel in the abdomen with pronounced bowel wall thickening. Differential diagnosis includes enteritis versus partial or early small bowel obstruction. Short-term follow-up is recommended. 2. Hepatic and renal cysts. 3. Diverticulosis without diverticulitis. 4. Aortic atherosclerosis. Electronically Signed   By: Brett Fairy M.D.   On: 08/15/2021 00:46    Anti-infectives: Anti-infectives (From admission, onward)    None        Assessment/Plan SBO 71 y.o. female with a hx of prior gastric bypass in 2013 who presented with abdominal pain, n/v. CT scan showed possible partial or early sbo, no free air or pneumatosis. During her last surgery in 2019 by Dr. Excell Seltzer he noted the JJ mesentery was closed with no mesenteric defect and there was no peterson defect. Xray yesterday with contrast in colon. Tolerating cld without n/v. Having bowel function. Pain resolved this am. Exam improved as above. ADAT. If tolerates diet, can be dc'd from our standpoint as early as later today.    FEN - Soft, IVF VTE - SCDs, okay for chemical ppx from our  standpoint ID - None indicated from our standpoint     LOS: 1 day    Jillyn Ledger , [facility] 08/16/2021, 8:05 AM Please see Amion for pager number during day hours 7:00am-4:30pm

## 2021-08-16 NOTE — Progress Notes (Signed)
Pt discharge to DC lounge by [employer].

## 2021-08-16 NOTE — Progress Notes (Signed)
Pt reports 3 Bms this morning; 1 formed and 2 liquid. She has been tolerating clear liquids with no reports of N/V.

## 2021-08-21 ENCOUNTER — Encounter (INDEPENDENT_AMBULATORY_CARE_PROVIDER_SITE_OTHER): Payer: Self-pay

## 2021-08-31 ENCOUNTER — Other Ambulatory Visit (HOSPITAL_COMMUNITY): Payer: Self-pay

## 2021-09-02 ENCOUNTER — Other Ambulatory Visit (HOSPITAL_COMMUNITY): Payer: Self-pay

## 2021-09-02 ENCOUNTER — Ambulatory Visit (INDEPENDENT_AMBULATORY_CARE_PROVIDER_SITE_OTHER): Payer: No Typology Code available for payment source | Admitting: Family Medicine

## 2021-09-02 ENCOUNTER — Encounter: Payer: Self-pay | Admitting: Family Medicine

## 2021-09-02 ENCOUNTER — Other Ambulatory Visit: Payer: Self-pay | Admitting: Family Medicine

## 2021-09-02 VITALS — BP 120/78 | HR 72 | Temp 98.2°F | Ht 61.0 in | Wt 125.2 lb

## 2021-09-02 DIAGNOSIS — J3489 Other specified disorders of nose and nasal sinuses: Secondary | ICD-10-CM | POA: Diagnosis not present

## 2021-09-02 DIAGNOSIS — F5102 Adjustment insomnia: Secondary | ICD-10-CM | POA: Diagnosis not present

## 2021-09-02 DIAGNOSIS — Z9884 Bariatric surgery status: Secondary | ICD-10-CM

## 2021-09-02 DIAGNOSIS — K56609 Unspecified intestinal obstruction, unspecified as to partial versus complete obstruction: Secondary | ICD-10-CM | POA: Diagnosis not present

## 2021-09-02 DIAGNOSIS — E611 Iron deficiency: Secondary | ICD-10-CM | POA: Diagnosis not present

## 2021-09-02 MED ORDER — MUPIROCIN 2 % EX OINT
1.0000 | TOPICAL_OINTMENT | Freq: Two times a day (BID) | CUTANEOUS | 0 refills | Status: DC
  Filled 2021-09-02: qty 22, 11d supply, fill #0

## 2021-09-02 MED ORDER — DIAZEPAM 10 MG PO TABS: 10.0000 mg | ORAL_TABLET | Freq: Every day | ORAL | Status: DC | PRN

## 2021-09-02 MED ORDER — TRAZODONE HCL 100 MG PO TABS: 100.0000 mg | ORAL_TABLET | Freq: Every day | ORAL | 3 refills | Status: DC

## 2021-09-02 NOTE — Progress Notes (Signed)
Subjective  CC:  Chief Complaint  Patient presents with   Hospitalization Follow-up    Pt stated that she feels much better     HPI: Denise Rice is a 71 y.o. female who presents to the office today to address the problems listed above in the chief complaint. 71 year old status post gastric bypass in 2014 was hospitalized August 2 through August 4 for early small bowel obstruction treated conservatively.  Fortunately with IV fluids, Cipro for possible enteritis and time.  She did well without significant intervention.  Lab work did show a anemia that was thought to be dilutional and hypocalcemia which she has had in the past.  Since she has been home, she has been using Metamucil daily and has had several bowel movements.  Her CT scan while in the hospital did show a significant stool burden.  She feels that that has improved.  No abdominal pain.  No further nausea or vomiting. Iron deficiency anemia and B12 deficiency: Managed by hematology.  Continues on iron and knows that that can cause constipation. 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 She has been nasal piercing and complains of 2-monthhistory of recurrent pustule that is developed just above the piercing.  It will deliver pus.  Sometimes gets irritated.  She has tried over-the-counter peroxide.  Assessment  1. SBO (small bowel obstruction) ([place]   2. Hypocalcemia   3. 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 Iron deficiency   5. Adjustment insomnia   6. Nasal sore      Plan  SBO: Resolved.  Agree with daily Metamucil.  Stool softeners if needed for iron induced constipation Recheck anemia.  She does have follow-up in a few months with hematology.  She is on iron and B12 supplements Recheck hypocalcemia.  Albumin was low normal.  Check PTH.  She does take over-the-counter calcium supplements Continue trazodone nightly.  Insomnia is controlled Nasal sore: Possibly infected, possible irritant dermatitis:  Recommend taking out started, using Bactroban twice a day for a week and may add over-the-counter steroid if redness persists.  If cannot get healed, recommend dermatology eval  Follow up: For CPE 11/20/2021  Orders Placed This Encounter  Procedures   CBC with Differential/Platelet   Comprehensive metabolic panel   PTH, Intact (ICMA) and Ionized Calcium   Meds ordered this encounter  Medications   diazepam (VALIUM) 10 MG tablet    Sig: Take 1 tablet (10 mg total) by mouth daily as needed for anxiety.   traZODone (DESYREL) 100 MG tablet    Sig: Take 1 tablet (100 mg total) by mouth at bedtime.    Dispense:  90 tablet    Refill:  3   mupirocin ointment (BACTROBAN) 2 %    Sig: Apply 1 Application topically 2 (two) times daily.    Dispense:  15 g    Refill:  0      I reviewed the patients updated PMH, FH, and SocHx.    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 06/25/2012    Priority: Medium    History of laparoscopic adjustable gastric banding 01/22/2011    Priority: Medium    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   busPIRone (BUSPAR) 7.5 MG tablet TAKE 1 TABLET BY MOUTH TWICE DAILY AS NEEDED FOR ANXIETY (Patient taking differently: Take 7.5 mg by mouth daily as needed (anxiety).)   Cholecalciferol (VITAMIN D) 50 MCG (2000 UT) CAPS Take 2,000 Units by mouth daily.   cyanocobalamin (,VITAMIN B-12,) 1000 MCG/ML injection Inject 1 mL (1,000 mcg total) into the muscle every 30 (thirty) days.   ferrous sulfate 325 (65 FE) MG tablet Take 325 mg by mouth daily with breakfast.   mupirocin ointment (BACTROBAN) 2 % Apply 1 Application topically 2 (two) times daily.   Potassium (POTASSIMIN PO) Take 1 tablet by mouth daily.   Syringe/Needle, Disp, (SYRINGE 3CC/25GX1") 25G X 1" 3 ML MISC Use once monthly for injection of Vitamin B-12   [DISCONTINUED] diazepam (VALIUM) 10 MG tablet TAKE 1 TABLET BY MOUTH ONCE DAILY (Patient taking differently: Take 10 mg by mouth daily as needed for anxiety.)   [DISCONTINUED] traZODone (DESYREL) 100 MG tablet Take 1 to 1 & 1/2 tablets by mouth at bedtime. (Patient taking differently: Take 100 mg by mouth at bedtime.)    Allergies: Patient is allergic to lactose, topiramate, and alprazolam. Family History: Patient family history includes Anxiety disorder in her mother; Cancer in her father, paternal aunt, and paternal uncle; Diabetes in her father, maternal aunt, mother, and sister; Heart attack in her mother; Heart disease in her father, maternal aunt, and mother; Hyperlipidemia in her father and mother; Hypertension in her father and mother; Kidney disease in her father and mother; Obesity in her father and mother; Stroke in her father, mother, and sister; Thyroid disease in her mother. Social History:  Patient  reports that she has never smoked. She has never used smokeless tobacco. She reports that she does not drink alcohol and does not use  drugs.  Review of Systems: Constitutional: Negative for fever malaise or anorexia Cardiovascular: negative for chest pain Respiratory: negative for SOB or persistent cough Gastrointestinal: negative for abdominal pain  Objective  Vitals: BP 120/78   Pulse 72   Temp 98.2 F (36.8 C)   Ht '5\' 1"'$  (1.549 m)   Wt 125 lb 3.2 oz (56.8 kg)   LMP 01/13/2005   SpO2 99%   BMI 23.66 kg/m  General: no acute distress , A&Ox3 HEENT: PEERL, conjunctiva normal, neck is supple, superior to nasal piercing there is a red small 1 mm pustule.  Visible internal nasal mucosa is clear without mass Cardiovascular:  RRR without murmur or gallop.  Respiratory:  Good breath sounds bilaterally, CTAB with normal respiratory effort Gastrointestinal: soft, flat abdomen, normal active bowel sounds, no palpable masses, no hepatosplenomegaly, no appreciated hernias Nontender    Commons side effects, risks, benefits, and alternatives  for medications and treatment plan prescribed today were discussed, and the patient expressed understanding of the given instructions. Patient is instructed to call or message via MyChart if he/she has any questions or concerns regarding our treatment plan. No barriers to understanding were identified. We discussed Red Flag symptoms and signs in detail. Patient expressed understanding regarding what to do in case of urgent or emergency type symptoms.  Medication list was reconciled, printed and provided to the patient in AVS. Patient instructions and summary information was reviewed with the patient as documented in the AVS. This note was prepared with assistance of Dragon voice recognition software. Occasional wrong-word or sound-a-like substitutions may have occurred due to the inherent limitations of voice recognition software  This visit occurred during the SARS-CoV-2 public health emergency.  Safety protocols were in place, including screening questions prior to the visit, additional  usage of staff PPE, and extensive cleaning of exam room while observing appropriate contact time as indicated for disinfecting solutions.

## 2021-09-02 NOTE — Patient Instructions (Addendum)
Please follow up as scheduled for your next visit with me: 11/20/2021   Please call [facility] and ask to mail your bactroban.   If you have any questions or concerns, please don't hesitate to send me a message via MyChart or call the office at [phone]. Thank you for visiting with Denise Rice today! It's our pleasure caring for you.

## 2021-09-03 ENCOUNTER — Other Ambulatory Visit (HOSPITAL_COMMUNITY): Payer: Self-pay

## 2021-09-03 LAB — CBC WITH DIFFERENTIAL/PLATELET
Basophils Absolute: 0 10*3/uL (ref 0.0–0.1)
Basophils Relative: 0.9 % (ref 0.0–3.0)
Eosinophils Absolute: 0 10*3/uL (ref 0.0–0.7)
Eosinophils Relative: 0.7 % (ref 0.0–5.0)
HCT: 35.7 % — ABNORMAL LOW (ref 36.0–46.0)
Hemoglobin: 12.2 g/dL (ref 12.0–15.0)
Lymphocytes Relative: 30.1 % (ref 12.0–46.0)
Lymphs Abs: 1.2 10*3/uL (ref 0.7–4.0)
MCHC: 34.3 g/dL (ref 30.0–36.0)
MCV: 81.7 fl (ref 78.0–100.0)
Monocytes Absolute: 0.3 10*3/uL (ref 0.1–1.0)
Monocytes Relative: 7.2 % (ref 3.0–12.0)
Neutro Abs: 2.5 10*3/uL (ref 1.4–7.7)
Neutrophils Relative %: 61.1 % (ref 43.0–77.0)
Platelets: 151 10*3/uL (ref 150.0–400.0)
RBC: 4.37 Mil/uL (ref 3.87–5.11)
RDW: 13.5 % (ref 11.5–15.5)
WBC: 4.1 10*3/uL (ref 4.0–10.5)

## 2021-09-03 LAB — PTH, INTACT (ICMA) AND IONIZED CALCIUM
Calcium, Ion: 4.7 mg/dL (ref 4.7–5.5)
Calcium: 8.6 mg/dL (ref 8.6–10.4)
PTH: 25 pg/mL (ref 16–77)

## 2021-09-03 LAB — COMPREHENSIVE METABOLIC PANEL
ALT: 27 U/L (ref 0–35)
AST: 33 U/L (ref 0–37)
Albumin: 4.1 g/dL (ref 3.5–5.2)
Alkaline Phosphatase: 62 U/L (ref 39–117)
BUN: 14 mg/dL (ref 6–23)
CO2: 29 mEq/L (ref 19–32)
Calcium: 8.5 mg/dL (ref 8.4–10.5)
Chloride: 103 mEq/L (ref 96–112)
Creatinine, Ser: 0.74 mg/dL (ref 0.40–1.20)
GFR: 82.91 mL/min (ref >60.00)
Glucose, Bld: 95 mg/dL (ref 70–99)
Potassium: 4.1 mEq/L (ref 3.5–5.1)
Sodium: 139 mEq/L (ref 135–145)
Total Bilirubin: 0.4 mg/dL (ref 0.2–1.2)
Total Protein: 6.7 g/dL (ref 6.0–8.3)

## 2021-09-21 ENCOUNTER — Encounter: Payer: Self-pay | Admitting: Plastic Surgery

## 2021-09-21 ENCOUNTER — Ambulatory Visit (INDEPENDENT_AMBULATORY_CARE_PROVIDER_SITE_OTHER): Payer: Self-pay | Admitting: Plastic Surgery

## 2021-09-21 DIAGNOSIS — Z719 Counseling, unspecified: Secondary | ICD-10-CM

## 2021-09-21 NOTE — Progress Notes (Signed)
Filler Injection Procedure Note  Procedure:  Filler administration  Pre-operative Diagnosis: Rytides   Post-operative Diagnosis: Same  Surgeon: Electronically signed by: Theodoro Kos, DO   Complications:  None  Brief history: The patient desires injection with fillers in her face. I discussed with the patient this proposed procedure of filler injections, which is customized depending on the particular needs of the patient. It is performed on facial volume loss as a temporary correction. The alternatives were discussed with the patient. The risks were addressed including bleeding, scarring, infection, damage to deeper structures, asymmetry, and chronic pain, which may occur infrequently after a procedure. The individual's choice to undergo a surgical procedure is based on the comparison of risks to potential benefits. Other risks include unsatisfactory results, allergic reaction, which should go away with time, bruising and delayed healing. Fillers do not arrest the aging process or produce permanent tightening.  Operative intervention maybe necessary to maintain the results. The patient understands and wishes to proceed. An informed consent was signed and informational brochures given to her prior to the procedure.  Procedure: The area was prepped with chlorhexidine and dried with a clean gauze. Using a clean technique, a 30 gauge needle was then used to inject the filler into the midface area with one syringe on each side. No complications were noted. Light pressure was held for 5 minutes. She was instructed explicitly in post-operative care.  Restylane Contour x 2  LOT: [id number] EXP: 2022-04-13

## 2021-09-26 DIAGNOSIS — M25562 Pain in left knee: Secondary | ICD-10-CM | POA: Diagnosis not present

## 2021-09-28 ENCOUNTER — Other Ambulatory Visit: Payer: Self-pay | Admitting: Family Medicine

## 2021-09-30 ENCOUNTER — Other Ambulatory Visit: Payer: Self-pay

## 2021-10-01 MED ORDER — DIAZEPAM 10 MG PO TABS: 10.0000 mg | ORAL_TABLET | Freq: Every day | ORAL | Status: DC | PRN

## 2021-10-03 ENCOUNTER — Other Ambulatory Visit (HOSPITAL_COMMUNITY): Payer: Self-pay

## 2021-10-03 ENCOUNTER — Telehealth: Payer: Self-pay

## 2021-10-03 ENCOUNTER — Other Ambulatory Visit: Payer: Self-pay | Admitting: Family Medicine

## 2021-10-03 MED ORDER — DIAZEPAM 10 MG PO TABS
10.0000 mg | ORAL_TABLET | Freq: Every day | ORAL | 1 refills | Status: DC
  Filled 2021-10-03: qty 30, 30d supply, fill #0
  Filled 2021-10-30: qty 30, 30d supply, fill #1

## 2021-10-03 NOTE — Telephone Encounter (Signed)
Verbal order called in to [facility] for diazepam (VALIUM) '10mg'$  #30 with 1 refill.

## 2021-10-05 DIAGNOSIS — M25562 Pain in left knee: Secondary | ICD-10-CM | POA: Diagnosis not present

## 2021-10-10 ENCOUNTER — Encounter: Payer: Self-pay | Admitting: Family Medicine

## 2021-10-11 ENCOUNTER — Other Ambulatory Visit: Payer: Self-pay

## 2021-10-11 DIAGNOSIS — I739 Peripheral vascular disease, unspecified: Secondary | ICD-10-CM | POA: Diagnosis not present

## 2021-10-11 DIAGNOSIS — M25562 Pain in left knee: Secondary | ICD-10-CM | POA: Diagnosis not present

## 2021-10-14 ENCOUNTER — Ambulatory Visit
Admission: EM | Admit: 2021-10-14 | Discharge: 2021-10-14 | Disposition: A | Discharge status: Home / Self Care | Payer: No Typology Code available for payment source

## 2021-10-14 ENCOUNTER — Other Ambulatory Visit (HOSPITAL_COMMUNITY): Payer: Self-pay

## 2021-10-14 ENCOUNTER — Encounter: Payer: Self-pay | Admitting: Emergency Medicine

## 2021-10-14 DIAGNOSIS — R1032 Left lower quadrant pain: Secondary | ICD-10-CM

## 2021-10-14 DIAGNOSIS — A045 Campylobacter enteritis: Secondary | ICD-10-CM | POA: Diagnosis not present

## 2021-10-14 DIAGNOSIS — K5289 Other specified noninfective gastroenteritis and colitis: Secondary | ICD-10-CM | POA: Diagnosis not present

## 2021-10-14 DIAGNOSIS — N281 Cyst of kidney, acquired: Secondary | ICD-10-CM | POA: Diagnosis not present

## 2021-10-14 DIAGNOSIS — K7689 Other specified diseases of liver: Secondary | ICD-10-CM | POA: Diagnosis not present

## 2021-10-14 DIAGNOSIS — K838 Other specified diseases of biliary tract: Secondary | ICD-10-CM | POA: Diagnosis not present

## 2021-10-14 DIAGNOSIS — K6389 Other specified diseases of intestine: Secondary | ICD-10-CM | POA: Diagnosis not present

## 2021-10-14 MED ORDER — AZITHROMYCIN 500 MG PO TABS
ORAL_TABLET | ORAL | 0 refills | Status: DC
  Filled 2021-10-14: qty 4, 4d supply, fill #0

## 2021-10-14 MED ORDER — DICYCLOMINE HCL 20 MG PO TABS
ORAL_TABLET | ORAL | 0 refills | Status: DC
  Filled 2021-10-14: qty 20, 10d supply, fill #0

## 2021-10-14 MED ORDER — ONDANSETRON 4 MG PO TBDP
ORAL_TABLET | ORAL | 0 refills | Status: DC
  Filled 2021-10-14: qty 12, 4d supply, fill #0

## 2021-10-14 NOTE — ED Notes (Signed)
Patient is being discharged from the Urgent Care and sent to the Emergency Department via private vehicle . Per Juventino Slovak, NP patient is in need of higher level of care due to further evaluation. Patient is aware and verbalizes understanding of plan of care.  Vitals:   10/14/21 0831  BP: 123/85  Pulse: 72  Resp: 18  Temp: 97.9 F (36.6 C)  SpO2: 99%

## 2021-10-14 NOTE — Discharge Instructions (Addendum)
Patient sent to ER due to severe abdominal pain.  Patient verbalized understanding of instructions.

## 2021-10-14 NOTE — ED Triage Notes (Signed)
Patient c/o abdominal pain x 4 days, tan colored, mucous stools, wt loss of 8 lbs in a week.  Patient does have a history of small bowel obstruction, had surgery a couple of years ago.  Patient denies any OTC meds.

## 2021-10-14 NOTE — ED Provider Notes (Signed)
[facility]    CSN: [id number] Arrival date & time: 10/14/21  9798      History   Chief Complaint Chief Complaint  Patient presents with   Abdominal Pain    HPI VENNESSA Rice is a 71 y.o. female.  Patient complaining of left lower quadrant abdominal pain x4 days.  Patient endorses pain is 9 out of 10.  Patient states she is having episodes of "watery stools with tan color, mucus, drops of blood".  Patient endorses 1 episode of emesis that occurred this morning.  Patient endorses 8 pounds of weight loss that occurred in the past week.  Patient is not currently trying to lose weight.  Patient endorses eating normal meals.  Patient endorses increased pain with eating.  Patient endorses weakness.  Patient denies lightheadedness.  Patient endorses colonoscopy that occurred approximately last year with normal results per patient statement.  Patient denies alcohol use.  Patient has history of small bowel obstruction, last hospital admission was in August of this year.  Patient has history of gastric bypass, PCOS, lap adhesion resection, diverticulosis (seen on last CT).  Patient has not taken any medications for symptoms.   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   IFG (impaired fasting glucose) 11/16/2020   Iron deficiency anemia 10/12/2020   Chronic migraine 05/04/2019   Encounter for counseling 03/08/2019   Vitamin B12 deficiency 07/29/2018   Anxiety 03/14/2018   Elschnig bodies following cataract surgery, 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   History of small bowel obstruction 11/17/2017   Chronic vertigo 03/11/2017   Major depression, chronic 03/11/2017   PCOS (polycystic ovarian syndrome) 03/11/2017   Insomnia 09/08/2015   Bilateral sensorineural hearing loss 09/06/2014   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 06/25/2012   History of laparoscopic adjustable gastric banding 01/22/2011   Vitamin D deficiency 01/15/2008    Past Surgical History:  Procedure Laterality Date   BLEPHAROPLASTY  03-05-2000   CRANIOTOMY  08/2014   to repair dehescence in right ear   HAMMER TOE SURGERY Left 08-29-2013   2nd toe   IMPLANTATION BONE ANCHORED HEARING AID Left 07/27/2008   left temporal bone;now removed   LAPAROSCOPIC CHOLECYSTECTOMY  03-31-2005   LAPAROSCOPIC GASTRIC BANDING  02-08-2007   LAPAROSCOPY N/A 11/17/2017   Procedure: LAPAROSCOPY LYSIS OF ADHESIONS FOR SMALL BOWEL OBSTRUCTION;  Surgeon: Excell Seltzer, MD;  Location: [place];  Service: General;  Laterality: N/A;   LASER ABLATION CONDOLAMATA N/A 07/30/2012   Procedure: LASER ABLATION CONDOLAMATA;  Surgeon: Leighton Ruff, MD;  Location[facility];  Service: General;  Laterality: N/A;   LASIK Bilateral 2002   LYSIS OF ADHESION N/A 11/17/2017   Procedure: LYSIS OF ADHESION;  Surgeon: Excell Seltzer, MD;  Location: [place];  Service: General;  Laterality: N/A;   ROUX-EN-Y GASTRIC  BYPASS  AUG 2013   [place];  1994 x2;  02-03-1998   STRABISMUS SURGERY Right    VAGINAL HYSTERECTOMY  2007   fibroids    OB History     Gravida  0   Para      Term      Preterm      AB      Living         SAB      IAB      Ectopic      Multiple      Live Births               Home Medications    Prior to Admission medications   Medication Sig Start Date End Date Taking? Authorizing Provider  acetaminophen (TYLENOL) 325 MG tablet Take 325 mg by mouth daily as needed for headache.    [provider]  busPIRone (BUSPAR) 7.5 MG tablet TAKE 1 TABLET BY MOUTH TWICE DAILY AS NEEDED FOR ANXIETY Patient taking differently: Take 7.5 mg by mouth daily as needed (anxiety). 01/01/21 01/01/22  [name], MD  Cholecalciferol (VITAMIN D) 50 MCG (2000 UT) CAPS Take 2,000 Units by mouth daily.    [provider]  cyanocobalamin (,VITAMIN B-12,) 1000 MCG/ML injection Inject 1 mL (1,000 mcg total) into the muscle every 30 (thirty) days. 09/04/20   [name], MD  diazepam (VALIUM) 10 MG tablet Take 1 tablet (10 mg total) by mouth daily as needed for anxiety. 10/01/21 03/30/22  [name], MD  diazepam (VALIUM) 10 MG tablet Take 1 tablet (10 mg total) by mouth daily as needed for anxiety 10/03/21   [name], MD  ferrous sulfate 325 (65 FE) MG tablet Take 325 mg by mouth daily with breakfast.    [provider]  mupirocin ointment (BACTROBAN) 2 % Apply to affected areas topically 2 (two) times daily. 09/02/21   [name], MD  Potassium (POTASSIMIN PO) Take 1 tablet by mouth daily.    [provider]  Syringe/Needle, Disp, (SYRINGE 3CC/25GX1") 25G X 1" 3 ML MISC Use once monthly for injection of Vitamin B-12 06/28/21   Marin Olp, MD  traZODone (DESYREL) 100 MG tablet Take 1 tablet (100 mg total) by mouth at bedtime. 09/02/21 09/02/22  [name], MD    Family History Family History  Problem  Relation Age of Onset   Heart attack Mother    Heart disease Mother    Diabetes Mother    Hypertension Mother    Hyperlipidemia Mother    Stroke Mother    Kidney disease Mother    Thyroid disease Mother    Anxiety disorder Mother    Obesity Mother    Cancer Father        unknown primary   Diabetes Father    Hypertension Father    Hyperlipidemia Father    Heart disease Father    Stroke Father    Kidney disease Father  Obesity Father    Stroke Sister    Diabetes Sister    Diabetes Maternal Aunt    Heart disease Maternal Aunt    Cancer Paternal Aunt    Cancer Paternal Uncle    Colon cancer Neg Hx    Colon polyps Neg Hx     Social History Social History   Tobacco Use   Smoking status: Never   Smokeless tobacco: Never  Vaping Use   Vaping Use: Never used  Substance Use Topics   Alcohol use: No    Alcohol/week: 0.0 standard drinks of alcohol   Drug use: No     Allergies   Lactose, Topiramate, and Alprazolam   Review of Systems Review of Systems  Constitutional:  Positive for activity change, fatigue and unexpected weight change. Negative for appetite change, chills and fever.  Respiratory: Negative.    Cardiovascular: Negative.   Gastrointestinal:  Positive for abdominal pain, blood in stool, diarrhea, nausea and vomiting. Negative for abdominal distention, anal bleeding, constipation and rectal pain.  Genitourinary: Negative.      Physical Exam Triage Vital Signs ED Triage Vitals  Enc Vitals Group     BP 10/14/21 0831 123/85     Pulse Rate 10/14/21 0831 72     Resp 10/14/21 0831 18     Temp 10/14/21 0831 97.9 F (36.6 C)     Temp Source 10/14/21 0831 Oral     SpO2 10/14/21 0831 99 %     Weight 10/14/21 0832 117 lb (53.1 kg)     Height 10/14/21 0832 '5\' 1"'$  (1.549 m)     Head Circumference --      Peak Flow --      Pain Score 10/14/21 0832 9     Pain Loc --      Pain Edu? --      Excl. in [place]? --    No data found.  Updated Vital Signs BP  123/85 (BP Location: Right Arm)   Pulse 72   Temp 97.9 F (36.6 C) (Oral)   Resp 18   Ht '5\' 1"'$  (1.549 m)   Wt 117 lb (53.1 kg)   LMP 01/13/2005   SpO2 99%   BMI 22.11 kg/m   Physical Exam Abdominal:     General: Abdomen is flat. Bowel sounds are decreased. There is no distension. There are no signs of injury.     Palpations: Abdomen is soft.     Tenderness: There is abdominal tenderness in the left lower quadrant.  Neurological:     Mental Status: She is alert.      UC Treatments / Results  Labs (all labs ordered are listed, but only abnormal results are displayed) Labs Reviewed - No data to display  EKG   Radiology No results found.  Procedures Procedures (including critical care time)  Medications Ordered in UC Medications - No data to display  Initial Impression / Assessment and Plan / UC Course  I have reviewed the triage vital signs and the nursing notes.  Pertinent labs & imaging results that were available during my care of the patient were reviewed by me and considered in my medical decision making (see chart for details).  Patient sent to emergency department due to severe abdominal pain, age, and past medical history.  Patient needs higher level of care due to severity of pain and past medical history.  Patient verbalizes understanding of instructions.  Husband was present at bedside and will take patient to emergency department.  Final Clinical Impressions(s) / UC Diagnoses   Final diagnoses:  Left lower quadrant abdominal pain     Discharge Instructions      Patient sent to ER due to severe abdominal pain.  Patient verbalized understanding of instructions.    ED Prescriptions   None    PDMP not reviewed this encounter.   Flossie Dibble, NP 10/14/21 [phone]

## 2021-10-15 ENCOUNTER — Encounter: Payer: Self-pay | Admitting: Physician Assistant

## 2021-10-15 ENCOUNTER — Telehealth: Payer: Self-pay | Admitting: Emergency Medicine

## 2021-10-15 ENCOUNTER — Ambulatory Visit (INDEPENDENT_AMBULATORY_CARE_PROVIDER_SITE_OTHER): Payer: No Typology Code available for payment source | Admitting: Family Medicine

## 2021-10-15 ENCOUNTER — Other Ambulatory Visit (HOSPITAL_COMMUNITY): Payer: Self-pay

## 2021-10-15 ENCOUNTER — Encounter: Payer: Self-pay | Admitting: Family Medicine

## 2021-10-15 VITALS — BP 108/64 | HR 70 | Temp 98.3°F | Ht 61.0 in | Wt 124.6 lb

## 2021-10-15 DIAGNOSIS — A045 Campylobacter enteritis: Secondary | ICD-10-CM

## 2021-10-15 DIAGNOSIS — Z23 Encounter for immunization: Secondary | ICD-10-CM | POA: Diagnosis not present

## 2021-10-15 DIAGNOSIS — E860 Dehydration: Secondary | ICD-10-CM

## 2021-10-15 DIAGNOSIS — E538 Deficiency of other specified B group vitamins: Secondary | ICD-10-CM | POA: Diagnosis not present

## 2021-10-15 DIAGNOSIS — Z8719 Personal history of other diseases of the digestive system: Secondary | ICD-10-CM | POA: Diagnosis not present

## 2021-10-15 MED ORDER — CYANOCOBALAMIN 1000 MCG/ML IJ SOLN
1000.0000 ug | INTRAMUSCULAR | 2 refills | Status: DC
  Filled 2021-10-15: qty 1, 30d supply, fill #0
  Filled 2022-01-13: qty 1, 30d supply, fill #1
  Filled 2022-03-30: qty 1, 30d supply, fill #2
  Filled 2022-04-25: qty 1, 30d supply, fill #3
  Filled 2022-05-25: qty 1, 30d supply, fill #4
  Filled 2022-06-24: qty 1, 30d supply, fill #5
  Filled 2022-07-29: qty 1, 30d supply, fill #6
  Filled 2022-08-29: qty 1, 30d supply, fill #7
  Filled 2022-09-22: qty 1, 30d supply, fill #8

## 2021-10-15 NOTE — Progress Notes (Signed)
Subjective  CC:  Chief Complaint  Patient presents with   Blood In Stools    Pt stated that when she went to ED yesterday for abdominal pain they also found blood in her stool    Abdominal Pain   Hospitalization Follow-up     HPI: Denise Rice is a 71 y.o. female who presents to the office today to address the problems listed above in the chief complaint. 71 year old female with history of gastric bypass surgery and history of small bowel obstruction presented to urgent care yesterday afternoon due to severe abdominal pain.  She had sent me messages last week regarding rapid weight loss but did not mention diarrhea.  She was then transferred to [place] emergency room for further evaluation due to severe abdominal pain.  Patient reports that she has had many days of frequent loose crampy diarrheal stools that then became associated with mucus and blood in the stool late last week.  She has had significant weight loss despite a normal eating.  No fevers.  Emergency room evaluation was thorough, I reviewed all lab work and CT scan showing enteritis.  Stool testing was positive for Campylobacter.  She was treated with IV fluids for dehydration, pain medicines and antinausea medicines.  Today she is feeling somewhat better.  Azithromycin has been started for treatment of Campylobacter.  She remains foggy from the medications, mildly lightheaded without chest pain or palpitations and mildly tender on exam.  Diarrhea is subsiding.  Lab work was remarkable for mild low white blood count, no anemia, normal renal function and electrolytes.  Negative C. difficile. Vitamin B12 deficiency: Taking monthly injections and needs refills.  Anemia has resolved.  Assessment  1. Campylobacter enteritis   2. Need for immunization against influenza   3. Vitamin B12 deficiency   4. History of small bowel obstruction   5. Dehydration      Plan  Campylobacter enteritis: Improving.  Benign abdomen.  Recommend  continued oral rehydration.  Rest.  Complete antibiotic therapy.  Reassured.  Most weight loss was likely due to fluid loss.  She will monitor her weight at home.  Continue healthy diet once recovered. B12 injections, refill medications.  Patient gives injections at home.  Anemia has resolved Fortunately, no small bowel obstruction.  She will monitor her pain I spent a total of 34 minutes for this patient encounter. Time spent included preparation, face-to-face counseling with the patient and coordination of care, review of chart and records, and documentation of the encounter.   Follow up: For physical as scheduled 11/20/2021  Orders Placed This Encounter  Procedures   Flu Vaccine QUAD High Dose(Fluad)   Meds ordered this encounter  Medications   cyanocobalamin (VITAMIN B12) 1000 MCG/ML injection    Sig: Inject 1 mL (1,000 mcg total) into the muscle every 30 (thirty) days.    Dispense:  10 mL    Refill:  2      I reviewed the patients updated PMH, FH, and SocHx.    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 06/25/2012    Priority: Medium    History of laparoscopic adjustable gastric banding 01/22/2011    Priority: Medium    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   azithromycin (ZITHROMAX) 500 MG tablet Take 1 tablet (500 mg dose) by mouth daily for 4 days.   busPIRone (BUSPAR) 7.5 MG tablet TAKE 1 TABLET BY MOUTH TWICE DAILY AS NEEDED FOR ANXIETY (Patient taking differently: Take 7.5 mg by mouth daily as needed (anxiety).)   Cholecalciferol (VITAMIN D) 50 MCG (2000 UT) CAPS Take 2,000 Units by mouth daily.   cyanocobalamin (VITAMIN B12) 1000 MCG/ML injection Inject 1 mL (1,000 mcg total) into the muscle every 30 (thirty) days.   diazepam (VALIUM) 10 MG tablet Take 1 tablet (10 mg total) by mouth daily as needed for anxiety   dicyclomine (BENTYL) 20 MG tablet Take one tablet (20 mg dose) by mouth 2 (two) times daily.   ferrous sulfate 325 (65 FE) MG tablet Take 325 mg by mouth daily with breakfast.   mupirocin ointment (BACTROBAN) 2 % Apply to affected areas topically 2 (two) times daily.   ondansetron (ZOFRAN-ODT) 4 MG disintegrating tablet Dissolve 1 tablet (4 mg dose) by mouth every 8 (eight) hours as needed for nausea for up to 7 days.   Potassium (POTASSIMIN PO) Take 1 tablet by mouth daily.   Syringe/Needle, Disp, (SYRINGE 3CC/25GX1") 25G X 1" 3 ML MISC Use once monthly for injection of Vitamin B-12   traZODone (DESYREL) 100 MG tablet Take 1 tablet (100 mg total) by mouth at bedtime.   [DISCONTINUED] cyanocobalamin (,VITAMIN B-12,) 1000 MCG/ML injection Inject 1 mL (1,000 mcg total) into the muscle every 30 (thirty) days.    Allergies: Patient is allergic to lactose, topiramate, and alprazolam. Family History: Patient family history includes Anxiety disorder in her mother; Cancer in her father, paternal aunt, and paternal uncle;  Diabetes in her father, maternal aunt, mother, and sister; Heart attack in her mother; Heart disease in her father, maternal aunt, and mother; Hyperlipidemia in her father and mother; Hypertension in her father and mother; Kidney disease in her father and mother; Obesity in her father and mother; Stroke in her father, mother, and sister; Thyroid disease in her mother. Social History:  Patient  reports that she has never smoked. She has never used smokeless tobacco. She reports that she does not drink alcohol and does not use drugs.  Review of Systems: Constitutional: Negative for fever malaise or anorexia Cardiovascular: negative for chest pain Respiratory: negative for SOB or persistent cough Gastrointestinal: negative for abdominal pain  Objective  Vitals: BP 108/64   Pulse 70   Temp 98.3 F (36.8 C)   Ht '5\' 1"'$  (1.549 m)   Wt 124 lb 9.6 oz (56.5 kg)   LMP 01/13/2005   SpO2 97%   BMI 23.54 kg/m  General: no acute distress , A&Ox3, appears fatigued, nontoxic HEENT: PEERL, conjunctiva normal, neck is supple Cardiovascular:  RRR without murmur or gallop.  Respiratory:  Good breath sounds bilaterally, CTAB with normal respiratory effort Abdomen: Diffusely mildly tender without rebound or guarding, no masses palpated, normal bowel sounds Skin:  Warm, no rashes  Reviewed labs from care everywhere, [facility]  Commons side effects, risks, benefits, and alternatives for medications and treatment plan prescribed today were discussed, and the patient expressed understanding of the given instructions. Patient is instructed to call or message via MyChart if he/she has any questions or concerns regarding our treatment plan. No barriers to understanding were identified. We discussed Red Flag symptoms and signs in detail. Patient expressed understanding regarding what to do in case of urgent or emergency type symptoms.  Medication list was reconciled, printed and provided to the patient in AVS.  Patient instructions and summary information was reviewed with the patient as documented in the AVS. This note was prepared with assistance of Dragon voice recognition software. Occasional wrong-word or sound-a-like substitutions may have occurred due to the inherent limitations of voice recognition software  This visit occurred during the SARS-CoV-2 public health emergency.  Safety protocols were in place, including screening questions prior to the visit, additional usage of staff PPE, and extensive cleaning of exam room while observing appropriate contact time as indicated for disinfecting solutions.

## 2021-10-15 NOTE — Patient Instructions (Signed)
Please follow up as scheduled for your next visit with me: 11/20/2021   If you have any questions or concerns, please don't hesitate to send me a message via MyChart or call the office at [phone]. Thank you for visiting with [place] today! It's our pleasure caring for you.   Campylobacter Gastroenteritis Campylobacter gastroenteritis is a common infection of the stomach and intestines that can cause diarrhea and other symptoms. It is caused by Campylobacter bacteria. These bacteria often infect animals, and the condition spreads easily to other animals and humans through food and water that contain the bacteria (are contaminated). Campylobacter gastroenteritis is also known as traveler's diarrhea, and it is more common in countries where food and water are contaminated. In very rare cases, a campylobacter infection may lead to another condition called Guillain-Barr syndrome (GBS). This condition can occur when the body's disease-fighting system (immune system) overreacts after an infection. Signs of GBS include a progressive weakness or inability to move (paralysis) that is usually symmetrical, or found on both sides of the body. Weakness may last for weeks or even years. What are the causes? This condition is caused by Campylobacter bacteria. You may get this infection if you: Drink water that is contaminated by stool (feces) of an infected animal or person. Eat raw or undercooked meat from an infected animal. Eat raw or undercooked fruits or vegetables that have come in contact with the stool or meat of an infected animal. Drink milk that has not been heated enough to kill bacteria (not pasteurized). Touch anything that is contaminated, then touch your mouth before you have washed your hands. What increases the risk? People at higher risk of infection include: Young children. Aging adults. People with a weak immune system, such as people who have AIDS (acquired immunodeficiency syndrome), cancer,  or other long-term (chronic) diseases. Pregnant women. What are the signs or symptoms? Symptoms of this condition include: Watery diarrhea, which may be bloody. Pain in the abdomen. Cramps. Fever. Nausea and vomiting. Muscle aches or headache. Symptoms usually start 2-5 days after infection occurs. Symptoms may be more severe in people who have a weak immune system. Healthy people may have the infection without showing any symptoms. How is this diagnosed? This condition may be diagnosed based on: Your symptoms. Recent history of travel to a place where water contamination is common. A physical exam. Testing a sample of stool or body fluid to check for bacteria. How is this treated? In most cases, this condition goes away without treatment within 7 days. Your health care provider may: Recommend an oral rehydration solution (ORS). This is a drink that helps you replace fluids and electrolytes. It is found at pharmacies and retail stores. Prescribe medicines to relieve nausea, vomiting, or diarrhea. Prescribe antibiotic medicine, if you have a weak immune system or severe symptoms. Give you IV fluids at the hospital to prevent dehydration, if you cannot drink enough to replace fluids that you lost because of severe vomiting or diarrhea. Follow these instructions at home: Medicines Take over-the-counter and prescription medicines only as told by your health care provider. Do not take medicines to help with diarrhea. These medicines can make the infection worse. If you were prescribed an antibiotic, take it as told by your health care provider. Do not stop taking the antibiotic even if you start to feel better. Eating and drinking  Drink clear fluids as you are able. Clear fluids include water, ice chips, diluted fruit juice, and low-calorie sports drinks. Eat small meals  throughout the day that include healthy foods such as whole grains, lean meats, and fruits and vegetables. Drink  enough fluid to keep your urine pale yellow. This is especially important if you are vomiting or have diarrhea. Avoid fluids that contain a lot of sugar or caffeine, such as energy drinks, soda, and some sports drinks. Avoid alcohol. General instructions Rest at home until your symptoms go away. Return to your normal activities as told by your health care provider. Wash your hands often with soap and hot water for at least 20 seconds. If soap and water are not available, use hand sanitizer. Keep all follow-up visits. This is important. How is this prevented?  Use a meat thermometer to make sure you cook meat to the recommended temperature. Always wash fruits and vegetables before eating or preparing them. Do not drink unpasteurized (raw) milk. Do not let food come in contact with raw meat or any juice from raw meat. After you prepare raw meat, wash your hands, countertops, cutting boards, and all utensils with hot water and soap. If you use a cloth dishtowel, do not use it again until you have washed it. Wash your hands with hot water and soap after going to the bathroom, changing a diaper, or coming in contact with pet feces. When traveling in a country where contamination is common: Do not eat any uncooked foods. Drink only bottled water. Contact a health care provider if: Your symptoms last more than 7 days. Your symptoms get worse. You have a fever. Get help right away if: You cannot drink fluids without vomiting. You have trouble breathing. You develop weakness or paralysis. You have symptoms of dehydration, such as: Dry skin. Thirst. Decreased or dark urine. Tiredness or confusion. These symptoms may represent a serious problem that is an emergency. Do not wait to see if the symptoms will go away. Get medical help right away. Call your local emergency services (911 in the [place] Do not drive yourself to the hospital. Summary Campylobacter gastroenteritis is a common infection  that can cause diarrhea and other symptoms. It is caused by bacteria. Campylobacter gastroenteritis is also known as traveler's diarrhea, and it is more common in countries where food and water are contaminated. In most cases, this condition goes away without treatment within 7 days. Get help right away if you cannot drink fluids without vomiting, you have trouble breathing, you have symptoms of dehydration, or you develop weakness or paralysis. This information is not intended to replace advice given to you by your health care provider. Make sure you discuss any questions you have with your health care provider. Document Revised: 04/24/2020 Document Reviewed: 04/24/2020 Elsevier Patient Education  [employer].

## 2021-10-17 ENCOUNTER — Ambulatory Visit (INDEPENDENT_AMBULATORY_CARE_PROVIDER_SITE_OTHER): Payer: No Typology Code available for payment source | Admitting: Vascular Surgery

## 2021-10-17 ENCOUNTER — Encounter: Payer: Self-pay | Admitting: Vascular Surgery

## 2021-10-17 ENCOUNTER — Ambulatory Visit (HOSPITAL_COMMUNITY)
Admission: RE | Admit: 2021-10-17 | Discharge: 2021-10-17 | Disposition: A | Discharge status: Home / Self Care | Payer: No Typology Code available for payment source | Source: Ambulatory Visit | Attending: Cardiology | Admitting: Cardiology

## 2021-10-17 VITALS — BP 128/83 | HR 63 | Temp 98.3°F | Resp 20 | Ht 61.0 in | Wt 125.0 lb

## 2021-10-17 DIAGNOSIS — I739 Peripheral vascular disease, unspecified: Secondary | ICD-10-CM

## 2021-10-17 NOTE — Progress Notes (Signed)
ASSESSMENT & PLAN   PERIPHERAL ARTERIAL DISEASE: This patient's plain x-ray demonstrates calcific disease in her left superficial femoral artery and popliteal artery.  However this is not producing any significant stenosis.  She has palpable pedal pulses and a normal noninvasive study except for calcific disease on the left.  Toe pressures are normal bilaterally.  She has normal waveforms by Doppler.  I do not think any further work-up is indicated.  I encouraged her to stay as active as possible.  She is not a smoker.  I will see her as needed.  BILATERAL LOWER EXTREMITY SWELLING:  We have discussed the importance of daily leg elevation and the proper positioning for this.  I encouraged her to wear compression stockings.  Certainly if this progresses she would need formal venous duplex testing.  REASON FOR CONSULT:    Calcifications noted on MRI.  The consult is requested by Dr. Gladstone Lighter.  HPI:   Denise Rice is a 71 y.o. female who was seen by Dr. Gladstone Lighter with the knee pain.  She had a plain x-ray which showed significant calcific disease in her superficial femoral artery and popliteal artery.  She was sent for vascular consultation.  On my history I do not get any history of claudication, rest pain, or nonhealing ulcers.  She does get cramps in her legs.  She also complains of some swelling in both legs.  She wears compression stockings some and tries to elevate her legs.  She denies any previous history of DVT.  Her only real risk factor for peripheral arterial disease is a family history of premature cardiovascular disease.  She denies any history of diabetes, hypertension, hypercholesterolemia, or tobacco use.  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    craniotomy for repair of R ear   Vertigo    Vitamin B12 deficiency 07/29/2018   Started IM injections monthly 06/2018   Vitamin D deficiency     Family History  Problem Relation Age of Onset   Heart attack Mother    Heart disease Mother    Diabetes Mother    Hypertension Mother    Hyperlipidemia Mother    Stroke Mother    Kidney disease Mother    Thyroid disease Mother    Anxiety disorder Mother    Obesity Mother    Cancer Father        unknown primary   Diabetes Father    Hypertension Father    Hyperlipidemia Father    Heart disease Father    Stroke Father    Kidney disease Father    Obesity Father    Stroke Sister    Diabetes Sister    Diabetes Maternal Aunt    Heart disease Maternal Aunt    Cancer Paternal Aunt    Cancer Paternal Uncle    Colon cancer Neg Hx    Colon polyps Neg  Hx     SOCIAL HISTORY: Social History   Tobacco Use   Smoking status: Never   Smokeless tobacco: Never  Substance Use Topics   Alcohol use: No    Alcohol/week: 0.0 standard drinks of alcohol    Allergies  Allergen Reactions   Lactose Diarrhea   Topiramate Nausea Only   Alprazolam Nausea Only    Current Outpatient Medications  Medication Sig Dispense Refill   acetaminophen (TYLENOL) 325 MG tablet Take 325 mg by mouth daily as needed for headache.     azithromycin (ZITHROMAX) 500 MG tablet Take 1 tablet (500 mg dose) by mouth daily for 4 days. 4 tablet 0   busPIRone (BUSPAR) 7.5 MG tablet TAKE 1 TABLET BY MOUTH TWICE DAILY AS NEEDED FOR ANXIETY (Patient taking differently: Take 7.5 mg by mouth daily as needed (anxiety).) 90 tablet 4   Cholecalciferol (VITAMIN D) 50 MCG (2000 UT) CAPS Take 2,000 Units  by mouth daily.     cyanocobalamin (VITAMIN B12) 1000 MCG/ML injection Inject 1 mL (1,000 mcg total) into the muscle every 30 (thirty) days. 10 mL 2   diazepam (VALIUM) 10 MG tablet Take 1 tablet (10 mg total) by mouth daily as needed for anxiety 30 tablet 1   dicyclomine (BENTYL) 20 MG tablet Take one tablet (20 mg dose) by mouth 2 (two) times daily. 20 tablet 0   ferrous sulfate 325 (65 FE) MG tablet Take 325 mg by mouth daily with breakfast.     mupirocin ointment (BACTROBAN) 2 % Apply to affected areas topically 2 (two) times daily. 22 g 0   ondansetron (ZOFRAN-ODT) 4 MG disintegrating tablet Dissolve 1 tablet (4 mg dose) by mouth every 8 (eight) hours as needed for nausea for up to 7 days. 12 tablet 0   Potassium (POTASSIMIN PO) Take 1 tablet by mouth daily.     Syringe/Needle, Disp, (SYRINGE 3CC/25GX1") 25G X 1" 3 ML MISC Use once monthly for injection of Vitamin B-12 50 each 0   traZODone (DESYREL) 100 MG tablet Take 1 tablet (100 mg total) by mouth at bedtime. 90 tablet 3   No current facility-administered medications for this visit.    REVIEW OF SYSTEMS:  '[X]'$  denotes positive finding, '[ ]'$  denotes negative finding Cardiac  Comments:  Chest pain or chest pressure:    Shortness of breath upon exertion:    Short of breath when lying flat:    Irregular heart rhythm:        Vascular    Pain in calf, thigh, or hip brought on by ambulation:    Pain in feet at night that wakes you up from your sleep:     Blood clot in your veins:    Leg swelling:  x       Pulmonary    Oxygen at home:    Productive cough:     Wheezing:         Neurologic    Sudden weakness in arms or legs:     Sudden numbness in arms or legs:     Sudden onset of difficulty speaking or slurred speech:    Temporary loss of vision in one eye:     Problems with dizziness:         Gastrointestinal    Blood in stool:     Vomited blood:         Genitourinary    Burning when urinating:     Blood in urine:  Psychiatric    Major depression:         Hematologic    Bleeding problems:    Problems with blood clotting too easily:        Skin    Rashes or ulcers:        Constitutional    Fever or chills:    -  PHYSICAL EXAM:   Vitals:   10/17/21 1244  BP: 128/83  Pulse: 63  Resp: 20  Temp: 98.3 F (36.8 C)  SpO2: 96%  Weight: 125 lb (56.7 kg)  Height: '5\' 1"'$  (1.549 m)   Body mass index is 23.62 kg/m. GENERAL: The patient is a well-nourished female, in no acute distress. The vital signs are documented above. CARDIAC: There is a regular rate and rhythm.  VASCULAR: I do not detect carotid bruits. She has palpable femoral, popliteal, and dorsalis pedis pulses bilaterally. She has mild bilateral lower extremity swelling. PULMONARY: There is good air exchange bilaterally without wheezing or rales. ABDOMEN: Soft and non-tender with normal pitched bowel sounds.  I do not palpate an aneurysm. MUSCULOSKELETAL: There are no major deformities. NEUROLOGIC: No focal weakness or paresthesias are detected. SKIN: There are no ulcers or rashes noted. PSYCHIATRIC: The patient has a normal affect.  DATA:    ARTERIAL DOPPLER STUDY: I have reviewed the arterial Doppler study today.  On the right side, there is a biphasic posterior tibial signal and peroneal signal.  There is a triphasic dorsalis pedis signal.  ABIs 100%.  Toe pressures 110 mmHg.  On the left side there is a triphasic posterior tibial dorsalis pedis and peroneal signal.  ABI is falsely elevated secondary to calcific disease.  Toe pressures 118 mmHg.  Deitra [facility]

## 2021-10-18 ENCOUNTER — Other Ambulatory Visit: Payer: Self-pay | Admitting: Family Medicine

## 2021-10-18 ENCOUNTER — Other Ambulatory Visit (HOSPITAL_COMMUNITY): Payer: Self-pay

## 2021-10-18 MED ORDER — BUSPIRONE HCL 7.5 MG PO TABS
7.5000 mg | ORAL_TABLET | Freq: Two times a day (BID) | ORAL | 5 refills | Status: DC | PRN
  Filled 2021-10-18: qty 60, 30d supply, fill #0
  Filled 2021-11-24: qty 60, 30d supply, fill #1
  Filled 2021-12-24: qty 60, 30d supply, fill #2
  Filled 2022-01-21: qty 60, 30d supply, fill #3
  Filled 2022-02-18: qty 60, 30d supply, fill #4
  Filled 2022-03-18: qty 60, 30d supply, fill #5

## 2021-10-21 ENCOUNTER — Ambulatory Visit: Payer: No Typology Code available for payment source | Admitting: Hematology & Oncology

## 2021-10-21 ENCOUNTER — Other Ambulatory Visit: Payer: No Typology Code available for payment source

## 2021-10-25 ENCOUNTER — Encounter: Payer: Self-pay | Admitting: Family

## 2021-10-28 ENCOUNTER — Encounter: Payer: Self-pay | Admitting: Family

## 2021-10-28 ENCOUNTER — Other Ambulatory Visit (HOSPITAL_COMMUNITY): Payer: Self-pay

## 2021-10-28 ENCOUNTER — Ambulatory Visit (INDEPENDENT_AMBULATORY_CARE_PROVIDER_SITE_OTHER): Payer: No Typology Code available for payment source | Admitting: Family

## 2021-10-28 VITALS — BP 130/74 | HR 60 | Temp 97.7°F | Ht 61.0 in | Wt 119.5 lb

## 2021-10-28 DIAGNOSIS — K529 Noninfective gastroenteritis and colitis, unspecified: Secondary | ICD-10-CM

## 2021-10-28 MED ORDER — ONDANSETRON 4 MG PO TBDP
ORAL_TABLET | ORAL | 0 refills | Status: DC
  Filled 2021-10-28: qty 12, 4d supply, fill #0

## 2021-10-28 MED ORDER — DICYCLOMINE HCL 20 MG PO TABS
ORAL_TABLET | ORAL | 0 refills | Status: DC
  Filled 2021-10-28: qty 20, 10d supply, fill #0

## 2021-10-28 NOTE — Progress Notes (Unsigned)
Patient ID: Denise Rice, female    DOB: 07-Feb-1950, 71 y.o.   MRN: [id number]  Chief Complaint  Patient presents with   Nausea    Pt c/o nausea, sharp abdominal pains underneath belly button and diarrhea since Friday. Has tried Zofran, which does help some with the nausea. Hurts worse when she lays down.    HPI:    Enteritis sx:   pt seen in ED for Enteritis d/t campylobactor, took antibiotics and felt better for about a week then sx back again, severe abd pain, with nausea, vomiting. Taking Zofran which is helping some, but she ran out of the Bentyl. Wt down 6lbs in last 2 weeks.   Assessment & Plan:  1. Gastroenteritis - seen in ER 10/2 w/exact same sx & treated for Campylobactor; refilling Bentyl & Zofran, reminded pt on use & SE, reviewed case with Dr. Jonni Sanger and will also order new stool studies. Advised pt on resting her gut again with mostly fluids today, then start BRAT diet as able.  - dicyclomine (BENTYL) 20 MG tablet; Take one tablet (20 mg dose) by mouth 2 (two) times daily.  Dispense: 20 tablet; Refill: 0 - ondansetron (ZOFRAN-ODT) 4 MG disintegrating tablet; Dissolve 1 tablet (4 mg dose) by mouth every 8 (eight) hours as needed for nausea  Dispense: 12 tablet; Refill: 0 - C. difficile GDH and Toxin A/B; Future - Fecal lactoferrin, quant; Future - Ova and parasite examination; Future - Salmonella/Shigella Cult, Campy EIA and Shiga Toxin reflex; Future  Subjective:    Outpatient Medications Prior to Visit  Medication Sig Dispense Refill   acetaminophen (TYLENOL) 325 MG tablet Take 325 mg by mouth daily as needed for headache.     busPIRone (BUSPAR) 7.5 MG tablet Take 1 tablet (7.5 mg total) by mouth 2 (two) times daily as needed (anxiety). 60 tablet 5   Cholecalciferol (VITAMIN D) 50 MCG (2000 UT) CAPS Take 2,000 Units by mouth daily.     cyanocobalamin (VITAMIN B12) 1000 MCG/ML injection Inject 1 mL (1,000 mcg total) into the muscle every 30 (thirty) days. 10 mL 2    diazepam (VALIUM) 10 MG tablet Take 1 tablet (10 mg total) by mouth daily as needed for anxiety 30 tablet 1   ferrous sulfate 325 (65 FE) MG tablet Take 325 mg by mouth daily with breakfast.     mupirocin ointment (BACTROBAN) 2 % Apply to affected areas topically 2 (two) times daily. 22 g 0   Potassium (POTASSIMIN PO) Take 1 tablet by mouth daily.     Syringe/Needle, Disp, (SYRINGE 3CC/25GX1") 25G X 1" 3 ML MISC Use once monthly for injection of Vitamin B-12 50 each 0   traZODone (DESYREL) 100 MG tablet Take 1 tablet (100 mg total) by mouth at bedtime. 90 tablet 3   ondansetron (ZOFRAN-ODT) 4 MG disintegrating tablet Dissolve 1 tablet (4 mg dose) by mouth every 8 (eight) hours as needed for nausea for up to 7 days. 12 tablet 0   azithromycin (ZITHROMAX) 500 MG tablet Take 1 tablet (500 mg dose) by mouth daily for 4 days. 4 tablet 0   dicyclomine (BENTYL) 20 MG tablet Take one tablet (20 mg dose) by mouth 2 (two) times daily. 20 tablet 0   No facility-administered medications prior to visit.   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    craniotomy for repair of R ear   Vertigo    Vitamin B12 deficiency 07/29/2018   Started IM injections monthly 06/2018   Vitamin D deficiency    Past Surgical History:  Procedure Laterality Date   BLEPHAROPLASTY  03-05-2000   CRANIOTOMY  08/2014   to repair dehescence in right ear   HAMMER TOE SURGERY Left 08-29-2013   2nd toe   IMPLANTATION BONE  ANCHORED HEARING AID Left 07/27/2008   left temporal bone;now removed   LAPAROSCOPIC CHOLECYSTECTOMY  03-31-2005   LAPAROSCOPIC GASTRIC BANDING  02-08-2007   LAPAROSCOPY N/A 11/17/2017   Procedure: LAPAROSCOPY LYSIS OF ADHESIONS FOR SMALL BOWEL OBSTRUCTION;  Surgeon: Excell Seltzer, MD;  Location: [facility];  Service: General;  Laterality: N/A;   LASER ABLATION CONDOLAMATA N/A 07/30/2012   Procedure: LASER ABLATION CONDOLAMATA;  Surgeon: Leighton Ruff, MD;  Location: [facility];  Service: General;  Laterality: N/A;   LASIK Bilateral 2002   LYSIS OF ADHESION N/A 11/17/2017   Procedure: LYSIS OF ADHESION;  Surgeon: Excell Seltzer, MD;  Location: [facility];  Service: General;  Laterality: N/A;   ROUX-EN-Y GASTRIC BYPASS  AUG 2013   [place];  1994 x2;  02-03-1998   STRABISMUS SURGERY Right    VAGINAL HYSTERECTOMY  2007   fibroids   Allergies  Allergen Reactions   Lactose Diarrhea   Topiramate Nausea Only   Alprazolam Nausea Only      Objective:    Physical Exam Vitals and nursing note reviewed.  Constitutional:      Appearance: Normal appearance.  Cardiovascular:     Rate and Rhythm: Normal rate and regular rhythm.  Pulmonary:     Effort: Pulmonary effort is normal.     Breath sounds: Normal breath sounds.  Musculoskeletal:        General: Normal range of motion.  Skin:    General: Skin is warm and dry.  Neurological:     Mental Status: She is alert.  Psychiatric:        Mood and Affect: Mood normal.        Behavior: Behavior normal.    BP 130/74 (BP Location: Left Arm, Patient Position: Sitting, Cuff Size: Large)   Pulse 60   Temp 97.7 F (36.5 C) (Temporal)   Ht '5\' 1"'$  (1.549 m)   Wt 119 lb 8 oz (54.2 kg)   LMP 01/13/2005   SpO2 99%   BMI 22.58 kg/m  Wt Readings from Last 3 Encounters:  10/28/21 119 lb 8 oz (54.2 kg)  10/17/21 125 lb (56.7 kg)  10/15/21 124 lb 9.6 oz (56.5 kg)       Jeanie Sewer, NP

## 2021-10-29 ENCOUNTER — Ambulatory Visit: Payer: No Typology Code available for payment source | Admitting: Hematology & Oncology

## 2021-10-29 ENCOUNTER — Ambulatory Visit (INDEPENDENT_AMBULATORY_CARE_PROVIDER_SITE_OTHER): Payer: No Typology Code available for payment source

## 2021-10-29 ENCOUNTER — Inpatient Hospital Stay: Payer: No Typology Code available for payment source

## 2021-10-29 DIAGNOSIS — Z0000 Encounter for general adult medical examination without abnormal findings: Secondary | ICD-10-CM | POA: Diagnosis not present

## 2021-10-29 DIAGNOSIS — K529 Noninfective gastroenteritis and colitis, unspecified: Secondary | ICD-10-CM | POA: Diagnosis not present

## 2021-10-29 NOTE — Progress Notes (Signed)
I connected with  Denise Rice on 10/29/21 by a audio enabled telemedicine application and verified that I am speaking with the correct person using two identifiers.  Patient Location: Home  Provider Location: Home Office  I discussed the limitations of evaluation and management by telemedicine. The patient expressed understanding and agreed to proceed.   Subjective:   LEEANNE Rice is a 71 y.o. female who presents for Medicare Annual (Subsequent) preventive examination.  Review of Systems     Cardiac Risk Factors include: advanced age (>18mn, >>90women)     Objective:    There were no vitals filed for this visit. There is no height or weight on file to calculate BMI.     10/29/2021    7:56 AM 08/15/2021    2:51 AM 08/14/2021   11:19 PM 04/22/2021    1:12 PM 11/21/2020    4:02 PM 10/25/2020    1:35 PM 10/11/2020    1:58 PM  Advanced Directives  Does Patient Have a Medical Advance Directive? Yes Yes No Yes No Yes   Type of AParamedicof [place]re[place] will [place]Living will;Out of facility DNR (pink MOST or yellow form)  Living will;Healthcare Power of [place]even [place] FordLiving will [place]Living will  Does patient want to make changes to medical advance directive?  Yes (ED - send information to [place]lden  No - Patient declined   No - Patient declined  Copy of HBradleyin Chart? No - copy requested No - copy requested  No - copy requested  No - copy requested   Would patient like information on creating a medical advance directive?    No - Patient declined No - Patient declined      Current Medications (verified) Outpatient Encounter Medications as of 10/29/2021  Medication Sig   acetaminophen (TYLENOL) 325 MG tablet Take 325 mg by mouth daily as needed for headache.   busPIRone (BUSPAR) 7.5 MG tablet Take 1 tablet (7.5 mg total) by mouth 2 (two) times daily as needed  (anxiety).   Cholecalciferol (VITAMIN D) 50 MCG (2000 UT) CAPS Take 2,000 Units by mouth daily.   cyanocobalamin (VITAMIN B12) 1000 MCG/ML injection Inject 1 mL (1,000 mcg total) into the muscle every 30 (thirty) days.   diazepam (VALIUM) 10 MG tablet Take 1 tablet (10 mg total) by mouth daily as needed for anxiety   dicyclomine (BENTYL) 20 MG tablet Take one tablet (20 mg dose) by mouth 2 (two) times daily.   ferrous sulfate 325 (65 FE) MG tablet Take 325 mg by mouth daily with breakfast.   mupirocin ointment (BACTROBAN) 2 % Apply to affected areas topically 2 (two) times daily.   ondansetron (ZOFRAN-ODT) 4 MG disintegrating tablet Dissolve 1 tablet (4 mg dose) by mouth every 8 (eight) hours as needed for nausea   Potassium (POTASSIMIN PO) Take 1 tablet by mouth daily.   Syringe/Needle, Disp, (SYRINGE 3CC/25GX1") 25G X 1" 3 ML MISC Use once monthly for injection of Vitamin B-12   traZODone (DESYREL) 100 MG tablet Take 1 tablet (100 mg total) by mouth at bedtime.   No facility-administered encounter medications on file as of 10/29/2021.    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    craniotomy for repair of R ear   Vertigo    Vitamin B12 deficiency 07/29/2018   Started IM injections monthly 06/2018   Vitamin D deficiency    Past Surgical History:  Procedure Laterality Date   BLEPHAROPLASTY  03-05-2000    CRANIOTOMY  08/2014   to repair dehescence in right ear   HAMMER TOE SURGERY Left 08-29-2013   2nd toe   IMPLANTATION BONE ANCHORED HEARING AID Left 07/27/2008   left temporal bone;now removed   LAPAROSCOPIC CHOLECYSTECTOMY  03-31-2005   LAPAROSCOPIC GASTRIC BANDING  02-08-2007   LAPAROSCOPY N/A 11/17/2017   Procedure: LAPAROSCOPY LYSIS OF ADHESIONS FOR SMALL BOWEL OBSTRUCTION;  Surgeon: Excell Seltzer, MD;  Location: [facility];  Service: General;  Laterality: N/A;   LASER ABLATION CONDOLAMATA N/A 07/30/2012   Procedure: LASER ABLATION CONDOLAMATA;  Surgeon: Leighton Ruff, MD;  Location: [facility];  Service: General;  Laterality: N/A;   LASIK Bilateral 2002   LYSIS OF ADHESION N/A 11/17/2017   Procedure: LYSIS OF ADHESION;  Surgeon: Excell Seltzer, MD;  Location: [facility];  Service: General;  Laterality: N/A;   ROUX-EN-Y GASTRIC BYPASS  AUG 2013   [place];  1994 x2;  02-03-1998   STRABISMUS SURGERY Right    VAGINAL HYSTERECTOMY  2007   fibroids   Family History  Problem Relation Age of Onset   Heart attack Mother    Heart disease Mother    Diabetes Mother    Hypertension Mother    Hyperlipidemia Mother    Stroke Mother    Kidney disease Mother    Thyroid disease Mother    Anxiety disorder Mother    Obesity Mother    Cancer Father        unknown primary   Diabetes Father    Hypertension Father    Hyperlipidemia Father    Heart disease Father    Stroke Father    Kidney disease Father    Obesity Father    Stroke Sister    Diabetes Sister    Diabetes Maternal Aunt    Heart disease Maternal Aunt    Cancer Paternal Aunt    Cancer Paternal Uncle    Colon cancer Neg Hx    Colon polyps Neg Hx    Social History   Socioeconomic History   Marital status: Married    Spouse name: Vicenta Dunning    Number of children: 1   Years of education: MBA   Highest education level: Not on file  Occupational History   Occupation: Archivist - retired    Fish farm manager: [employer]    Comment: [place]   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   Left handed.   Caffeine use: 2-3 cups (coffee) per day    Social Determinants of Health   Financial Resource Strain: Low Risk  (10/29/2021)   Overall Financial Resource Strain (CARDIA)    Difficulty of Paying Living Expenses: Not hard at all  Food Insecurity: No Food Insecurity (10/29/2021)   Hunger Vital Sign    Worried About Running Out of Food in the Last Year: Never true    Ran Out of Food in the Last Year: Never true  Transportation Needs: No Transportation Needs (10/29/2021)   PRAPARE - Hydrologist (Medical): No    Lack of Transportation (Non-Medical): No  Physical Activity: Sufficiently Active (10/29/2021)   Exercise Vital Sign    Days of Exercise per Week: 3 days    Minutes of Exercise per Session: 60 min  Stress: Stress Concern Present (10/29/2021)   [place]    Feeling of Stress : To some extent  Social Connections: Moderately Isolated (10/29/2021)   Social Connection and Isolation Panel [NHANES]    Frequency of Communication with Friends and Family: More than three times a week    Frequency of Social Gatherings with Friends and Family: More than three times a week    Attends Religious Services: Never    Marine scientist or Organizations: No    Attends Music therapist: Never    Marital Status: Married    Tobacco Counseling Counseling given: Not  Answered   Clinical Intake:  Pre-visit preparation completed: Yes  Pain : No/denies pain     BMI - recorded: 22.58 Nutritional Risks: None Diabetes: No  How often do you need to have someone help you when you read instructions, pamphlets, or other written materials from your doctor or pharmacy?: 1 - Never  Diabetic?no  Interpreter Needed?: No  Information entered by :: Charlott Rakes, LPN   Activities of Daily Living    10/29/2021    8:10 AM 10/29/2021    7:57 AM  In your present state of health, do you have any difficulty performing the following activities:  Hearing?  1  Comment  Deaf left ear and hearing aids right ear  Vision?  0  Difficulty concentrating or making decisions?  0  Walking or climbing stairs?  0  Dressing or bathing?  0  Doing errands, shopping?  0  Preparing Food and eating ?  N  Using the Toilet?  N  In the past six months, have you accidently leaked urine? N   Do you have problems with loss of bowel control? N   Managing your Medications?  N  Managing your Finances?  N  Housekeeping or managing your Housekeeping?  N    Patient Care Team: [name], MD as PCP - General (Family Medicine) Megan Salon, MD as Consulting Physician (Gynecology) Melvenia Beam, MD as Consulting Physician (Neurology) Junius Roads, Legrand Como, MD as Consulting Physician (Sports Medicine)  Indicate any recent Medical Services you may have received from other than [facility] providers in the past year (date may be approximate).     Assessment:   This is a routine wellness examination for [place].  Hearing/Vision screen Hearing Screening - Comments:: Deaf left ear and hearing aids right ear   Dietary issues and exercise activities discussed: Current Exercise Habits: Home exercise  routine, Type of exercise: Other - see comments (work with Clinical research associate), Time (Minutes): 60, Frequency (Times/Week): 3, Weekly Exercise (Minutes/Week): 180   Goals Addressed              This Visit's Progress    Patient Stated       Maintain health       Depression Screen    10/29/2021    7:54 AM 10/15/2021   10:03 AM 09/02/2021    2:57 PM 11/16/2020    8:44 AM 10/25/2020    1:33 PM 09/23/2019    8:06 AM 05/04/2019   11:32 AM  PHQ 2/9 Scores  PHQ - 2 Score 0 0 0 0 0 3 4  PHQ- 9 Score    0  6 17    Fall Risk    10/29/2021    7:57 AM 10/15/2021   10:03 AM 09/02/2021    2:57 PM 10/25/2020    1:36 PM 09/23/2019    8:05 AM  Fall Risk   Falls in the past year? 1 0 0 1 1  Number falls in past yr: 1 0 0 1 0  Injury with Fall? 0 0 0 0 1  Risk for fall due to : No Fall Risks;Impaired balance/gait;Impaired vision No Fall Risks No Fall Risks Impaired balance/gait;Impaired mobility;Impaired vision   Follow up Falls prevention discussed Falls evaluation completed Falls evaluation completed Falls prevention discussed     FALL RISK PREVENTION PERTAINING TO THE HOME:  Any stairs in or around the home? Yes  If so, are there any without handrails? No  Home free of loose throw rugs in walkways, pet beds, electrical cords, etc? Yes  Adequate lighting in your home to reduce risk of falls? Yes   ASSISTIVE DEVICES UTILIZED TO PREVENT FALLS:  Life alert? No  Use of a cane, walker or w/c? No  Grab bars in the bathroom? Yes  Shower chair or bench in shower? Yes  Elevated toilet seat or a handicapped toilet? No   TIMED UP AND GO:  Was the test performed? No .  Cognitive Function:        10/29/2021    7:59 AM 10/25/2020    1:39 PM  6CIT Screen  What Year? 0 points 0 points  What month? 0 points 0 points  What time? 0 points 0 points  Count back from 20 0 points 0 points  Months in reverse 0 points 0 points  Repeat phrase 0 points 6 points  Total Score 0 points 6 points    Immunizations Immunization History  Administered Date(s) Administered   Fluad Quad(high Dose 65+) 10/01/2018, 09/23/2019, 10/25/2020, 10/15/2021   Hepatitis A 08/01/2015, 02/06/2016    Hepatitis B 08/01/2015, 10/03/2015, 02/06/2016   Influenza-Unspecified 09/29/2014, 09/26/2016, 10/01/2018   PFIZER(Purple Top)SARS-COV-2 Vaccination 01/24/2019, 02/11/2019, 10/27/2019   Pfizer Covid-19 Vaccine Bivalent Booster 26yr & up 12/12/2020   Pneumococcal Conjugate-13 08/25/2014   Pneumococcal Polysaccharide-23 08/15/2013, 06/23/2018   Td 02/14/1995   Tdap 08/12/2016   Zoster Recombinat (Shingrix) 05/09/2021   Zoster, Live 05/19/2013    TDAP status: Up to date  Flu Vaccine status: Up to date  Pneumococcal vaccine status: Up to date  Covid-19 vaccine status: Completed vaccines  Qualifies for Shingles Vaccine? Yes   Zostavax completed Yes   Shingrix Completed?: Yes  Screening Tests Health Maintenance  Topic Date Due   Zoster Vaccines- Shingrix (2 of 2) 07/04/2021   COVID-19 Vaccine (5 - Pfizer risk series) 10/31/2021 (Originally 02/06/2021)   MAMMOGRAM  04/13/2022   DEXA SCAN  04/13/2023   COLONOSCOPY (Pts 45-41yr Insurance coverage will need to be confirmed)  10/11/2025   TETANUS/TDAP  08/13/2026   Pneumonia Vaccine 69 Years old  Completed   INFLUENZA VACCINE  Completed   Hepatitis C Screening  Completed   HPV VACCINES  Aged Out    Health Maintenance  Health Maintenance Due  Topic Date Due   Zoster Vaccines- Shingrix (2 of 2) 07/04/2021    Colorectal cancer screening: Type of screening: Colonoscopy. Completed 10/12/15. Repeat every 10 years  Mammogram status: Completed 04/12/21. Repeat every year  Bone Density status: Completed 04/12/21. Results reflect: Bone density results: OSTEOPOROSIS. Repeat every 2 years.  Additional Screening:  Hepatitis C Screening: Completed 09/07/15  Vision Screening: Recommended annual ophthalmology exams for early detection of glaucoma and other disorders of the eye. Is the patient up to date with their annual eye exam?  Yes  Who is the provider or what is the name of the office in which the patient attends annual eye exams?  Dr sWyatt Portela If pt is not established with a provider, would they like to be referred to a provider to establish care? No .   Dental Screening: Recommended annual dental exams for proper oral hygiene  Community Resource Referral / Chronic Care Management: CRR required this visit?  No   CCM required this visit?  No      Plan:     I have personally reviewed and noted the following in the patient's chart:   Medical and social history Use of alcohol, tobacco or illicit drugs  Current medications and supplements including opioid prescriptions. Patient is not currently taking opioid prescriptions. Functional ability and status Nutritional status Physical activity Advanced directives List of other physicians Hospitalizations, surgeries, and ER visits in previous 12 months Vitals Screenings to include cognitive, depression, and falls Referrals and appointments  In addition, I have reviewed and discussed with patient certain preventive protocols, quality metrics, and best practice recommendations. A written personalized care plan for preventive services as well as general preventive health recommendations were provided to patient.     TWillette Brace LPN   [id number]  Nurse Notes: none

## 2021-10-29 NOTE — Patient Instructions (Signed)
Denise Rice , Thank you for taking time to come for your Medicare Wellness Visit. I appreciate your ongoing commitment to your health goals. Please review the following plan we discussed and let me know if I can assist you in the future.   These are the goals we discussed:  Goals      Patient Stated     None at this time         This is a list of the screening recommended for you and due dates:  Health Maintenance  Topic Date Due   Zoster (Shingles) Vaccine (1 of 2) Never done   COVID-19 Vaccine (5 - Pfizer risk series) 10/31/2021*   Mammogram  04/13/2022   DEXA scan (bone density measurement)  04/13/2023   Colon Cancer Screening  10/11/2025   Tetanus Vaccine  08/13/2026   Pneumonia Vaccine  Completed   Flu Shot  Completed   Hepatitis C Screening: USPSTF Recommendation to screen - Ages 18-79 yo.  Completed   HPV Vaccine  Aged Out  *Topic was postponed. The date shown is not the original due date.    Advanced directives: Please bring a copy of your health care power of attorney and living will to the office at your convenience.   Conditions/risks identified: maintain health   Next appointment: Follow up in one year for your annual wellness visit    Preventive Care 65 Years and Older, Female Preventive care refers to lifestyle choices and visits with your health care provider that can promote health and wellness. What does preventive care include? A yearly physical exam. This is also called an annual well check. Dental exams once or twice a year. Routine eye exams. Ask your health care provider how often you should have your eyes checked. Personal lifestyle choices, including: Daily care of your teeth and gums. Regular physical activity. Eating a healthy diet. Avoiding tobacco and drug use. Limiting alcohol use. Practicing safe sex. Taking low-dose aspirin every day. Taking vitamin and mineral supplements as recommended by your health care provider. What happens during  an annual well check? The services and screenings done by your health care provider during your annual well check will depend on your age, overall health, lifestyle risk factors, and family history of disease. Counseling  Your health care provider may ask you questions about your: Alcohol use. Tobacco use. Drug use. Emotional well-being. Home and relationship well-being. Sexual activity. Eating habits. History of falls. Memory and ability to understand (cognition). Work and work Statistician. Reproductive health. Screening  You may have the following tests or measurements: Height, weight, and BMI. Blood pressure. Lipid and cholesterol levels. These may be checked every 5 years, or more frequently if you are over 62 years old. Skin check. Lung cancer screening. You may have this screening every year starting at age 68 if you have a 30-pack-year history of smoking and currently smoke or have quit within the past 15 years. Fecal occult blood test (FOBT) of the stool. You may have this test every year starting at age 50. Flexible sigmoidoscopy or colonoscopy. You may have a sigmoidoscopy every 5 years or a colonoscopy every 10 years starting at age 27. Hepatitis C blood test. Hepatitis B blood test. Sexually transmitted disease (STD) testing. Diabetes screening. This is done by checking your blood sugar (glucose) after you have not eaten for a while (fasting). You may have this done every 1-3 years. Bone density scan. This is done to screen for osteoporosis. You may have this done  starting at age 88. Mammogram. This may be done every 1-2 years. Talk to your health care provider about how often you should have regular mammograms. Talk with your health care provider about your test results, treatment options, and if necessary, the need for more tests. Vaccines  Your health care provider may recommend certain vaccines, such as: Influenza vaccine. This is recommended every year. Tetanus,  diphtheria, and acellular pertussis (Tdap, Td) vaccine. You may need a Td booster every 10 years. Zoster vaccine. You may need this after age 56. Pneumococcal 13-valent conjugate (PCV13) vaccine. One dose is recommended after age 16. Pneumococcal polysaccharide (PPSV23) vaccine. One dose is recommended after age 50. Talk to your health care provider about which screenings and vaccines you need and how often you need them. This information is not intended to replace advice given to you by your health care provider. Make sure you discuss any questions you have with your health care provider. Document Released: 01/26/2015 Document Revised: 09/19/2015 Document Reviewed: 10/31/2014 Elsevier Interactive Patient Education  2017 [place] Prevention in the Home Falls can cause injuries. They can happen to people of all ages. There are many things you can do to make your home safe and to help prevent falls. What can I do on the outside of my home? Regularly fix the edges of walkways and driveways and fix any cracks. Remove anything that might make you trip as you walk through a door, such as a raised step or threshold. Trim any bushes or trees on the path to your home. Use bright outdoor lighting. Clear any walking paths of anything that might make someone trip, such as rocks or tools. Regularly check to see if handrails are loose or broken. Make sure that both sides of any steps have handrails. Any raised decks and porches should have guardrails on the edges. Have any leaves, snow, or ice cleared regularly. Use sand or salt on walking paths during winter. Clean up any spills in your garage right away. This includes oil or grease spills. What can I do in the bathroom? Use night lights. Install grab bars by the toilet and in the tub and shower. Do not use towel bars as grab bars. Use non-skid mats or decals in the tub or shower. If you need to sit down in the shower, use a plastic,  non-slip stool. Keep the floor dry. Clean up any water that spills on the floor as soon as it happens. Remove soap buildup in the tub or shower regularly. Attach bath mats securely with double-sided non-slip rug tape. Do not have throw rugs and other things on the floor that can make you trip. What can I do in the bedroom? Use night lights. Make sure that you have a light by your bed that is easy to reach. Do not use any sheets or blankets that are too big for your bed. They should not hang down onto the floor. Have a firm chair that has side arms. You can use this for support while you get dressed. Do not have throw rugs and other things on the floor that can make you trip. What can I do in the kitchen? Clean up any spills right away. Avoid walking on wet floors. Keep items that you use a lot in easy-to-reach places. If you need to reach something above you, use a strong step stool that has a grab bar. Keep electrical cords out of the way. Do not use floor polish or wax that  makes floors slippery. If you must use wax, use non-skid floor wax. Do not have throw rugs and other things on the floor that can make you trip. What can I do with my stairs? Do not leave any items on the stairs. Make sure that there are handrails on both sides of the stairs and use them. Fix handrails that are broken or loose. Make sure that handrails are as long as the stairways. Check any carpeting to make sure that it is firmly attached to the stairs. Fix any carpet that is loose or worn. Avoid having throw rugs at the top or bottom of the stairs. If you do have throw rugs, attach them to the floor with carpet tape. Make sure that you have a light switch at the top of the stairs and the bottom of the stairs. If you do not have them, ask someone to add them for you. What else can I do to help prevent falls? Wear shoes that: Do not have high heels. Have rubber bottoms. Are comfortable and fit you well. Are closed  at the toe. Do not wear sandals. If you use a stepladder: Make sure that it is fully opened. Do not climb a closed stepladder. Make sure that both sides of the stepladder are locked into place. Ask someone to hold it for you, if possible. Clearly mark and make sure that you can see: Any grab bars or handrails. First and last steps. Where the edge of each step is. Use tools that help you move around (mobility aids) if they are needed. These include: Canes. Walkers. Scooters. Crutches. Turn on the lights when you go into a dark area. Replace any light bulbs as soon as they burn out. Set up your furniture so you have a clear path. Avoid moving your furniture around. If any of your floors are uneven, fix them. If there are any pets around you, be aware of where they are. Review your medicines with your doctor. Some medicines can make you feel dizzy. This can increase your chance of falling. Ask your doctor what other things that you can do to help prevent falls. This information is not intended to replace advice given to you by your health care provider. Make sure you discuss any questions you have with your health care provider. Document Released: 10/26/2008 Document Revised: 06/07/2015 Document Reviewed: 02/03/2014 Elsevier Interactive Patient Education  2017 [employer].

## 2021-10-30 ENCOUNTER — Other Ambulatory Visit (HOSPITAL_COMMUNITY): Payer: Self-pay

## 2021-11-05 ENCOUNTER — Encounter: Payer: Self-pay | Admitting: Family

## 2021-11-07 LAB — SALMONELLA/SHIGELLA CULT, CAMPY EIA AND SHIGA TOXIN RFL ECOLI
MICRO NUMBER:: 14061944
SHIGA RESULT:: NOT DETECTED
SPECIMEN QUALITY:: ADEQUATE

## 2021-11-07 LAB — C. DIFFICILE GDH AND TOXIN A/B
GDH ANTIGEN: NOT DETECTED
MICRO NUMBER:: 14061943
SPECIMEN QUALITY:: ADEQUATE
TOXIN A AND B: NOT DETECTED

## 2021-11-07 LAB — OVA AND PARASITE EXAMINATION
CONCENTRATE RESULT:: NONE SEEN
MICRO NUMBER:: 14061832
SPECIMEN QUALITY:: ADEQUATE
TRICHROME RESULT:: NONE SEEN

## 2021-11-07 LAB — FECAL LACTOFERRIN, QUANT
Fecal Lactoferrin: NEGATIVE
MICRO NUMBER:: 14061945
SPECIMEN QUALITY:: ADEQUATE

## 2021-11-14 ENCOUNTER — Ambulatory Visit (INDEPENDENT_AMBULATORY_CARE_PROVIDER_SITE_OTHER): Payer: No Typology Code available for payment source | Admitting: Physician Assistant

## 2021-11-14 ENCOUNTER — Other Ambulatory Visit: Payer: Self-pay | Admitting: Family

## 2021-11-14 ENCOUNTER — Encounter: Payer: Self-pay | Admitting: Physician Assistant

## 2021-11-14 ENCOUNTER — Other Ambulatory Visit (HOSPITAL_COMMUNITY): Payer: Self-pay

## 2021-11-14 ENCOUNTER — Encounter (HOSPITAL_COMMUNITY): Payer: Self-pay

## 2021-11-14 VITALS — BP 120/70 | HR 65 | Ht 60.0 in | Wt 125.6 lb

## 2021-11-14 DIAGNOSIS — R194 Change in bowel habit: Secondary | ICD-10-CM

## 2021-11-14 DIAGNOSIS — R1084 Generalized abdominal pain: Secondary | ICD-10-CM

## 2021-11-14 DIAGNOSIS — K529 Noninfective gastroenteritis and colitis, unspecified: Secondary | ICD-10-CM

## 2021-11-14 DIAGNOSIS — R110 Nausea: Secondary | ICD-10-CM

## 2021-11-14 MED ORDER — NA SULFATE-K SULFATE-MG SULF 17.5-3.13-1.6 GM/177ML PO SOLN
1.0000 | Freq: Once | ORAL | 0 refills | Status: AC
  Filled 2021-11-14: qty 354, 1d supply, fill #0

## 2021-11-14 NOTE — Patient Instructions (Signed)
You can try Miralax over the counter twice daily for constipation.   Continue Benty and Zofran as needed.   You have been scheduled for an endoscopy and colonoscopy. Please follow the written instructions given to you at your visit today. Please pick up your prep supplies at the pharmacy within the next 1-3 days. If you use inhalers (even only as needed), please bring them with you on the day of your procedure.  The [facility] providers would like to encourage you to use [facility] to communicate with providers for non-urgent requests or questions.  Due to long hold times on the telephone, sending your provider a message by [facility] may be a faster and more efficient way to get a response.  Please allow 48 business hours for a response.  Please remember that this is for non-urgent requests.   Due to recent changes in healthcare laws, you may see the results of your imaging and laboratory studies on MyChart before your provider has had a chance to review them.  We understand that in some cases there may be results that are confusing or concerning to you. Not all laboratory results come back in the same time frame and the provider may be waiting for multiple results in order to interpret others.  Please give [place] 48 hours in order for your provider to thoroughly review all the results before contacting the office for clarification of your results.   Thank you for choosing me and [facility].  Pricilla Riffle. Dagoberto Ligas., MD., Marval Regal

## 2021-11-14 NOTE — Progress Notes (Signed)
Chief Complaint: Follow-up after ER visit for abdominal pain  HPI:    Denise Rice is a 71 year old female with a past medical history as listed below including constipation, history of gastric bypass, history of small bowel obstruction, PCOS and multiple others, known to Dr. Loletha Carrow, who was referred to me by [name], MD for follow-up after being seen in the ER for abdominal pain.    10/12/2015 colonoscopy for screening with decreased sphincter tone, diverticulosis in the right colon, internal hemorrhoids and otherwise normal.  Repeat recommended in 10 years.    10/14/2021 patient seen in the ER for abdominal pain.  At that time presented with a 5-day history of lower abdominal pain, bloody diarrhea with strands of mucus several times a day, nausea and vomiting.  GI pathogen panel showed Campylobacter.  CBC and CMP within normal limits.  CTAP with contrast showed fluid throughout the large bowel which can be seen with diarrhea/enteritis, hyperattenuating material in the rectum which may be blood, no source of bleeding identified.  Postcholecystectomy changes which may be related to mild biliary dilation.  Question some debris in the bile duct.  Patient received Dilaudid, Bentyl, Morphine, Zofran and Azithromycin and resolved her abdominal pain and nausea.  Patient sent home with Azithromycin for 4 days and Bentyl twice daily as well as Zofran.    10/28/2021 patient seen by PCP for gastroenteritis.  Was having similar symptoms and has been seen in the ER and had a refill of Bentyl and Zofran.  A C. difficile was run as well as fecal lactoferrin, ova and parasite and Salmonella/Shigella/Campylobacter/Shiga toxin.    10/29/2021 stool studies were normal/negative.    12/08/2022 patient was feeling wonderful per phone notes.    Today, patient presents to clinic and tells me that she has always had a bad GI system.  She notes that she is lactose intolerant and has always had various abdominal pains and nausea as  well as radiation from constipation to diarrhea.  She has also had 2 separate bowel blockages.  Most acutely she had a Campylobacter infection as above, but over the past month she has now returned back to her chronic symptoms.  She tells me though that over the past few years she seems to have gotten worse.  Tells me she chronically radiates back and forth from diarrhea to constipation and if she is having good days she will wake up and have a good normal bowel, but typically at least 2 days out of the week she wakes up and has trouble.  She also experiences a lot of nausea in between the symptoms regardless of what she is eating she finds as needed Zofran and Bentyl helpful.  She does wonder if there is anything else she can do to make her life better.  Does describe an instance of fecal incontinence when waking up in the morning.    Denies fever, chills, weight loss, vomiting or symptoms that awaken her from sleep.  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    craniotomy for repair of R ear   Vertigo    Vitamin B12 deficiency 07/29/2018   Started IM injections monthly 06/2018   Vitamin D deficiency     Past Surgical History:  Procedure Laterality Date   BLEPHAROPLASTY  03-05-2000   CRANIOTOMY  08/2014   to repair dehescence in right ear   HAMMER TOE SURGERY Left 08-29-2013   2nd toe   IMPLANTATION BONE ANCHORED HEARING AID Left 07/27/2008   left temporal bone;now removed    LAPAROSCOPIC CHOLECYSTECTOMY  03-31-2005   LAPAROSCOPIC GASTRIC BANDING  02-08-2007   LAPAROSCOPY N/A 11/17/2017   Procedure: LAPAROSCOPY LYSIS OF ADHESIONS FOR SMALL BOWEL OBSTRUCTION;  Surgeon: Excell Seltzer, MD;  Location: [place];  Service: General;  Laterality: N/A;   LASER ABLATION CONDOLAMATA N/A 07/30/2012   Procedure: LASER ABLATION CONDOLAMATA;  Surgeon: Leighton Ruff, MD;  Location: [place];  Service: General;  Laterality: N/A;   LASIK Bilateral 2002   LYSIS OF ADHESION N/A 11/17/2017   Procedure: LYSIS OF ADHESION;  Surgeon: Excell Seltzer, MD;  Location: [place];  Service: General;  Laterality: N/A;   ROUX-EN-Y GASTRIC BYPASS  AUG 2013   [place];  1994 x2;  02-03-1998   STRABISMUS SURGERY Right    VAGINAL HYSTERECTOMY  2007   fibroids    Current Outpatient Medications  Medication Sig Dispense Refill   acetaminophen (TYLENOL) 325 MG tablet Take 325 mg by mouth daily as needed for headache.     busPIRone (BUSPAR) 7.5 MG tablet Take 1 tablet (7.5 mg total) by mouth 2 (two) times daily as needed (anxiety). 60 tablet 5   Cholecalciferol (VITAMIN D) 50 MCG (2000 UT) CAPS Take 2,000 Units by mouth daily.     Cod Liver Oil CAPS Take by mouth.     cyanocobalamin (VITAMIN B12) 1000 MCG/ML injection Inject 1 mL (1,000 mcg total) into the muscle every 30 (thirty) days. 10 mL 2   diazepam (VALIUM) 10 MG tablet Take 1 tablet (10 mg total) by mouth daily as needed for anxiety 30 tablet 1   dicyclomine (BENTYL) 20 MG tablet Take one tablet (20 mg dose) by mouth 2 (two) times daily. 20 tablet 0   ferrous sulfate 325 (65 FE) MG tablet Take 325 mg by mouth daily with breakfast.     mupirocin ointment (BACTROBAN) 2 % Apply to affected areas topically 2 (two) times daily. 22 g 0   ondansetron (ZOFRAN-ODT) 4 MG disintegrating tablet Dissolve 1 tablet (4 mg dose) by mouth every 8 (eight) hours as needed for nausea 12 tablet 0   Potassium (POTASSIMIN PO) Take  1 tablet by mouth daily.     Syringe/Needle, Disp, (SYRINGE 3CC/25GX1") 25G X 1" 3 ML MISC Use once monthly for injection of Vitamin B-12 50 each 0   traZODone (DESYREL) 100 MG tablet Take 1 tablet (100 mg total) by mouth at bedtime. 90 tablet 3   No current facility-administered medications for this visit.    Allergies as of 11/14/2021 - Review Complete 11/14/2021  Allergen Reaction Noted   Lactose Diarrhea 07/10/2020   Topiramate Nausea Only 01/22/2016   Alprazolam Nausea Only 08/15/2021    Family History  Problem Relation Age of Onset   Heart attack Mother    Heart disease Mother    Diabetes Mother    Hypertension Mother  Hyperlipidemia Mother    Stroke Mother    Kidney disease Mother    Thyroid disease Mother    Anxiety disorder Mother    Obesity Mother    Cancer - Prostate Father    Diabetes Father    Hypertension Father    Hyperlipidemia Father    Heart disease Father    Stroke Father    Kidney disease Father    Obesity Father    Stroke Sister    Diabetes Sister    High blood pressure Sister    Diabetes Maternal Aunt    Heart disease Maternal Aunt    Cancer Paternal Aunt    Cancer Paternal Uncle    Colon cancer Neg Hx    Colon polyps Neg Hx     Social History   Socioeconomic History   Marital status: Married    Spouse name: Vicenta Dunning    Number of children: 1   Years of education: MBA   Highest education level: Not on file  Occupational History   Occupation: Primary school teacher - retired    Fish farm manager: [employer]    Comment: [place]   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   Left handed.   Caffeine use: 2-3 cups (coffee) per day    Social Determinants of Health   Financial Resource Strain: Low Risk  (10/29/2021)   Overall Financial Resource Strain (CARDIA)    Difficulty of Paying Living Expenses: Not hard at all  Food Insecurity: No Food Insecurity (10/29/2021)   Hunger Vital Sign    Worried About Running Out of Food in the Last Year: Never true    Ran Out of Food in the Last Year: Never true  Transportation Needs: No Transportation Needs (10/29/2021)   PRAPARE - Hydrologist (Medical): No    Lack of Transportation (Non-Medical): No  Physical Activity: Sufficiently Active (10/29/2021)   Exercise Vital Sign    Days of Exercise per Week: 3 days    Minutes of Exercise per Session: 60 min  Stress: Stress Concern Present (10/29/2021)   [place]    Feeling of Stress : To some extent  Social Connections: Moderately Isolated (10/29/2021)   Social Connection and Isolation Panel [NHANES]    Frequency of Communication with Friends and Family: More than three times a week    Frequency of Social Gatherings with Friends and Family: More than three times a week    Attends Religious Services: Never    Marine scientist or Organizations: No    Attends Archivist Meetings: Never    Marital Status: Married  Human resources officer Violence: Not At Risk (10/29/2021)   Humiliation, Afraid, Rape, and Kick questionnaire    Fear of Current or Ex-Partner: No    Emotionally Abused: No    Physically Abused: No    Sexually Abused: No    Review of Systems:    Constitutional: No weight loss, fever or chills Skin: No rash  Cardiovascular: No chest pain   Respiratory: No SOB Gastrointestinal: See HPI and otherwise negative Genitourinary: No dysuria  Neurological: No headache, dizziness or syncope Musculoskeletal: No new muscle or joint  pain Hematologic: No bleeding  Psychiatric: No history of depression or anxiety   Physical Exam:  Vital signs: BP 120/70 (BP Location: Left Arm, Patient Position: Sitting, Cuff Size: Normal)   Pulse 65   Ht 5' (1.524 m)   Wt 125 lb 9.6 oz (57 kg)   LMP 01/13/2005   SpO2 98%   BMI 24.53 kg/m   Constitutional:   Pleasant Caucasian female appears to be in NAD, Well developed, Well nourished, alert and cooperative Head:  Normocephalic and atraumatic. Eyes:   PEERL, EOMI. No icterus. Conjunctiva pink. Ears:  Normal auditory acuity. Neck:  Supple Throat: Oral cavity and pharynx without inflammation, swelling or lesion.  Respiratory: Respirations even and unlabored. Lungs clear to auscultation bilaterally.   No wheezes, crackles, or rhonchi.  Cardiovascular: Normal S1, S2. No MRG. Regular rate and rhythm. No peripheral edema, cyanosis or pallor.  Gastrointestinal:  Soft, nondistended, nontender. No rebound or guarding. Normal bowel sounds. No appreciable masses or hepatomegaly. Rectal:  Not performed.  Msk:  Symmetrical without gross deformities. Without edema, no deformity or joint abnormality.  Neurologic:  Alert and  oriented x4;  grossly normal neurologically.  Skin:   Dry and intact without significant lesions or rashes. Psychiatric:  Demonstrates good judgement and reason without abnormal affect or behaviors.  RELEVANT LABS AND IMAGING: CBC    Component Value Date/Time   WBC 4.1 09/02/2021 1516   RBC 4.37 09/02/2021 1516   HGB 12.2 09/02/2021 1516   HGB 12.1 04/22/2021 1234   HGB 12.5 02/26/2018 1032   HCT 35.7 (L) 09/02/2021 1516   HCT 36.8 02/26/2018 1032   PLT 151.0 09/02/2021 1516   PLT 172 04/22/2021 1234   PLT 256 02/26/2018 1032   MCV 81.7 09/02/2021 1516   MCV 81 02/26/2018 1032   MCH 28.3 08/16/2021 0146   MCHC 34.3 09/02/2021 1516   RDW 13.5 09/02/2021 1516   RDW 14.3 02/26/2018 1032   LYMPHSABS 1.2 09/02/2021 1516   LYMPHSABS 1.6 02/26/2018 1032   MONOABS  0.3 09/02/2021 1516   EOSABS 0.0 09/02/2021 1516   EOSABS 0.1 02/26/2018 1032   BASOSABS 0.0 09/02/2021 1516   BASOSABS 0.0 02/26/2018 1032    CMP     Component Value Date/Time   NA 139 09/02/2021 1516   NA 140 08/03/2018 0853   K 4.1 09/02/2021 1516   CL 103 09/02/2021 1516   CO2 29 09/02/2021 1516   GLUCOSE 95 09/02/2021 1516   BUN 14 09/02/2021 1516   BUN 15 08/03/2018 0853   CREATININE 0.74 09/02/2021 1516   CREATININE 0.78 04/22/2021 1234   CREATININE 0.64 09/07/2015 1014   CALCIUM 8.5 09/02/2021 1516   CALCIUM 8.6 09/02/2021 1516   PROT 6.7 09/02/2021 1516   PROT 6.9 08/03/2018 0853   ALBUMIN 4.1 09/02/2021 1516   ALBUMIN 4.0 08/03/2018 0853   AST 33 09/02/2021 1516   AST 24 04/22/2021 1234   ALT 27 09/02/2021 1516   ALT 20 04/22/2021 1234   ALKPHOS 62 09/02/2021 1516   BILITOT 0.4 09/02/2021 1516   BILITOT 0.5 04/22/2021 1234   GFRNONAA >60 08/16/2021 0146   GFRNONAA >60 04/22/2021 1234   GFRAA >60 09/15/2019 1616    Assessment: 1.  Generalized abdominal pain: Cramping and discomfort, sometimes related with a bowel movement and other times  not, at least 2 days out of the week, worsened over the past year or so; consider IBS versus other 2.  Change in bowel habits: Radiates from diarrhea to constipation with occasional episode of fecal incontinence; consider IBS versus other 3.  Nausea: With all the above; consider relation to constipation/IBS versus functional dyspepsia versus gastritis  Plan: 1.  Patient would like to make sure there is nothing else she can be doing to help her symptoms.  Recommended that we go ahead and do a diagnostic EGD and colonoscopy to further evaluate and make sure there is no underlying cause other than most likely IBS which we discussed today.  She was scheduled for diagnostic EGD and colonoscopy in the [facility] with Dr. Loletha Carrow.  Did provide the patient a detailed list of risks for the procedures and she agrees to proceed. Patient is  appropriate for endoscopic procedure(s) in the ambulatory ([place]) setting.  2.  Patient can continue Bentyl and Zofran as needed. 3.  Currently patient tells me she is constipated, recommended MiraLAX up to 4 times a day as needed for constipation. 4.  Continue fiber supplement. 5.  Patient to follow in clinic per recommendations from Dr. Loletha Carrow after time of procedure.  Ellouise Newer, PA-C [facility] 11/14/2021, 8:33 AM  Cc: [name], MD

## 2021-11-14 NOTE — Progress Notes (Signed)
____________________________________________________________  Attending physician addendum:  Thank you for sending this case to me. I have reviewed the entire note and agree with the plan.  I will talk with her some more the day of her procedures to hopefully better characterize if the bowel habit difficulties predate the gastric bypass.  If so, then it is most likely a functional bowel disorder.  However, there could be an element of SIBO since she has had a GBP.  If no clear explanation on endoscopic procedures or biopsies, I will discuss with her a trial of empiric metronidazole or rifaximin.  Breath testing likely to be inaccurate given her altered intestinal anatomy.  Wilfrid Lund, MD  ____________________________________________________________

## 2021-11-15 ENCOUNTER — Other Ambulatory Visit (HOSPITAL_COMMUNITY): Payer: Self-pay

## 2021-11-20 ENCOUNTER — Encounter: Payer: Medicare HMO | Admitting: Family Medicine

## 2021-11-25 ENCOUNTER — Other Ambulatory Visit (HOSPITAL_COMMUNITY): Payer: Self-pay

## 2021-11-25 ENCOUNTER — Encounter: Payer: Self-pay | Admitting: Family Medicine

## 2021-11-26 ENCOUNTER — Other Ambulatory Visit: Payer: Self-pay | Admitting: Family Medicine

## 2021-11-26 ENCOUNTER — Other Ambulatory Visit (HOSPITAL_COMMUNITY): Payer: Self-pay

## 2021-11-27 ENCOUNTER — Other Ambulatory Visit (HOSPITAL_COMMUNITY): Payer: Self-pay

## 2021-11-27 MED ORDER — TRAZODONE HCL 100 MG PO TABS
100.0000 mg | ORAL_TABLET | Freq: Every day | ORAL | 3 refills | Status: DC
  Filled 2021-11-27: qty 135, 90d supply, fill #0

## 2021-11-27 MED ORDER — DIAZEPAM 10 MG PO TABS
10.0000 mg | ORAL_TABLET | Freq: Every day | ORAL | 5 refills | Status: DC
  Filled 2021-11-27: qty 30, 30d supply, fill #0
  Filled 2021-12-24: qty 30, 30d supply, fill #1
  Filled 2022-01-21: qty 30, 30d supply, fill #2
  Filled 2022-02-18: qty 30, 30d supply, fill #3
  Filled 2022-03-18: qty 30, 30d supply, fill #4
  Filled 2022-04-14: qty 30, 30d supply, fill #5

## 2021-11-27 NOTE — Telephone Encounter (Signed)
I reviewed patient's records from the PMP aware controlled substance registry today.   

## 2021-12-15 ENCOUNTER — Encounter: Payer: Self-pay | Admitting: Certified Registered Nurse Anesthetist

## 2021-12-16 ENCOUNTER — Encounter: Payer: No Typology Code available for payment source | Admitting: Gastroenterology

## 2021-12-17 ENCOUNTER — Encounter: Payer: No Typology Code available for payment source | Admitting: Gastroenterology

## 2021-12-18 ENCOUNTER — Ambulatory Visit (INDEPENDENT_AMBULATORY_CARE_PROVIDER_SITE_OTHER): Payer: No Typology Code available for payment source | Admitting: Family

## 2021-12-18 ENCOUNTER — Encounter: Payer: Self-pay | Admitting: Family

## 2021-12-18 ENCOUNTER — Telehealth: Payer: Self-pay | Admitting: Gastroenterology

## 2021-12-18 ENCOUNTER — Other Ambulatory Visit (HOSPITAL_BASED_OUTPATIENT_CLINIC_OR_DEPARTMENT_OTHER): Payer: Self-pay

## 2021-12-18 VITALS — BP 137/84 | HR 62 | Temp 97.6°F | Ht 60.0 in | Wt 125.0 lb

## 2021-12-18 DIAGNOSIS — B370 Candidal stomatitis: Secondary | ICD-10-CM

## 2021-12-18 DIAGNOSIS — U071 COVID-19: Secondary | ICD-10-CM | POA: Diagnosis not present

## 2021-12-18 LAB — POCT RAPID STREP A (OFFICE): Rapid Strep A Screen: NEGATIVE

## 2021-12-18 LAB — POC COVID19 BINAXNOW: SARS Coronavirus 2 Ag: POSITIVE — AB

## 2021-12-18 LAB — POCT INFLUENZA A/B
Influenza A, POC: NEGATIVE
Influenza B, POC: NEGATIVE

## 2021-12-18 MED ORDER — NYSTATIN 100000 UNIT/ML MT SUSP
5.0000 mL | Freq: Three times a day (TID) | OROMUCOSAL | 0 refills | Status: DC | PRN
  Filled 2021-12-18: qty 180, 6d supply, fill #0

## 2021-12-18 NOTE — Telephone Encounter (Signed)
Patient called has a double procedure scheduled for Friday. She said she just tested positive for Covid-19 at her PCP office. Seeking advise.

## 2021-12-18 NOTE — Progress Notes (Signed)
Patient ID: Denise Rice, female    DOB: [birth year], 71 y.o.   MRN: [id number]  Chief Complaint  Patient presents with   Cough    Pt c/o Dry cough/Throat, body aches, Fever 100.1  and  chest tightness, present since Sunday. Has not tried any medications. Has not been Covid tested.    HPI:      URI sx:  Pt c/o Dry cough/Throat, body aches, Fever 100.1  and  chest tightness, present since Sunday. Has not tried any medications. Also reports a "hairy tongue" with thick, dark green/black mucus on middle of tongue, she has been able to scrap a lot of it off.  Has not been Covid tested.   Assessment & Plan:  1. COVID-19 - rapid strep & flu neg. Offered antiviral, but advised may not be effective at this point, pt choosing to not take.  Advised of CDC guidelines for isolating for 5d & masking if out in public after 5d. OK to continue taking OTC sinus or pain meds. Encouraged to monitor & notify office of any worsening symptoms: increased shortness of breath, weakness, and signs of dehydration. Instructed to rest and hydrate well.   - POC COVID-19 - POCT Influenza A/B - POCT rapid strep A  2. Oral thrush -  dark green mucus-like substance on tongue, possibly covering hair, pt denies any recent abt or new medications, uses oral rinse daily, advised to stop while using magic mouthwash, and then resume qod at first. advised on use & SE of med.   - magic mouthwash (nystatin, lidocaine, diphenhydrAMINE, alum & mag hydroxide) suspension; Swish and spit 5-10 mLs three times daily as needed for mouth pain. OK to swallow if having throat pain also.  Dispense: 180 mL; Refill: 0   Subjective:    Outpatient Medications Prior to Visit  Medication Sig Dispense Refill   acetaminophen (TYLENOL) 325 MG tablet Take 325 mg by mouth daily as needed for headache.     busPIRone (BUSPAR) 7.5 MG tablet Take 1 tablet (7.5 mg total) by mouth 2 (two) times daily as needed (anxiety). 60 tablet 5   Cholecalciferol  (VITAMIN D) 50 MCG (2000 UT) CAPS Take 2,000 Units by mouth daily.     Cod Liver Oil CAPS Take by mouth.     cyanocobalamin (VITAMIN B12) 1000 MCG/ML injection Inject 1 mL (1,000 mcg total) into the muscle every 30 (thirty) days. 10 mL 2   diazepam (VALIUM) 10 MG tablet Take 1 tablet (10 mg total) by mouth daily as needed for anxiety 30 tablet 5   dicyclomine (BENTYL) 20 MG tablet Take one tablet (20 mg dose) by mouth 2 (two) times daily. 20 tablet 0   ferrous sulfate 325 (65 FE) MG tablet Take 325 mg by mouth daily with breakfast.     mupirocin ointment (BACTROBAN) 2 % Apply to affected areas topically 2 (two) times daily. 22 g 0   ondansetron (ZOFRAN-ODT) 4 MG disintegrating tablet Dissolve 1 tablet (4 mg dose) by mouth every 8 (eight) hours as needed for nausea 12 tablet 0   Potassium (POTASSIMIN PO) Take 1 tablet by mouth daily.     Syringe/Needle, Disp, (SYRINGE 3CC/25GX1") 25G X 1" 3 ML MISC Use once monthly for injection of Vitamin B-12 50 each 0   traZODone (DESYREL) 100 MG tablet Take 1 to 1 & 1/2 tablets by mouth at bedtime. 135 tablet 3   traZODone (DESYREL) 100 MG tablet Take 1 tablet (100 mg total) by mouth at  bedtime. 90 tablet 3   No facility-administered medications prior to visit.   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    craniotomy for repair of R ear   Vertigo    Vitamin B12 deficiency 07/29/2018   Started IM injections monthly 06/2018   Vitamin D deficiency     Past Surgical History:  Procedure Laterality Date   BLEPHAROPLASTY  03-05-2000   CRANIOTOMY  08/2014   to repair dehescence in right ear   HAMMER TOE SURGERY Left 08-29-2013   2nd toe   IMPLANTATION BONE ANCHORED HEARING AID Left 07/27/2008   left temporal bone;now removed   LAPAROSCOPIC CHOLECYSTECTOMY  03-31-2005   LAPAROSCOPIC GASTRIC BANDING  02-08-2007   LAPAROSCOPY N/A 11/17/2017   Procedure: LAPAROSCOPY LYSIS OF ADHESIONS FOR SMALL BOWEL OBSTRUCTION;  Surgeon: Excell Seltzer, MD;  Location: [facility];  Service: General;  Laterality: N/A;   LASER ABLATION CONDOLAMATA N/A 07/30/2012   Procedure: LASER ABLATION CONDOLAMATA;  Surgeon: Leighton Ruff, MD;  Location: [place];  Service: General;  Laterality: N/A;   LASIK Bilateral 2002   LYSIS OF ADHESION N/A 11/17/2017   Procedure: LYSIS OF ADHESION;  Surgeon: Excell Seltzer, MD;  Location: [place];  Service: General;  Laterality: N/A;   ROUX-EN-Y GASTRIC BYPASS  AUG 2013   [place];  1994 x2;  02-03-1998   STRABISMUS SURGERY Right    VAGINAL HYSTERECTOMY  2007   fibroids   Allergies  Allergen Reactions   Lactose Diarrhea   Topiramate Nausea Only   Alprazolam Nausea Only      Objective:    Physical Exam Vitals and nursing note reviewed.  Constitutional:      Appearance: Normal appearance. She is ill-appearing.     Interventions: Face mask in place.  HENT:     Right Ear: Tympanic membrane and ear canal normal.     Left Ear: Tympanic membrane and ear canal normal.     Nose:     Right Sinus: No frontal sinus tenderness.     Left Sinus: No frontal sinus tenderness.     Mouth/Throat:     Mouth: Mucous membranes are moist.     Tongue: Lesions (thick, dark green hair? mucus on middle of tongue) present.     Pharynx: Posterior oropharyngeal erythema (mild) present. No pharyngeal swelling, oropharyngeal exudate or uvula swelling.     Tonsils: No tonsillar exudate or tonsillar abscesses.   Cardiovascular:     Rate and Rhythm: Normal rate and regular rhythm.  Pulmonary:     Effort: Pulmonary effort is normal.     Breath sounds: Normal breath sounds.  Musculoskeletal:        General: Normal range of motion.  Lymphadenopathy:     Head:     Right side of head: No preauricular or posterior auricular adenopathy.     Left side of head: No  preauricular or posterior auricular adenopathy.     Cervical: No cervical adenopathy.  Skin:    General: Skin is warm and dry.  Neurological:     Mental Status: She is alert.  Psychiatric:        Mood and Affect: Mood normal.        Behavior: Behavior normal.    BP 137/84 (BP Location: Right Arm, Patient Position: Sitting, Cuff Size: Large)   Pulse 62   Temp 97.6 F (36.4 C) (Temporal)   Ht 5' (1.524 m)   Wt 125 lb (56.7 kg)   LMP 01/13/2005   SpO2 100%   BMI 24.41 kg/m  Wt Readings from Last 3 Encounters:  12/18/21 125 lb (56.7 kg)  11/14/21 125 lb 9.6 oz (57 kg)  10/28/21 119 lb 8 oz (54.2 kg)       Jeanie Sewer, NP

## 2021-12-18 NOTE — Patient Instructions (Addendum)
It was very nice to see you today!   Hydrate well, ok to take up to '600mg'$  Ibuprofen to help fever, aches, sore throat, sinus pressure. You can also take over the counter Robitussin for cough and generic Sudafed for your sinus symptoms twice a day. Drink plenty of water.  I have sent over a mouthwash to rinse with 3 times daily for your tongue thrush, ok to swallow every other time as it may help your sore throat also. Continue to gently brush your tongue daily and avoid using your mouth rinse that you have been using.       PLEASE NOTE:  If you had any lab tests please let us know if you have not heard back within a few days. You may see your results on MyChart before we have a chance to review them but we will give you a call once they are reviewed by [place]. If we ordered any referrals today, please let us know if you have not heard from their office within the next week.

## 2021-12-19 ENCOUNTER — Encounter: Payer: Self-pay | Admitting: Family

## 2021-12-19 ENCOUNTER — Emergency Department (HOSPITAL_COMMUNITY): Payer: No Typology Code available for payment source

## 2021-12-19 ENCOUNTER — Other Ambulatory Visit: Payer: Self-pay

## 2021-12-19 ENCOUNTER — Observation Stay (HOSPITAL_COMMUNITY)
Admission: EM | Admit: 2021-12-19 | Discharge: 2021-12-20 | Disposition: A | Discharge status: Home / Self Care | Payer: No Typology Code available for payment source | Attending: Internal Medicine | Admitting: Internal Medicine

## 2021-12-19 ENCOUNTER — Encounter (HOSPITAL_COMMUNITY): Payer: Self-pay

## 2021-12-19 DIAGNOSIS — I959 Hypotension, unspecified: Secondary | ICD-10-CM | POA: Diagnosis not present

## 2021-12-19 DIAGNOSIS — A419 Sepsis, unspecified organism: Secondary | ICD-10-CM | POA: Diagnosis not present

## 2021-12-19 DIAGNOSIS — I10 Essential (primary) hypertension: Secondary | ICD-10-CM | POA: Diagnosis not present

## 2021-12-19 DIAGNOSIS — F419 Anxiety disorder, unspecified: Secondary | ICD-10-CM | POA: Diagnosis present

## 2021-12-19 DIAGNOSIS — Z79899 Other long term (current) drug therapy: Secondary | ICD-10-CM | POA: Diagnosis not present

## 2021-12-19 DIAGNOSIS — J90 Pleural effusion, not elsewhere classified: Secondary | ICD-10-CM | POA: Diagnosis not present

## 2021-12-19 DIAGNOSIS — D696 Thrombocytopenia, unspecified: Secondary | ICD-10-CM | POA: Insufficient documentation

## 2021-12-19 DIAGNOSIS — R0902 Hypoxemia: Secondary | ICD-10-CM | POA: Diagnosis not present

## 2021-12-19 DIAGNOSIS — U071 COVID-19: Secondary | ICD-10-CM | POA: Diagnosis not present

## 2021-12-19 DIAGNOSIS — R531 Weakness: Secondary | ICD-10-CM | POA: Diagnosis present

## 2021-12-19 DIAGNOSIS — R001 Bradycardia, unspecified: Secondary | ICD-10-CM | POA: Diagnosis not present

## 2021-12-19 DIAGNOSIS — F329 Major depressive disorder, single episode, unspecified: Secondary | ICD-10-CM | POA: Diagnosis present

## 2021-12-19 DIAGNOSIS — J9811 Atelectasis: Secondary | ICD-10-CM | POA: Diagnosis not present

## 2021-12-19 DIAGNOSIS — B370 Candidal stomatitis: Secondary | ICD-10-CM | POA: Insufficient documentation

## 2021-12-19 LAB — URINALYSIS, ROUTINE W REFLEX MICROSCOPIC
Bilirubin Urine: NEGATIVE
Glucose, UA: NEGATIVE mg/dL
Hgb urine dipstick: NEGATIVE
Ketones, ur: NEGATIVE mg/dL
Leukocytes,Ua: NEGATIVE
Nitrite: NEGATIVE
Protein, ur: NEGATIVE mg/dL
Specific Gravity, Urine: 1.02 (ref 1.005–1.030)
pH: 5 (ref 5.0–8.0)

## 2021-12-19 LAB — CBC WITH DIFFERENTIAL/PLATELET
Abs Immature Granulocytes: 0.02 10*3/uL (ref 0.00–0.07)
Basophils Absolute: 0 10*3/uL (ref 0.0–0.1)
Basophils Relative: 0 %
Eosinophils Absolute: 0 10*3/uL (ref 0.0–0.5)
Eosinophils Relative: 0 %
HCT: 39.9 % (ref 36.0–46.0)
Hemoglobin: 12.9 g/dL (ref 12.0–15.0)
Immature Granulocytes: 1 %
Lymphocytes Relative: 29 %
Lymphs Abs: 0.9 10*3/uL (ref 0.7–4.0)
MCH: 28 pg (ref 26.0–34.0)
MCHC: 32.3 g/dL (ref 30.0–36.0)
MCV: 86.6 fL (ref 80.0–100.0)
Monocytes Absolute: 0.3 10*3/uL (ref 0.1–1.0)
Monocytes Relative: 9 %
Neutro Abs: 1.8 10*3/uL (ref 1.7–7.7)
Neutrophils Relative %: 61 %
Platelets: 143 10*3/uL — ABNORMAL LOW (ref 150–400)
RBC: 4.61 MIL/uL (ref 3.87–5.11)
RDW: 13.1 % (ref 11.5–15.5)
WBC: 3 10*3/uL — ABNORMAL LOW (ref 4.0–10.5)
nRBC: 0 % (ref 0.0–0.2)

## 2021-12-19 LAB — COMPREHENSIVE METABOLIC PANEL
ALT: 25 U/L (ref 0–44)
AST: 39 U/L (ref 15–41)
Albumin: 3.3 g/dL — ABNORMAL LOW (ref 3.5–5.0)
Alkaline Phosphatase: 57 U/L (ref 38–126)
Anion gap: 7 (ref 5–15)
BUN: 15 mg/dL (ref 8–23)
CO2: 25 mmol/L (ref 22–32)
Calcium: 7.4 mg/dL — ABNORMAL LOW (ref 8.9–10.3)
Chloride: 110 mmol/L (ref 98–111)
Creatinine, Ser: 0.94 mg/dL (ref 0.44–1.00)
GFR, Estimated: >60 mL/min (ref >60)
Glucose, Bld: 94 mg/dL (ref 70–99)
Potassium: 3.7 mmol/L (ref 3.5–5.1)
Sodium: 142 mmol/L (ref 135–145)
Total Bilirubin: 0.5 mg/dL (ref 0.3–1.2)
Total Protein: 6.4 g/dL — ABNORMAL LOW (ref 6.5–8.1)

## 2021-12-19 LAB — PROTIME-INR
INR: 1.1 (ref 0.8–1.2)
Prothrombin Time: 14 seconds (ref 11.4–15.2)

## 2021-12-19 LAB — LACTIC ACID, PLASMA
Lactic Acid, Venous: 0.7 mmol/L (ref 0.5–1.9)
Lactic Acid, Venous: 0.8 mmol/L (ref 0.5–1.9)

## 2021-12-19 MED ORDER — LACTATED RINGERS IV BOLUS
2000.0000 mL | Freq: Once | INTRAVENOUS | Status: AC
  Administered 2021-12-19: 2000 mL via INTRAVENOUS

## 2021-12-19 MED ORDER — DEXAMETHASONE SODIUM PHOSPHATE 10 MG/ML IJ SOLN
8.0000 mg | Freq: Once | INTRAMUSCULAR | Status: AC
  Administered 2021-12-19: 8 mg via INTRAVENOUS
  Filled 2021-12-19: qty 1

## 2021-12-19 MED ORDER — IOHEXOL 350 MG/ML SOLN
80.0000 mL | Freq: Once | INTRAVENOUS | Status: AC | PRN
  Administered 2021-12-19: 80 mL via INTRAVENOUS

## 2021-12-19 MED ORDER — KETOROLAC TROMETHAMINE 15 MG/ML IJ SOLN
15.0000 mg | Freq: Once | INTRAMUSCULAR | Status: AC
  Administered 2021-12-19: 15 mg via INTRAVENOUS
  Filled 2021-12-19: qty 1

## 2021-12-19 NOTE — ED Provider Triage Note (Signed)
Emergency Medicine Provider Triage Evaluation Note  LARISA LANIUS , a 71 y.o. female  was evaluated in triage.  Pt complains of difficulty staying awake, low oxygen and low blood pressure. Pt covid positive   Review of Systems  Positive: Fever and short of breath Negative:   Physical Exam  BP (!) 84/50   Pulse 63   Temp 98 F (36.7 C) (Oral)   Resp 20   LMP 01/13/2005   SpO2 100%  Gen:   Awake, no distress   Resp:  Normal effort  MSK:   Moves extremities without difficulty  Other:    Medical Decision Making  Medically screening exam initiated at 3:53 PM.  Appropriate orders placed.  JENNIPHER WEATHERHOLTZ was informed that the remainder of the evaluation will be completed by another provider, this initial triage assessment does not replace that evaluation, and the importance of remaining in the ED until their evaluation is complete.     Fransico Meadow, [place] 12/19/21 1554

## 2021-12-19 NOTE — ED Provider Notes (Signed)
[facility] DEPT Provider Note  CSN: [id number] Arrival date & time: 12/19/21 1529  Chief Complaint(s) Weakness and Hypotension  HPI Denise Rice is a 71 y.o. female with PMH anxiety, hearing loss, peptic ulcer disease, PCOS, vitamin B12 deficiency who presents emergency department for evaluation of generalized weakness, hypotension and hypoxia.  She states that she has experienced myalgias, generalized weakness over the last 3 days and tested positive for COVID yesterday.  She was found to be hypoxic at home to the mid 80s using a home pulse ox and used home external oxygen device to try and raise her saturations but EMS found the patient to be 88% on room air and hypotensive to 87/43.  Patient arrives hypertensive here to the emergency department with similar blood pressures but is saturating well on 2 L nasal cannula.  Denies nausea, vomiting, diarrhea, fever or other systemic symptoms.   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   IFG (impaired fasting glucose) 11/16/2020   Iron deficiency anemia 10/12/2020    Chronic migraine 05/04/2019   Encounter for counseling 03/08/2019   Vitamin B12 deficiency 07/29/2018   Anxiety 03/14/2018   [place] bodies following cataract surgery, 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   History of small bowel obstruction 11/17/2017   Chronic vertigo 03/11/2017   Major depression, chronic 03/11/2017   PCOS (polycystic ovarian syndrome) 03/11/2017   Insomnia 09/08/2015   Bilateral sensorineural hearing loss 09/06/2014   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 Authorizing Provider  acetaminophen (TYLENOL) 325 MG tablet Take 325 mg by mouth daily as needed for headache.    [provider]  busPIRone (BUSPAR) 7.5 MG tablet Take 1 tablet (7.5 mg total) by mouth 2 (two) times daily as needed (anxiety). 10/18/21   [name], MD  Cholecalciferol (VITAMIN D) 50 MCG (2000 UT) CAPS Take 2,000 Units by mouth daily.    [provider]  [facility] Liver Oil CAPS Take by mouth.    [provider]  cyanocobalamin (VITAMIN B12) 1000 MCG/ML injection Inject 1 mL (1,000 mcg total) into the muscle every 30 (thirty) days. 10/15/21   [name], MD  diazepam (VALIUM) 10 MG tablet Take 1 tablet (10 mg total) by mouth daily as needed for anxiety 11/27/21  [name], MD  dicyclomine (BENTYL) 20 MG tablet Take one tablet (20 mg dose) by mouth 2 (two) times daily. 10/28/21   Jeanie Sewer, NP  ferrous sulfate 325 (65 FE) MG tablet Take 325 mg by mouth daily with breakfast.    [provider]  magic mouthwash (nystatin, lidocaine, diphenhydrAMINE, alum & mag hydroxide) suspension Swish and spit 5-10 mLs three times daily as needed for mouth pain. OK to swallow if having throat pain also. 12/18/21   Jeanie Sewer, NP  mupirocin ointment (BACTROBAN) 2 % Apply to affected areas topically 2 (two) times daily. 09/02/21   [name], MD  ondansetron (ZOFRAN-ODT) 4 MG disintegrating tablet Dissolve 1 tablet (4 mg dose) by mouth every 8 (eight) hours as needed for nausea 10/28/21   Jeanie Sewer, NP  Potassium (POTASSIMIN PO) Take 1 tablet by mouth daily.    [provider]  Syringe/Needle, Disp, (SYRINGE 3CC/25GX1") 25G X 1" 3 ML MISC Use once monthly for injection of Vitamin B-12 06/28/21   Marin Olp, MD  traZODone (DESYREL) 100 MG tablet Take 1 to 1 & 1/2 tablets by mouth at bedtime. 11/27/21 11/27/22  [name], MD                                                                                                                                    Past Surgical History Past Surgical History:  Procedure Laterality Date   BLEPHAROPLASTY  03-05-2000   CRANIOTOMY  08/2014   to repair dehescence in right ear   HAMMER TOE SURGERY Left 08-29-2013   2nd toe   IMPLANTATION BONE ANCHORED HEARING AID Left 07/27/2008   left temporal bone;now removed   LAPAROSCOPIC CHOLECYSTECTOMY  03-31-2005   LAPAROSCOPIC GASTRIC BANDING  02-08-2007   LAPAROSCOPY N/A 11/17/2017   Procedure: LAPAROSCOPY LYSIS OF ADHESIONS FOR SMALL BOWEL OBSTRUCTION;  Surgeon: Excell Seltzer, MD;  Location: [facility] OR;  Service: General;  Laterality: N/A;   LASER ABLATION CONDOLAMATA N/A 07/30/2012   Procedure: LASER ABLATION CONDOLAMATA;  Surgeon: Leighton Ruff, MD;  Location: [facility];  Service: General;  Laterality: N/A;   LASIK Bilateral 2002   LYSIS OF ADHESION N/A 11/17/2017   Procedure: LYSIS OF ADHESION;  Surgeon: Excell Seltzer, MD;  Location: [facility];  Service: General;  Laterality: N/A;   ROUX-EN-Y GASTRIC BYPASS  AUG 2013   [place];  1994 x2;  02-03-1998   STRABISMUS SURGERY Right    VAGINAL HYSTERECTOMY  2007   fibroids   Family History Family History   Problem Relation Age of Onset   Heart attack Mother    Heart disease Mother    Diabetes Mother    Hypertension Mother    Hyperlipidemia Mother    Stroke Mother    Kidney disease Mother    Thyroid disease Mother    Anxiety disorder Mother    Obesity Mother  Cancer - Prostate Father    Diabetes Father    Hypertension Father    Hyperlipidemia Father    Heart disease Father    Stroke Father    Kidney disease Father    Obesity Father    Stroke Sister    Diabetes Sister    High blood pressure Sister    Diabetes Maternal Aunt    Heart disease Maternal Aunt    Cancer Paternal Aunt    Cancer Paternal Uncle    Colon cancer Neg Hx    Colon polyps Neg Hx     Social History Social History   Tobacco Use   Smoking status: Never   Smokeless tobacco: Never  Vaping Use   Vaping Use: Never used  Substance Use Topics   Alcohol use: No    Alcohol/week: 0.0 standard drinks of alcohol   Drug use: No   Allergies Lactose, Topiramate, and Alprazolam  Review of Systems Review of Systems  Respiratory:  Positive for cough and shortness of breath.   Musculoskeletal:  Positive for arthralgias and myalgias.    Physical Exam Vital Signs  I have reviewed the triage vital signs BP 128/78   Pulse 64   Temp 97.6 F (36.4 C)   Resp 18   Ht 5' (1.524 m)   Wt 56 kg   LMP 01/13/2005   SpO2 100%   BMI 24.11 kg/m   Physical Exam Vitals and nursing note reviewed.  Constitutional:      General: She is not in acute distress.    Appearance: She is well-developed.  HENT:     Head: Normocephalic and atraumatic.  Eyes:     Conjunctiva/sclera: Conjunctivae normal.  Cardiovascular:     Rate and Rhythm: Normal rate and regular rhythm.     Heart sounds: No murmur heard. Pulmonary:     Effort: Pulmonary effort is normal. No respiratory distress.  Musculoskeletal:        General: No swelling.     Cervical back: Neck supple.  Skin:    General: Skin is warm and dry.     Capillary  Refill: Capillary refill takes less than 2 seconds.  Neurological:     Mental Status: She is alert.  Psychiatric:        Mood and Affect: Mood normal.     ED Results and Treatments Labs (all labs ordered are listed, but only abnormal results are displayed) Labs Reviewed  COMPREHENSIVE METABOLIC PANEL - Abnormal; Notable for the following components:      Result Value   Calcium 7.4 (*)    Total Protein 6.4 (*)    Albumin 3.3 (*)    All other components within normal limits  CBC WITH DIFFERENTIAL/PLATELET - Abnormal; Notable for the following components:   WBC 3.0 (*)    Platelets 143 (*)    All other components within normal limits  CULTURE, BLOOD (ROUTINE X 2)  CULTURE, BLOOD (ROUTINE X 2)  LACTIC ACID, PLASMA  LACTIC ACID, PLASMA  PROTIME-INR  URINALYSIS, ROUTINE W REFLEX MICROSCOPIC  Radiology CT Angio Chest PE W and/or Wo Contrast  Result Date: 12/19/2021 CLINICAL DATA:  Pulmonary embolism (PE) suspected, high prob EXAM: CT ANGIOGRAPHY CHEST WITH CONTRAST TECHNIQUE: Multidetector CT imaging of the chest was performed using the standard protocol during bolus administration of intravenous contrast. Multiplanar CT image reconstructions and MIPs were obtained to evaluate the vascular anatomy. RADIATION DOSE REDUCTION: This exam was performed according to the departmental dose-optimization program which includes automated exposure control, adjustment of the mA and/or kV according to patient size and/or use of iterative reconstruction technique. CONTRAST:  66m OMNIPAQUE IOHEXOL 350 MG/ML SOLN COMPARISON:  None Available. FINDINGS: Cardiovascular: Adequate opacification of the pulmonary arterial tree. No intraluminal filling defect identified to suggest acute pulmonary embolism. Central pulmonary arteries are of normal caliber. No significant coronary artery  calcification. Cardiac size within normal limits. No pericardial effusion. Mild atherosclerotic calcification within the thoracic aorta. No aortic aneurysm. Mediastinum/Nodes: No enlarged mediastinal, hilar, or axillary lymph nodes. Thyroid gland, trachea, and esophagus demonstrate no significant findings. Lungs/Pleura: Mild bibasilar dependent atelectasis. Lungs are otherwise clear. No pneumothorax or pleural effusion. No central obstructing lesion. Upper Abdomen: Surgical changes of gastric bypass and cholecystectomy are identified. No acute abnormality. Musculoskeletal: Osseous structures are age-appropriate. No acute bone abnormality. Review of the MIP images confirms the above findings. IMPRESSION: 1. No pulmonary embolism. No acute intrathoracic pathology identified. No definite radiographic explanation for the patient's reported symptoms. Aortic Atherosclerosis (ICD10-I70.0). Electronically Signed   By: AFidela SalisburyM.D.   On: 12/19/2021 20:20   DG Chest 2 View  Result Date: 12/19/2021 CLINICAL DATA:  Sepsis EXAM: CHEST - 2 VIEW COMPARISON:  Chest x-ray 11/17/2017 FINDINGS: There is a trace left pleural effusion. There is no focal lung infiltrate, pleural effusion or pneumothorax. Cardiomediastinal silhouette is within normal limits. No acute fractures are seen. IMPRESSION: Trace left pleural effusion. No focal lung infiltrate. Electronically Signed   By: ARonney AstersM.D.   On: 12/19/2021 17:08    Pertinent labs & imaging results that were available during my care of the patient were reviewed by me and considered in my medical decision making (see MDM for details).  Medications Ordered in ED Medications  ketorolac (TORADOL) 15 MG/ML injection 15 mg (has no administration in time range)  lactated ringers bolus 2,000 mL (2,000 mLs Intravenous New Bag/Given 12/19/21 2049)  iohexol (OMNIPAQUE) 350 MG/ML injection 80 mL (80 mLs Intravenous Contrast Given 12/19/21 2000)                                                                                                                                      Procedures .Critical Care  Performed by: KTeressa Lower MD Authorized by: KTeressa Lower MD   Critical care provider statement:    Critical care time (minutes):  30   Critical care was necessary to treat or prevent imminent or life-threatening deterioration of the following conditions:  Respiratory failure and  circulatory failure   Critical care was time spent personally by me on the following activities:  Development of treatment plan with patient or surrogate, discussions with consultants, evaluation of patient's response to treatment, examination of patient, ordering and review of laboratory studies, ordering and review of radiographic studies, ordering and performing treatments and interventions, pulse oximetry, re-evaluation of patient's condition and review of old charts   (including critical care time)  Medical Decision Making / ED Course   This patient presents to the ED for concern of COVID-19, hypoxia, hypotension, this involves an extensive number of treatment options, and is a complaint that carries with it a high risk of complications and morbidity.  The differential diagnosis includes COVID-19, bacterial superinfection, PE, hypovolemic shock  MDM: Patient seen emergency room for evaluation of multiple complaints as described above.  Physical exam reveals an uncomfortable appearing patient but pulmonary exam and cardiac exam unremarkable.  No appreciable lower extremity edema.  Laboratory evaluation with a leukopenia to 3.0 consistent with her COVID diagnosis but is otherwise unremarkable.  Chest x-ray shows a trace pleural effusion but CT PE is reassuringly normal.  PE study obtained given new oxygen requirement with COVID-19.  Hypotension resolved with fluid resuscitation and patient require hospital admission for COVID hypoxia.  Decadron initiated.   Additional history  obtained: -Additional history obtained from husband -External records from outside source obtained and reviewed including: Chart review including previous notes, labs, imaging, consultation notes   Lab Tests: -I ordered, reviewed, and interpreted labs.   The pertinent results include:   Labs Reviewed  COMPREHENSIVE METABOLIC PANEL - Abnormal; Notable for the following components:      Result Value   Calcium 7.4 (*)    Total Protein 6.4 (*)    Albumin 3.3 (*)    All other components within normal limits  CBC WITH DIFFERENTIAL/PLATELET - Abnormal; Notable for the following components:   WBC 3.0 (*)    Platelets 143 (*)    All other components within normal limits  CULTURE, BLOOD (ROUTINE X 2)  CULTURE, BLOOD (ROUTINE X 2)  LACTIC ACID, PLASMA  LACTIC ACID, PLASMA  PROTIME-INR  URINALYSIS, ROUTINE W REFLEX MICROSCOPIC      EKG   EKG Interpretation  Date/Time:  Friday December 20 2021 01:05:29 EST Ventricular Rate:  62 PR Interval:  127 QRS Duration: 98 QT Interval:  456 QTC Calculation: 464 R Axis:   -6 Text Interpretation: Sinus rhythm Low voltage, precordial leads Confirmed by Ripley Fraise [phone]) on 12/20/2021 1:21:23 AM         Imaging Studies ordered: I ordered imaging studies including chest x-ray, CT PE I independently visualized and interpreted imaging. I agree with the radiologist interpretation   Medicines ordered and prescription drug management: Meds ordered this encounter  Medications   lactated ringers bolus 2,000 mL   iohexol (OMNIPAQUE) 350 MG/ML injection 80 mL   ketorolac (TORADOL) 15 MG/ML injection 15 mg    -I have reviewed the patients home medicines and have made adjustments as needed  Critical interventions Oxygen supplementation, IVF   Cardiac Monitoring: The patient was maintained on a cardiac monitor.  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

## 2021-12-19 NOTE — ED Notes (Signed)
Bp 81/47, NS started

## 2021-12-19 NOTE — H&P (Signed)
History and Physical    Denise Rice IRW:[id number] DOB: Jul 19, 1950 DOA: 12/19/2021  PCP: [name], MD  Patient coming from: Home  Chief Complaint: Generalized weakness  HPI: Denise Rice is a 71 y.o. female with medical history significant of anxiety, depression, hearing loss, PUD, iron deficiency anemia, PCOS, vertigo, Roux-en-Y gastric bypass, vitamin B12 deficiency, vitamin D deficiency, history of SBO. She was seen at PCPs office yesterday for URI symptoms and tested positive for COVID-19.  She was noted to have oral thrush and was prescribed Magic mouthwash.  Patient presents to the ED today via EMS for evaluation of ongoing URI symptoms. She was hypotensive with EMS (blood pressure 87/43) and was given a 500 cc fluid bolus.  Oxygen saturation 88% on room air and was placed on 2 L O2.  Vital signs on arrival to the ED: Temperature 98 F, pulse 54, respiratory rate 18, blood pressure 81/47, SpO2 98% on 2 L O2.  Labs showing WBC 3.0, platelet count 143k, calcium 7.4, albumin 3.3, LFTs normal x 2, lactic acid normal, blood cultures drawn, UA not suggestive of infection.  CT angiogram chest negative for PE or pneumonia. Patient was given Decadron and Toradol.  Blood pressure improved after 2 L LR boluses and [facility] called to admit.  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  She is vaccinated against COVID but feeling poorly for the past 2 days.  Endorsing fevers, chills, body aches, rhinorrhea, cough, and loss of appetite.  Denies shortness of breath or chest pain.  Denies vomiting, diarrhea, or abdominal pain.  No other complaints.  Review of Systems:  Review of Systems  All other systems reviewed and are negative.   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    craniotomy for repair of R ear   Vertigo    Vitamin B12 deficiency 07/29/2018   Started IM injections monthly 06/2018   Vitamin D deficiency     Past Surgical History:  Procedure Laterality Date   BLEPHAROPLASTY  03-05-2000   CRANIOTOMY  08/2014   to repair dehescence in right ear   HAMMER TOE SURGERY Left 08-29-2013   2nd toe   IMPLANTATION BONE ANCHORED HEARING AID Left 07/27/2008   left temporal bone;now removed   LAPAROSCOPIC CHOLECYSTECTOMY  03-31-2005   LAPAROSCOPIC GASTRIC BANDING  02-08-2007   LAPAROSCOPY N/A 11/17/2017   Procedure: LAPAROSCOPY LYSIS OF ADHESIONS FOR SMALL BOWEL OBSTRUCTION;  Surgeon: Excell Seltzer, MD;  Location: [place];  Service: General;  Laterality: N/A;   [place] N/A 07/30/2012   Procedure: LASER ABLATION CONDOLAMATA;  Surgeon: Leighton Ruff, MD;  Location: [place];  Service: General;  Laterality: N/A;   LASIK Bilateral 2002   LYSIS OF ADHESION N/A 11/17/2017   Procedure: LYSIS OF ADHESION;  Surgeon: Excell Seltzer,  Marland Kitchen, MD;  Location: [place];  Service: General;  Laterality: N/A;   ROUX-EN-Y GASTRIC BYPASS  AUG 2013   STAPEDES SURGERY Left 1985;  1994 x2;  02-03-1998   STRABISMUS SURGERY Right    VAGINAL HYSTERECTOMY  2007   fibroids     reports that she has never smoked. She has never used smokeless tobacco. She reports that she does not drink alcohol and does not use drugs.  Allergies  Allergen Reactions   Lactose Diarrhea   Topiramate Nausea Only   Alprazolam Nausea Only    Family History  Problem Relation Age of Onset   Heart attack Mother    Heart  disease Mother    Diabetes Mother    Hypertension Mother    Hyperlipidemia Mother    Stroke Mother    Kidney disease Mother    Thyroid disease Mother    Anxiety disorder Mother    Obesity Mother    Cancer - Prostate Father    Diabetes Father    Hypertension Father    Hyperlipidemia Father    Heart disease Father    Stroke Father    Kidney disease Father    Obesity Father    Stroke Sister    Diabetes Sister    High blood pressure Sister    Diabetes Maternal Aunt    Heart disease Maternal Aunt    Cancer Paternal Aunt    Cancer Paternal Uncle    Colon cancer Neg Hx    Colon polyps Neg Hx     Prior to Admission medications   Medication Sig Start Date End Date Taking? Authorizing Provider  acetaminophen (TYLENOL) 325 MG tablet Take 325 mg by mouth daily as needed for headache.    [provider]  busPIRone (BUSPAR) 7.5 MG tablet Take 1 tablet (7.5 mg total) by mouth 2 (two) times daily as needed (anxiety). 10/18/21   [name], MD  Cholecalciferol (VITAMIN D) 50 MCG (2000 UT) CAPS Take 2,000 Units by mouth daily.    [provider]  [facility] Liver Oil CAPS Take by mouth.    [provider]  cyanocobalamin (VITAMIN B12) 1000 MCG/ML injection Inject 1 mL (1,000 mcg total) into the muscle every 30 (thirty) days. 10/15/21   [name], MD  diazepam (VALIUM) 10 MG tablet Take 1 tablet (10 mg total) by mouth daily as needed for anxiety 11/27/21   [name], MD  dicyclomine (BENTYL) 20 MG tablet Take one tablet (20 mg dose) by mouth 2 (two) times daily. 10/28/21   Jeanie Sewer, NP  ferrous sulfate 325 (65 FE) MG tablet Take 325 mg by mouth daily with breakfast.    [provider]  magic mouthwash (nystatin, lidocaine, diphenhydrAMINE, alum & mag hydroxide) suspension Swish and spit 5-10 mLs three times daily as needed for mouth pain. OK to swallow if having throat pain also. 12/18/21   Jeanie Sewer, NP  mupirocin ointment (BACTROBAN)  2 % Apply to affected areas topically 2 (two) times daily. 09/02/21   [name], MD  ondansetron (ZOFRAN-ODT) 4 MG disintegrating tablet Dissolve 1 tablet (4 mg dose) by mouth every 8 (eight) hours as needed for nausea 10/28/21   Jeanie Sewer, NP  Potassium (POTASSIMIN PO) Take 1 tablet by mouth daily.    [provider]  Syringe/Needle, Disp, (SYRINGE 3CC/25GX1") 25G X 1" 3 ML MISC Use once monthly for injection of Vitamin B-12 06/28/21   Marin Olp, MD  traZODone (DESYREL) 100 MG  tablet Take 1 to 1 & 1/2 tablets by mouth at bedtime. 11/27/21 11/27/22  [name], MD    Physical Exam: Vitals:   12/19/21 2100 12/19/21 2106 12/19/21 2115 12/19/21 2130  BP: 128/78  124/68 (!) 142/96  Pulse: (!) 57 64 (!) 51 (!) 56  Resp: (!) 9 18 (!) 7 10  Temp:      TempSrc:      SpO2: 97% 100% 98% 100%  Weight:      Height:        Physical Exam Vitals reviewed.  Constitutional:      General: She is not in acute distress.    Comments: Appears lethargic  HENT:     Head: Normocephalic and atraumatic.  Eyes:     Extraocular Movements: Extraocular movements intact.  Cardiovascular:     Rate and Rhythm: Normal rate and regular rhythm.     Pulses: Normal pulses.  Pulmonary:     Effort: Pulmonary effort is normal. No respiratory distress.     Breath sounds: No wheezing or rales.  Abdominal:     General: Bowel sounds are normal. There is no distension.     Palpations: Abdomen is soft.     Tenderness: There is no abdominal tenderness.  Musculoskeletal:     Cervical back: Normal range of motion.     Right lower leg: No edema.     Left lower leg: No edema.  Skin:    General: Skin is warm and dry.  Neurological:     General: No focal deficit present.     Mental Status: She is alert and oriented to person, place, and time.     Labs on Admission: I have personally reviewed following labs and imaging studies  CBC: Recent Labs  Lab 12/19/21 1613  WBC 3.0*   NEUTROABS 1.8  HGB 12.9  HCT 39.9  MCV 86.6  PLT 062*   Basic Metabolic Panel: Recent Labs  Lab 12/19/21 1613  NA 142  K 3.7  CL 110  CO2 25  GLUCOSE 94  BUN 15  CREATININE 0.94  CALCIUM 7.4*   GFR: Estimated Creatinine Clearance: 44.3 mL/min (by C-G formula based on SCr of 0.94 mg/dL). Liver Function Tests: Recent Labs  Lab 12/19/21 1613  AST 39  ALT 25  ALKPHOS 57  BILITOT 0.5  PROT 6.4*  ALBUMIN 3.3*   No results for input(s): "LIPASE", "AMYLASE" in the last 168 hours. No results for input(s): "AMMONIA" in the last 168 hours. Coagulation Profile: Recent Labs  Lab 12/19/21 1613  INR 1.1   Cardiac Enzymes: No results for input(s): "CKTOTAL", "CKMB", "CKMBINDEX", "TROPONINI" in the last 168 hours. BNP (last 3 results) No results for input(s): "PROBNP" in the last 8760 hours. HbA1C: No results for input(s): "HGBA1C" in the last 72 hours. CBG: No results for input(s): "GLUCAP" in the last 168 hours. Lipid Profile: No results for input(s): "CHOL", "HDL", "LDLCALC", "TRIG", "CHOLHDL", "LDLDIRECT" in the last 72 hours. Thyroid Function Tests: No results for input(s): "TSH", "T4TOTAL", "FREET4", "T3FREE", "THYROIDAB" in the last 72 hours. Anemia Panel: No results for input(s): "VITAMINB12", "FOLATE", "FERRITIN", "TIBC", "IRON", "RETICCTPCT" in the last 72 hours. Urine analysis:    Component Value Date/Time   COLORURINE YELLOW 12/19/2021 2035   APPEARANCEUR CLEAR 12/19/2021 2035   LABSPEC 1.020 12/19/2021 2035   PHURINE 5.0 12/19/2021 2035   GLUCOSEU NEGATIVE 12/19/2021 2035   HGBUR NEGATIVE 12/19/2021 2035   BILIRUBINUR NEGATIVE 12/19/2021 2035   BILIRUBINUR n 02/25/2018 1134  KETONESUR NEGATIVE 12/19/2021 2035   PROTEINUR NEGATIVE 12/19/2021 2035   UROBILINOGEN 0.2 02/25/2018 1134   UROBILINOGEN 0.2 07/25/2008 1426   NITRITE NEGATIVE 12/19/2021 2035   LEUKOCYTESUR NEGATIVE 12/19/2021 2035    Radiological Exams on Admission: CT Angio Chest PE W  and/or Wo Contrast  Result Date: 12/19/2021 CLINICAL DATA:  Pulmonary embolism (PE) suspected, high prob EXAM: CT ANGIOGRAPHY CHEST WITH CONTRAST TECHNIQUE: Multidetector CT imaging of the chest was performed using the standard protocol during bolus administration of intravenous contrast. Multiplanar CT image reconstructions and MIPs were obtained to evaluate the vascular anatomy. RADIATION DOSE REDUCTION: This exam was performed according to the departmental dose-optimization program which includes automated exposure control, adjustment of the mA and/or kV according to patient size and/or use of iterative reconstruction technique. CONTRAST:  46m OMNIPAQUE IOHEXOL 350 MG/ML SOLN COMPARISON:  None Available. FINDINGS: Cardiovascular: Adequate opacification of the pulmonary arterial tree. No intraluminal filling defect identified to suggest acute pulmonary embolism. Central pulmonary arteries are of normal caliber. No significant coronary artery calcification. Cardiac size within normal limits. No pericardial effusion. Mild atherosclerotic calcification within the thoracic aorta. No aortic aneurysm. Mediastinum/Nodes: No enlarged mediastinal, hilar, or axillary lymph nodes. Thyroid gland, trachea, and esophagus demonstrate no significant findings. Lungs/Pleura: Mild bibasilar dependent atelectasis. Lungs are otherwise clear. No pneumothorax or pleural effusion. No central obstructing lesion. Upper Abdomen: Surgical changes of gastric bypass and cholecystectomy are identified. No acute abnormality. Musculoskeletal: Osseous structures are age-appropriate. No acute bone abnormality. Review of the MIP images confirms the above findings. IMPRESSION: 1. No pulmonary embolism. No acute intrathoracic pathology identified. No definite radiographic explanation for the patient's reported symptoms. Aortic Atherosclerosis (ICD10-I70.0). Electronically Signed   By: AFidela SalisburyM.D.   On: 12/19/2021 20:20   DG Chest 2  View  Result Date: 12/19/2021 CLINICAL DATA:  Sepsis EXAM: CHEST - 2 VIEW COMPARISON:  Chest x-ray 11/17/2017 FINDINGS: There is a trace left pleural effusion. There is no focal lung infiltrate, pleural effusion or pneumothorax. Cardiomediastinal silhouette is within normal limits. No acute fractures are seen. IMPRESSION: Trace left pleural effusion. No focal lung infiltrate. Electronically Signed   By: ARonney AstersM.D.   On: 12/19/2021 17:08    Assessment and Plan  COVID-19 viral infection Transient hypoxia? Patient is presenting with 2-day history of fevers, chills, body aches, rhinorrhea, cough, and loss of appetite.  She tested positive for COVID-19 at PCPs office yesterday.  Reportedly oxygen saturation 88% on room air with EMS but currently satting 98-100% on room air.  CT angiogram chest negative for PE or pneumonia.  WBC count 3.0, no fever or signs of sepsis.  Lactic acid normal x 2.  Hypotension likely from dehydration.  Blood pressure has now improved after 2.5 L IV fluid boluses. -Start Paxlovid -Patient received a dose of Decadron in the ED.  Do not think steroids need to be continued as she is not hypoxic at present. -Antitussive as needed -Tylenol as needed -Check inflammatory markers including ferritin, fibrinogen, D-dimer, CRP, LDH -Monitor CBC with differential -Airborne and contact precautions -Incentive spirometry, flutter valve -Continuous pulse ox -Supplemental oxygen as needed to keep oxygen saturation above 90% -Blood culture x2 pending   Mild bradycardia Heart rate 50-60 and sinus rhythm noted on monitor.  Blood pressure stable.  She is not on any AV nodal blocking agents. -Cardiac monitoring -Stat EKG -Check TSH  Hypocalcemia Calcium 8.0 when corrected for hypoalbuminemia. -Check ionized calcium, PTH, vitamin D, magnesium level  Mild thrombocytopenia Likely due  to acute viral infection.  Platelet count 143k. -Continue to monitor  Oral thrush -Continue  Magic mouthwash  Anxiety/depression -Continue home medications  DVT prophylaxis: Lovenox Code Status: DNR/DNI (discussed with the patient) Family Communication: No family available at this time. Level of care: Telemetry bed Admission status: It is my clinical opinion that referral for OBSERVATION is reasonable and necessary in this patient based on the above information provided. The aforementioned taken together are felt to place the patient at high risk for further clinical deterioration. However, it is anticipated that the patient may be medically stable for discharge from the hospital within 24 to 48 hours.   Denise Leff MD [facility]  If 7PM-7AM, please contact night-coverage [email or web address]  12/19/2021, 11:21 PM

## 2021-12-19 NOTE — ED Triage Notes (Signed)
BIB [facility] from home with weakness and bodyaches x3 days.  Covid + yesterday  Bp 87/43 with ems, admin 552m NS  88% on RA with ems, 2lpm  applied with ems.

## 2021-12-20 ENCOUNTER — Encounter (HOSPITAL_COMMUNITY): Payer: Self-pay | Admitting: Internal Medicine

## 2021-12-20 ENCOUNTER — Encounter: Payer: No Typology Code available for payment source | Admitting: Gastroenterology

## 2021-12-20 ENCOUNTER — Other Ambulatory Visit (HOSPITAL_COMMUNITY): Payer: Self-pay

## 2021-12-20 DIAGNOSIS — U071 COVID-19: Secondary | ICD-10-CM | POA: Diagnosis not present

## 2021-12-20 DIAGNOSIS — D696 Thrombocytopenia, unspecified: Secondary | ICD-10-CM | POA: Insufficient documentation

## 2021-12-20 DIAGNOSIS — R001 Bradycardia, unspecified: Secondary | ICD-10-CM | POA: Insufficient documentation

## 2021-12-20 DIAGNOSIS — B370 Candidal stomatitis: Secondary | ICD-10-CM | POA: Insufficient documentation

## 2021-12-20 DIAGNOSIS — R0902 Hypoxemia: Secondary | ICD-10-CM

## 2021-12-20 HISTORY — DX: Hypoxemia: R09.02

## 2021-12-20 LAB — LACTATE DEHYDROGENASE: LDH: 124 U/L (ref 98–192)

## 2021-12-20 LAB — C-REACTIVE PROTEIN: CRP: 0.5 mg/dL (ref <1.0)

## 2021-12-20 LAB — TSH: TSH: 0.776 u[IU]/mL (ref 0.350–4.500)

## 2021-12-20 LAB — FERRITIN: Ferritin: 28 ng/mL (ref 11–307)

## 2021-12-20 LAB — VITAMIN D 25 HYDROXY (VIT D DEFICIENCY, FRACTURES): Vit D, 25-Hydroxy: 39.19 ng/mL (ref 30–100)

## 2021-12-20 LAB — MAGNESIUM: Magnesium: 1.4 mg/dL — ABNORMAL LOW (ref 1.7–2.4)

## 2021-12-20 LAB — D-DIMER, QUANTITATIVE: D-Dimer, Quant: 0.59 ug/mL-FEU — ABNORMAL HIGH (ref 0.00–0.50)

## 2021-12-20 LAB — FIBRINOGEN: Fibrinogen: 365 mg/dL (ref 210–475)

## 2021-12-20 MED ORDER — NYSTATIN 100000 UNIT/ML MT SUSP: 5.0000 mL | Freq: Three times a day (TID) | OROMUCOSAL | Status: DC | PRN

## 2021-12-20 MED ORDER — DIAZEPAM 5 MG PO TABS: 10.0000 mg | ORAL_TABLET | Freq: Every day | ORAL | Status: DC | PRN

## 2021-12-20 MED ORDER — NIRMATRELVIR/RITONAVIR (PAXLOVID)TABLET: 3.0000 | ORAL_TABLET | Freq: Two times a day (BID) | ORAL | 0 refills | Status: AC

## 2021-12-20 MED ORDER — FLUCONAZOLE 200 MG PO TABS
200.0000 mg | ORAL_TABLET | Freq: Every day | ORAL | 0 refills | Status: AC
  Filled 2021-12-20: qty 5, 5d supply, fill #0

## 2021-12-20 MED ORDER — BUSPIRONE HCL 5 MG PO TABS: 7.5000 mg | ORAL_TABLET | Freq: Two times a day (BID) | ORAL | Status: DC | PRN

## 2021-12-20 MED ORDER — GUAIFENESIN-DM 100-10 MG/5ML PO SYRP: 5.0000 mL | ORAL_SOLUTION | ORAL | Status: DC | PRN

## 2021-12-20 MED ORDER — ENOXAPARIN SODIUM 40 MG/0.4ML IJ SOSY
40.0000 mg | PREFILLED_SYRINGE | INTRAMUSCULAR | Status: DC
  Administered 2021-12-20: 40 mg via SUBCUTANEOUS
  Filled 2021-12-20: qty 0.4

## 2021-12-20 MED ORDER — TRAZODONE HCL 100 MG PO TABS: 100.0000 mg | ORAL_TABLET | Freq: Every evening | ORAL | Status: DC | PRN

## 2021-12-20 MED ORDER — MAGNESIUM SULFATE 2 GM/50ML IV SOLN
2.0000 g | Freq: Once | INTRAVENOUS | Status: AC
  Administered 2021-12-20: 2 g via INTRAVENOUS
  Filled 2021-12-20: qty 50

## 2021-12-20 MED ORDER — NIRMATRELVIR/RITONAVIR (PAXLOVID)TABLET
3.0000 | ORAL_TABLET | Freq: Two times a day (BID) | ORAL | Status: DC
  Administered 2021-12-20: 3 via ORAL
  Filled 2021-12-20: qty 30

## 2021-12-20 MED ORDER — ACETAMINOPHEN 325 MG PO TABS: 650.0000 mg | ORAL_TABLET | Freq: Four times a day (QID) | ORAL | Status: DC | PRN

## 2021-12-20 MED ORDER — KETOROLAC TROMETHAMINE 15 MG/ML IJ SOLN: 15.0000 mg | Freq: Once | INTRAMUSCULAR | Status: DC

## 2021-12-20 MED ORDER — MAGIC MOUTHWASH W/LIDOCAINE: 5.0000 mL | Freq: Three times a day (TID) | ORAL | Status: DC | PRN

## 2021-12-20 MED ORDER — TRAMADOL HCL 50 MG PO TABS
50.0000 mg | ORAL_TABLET | Freq: Once | ORAL | Status: AC
  Administered 2021-12-20: 50 mg via ORAL
  Filled 2021-12-20: qty 1

## 2021-12-20 NOTE — Discharge Summary (Signed)
Physician Discharge Summary   SHAWNYA MAYOR KLK:[id number] DOB: [birth year] DOA: 12/19/2021  PCP: [name], MD  Admit date: 12/19/2021 Discharge date: 12/20/2021  Barriers to discharge: none  Admitted From: Home Disposition:  Home Discharging physician: Dwyane Dee, MD  Recommendations for Outpatient Follow-up:  Continue routine care  Home Health:  Equipment/Devices:   Discharge Condition: stable CODE STATUS: DNR Diet recommendation:  Diet Orders (From admission, onward)     Start     Ordered   12/20/21 0036  Diet regular Room service appropriate? Yes; Fluid consistency: Thin  Diet effective now       Question Answer Comment  Room service appropriate? Yes   Fluid consistency: Thin      12/20/21 0037   12/20/21 0000  Diet general        12/20/21 1112            Hospital Course: Denise Rice is a 70 y.o. female with medical history significant of anxiety, depression, hearing loss, PUD, iron deficiency anemia, PCOS, vertigo, Roux-en-Y gastric bypass, vitamin B12 deficiency, vitamin D deficiency, history of SBO.   She was evaluated outpatient at primary care on 12/18/2021 and tested positive for COVID-19.  She also was found to have some oral thrush and was placed on Magic mouthwash. She was trying to recover at home when she continued to feel lethargic with some associated hypotension.  She was also very mildly hypoxic and placed on oxygen.  She was brought to the ER for further evaluation. Hypoxia rapidly resolved and she was weaned to room air. CTA chest was also performed on workup which was negative for PE and revealed no underlying infiltrates or signs of overload. She initially was started on steroids which were not felt to be needed given rapid weaning back to room air. She was started on Paxlovid which was continued upon discharge to home as well. She ambulated the hallway independently with no oxygen desaturations appreciated. Patient was felt to be stable  for discharge home.   The patient's chronic medical conditions were treated accordingly per the patient's home medication regimen except as noted.  On day of discharge, patient was felt deemed stable for discharge. Patient/family member advised to call PCP or come back to ER if needed.   Principal Diagnosis: COVID-19 virus infection  Discharge Diagnoses: Active Hospital Problems   Diagnosis Date Noted   COVID-19 virus infection 12/19/2021   Hypoxia 12/20/2021   Sinus bradycardia 12/20/2021   Hypocalcemia 12/20/2021   Thrombocytopenia ([place]) 12/20/2021   Oral thrush 12/20/2021   Anxiety 03/14/2018   Major depression, chronic 03/11/2017    Resolved Hospital Problems  No resolved problems to display.     Discharge Instructions     Diet general   Complete by: As directed    Increase activity slowly   Complete by: As directed       Allergies as of 12/20/2021       Reactions   Lactose Diarrhea   Topiramate Nausea Only   Alprazolam Nausea Only        Medication List     STOP taking these medications    dicyclomine 20 MG tablet Commonly known as: BENTYL   ondansetron 4 MG disintegrating tablet Commonly known as: ZOFRAN-ODT       TAKE these medications    acetaminophen 325 MG tablet Commonly known as: TYLENOL Take 325 mg by mouth daily as needed for headache.   B-D 3CC LUER-LOK SYR 25GX1" 25G X 1" 3  ML Misc Generic drug: SYRINGE-NEEDLE (DISP) 3 ML Use once monthly for injection of Vitamin B-12   busPIRone 7.5 MG tablet Commonly known as: BUSPAR Take 1 tablet (7.5 mg total) by mouth 2 (two) times daily as needed (anxiety).   Cod Liver Oil Caps Take 1 capsule by mouth daily.   cyanocobalamin 1000 MCG/ML injection Commonly known as: VITAMIN B12 Inject 1 mL (1,000 mcg total) into the muscle every 30 (thirty) days.   diazepam 10 MG tablet Commonly known as: VALIUM Take 1 tablet (10 mg total) by mouth daily as needed for anxiety   ferrous sulfate 325  (65 FE) MG tablet Take 325 mg by mouth daily with breakfast.   fluconazole 200 MG tablet Commonly known as: Diflucan Take 1 tablet (200 mg total) by mouth daily for 5 days.   magic mouthwash (nystatin, lidocaine, diphenhydrAMINE, alum & mag hydroxide) suspension Swish and spit 5-10 mLs three times daily as needed for mouth pain. OK to swallow if having throat pain also.   mupirocin ointment 2 % Commonly known as: BACTROBAN Apply to affected areas topically 2 (two) times daily. What changed:  when to take this reasons to take this   nirmatrelvir/ritonavir EUA 20 x 150 MG & 10 x '100MG'$  Tabs Commonly known as: PAXLOVID Take 3 tablets by mouth 2 (two) times daily for 5 days. Patient GFR is >60. Take nirmatrelvir (150 mg) two tablets twice daily for 5 days and ritonavir (100 mg) one tablet twice daily for 5 days.   POTASSIMIN PO Take 1 tablet by mouth daily.   traZODone 100 MG tablet Commonly known as: DESYREL Take 1 to 1 & 1/2 tablets by mouth at bedtime. What changed:  when to take this reasons to take this   Vitamin D 50 MCG (2000 UT) Caps Take 2,000 Units by mouth daily.        Allergies  Allergen Reactions   Lactose Diarrhea   Topiramate Nausea Only   Alprazolam Nausea Only    Consultations:   Procedures:   Discharge Exam: BP 131/85   Pulse 60   Temp 97.7 F (36.5 C) (Oral)   Resp 14   Ht 5' (1.524 m)   Wt 56 kg   LMP 01/13/2005   SpO2 99%   BMI 24.11 kg/m  Physical Exam Constitutional:      Appearance: Normal appearance.  HENT:     Head: Normocephalic and atraumatic.     Mouth/Throat:     Mouth: Mucous membranes are moist.  Eyes:     Extraocular Movements: Extraocular movements intact.  Cardiovascular:     Rate and Rhythm: Normal rate and regular rhythm.  Pulmonary:     Effort: Pulmonary effort is normal. No respiratory distress.     Comments: Very minimal bibasilar crackles Abdominal:     General: Bowel sounds are normal. There is no  distension.     Palpations: Abdomen is soft.     Tenderness: There is no abdominal tenderness.  Musculoskeletal:        General: No swelling. Normal range of motion.     Cervical back: Normal range of motion and neck supple.  Skin:    General: Skin is warm and dry.  Neurological:     General: No focal deficit present.     Mental Status: She is alert and oriented to person, place, and time.  Psychiatric:        Mood and Affect: Mood normal.        Behavior: Behavior  normal.      The results of significant diagnostics from this hospitalization (including imaging, microbiology, ancillary and laboratory) are listed below for reference.   Microbiology: Recent Results (from the past 240 hour(s))  Culture, blood (Routine x 2)     Status: None (Preliminary result)   Collection Time: 12/19/21  4:13 PM   Specimen: BLOOD  Result Value Ref Range Status   Specimen Description   Final    BLOOD LEFT ANTECUBITAL Performed at [facility] [address], [place], [place] [address]    Special Requests   Final    BOTTLES DRAWN AEROBIC AND ANAEROBIC Blood Culture adequate volume Performed at [facility] [address] [place], [place] [address]    Culture   Final    NO GROWTH < 12 HOURS Performed at [facility] [address], [place], [place] [address]    Report Status PENDING  Incomplete  Culture, blood (Routine x 2)     Status: None (Preliminary result)   Collection Time: 12/19/21  8:36 PM   Specimen: BLOOD  Result Value Ref Range Status   Specimen Description   Final    BLOOD RIGHT ANTECUBITAL Performed at [facility] [address], [place], [place] [address]    Special Requests   Final    BOTTLES DRAWN AEROBIC AND ANAEROBIC Blood Culture results may not be optimal due to an inadequate volume of blood received in culture bottles Performed at [facility] [address], [place], [place]  [address]    Culture   Final    NO GROWTH < 12 HOURS Performed at [facility] [address], [place], [place] [address]    Report Status PENDING  Incomplete     Labs: BNP (last 3 results) No results for input(s): "BNP" in the last 8760 hours. Basic Metabolic Panel: Recent Labs  Lab 12/19/21 1613 12/20/21 0041  NA 142  --   K 3.7  --   CL 110  --   CO2 25  --   GLUCOSE 94  --   BUN 15  --   CREATININE 0.94  --   CALCIUM 7.4*  --   MG  --  1.4*   Liver Function Tests: Recent Labs  Lab 12/19/21 1613  AST 39  ALT 25  ALKPHOS 57  BILITOT 0.5  PROT 6.4*  ALBUMIN 3.3*   No results for input(s): "LIPASE", "AMYLASE" in the last 168 hours. No results for input(s): "AMMONIA" in the last 168 hours. CBC: Recent Labs  Lab 12/19/21 1613  WBC 3.0*  NEUTROABS 1.8  HGB 12.9  HCT 39.9  MCV 86.6  PLT 143*   Cardiac Enzymes: No results for input(s): "CKTOTAL", "CKMB", "CKMBINDEX", "TROPONINI" in the last 168 hours. BNP: Invalid input(s): "POCBNP" CBG: No results for input(s): "GLUCAP" in the last 168 hours. D-Dimer Recent Labs    12/20/21 0041  DDIMER 0.59*   Hgb A1c No results for input(s): "HGBA1C" in the last 72 hours. Lipid Profile No results for input(s): "CHOL", "HDL", "LDLCALC", "TRIG", "CHOLHDL", "LDLDIRECT" in the last 72 hours. Thyroid function studies Recent Labs    12/20/21 0041  TSH 0.776   Anemia work up Recent Labs    12/20/21 0041  FERRITIN 28   Urinalysis    Component Value Date/Time   COLORURINE YELLOW 12/19/2021 2035   APPEARANCEUR CLEAR 12/19/2021 2035   LABSPEC 1.020 12/19/2021 2035   PHURINE 5.0 12/19/2021 2035  GLUCOSEU NEGATIVE 12/19/2021 2035   HGBUR NEGATIVE 12/19/2021 2035   BILIRUBINUR NEGATIVE 12/19/2021 2035   BILIRUBINUR n 02/25/2018 1134   KETONESUR NEGATIVE 12/19/2021 2035   PROTEINUR NEGATIVE 12/19/2021 2035   UROBILINOGEN 0.2 02/25/2018 1134   UROBILINOGEN 0.2 07/25/2008 1426   NITRITE NEGATIVE  12/19/2021 2035   LEUKOCYTESUR NEGATIVE 12/19/2021 2035   Sepsis Labs Recent Labs  Lab 12/19/21 1613  WBC 3.0*   Microbiology Recent Results (from the past 240 hour(s))  Culture, blood (Routine x 2)     Status: None (Preliminary result)   Collection Time: 12/19/21  4:13 PM   Specimen: BLOOD  Result Value Ref Range Status   Specimen Description   Final    BLOOD LEFT ANTECUBITAL Performed at [facility], [place] [address] [place], [place] [address]    Special Requests   Final    BOTTLES DRAWN AEROBIC AND ANAEROBIC Blood Culture adequate volume Performed at [facility] [address] [place], [place] [address]    Culture   Final    NO GROWTH < 12 HOURS Performed at [facility] [address], [place], [place] [address]    Report Status PENDING  Incomplete  Culture, blood (Routine x 2)     Status: None (Preliminary result)   Collection Time: 12/19/21  8:36 PM   Specimen: BLOOD  Result Value Ref Range Status   Specimen Description   Final    BLOOD RIGHT ANTECUBITAL Performed at [facility] [address], [place], [place] [address]    Special Requests   Final    BOTTLES DRAWN AEROBIC AND ANAEROBIC Blood Culture results may not be optimal due to an inadequate volume of blood received in culture bottles Performed at [facility] [address] [place], [place] [address]    Culture   Final    NO GROWTH < 12 HOURS Performed at [facility] [address] [place], [place] [address]    Report Status PENDING  Incomplete    Procedures/Studies: CT Angio Chest PE W and/or Wo Contrast  Result Date: 12/19/2021 CLINICAL DATA:  Pulmonary embolism (PE) suspected, high prob EXAM: CT ANGIOGRAPHY CHEST WITH CONTRAST TECHNIQUE: Multidetector CT imaging of the chest was performed using the standard protocol during bolus administration of intravenous contrast. Multiplanar CT image reconstructions  and MIPs were obtained to evaluate the vascular anatomy. RADIATION DOSE REDUCTION: This exam was performed according to the departmental dose-optimization program which includes automated exposure control, adjustment of the mA and/or kV according to patient size and/or use of iterative reconstruction technique. CONTRAST:  75m OMNIPAQUE IOHEXOL 350 MG/ML SOLN COMPARISON:  None Available. FINDINGS: Cardiovascular: Adequate opacification of the pulmonary arterial tree. No intraluminal filling defect identified to suggest acute pulmonary embolism. Central pulmonary arteries are of normal caliber. No significant coronary artery calcification. Cardiac size within normal limits. No pericardial effusion. Mild atherosclerotic calcification within the thoracic aorta. No aortic aneurysm. Mediastinum/Nodes: No enlarged mediastinal, hilar, or axillary lymph nodes. Thyroid gland, trachea, and esophagus demonstrate no significant findings. Lungs/Pleura: Mild bibasilar dependent atelectasis. Lungs are otherwise clear. No pneumothorax or pleural effusion. No central obstructing lesion. Upper Abdomen: Surgical changes of gastric bypass and cholecystectomy are identified. No acute abnormality. Musculoskeletal: Osseous structures are age-appropriate. No acute bone abnormality. Review of the MIP images confirms the above findings. IMPRESSION: 1. No pulmonary embolism. No acute intrathoracic pathology identified. No definite radiographic explanation for the patient's reported symptoms. Aortic Atherosclerosis (ICD10-I70.0). Electronically  Signed   By: Fidela Salisbury M.D.   On: 12/19/2021 20:20   DG Chest 2 View  Result Date: 12/19/2021 CLINICAL DATA:  Sepsis EXAM: CHEST - 2 VIEW COMPARISON:  Chest x-ray 11/17/2017 FINDINGS: There is a trace left pleural effusion. There is no focal lung infiltrate, pleural effusion or pneumothorax. Cardiomediastinal silhouette is within normal limits. No acute fractures are seen. IMPRESSION: Trace  left pleural effusion. No focal lung infiltrate. Electronically Signed   By: Ronney Asters M.D.   On: 12/19/2021 17:08     Time coordinating discharge: Over 30 minutes    Dwyane Dee, MD  [facility] 12/20/2021, 4:12 PM

## 2021-12-20 NOTE — ED Notes (Signed)
Went in room to administer tramadol and patient resting at this time. Will reassess.

## 2021-12-20 NOTE — Hospital Course (Signed)
Denise Rice is a 71 y.o. female with medical history significant of anxiety, depression, hearing loss, PUD, iron deficiency anemia, PCOS, vertigo, Roux-en-Y gastric bypass, vitamin B12 deficiency, vitamin D deficiency, history of SBO.   She was evaluated outpatient at primary care on 12/18/2021 and tested positive for COVID-19.  She also was found to have some oral thrush and was placed on Magic mouthwash. She was trying to recover at home when she continued to feel lethargic with some associated hypotension.  She was also very mildly hypoxic and placed on oxygen.  She was brought to the ER for further evaluation. Hypoxia rapidly resolved and she was weaned to room air. CTA chest was also performed on workup which was negative for PE and revealed no underlying infiltrates or signs of overload. She initially was started on steroids which were not felt to be needed given rapid weaning back to room air. She was started on Paxlovid which was continued upon discharge to home as well. She ambulated the hallway independently with no oxygen desaturations appreciated. Patient was felt to be stable for discharge home.

## 2021-12-20 NOTE — ED Notes (Addendum)
Pt ambulated through the hallways with no staff assistance. Pt SpO2 maintained between 99-100, HR maintained in mid 70's, and RR 18-22. Pt stated she felt SOB and had a slightly unsteady gait. We paused for 10 seconds then continued to walk with no issues.

## 2021-12-21 LAB — PARATHYROID HORMONE, INTACT (NO CA): PTH: 33 pg/mL (ref 15–65)

## 2021-12-21 LAB — CALCIUM, IONIZED: Calcium, Ionized, Serum: 4.4 mg/dL — ABNORMAL LOW (ref 4.5–5.6)

## 2021-12-24 LAB — CULTURE, BLOOD (ROUTINE X 2)
Culture: NO GROWTH
Culture: NO GROWTH
Special Requests: ADEQUATE

## 2021-12-25 ENCOUNTER — Other Ambulatory Visit: Payer: Self-pay

## 2021-12-25 ENCOUNTER — Other Ambulatory Visit (HOSPITAL_COMMUNITY): Payer: Self-pay

## 2021-12-25 ENCOUNTER — Encounter: Payer: Self-pay | Admitting: Family

## 2021-12-25 ENCOUNTER — Encounter: Payer: Self-pay | Admitting: Gastroenterology

## 2021-12-25 DIAGNOSIS — Z6823 Body mass index (BMI) 23.0-23.9, adult: Secondary | ICD-10-CM | POA: Diagnosis not present

## 2021-12-25 DIAGNOSIS — F419 Anxiety disorder, unspecified: Secondary | ICD-10-CM | POA: Diagnosis not present

## 2021-12-25 DIAGNOSIS — D509 Iron deficiency anemia, unspecified: Secondary | ICD-10-CM | POA: Diagnosis not present

## 2021-12-25 DIAGNOSIS — F325 Major depressive disorder, single episode, in full remission: Secondary | ICD-10-CM | POA: Diagnosis not present

## 2021-12-25 DIAGNOSIS — Z79899 Other long term (current) drug therapy: Secondary | ICD-10-CM | POA: Diagnosis not present

## 2021-12-25 DIAGNOSIS — I700 Atherosclerosis of aorta: Secondary | ICD-10-CM | POA: Diagnosis not present

## 2021-12-25 DIAGNOSIS — D696 Thrombocytopenia, unspecified: Secondary | ICD-10-CM | POA: Diagnosis not present

## 2021-12-25 DIAGNOSIS — Z008 Encounter for other general examination: Secondary | ICD-10-CM | POA: Diagnosis not present

## 2021-12-25 DIAGNOSIS — G4700 Insomnia, unspecified: Secondary | ICD-10-CM | POA: Diagnosis not present

## 2022-01-03 ENCOUNTER — Other Ambulatory Visit (HOSPITAL_BASED_OUTPATIENT_CLINIC_OR_DEPARTMENT_OTHER): Payer: Self-pay

## 2022-01-13 ENCOUNTER — Other Ambulatory Visit: Payer: Self-pay

## 2022-01-13 ENCOUNTER — Other Ambulatory Visit (HOSPITAL_COMMUNITY): Payer: Self-pay

## 2022-01-14 ENCOUNTER — Other Ambulatory Visit (HOSPITAL_COMMUNITY): Payer: Self-pay

## 2022-01-14 ENCOUNTER — Other Ambulatory Visit: Payer: Self-pay

## 2022-01-15 ENCOUNTER — Other Ambulatory Visit: Payer: Self-pay

## 2022-01-15 ENCOUNTER — Other Ambulatory Visit (HOSPITAL_COMMUNITY): Payer: Self-pay

## 2022-01-15 ENCOUNTER — Encounter: Payer: Self-pay | Admitting: Family

## 2022-01-21 ENCOUNTER — Other Ambulatory Visit (HOSPITAL_COMMUNITY): Payer: Self-pay

## 2022-01-22 ENCOUNTER — Other Ambulatory Visit: Payer: Self-pay

## 2022-01-24 ENCOUNTER — Ambulatory Visit (INDEPENDENT_AMBULATORY_CARE_PROVIDER_SITE_OTHER): Payer: No Typology Code available for payment source | Admitting: Family Medicine

## 2022-01-24 VITALS — BP 126/64 | HR 69 | Temp 98.2°F | Ht 60.0 in | Wt 127.4 lb

## 2022-01-24 DIAGNOSIS — H838X3 Other specified diseases of inner ear, bilateral: Secondary | ICD-10-CM | POA: Diagnosis not present

## 2022-01-24 DIAGNOSIS — Z9884 Bariatric surgery status: Secondary | ICD-10-CM

## 2022-01-24 DIAGNOSIS — E538 Deficiency of other specified B group vitamins: Secondary | ICD-10-CM | POA: Diagnosis not present

## 2022-01-24 DIAGNOSIS — R7301 Impaired fasting glucose: Secondary | ICD-10-CM

## 2022-01-24 DIAGNOSIS — Z0000 Encounter for general adult medical examination without abnormal findings: Secondary | ICD-10-CM

## 2022-01-24 LAB — COMPREHENSIVE METABOLIC PANEL
ALT: 20 U/L (ref 0–35)
AST: 29 U/L (ref 0–37)
Albumin: 4.2 g/dL (ref 3.5–5.2)
Alkaline Phosphatase: 71 U/L (ref 39–117)
BUN: 17 mg/dL (ref 6–23)
CO2: 27 mEq/L (ref 19–32)
Calcium: 8.4 mg/dL (ref 8.4–10.5)
Chloride: 103 mEq/L (ref 96–112)
Creatinine, Ser: 0.75 mg/dL (ref 0.40–1.20)
GFR: 81.36 mL/min (ref >60.00)
Glucose, Bld: 92 mg/dL (ref 70–99)
Potassium: 3.9 mEq/L (ref 3.5–5.1)
Sodium: 141 mEq/L (ref 135–145)
Total Bilirubin: 0.4 mg/dL (ref 0.2–1.2)
Total Protein: 6.6 g/dL (ref 6.0–8.3)

## 2022-01-24 LAB — LIPID PANEL
Cholesterol: 137 mg/dL (ref 0–200)
HDL: 84.4 mg/dL (ref >39.00)
LDL Cholesterol: 42 mg/dL (ref 0–99)
NonHDL: 52.62
Total CHOL/HDL Ratio: 2
Triglycerides: 53 mg/dL (ref 0.0–149.0)
VLDL: 10.6 mg/dL (ref 0.0–40.0)

## 2022-01-24 LAB — CBC WITH DIFFERENTIAL/PLATELET
Basophils Absolute: 0 10*3/uL (ref 0.0–0.1)
Basophils Relative: 0.8 % (ref 0.0–3.0)
Eosinophils Absolute: 0 10*3/uL (ref 0.0–0.7)
Eosinophils Relative: 1 % (ref 0.0–5.0)
HCT: 37 % (ref 36.0–46.0)
Hemoglobin: 12.7 g/dL (ref 12.0–15.0)
Lymphocytes Relative: 30.8 % (ref 12.0–46.0)
Lymphs Abs: 1.4 10*3/uL (ref 0.7–4.0)
MCHC: 34.3 g/dL (ref 30.0–36.0)
MCV: 82.2 fl (ref 78.0–100.0)
Monocytes Absolute: 0.3 10*3/uL (ref 0.1–1.0)
Monocytes Relative: 6.9 % (ref 3.0–12.0)
Neutro Abs: 2.6 10*3/uL (ref 1.4–7.7)
Neutrophils Relative %: 60.5 % (ref 43.0–77.0)
Platelets: 189 10*3/uL (ref 150.0–400.0)
RBC: 4.5 Mil/uL (ref 3.87–5.11)
RDW: 14.1 % (ref 11.5–15.5)
WBC: 4.4 10*3/uL (ref 4.0–10.5)

## 2022-01-24 LAB — MAGNESIUM: Magnesium: 1.7 mg/dL (ref 1.5–2.5)

## 2022-01-24 LAB — HEMOGLOBIN A1C: Hgb A1c MFr Bld: 5.6 % (ref 4.6–6.5)

## 2022-01-24 LAB — VITAMIN B12: Vitamin B-12: 797 pg/mL (ref 211–911)

## 2022-01-24 MED ORDER — TRAZODONE HCL 100 MG PO TABS: 100.0000 mg | ORAL_TABLET | Freq: Every evening | ORAL | Status: DC | PRN

## 2022-01-24 NOTE — Progress Notes (Signed)
Subjective  Chief Complaint  Patient presents with   Annual Exam    Pt here for annual Exam and is not currently fasting     HPI: Denise Rice is a 72 y.o. female who presents to [facility] today for a Female Wellness Visit. She also has the concerns and/or needs as listed above in the chief complaint. These will be addressed in addition to the Health Maintenance Visit.   Wellness Visit: annual visit with health maintenance review and exam without Pap  Health maintenance: Mammogram is current.  Reviewed most recent bone density.  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  Exercises regularly.  Weight trains.  Colonoscopy scheduled for this month Chronic disease f/u and/or acute problem visit: (deemed necessary to be done in addition to the wellness visit): Recent COVID requiring hospitalization due to hypoxia.  Fortunately has recovered.  Reviewed lab work and hospitalization.  Need to follow-up on magnesium levels, calcium levels and blood count. Vitamin B12 deficiency needs follow-up.  Gets monthly injections. Mnire's chronic vertigo: Valium twice daily Overall she is feeling well.  Exercises, unfortunately her brother-in-law passed away prior to Christmas.  Husband is on dialysis.  Overall she is coping well.  No mood problems.  Assessment  1. Annual physical exam   2. 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 IFG (impaired fasting glucose)   5. Vitamin B12 deficiency   6. Hypomagnesemia      Plan  Female Wellness Visit: Age appropriate Health Maintenance and Prevention measures were discussed with patient. Included topics are cancer screening recommendations, ways to keep healthy (see AVS) including dietary and exercise recommendations, regular eye and dental care, use of seat belts, and avoidance of moderate alcohol use and tobacco use.  BMI: discussed patient's BMI and encouraged  positive lifestyle modifications to help get to or maintain a target BMI. HM needs and immunizations were addressed and ordered. See below for orders. See HM and immunization section for updates. Routine labs and screening tests ordered including cmp, cbc and lipids where appropriate. Discussed recommendations regarding Vit D and calcium supplementation (see AVS)  Chronic disease management visit and/or acute problem visit: Recheck lab work as noted above.  Chronic problems are stable.  Defer osteoporosis treatment.  Will recheck again in 2 years.  If worsening, will start.  Continue weight training, calcium and vitamin D Follow up: 12 months for complete physical Orders Placed This Encounter  Procedures   Lipid panel   Magnesium   Comprehensive metabolic panel   CBC with Differential/Platelet   Vitamin B12   Hemoglobin A1c   Meds ordered this encounter  Medications   traZODone (DESYREL) 100 MG tablet    Sig: Take 1-1.5 tablets (100-150 mg total) by mouth at bedtime as needed for sleep.      Body mass index is 24.88 kg/m. Wt Readings from Last 3 Encounters:  01/24/22 127 lb 6.4 oz (57.8 kg)  12/19/21 123 lb 7.3 oz (56 kg)  12/18/21 125 lb (56.7 kg)     Patient Active Problem List   Diagnosis Date Noted   Bilateral sensorineural hearing loss 09/06/2014    Priority: High   Chronic migraine 05/04/2019    Priority: Medium     Managed by Neurology    History of small bowel obstruction 11/17/2017    Priority: Medium    Chronic vertigo 03/11/2017    Priority: Medium  Major depression, chronic 03/11/2017    Priority: Medium    PCOS (polycystic ovarian syndrome) 03/11/2017    Priority: Medium    Insomnia 09/08/2015    Priority: Medium    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 06/25/2012    Priority: Medium    History of laparoscopic adjustable gastric banding 01/22/2011    Priority: Medium    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 Consider rX    IFG (impaired fasting glucose) 11/16/2020   Iron deficiency anemia 10/12/2020   Encounter for counseling 03/08/2019   Vitamin B12 deficiency 07/29/2018    Started IM injections monthly 06/2018    Anxiety 03/14/2018   [place] bodies following cataract surgery, 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   Health Maintenance  Topic Date Due   Zoster Vaccines- Shingrix (2 of 2) 07/04/2021   COVID-19 Vaccine (5 - 2023-24 season) 02/09/2022 (Originally 09/13/2021)   MAMMOGRAM  04/13/2022   Medicare Annual Wellness (AWV)  10/30/2022   DEXA SCAN  04/13/2023   COLONOSCOPY (Pts 45-50yr Insurance coverage will need to be confirmed)  10/11/2025   DTaP/Tdap/Td (3 - Td or Tdap) 08/13/2026   Pneumonia Vaccine 70 Years old  Completed   INFLUENZA VACCINE  Completed   Hepatitis C Screening  Completed   HPV VACCINES  Aged Out   Immunization History  Administered Date(s) Administered   Fluad Quad(high Dose 65+) 10/01/2018, 09/23/2019, 10/25/2020, 10/15/2021   Hepatitis A 08/01/2015, 02/06/2016   Hepatitis B 08/01/2015, 10/03/2015, 02/06/2016   Influenza-Unspecified 09/29/2014, 09/26/2016, 10/01/2018   PFIZER(Purple Top)SARS-COV-2 Vaccination 01/24/2019, 02/11/2019, 10/27/2019   Pfizer Covid-19 Vaccine Bivalent Booster 163yr& up 12/12/2020   Pneumococcal Conjugate-13 08/25/2014   Pneumococcal Polysaccharide-23 08/15/2013, 06/23/2018   Td 02/14/1995   Tdap 08/12/2016   Zoster Recombinat (Shingrix) 05/09/2021   Zoster, Live 05/19/2013   We updated and reviewed the patient's past history in detail and it is  documented below. Allergies: Patient is allergic to lactose, topiramate, and alprazolam. 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 Past Surgical History Patient  has a past surgical history that includes Vaginal hysterectomy (2007); Roux-en-Y Gastric Bypass (AUG 2013); Laparoscopic gastric banding (02-08-2007); Strabismus surgery (Right); Laparoscopic cholecystectomy (03-31-2005); Implantation bone anchored hearing aid (Left, 07/27/2008); Stapedes surgery (Left, 1985;  1994 x2;  02-03-1998); Blepharoplasty (03-05-2000); Laser ablation condolamata (N/A, 07/30/2012); Hammer toe surgery (Left, 08-29-2013); LASIK (Bilateral, 2002); Craniotomy (08/2014); laparoscopy (N/A, 11/17/2017); and Lysis of adhesion (N/A, 11/17/2017). Family History: Patient family history includes Anxiety disorder in her mother; Cancer in her paternal aunt and paternal uncle; Cancer - Prostate in her father; Diabetes in her father, maternal aunt, mother, and sister; Heart attack in her mother; Heart disease in her  father, maternal aunt, and mother; High blood pressure in her sister; Hyperlipidemia in her father and mother; Hypertension in her father and mother; Kidney disease in her father and mother; Obesity in her father and mother; Stroke in her father, mother, and sister; Thyroid disease in her mother. Social History:  Patient  reports that she has never smoked. She has never used smokeless tobacco. She reports  that she does not drink alcohol and does not use drugs.  Review of Systems: Constitutional: negative for fever or malaise Ophthalmic: negative for photophobia, double vision or loss of vision Cardiovascular: negative for chest pain, dyspnea on exertion, or new LE swelling Respiratory: negative for SOB or persistent cough Gastrointestinal: negative for abdominal pain, change in bowel habits or melena Genitourinary: negative for dysuria or gross hematuria, no abnormal uterine bleeding or disharge Musculoskeletal: negative for new gait disturbance or muscular weakness Integumentary: negative for new or persistent rashes, no breast lumps Neurological: negative for TIA or stroke symptoms Psychiatric: negative for SI or delusions Allergic/Immunologic: negative for hives  Patient Care Team    Relationship Specialty Notifications Start End  [name], MD PCP - General Family Medicine  10/14/21   Megan Salon, MD Consulting Physician Gynecology  03/11/17   Melvenia Beam, MD Consulting Physician Neurology  11/25/17   Eunice Blase, MD Consulting Physician Sports Medicine  05/04/19     Objective  Vitals: BP 126/64   Pulse 69   Temp 98.2 F (36.8 C)   Ht 5' (1.524 m)   Wt 127 lb 6.4 oz (57.8 kg)   LMP 01/13/2005   SpO2 94%   BMI 24.88 kg/m  General:  Well developed, well nourished, no acute distress  Psych:  Alert and orientedx3,normal mood and affect HEENT:  Normocephalic, atraumatic, non-icteric sclera,  supple neck without adenopathy, mass or thyromegaly Cardiovascular:  Normal S1, S2, RRR without gallop, rub or murmur Respiratory:  Good breath sounds bilaterally, CTAB with normal respiratory effort Gastrointestinal: normal bowel sounds, soft, non-tender, no noted masses. No HSM MSK: no deformities, contusions. Joints are without erythema or swelling.  Skin:  Warm, no rashes or suspicious lesions noted Neurologic:    Mental status is normal. Gross motor and sensory exams are  normal. Normal gait. No tremor   Commons side effects, risks, benefits, and alternatives for medications and treatment plan prescribed today were discussed, and the patient expressed understanding of the given instructions. Patient is instructed to call or message via MyChart if he/she has any questions or concerns regarding our treatment plan. No barriers to understanding were identified. We discussed Red Flag symptoms and signs in detail. Patient expressed understanding regarding what to do in case of urgent or emergency type symptoms.  Medication list was reconciled, printed and provided to the patient in AVS. Patient instructions and summary information was reviewed with the patient as documented in the AVS. This note was prepared with assistance of Dragon voice recognition software. Occasional wrong-word or sound-a-like substitutions may have occurred due to the inherent limitations of voice recognition software

## 2022-01-24 NOTE — Patient Instructions (Signed)
Please return in 12 months for your annual complete physical; please come fasting.   I will release your lab results to you on your MyChart account with further instructions. You may see the results before I do, but when I review them I will send you a message with my report or have my assistant call you if things need to be discussed. Please reply to my message with any questions. Thank you!   If you have any questions or concerns, please don't hesitate to send me a message via MyChart or call the office at [phone]. Thank you for visiting with [place] today! It's our pleasure caring for you.

## 2022-02-05 ENCOUNTER — Encounter: Payer: Self-pay | Admitting: Certified Registered Nurse Anesthetist

## 2022-02-12 ENCOUNTER — Ambulatory Visit: Payer: No Typology Code available for payment source | Admitting: Gastroenterology

## 2022-02-12 ENCOUNTER — Encounter: Payer: Self-pay | Admitting: Gastroenterology

## 2022-02-12 ENCOUNTER — Other Ambulatory Visit (HOSPITAL_COMMUNITY): Payer: Self-pay

## 2022-02-12 VITALS — BP 137/87 | HR 68 | Temp 98.0°F | Resp 11

## 2022-02-12 DIAGNOSIS — Z9884 Bariatric surgery status: Secondary | ICD-10-CM

## 2022-02-12 DIAGNOSIS — R1084 Generalized abdominal pain: Secondary | ICD-10-CM | POA: Diagnosis not present

## 2022-02-12 DIAGNOSIS — R194 Change in bowel habit: Secondary | ICD-10-CM | POA: Diagnosis not present

## 2022-02-12 DIAGNOSIS — R140 Abdominal distension (gaseous): Secondary | ICD-10-CM | POA: Diagnosis not present

## 2022-02-12 DIAGNOSIS — F419 Anxiety disorder, unspecified: Secondary | ICD-10-CM | POA: Diagnosis not present

## 2022-02-12 DIAGNOSIS — R109 Unspecified abdominal pain: Secondary | ICD-10-CM

## 2022-02-12 DIAGNOSIS — D122 Benign neoplasm of ascending colon: Secondary | ICD-10-CM | POA: Diagnosis not present

## 2022-02-12 DIAGNOSIS — K56609 Unspecified intestinal obstruction, unspecified as to partial versus complete obstruction: Secondary | ICD-10-CM

## 2022-02-12 DIAGNOSIS — K639 Disease of intestine, unspecified: Secondary | ICD-10-CM | POA: Diagnosis not present

## 2022-02-12 MED ORDER — SODIUM CHLORIDE 0.9 % IV SOLN: 500.0000 mL | Freq: Once | INTRAVENOUS | Status: DC

## 2022-02-12 NOTE — Progress Notes (Signed)
0903 Robinul 0.1 mg IV given due large amount of secretions upon assessment.  MD made aware, vss

## 2022-02-12 NOTE — Progress Notes (Signed)
Report given to PACU, vss 

## 2022-02-12 NOTE — Progress Notes (Signed)
Pt's states no medical or surgical changes since previsit or office visit. 

## 2022-02-12 NOTE — Progress Notes (Signed)
History and Physical:  This patient presents for endoscopic testing for: Encounter Diagnoses  Name Primary?   Change in bowel habits Yes   Generalized abdominal pain     72 year old woman here for endoscopic evaluation of chronic digestive symptoms noted above.  Clinical details in 11/14/2021 office consult note.  Chronic abdominal pain, bloating, altered bowel habits and nausea.  Previous gastric bypass and subsequent bowel obstructions, at least 1 episode requiring lysis of adhesions.  No prior testing or treatment for SIBO.  Patient is otherwise without complaints or active issues today.   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    craniotomy for repair of R ear   Vertigo    Vitamin B12 deficiency 07/29/2018   Started IM injections monthly 06/2018   Vitamin D deficiency      Past Surgical History: Past Surgical History:  Procedure Laterality Date   BLEPHAROPLASTY  03-05-2000   CRANIOTOMY  08/2014   to repair dehescence in right ear   HAMMER TOE SURGERY Left 08-29-2013   2nd toe   IMPLANTATION BONE ANCHORED HEARING AID Left 07/27/2008   left temporal bone;now removed   LAPAROSCOPIC CHOLECYSTECTOMY  03-31-2005   LAPAROSCOPIC GASTRIC BANDING  02-08-2007   LAPAROSCOPY N/A 11/17/2017   Procedure: LAPAROSCOPY LYSIS OF ADHESIONS FOR SMALL BOWEL OBSTRUCTION;  Surgeon: Excell Seltzer, MD;  Location: [facility];  Service: General;   Laterality: N/A;   LASER ABLATION CONDOLAMATA N/A 07/30/2012   Procedure: LASER ABLATION CONDOLAMATA;  Surgeon: Leighton Ruff, MD;  Location: [facility];  Service: General;  Laterality: N/A;   LASIK Bilateral 2002   LYSIS OF ADHESION N/A 11/17/2017   Procedure: LYSIS OF ADHESION;  Surgeon: Excell Seltzer, MD;  Location: [facility];  Service: General;  Laterality: N/A;   ROUX-EN-Y GASTRIC BYPASS  AUG 2013   [place];  1994 x2;  02-03-1998   STRABISMUS SURGERY Right    VAGINAL HYSTERECTOMY  2007   fibroids    Allergies: Allergies  Allergen Reactions   Lactose Diarrhea   Topiramate Nausea Only   Alprazolam Nausea Only    Outpatient Meds: Current Outpatient Medications  Medication Sig Dispense Refill   busPIRone (BUSPAR) 7.5 MG tablet Take 1 tablet (7.5 mg total) by mouth 2 (two) times daily as needed (anxiety). 60 tablet 5   Cholecalciferol (VITAMIN D) 50 MCG (2000 UT) CAPS Take 2,000 Units by mouth daily.     Cod Liver Oil CAPS Take 1 capsule by mouth daily.     cyanocobalamin (VITAMIN B12) 1000 MCG/ML injection Inject 1 mL (1,000 mcg total) into the muscle every 30 (thirty) days. 10 mL 2   Potassium (POTASSIMIN PO) Take 1 tablet by mouth daily.     traZODone (DESYREL) 100 MG tablet Take 1-1.5 tablets (100-150 mg total) by mouth at bedtime as needed for sleep.  acetaminophen (TYLENOL) 325 MG tablet Take 325 mg by mouth daily as needed for headache.     diazepam (VALIUM) 10 MG tablet Take 1 tablet (10 mg total) by mouth daily as needed for anxiety 30 tablet 5   ferrous sulfate 325 (65 FE) MG tablet Take 325 mg by mouth daily with breakfast.     Syringe/Needle, Disp, (SYRINGE 3CC/25GX1") 25G X 1" 3 ML MISC Use once monthly for injection of Vitamin B-12 50 each 0   Current Facility-Administered Medications  Medication Dose Route Frequency Provider Last Rate Last Admin   0.9 %  sodium chloride infusion  500 mL Intravenous Once Doran Stabler, MD           ___________________________________________________________________ Objective   Exam:  Temp 98 F (36.7 C)   LMP 01/13/2005   CV: regular , S1/S2 Resp: clear to auscultation bilaterally, normal RR and effort noted GI: soft, no tenderness, with active bowel sounds.   Assessment: Encounter Diagnoses  Name Primary?   Change in bowel habits Yes   Generalized abdominal pain      Plan: Colonoscopy EGD  The benefits and risks of the planned procedure were described in detail with the patient or (when appropriate) their health care proxy.  Risks were outlined as including, but not limited to, bleeding, infection, perforation, adverse medication reaction leading to cardiac or pulmonary decompensation, pancreatitis (if ERCP).  The limitation of incomplete mucosal visualization was also discussed.  No guarantees or warranties were given.    The patient is appropriate for an endoscopic procedure in the ambulatory setting.   - Wilfrid Lund, MD

## 2022-02-12 NOTE — Op Note (Signed)
[name] Procedure Date: 02/12/2022 9:03 AM MRN: [id number] Endoscopist: Ponshewaing. Loletha Carrow , MD, [id number] Age: 72 Referring MD:  Date of Birth: [birth year] Gender: Female Account #: [id number] Procedure:                Upper GI endoscopy Indications:              Generalized abdominal pain, Abdominal bloating,                            Altered bowel habits                           Recurrent SBO requiring LOA Medicines:                Monitored Anesthesia Care Procedure:                Pre-Anesthesia Assessment:                           - Prior to the procedure, a History and Physical                            was performed, and patient medications and                            allergies were reviewed. The patient's tolerance of                            previous anesthesia was also reviewed. The risks                            and benefits of the procedure and the sedation                            options and risks were discussed with the patient.                            All questions were answered, and informed consent                            was obtained. Prior Anticoagulants: The patient has                            taken no anticoagulant or antiplatelet agents. ASA                            Grade Assessment: II - A patient with mild systemic                            disease. After reviewing the risks and benefits,                            the patient was deemed in satisfactory condition to  undergo the procedure.                           After obtaining informed consent, the endoscope was                            passed under direct vision. Throughout the                            procedure, the patient's blood pressure, pulse, and                            oxygen saturations were monitored continuously. The                            GIF [id number] #[id number] was introduced through the                             mouth, and advanced to the jejunum. The upper GI                            endoscopy was accomplished without difficulty. The                            patient tolerated the procedure well. Scope In: Scope Out: Findings:                 The larynx was normal.                           The esophagus was normal.                           Evidence of a gastric bypass was found. A gastric                            pouch with a normal size was found. The                            gastrojejunal anastomosis was characterized by                            healthy appearing mucosa. This was easily                            traversed. Short blind limb. Scope easily passed to                            jejunum to the entire length of the endoscope. The                            jejunojejunal anastomosis was not encountered.                           Normal mucosa was found in the jejunum. Biopsies  for histology were taken with a cold forceps for                            evaluation of celiac disease. Complications:            No immediate complications. Estimated Blood Loss:     Estimated blood loss was minimal. Impression:               - Normal larynx.                           - Normal esophagus.                           - Gastric bypass with a normal-sized pouch.                            Gastrojejunal anastomosis characterized by healthy                            appearing mucosa.                           - Normal mucosa was found in the jejunum. Biopsied. Recommendation:           - Patient has a contact number available for                            emergencies. The signs and symptoms of potential                            delayed complications were discussed with the                            patient. Return to normal activities tomorrow.                            Written discharge instructions were provided to the                             patient.                           - Resume previous diet.                           - Continue present medications.                           - See the other procedure note for documentation of                            additional recommendations. [name] L. Loletha Carrow, MD 02/12/2022 9:56:26 AM This report has been signed electronically.

## 2022-02-12 NOTE — Patient Instructions (Addendum)
Recommendation:Patient has a contact number available for                            emergencies. The signs and symptoms of potential                            delayed complications were discussed with the                            patient. Return to normal activities tomorrow.                            Written discharge instructions were provided to the                            patient.                           - Resume previous diet.                           - Continue present medications.                           - See the other procedure note for documentation of                            additional recommendations.  Handouts on polyps and diverticulosis given.  YOU HAD AN ENDOSCOPIC PROCEDURE TODAY AT [facility]:   Refer to the procedure report that was given to you for any specific questions about what was found during the examination.  If the procedure report does not answer your questions, please call your gastroenterologist to clarify.  If you requested that your care partner not be given the details of your procedure findings, then the procedure report has been included in a sealed envelope for you to review at your convenience later.  YOU SHOULD EXPECT: Some feelings of bloating in the abdomen. Passage of more gas than usual.  Walking can help get rid of the air that was put into your GI tract during the procedure and reduce the bloating. If you had a lower endoscopy (such as a colonoscopy or flexible sigmoidoscopy) you may notice spotting of blood in your stool or on the toilet paper. If you underwent a bowel prep for your procedure, you may not have a normal bowel movement for a few days.  Please Note:  You might notice some irritation and congestion in your nose or some drainage.  This is from the oxygen used during your procedure.  There is no need for concern and it should clear up in a day or so.  SYMPTOMS TO REPORT IMMEDIATELY:  Following lower  endoscopy (colonoscopy or flexible sigmoidoscopy):  Excessive amounts of blood in the stool  Significant tenderness or worsening of abdominal pains  Swelling of the abdomen that is new, acute  Fever of 100F or higher  Following upper endoscopy (EGD)  Vomiting of blood or coffee ground material  New chest pain or pain under the shoulder blades  Painful or persistently difficult swallowing  New shortness of breath  Fever  of 100F or higher  Black, tarry-looking stools  For urgent or emergent issues, a gastroenterologist can be reached at any hour by calling [phone]. Do not use MyChart messaging for urgent concerns.   DIET:  We do recommend a small meal at first, but then you may proceed to your regular diet.  Drink plenty of fluids but you should avoid alcoholic beverages for 24 hours.  ACTIVITY:  You should plan to take it easy for the rest of today and you should NOT DRIVE or use heavy machinery until tomorrow (because of the sedation medicines used during the test).    FOLLOW UP: Our staff will call the number listed on your records the next business day following your procedure.  We will call around 7:15- 8:00 am to check on you and address any questions or concerns that you may have regarding the information given to you following your procedure. If we do not reach you, we will leave a message.     If any biopsies were taken you will be contacted by phone or by letter within the next 1-3 weeks.  Please call us at [phone] if you have not heard about the biopsies in 3 weeks.   SIGNATURES/CONFIDENTIALITY: You and/or your care partner have signed paperwork which will be entered into your electronic medical record.  These signatures attest to the fact that that the information above on your After Visit Summary has been reviewed and is understood.  Full responsibility of the confidentiality of this discharge information lies with you and/or your care-partner.

## 2022-02-12 NOTE — Progress Notes (Signed)
Called to room to assist during endoscopic procedure.  Patient ID and intended procedure confirmed with present staff. Received instructions for my participation in the procedure from the performing physician.  

## 2022-02-12 NOTE — Op Note (Addendum)
[facility] Patient Name: Denise Rice Procedure Date: 02/12/2022 9:03 AM MRN: [id number] Endoscopist: [place]. Loletha Carrow , MD, [id number] Age: 72 Referring MD:  Date of Birth: 27-Dec-1950 Gender: Female Account #: [id number] Procedure:                Colonoscopy Indications:              Generalized abdominal pain, Bloating and altered                            bowel habits Medicines:                Monitored Anesthesia Care Procedure:                Pre-Anesthesia Assessment:                           - Prior to the procedure, a History and Physical                            was performed, and patient medications and                            allergies were reviewed. The patient's tolerance of                            previous anesthesia was also reviewed. The risks                            and benefits of the procedure and the sedation                            options and risks were discussed with the patient.                            All questions were answered, and informed consent                            was obtained. Prior Anticoagulants: The patient has                            taken no anticoagulant or antiplatelet agents. ASA                            Grade Assessment: II - A patient with mild systemic                            disease. After reviewing the risks and benefits,                            the patient was deemed in satisfactory condition to                            undergo the procedure.  After obtaining informed consent, the colonoscope                            was passed under direct vision. Throughout the                            procedure, the patient's blood pressure, pulse, and                            oxygen saturations were monitored continuously. The                            CF [id number] #[id number] was introduced through the anus                            and advanced to the the terminal ileum, with                             identification of the appendiceal orifice and IC                            valve. The colonoscopy was performed without                            difficulty. The patient tolerated the procedure                            well. The quality of the bowel preparation was                            good. The terminal ileum, ileocecal valve,                            appendiceal orifice, and rectum were photographed. Scope In: 9:18:11 AM Scope Out: 9:33:54 AM Scope Withdrawal Time: 0 hours 12 minutes 17 seconds  Total Procedure Duration: 0 hours 15 minutes 43 seconds  Findings:                 The perianal and digital rectal examinations were                            normal.                           The terminal ileum appeared normal.                           Normal mucosa was found in the entire colon.                            Biopsies for histology were taken with a cold                            forceps from the right colon and left colon for  evaluation of microscopic colitis.                           Repeat examination of right colon under NBI                            performed.                           A diminutive polyp was found in the hepatic                            flexure. The polyp was semi-sessile. The polyp was                            removed with a cold snare. Resection and retrieval                            were complete.                           Multiple diverticula were found in the left colon                            and right colon.                           The exam was otherwise without abnormality on                            direct and retroflexion views. Complications:            No immediate complications. Estimated Blood Loss:     Estimated blood loss was minimal. Impression:               - The examined portion of the ileum was normal.                           - Normal mucosa in the  entire examined colon.                            Biopsied.                           - One diminutive polyp at the hepatic flexure,                            removed with a cold snare. Resected and retrieved.                           - Diverticulosis in the left colon and in the right                            colon.                           -  The examination was otherwise normal on direct                            and retroflexion views. Recommendation:           - Patient has a contact number available for                            emergencies. The signs and symptoms of potential                            delayed complications were discussed with the                            patient. Return to normal activities tomorrow.                            Written discharge instructions were provided to the                            patient.                           - Resume previous diet.                           - Continue present medications.                           - Await pathology results.                           - Repeat colonoscopy is recommended for                            surveillance. The colonoscopy date will be                            determined after pathology results from today's                            exam become available for review.                           - See the other procedure note for documentation of                            additional recommendations.                           -If all upper and lower endoscopic biopsies normal,                            empiric course of metronidazole for possible SIBO                            and post gastric  bypass patient. [name], MD 02/12/2022 9:49:08 AM This report has been signed electronically.

## 2022-02-13 ENCOUNTER — Telehealth: Payer: Self-pay

## 2022-02-13 NOTE — Telephone Encounter (Signed)
Attempted f/u call. No answer, left VM. 

## 2022-02-13 NOTE — Telephone Encounter (Signed)
pT is calling with questions about EGD she had on yesterday. Please advise

## 2022-02-14 ENCOUNTER — Other Ambulatory Visit: Payer: Self-pay

## 2022-02-14 MED ORDER — METRONIDAZOLE 250 MG PO TABS
250.0000 mg | ORAL_TABLET | Freq: Three times a day (TID) | ORAL | 0 refills | Status: DC
  Filled 2022-02-14: qty 42, 14d supply, fill #0

## 2022-02-14 NOTE — Telephone Encounter (Signed)
Patient called to follow up on message from yesterday. Please advise.

## 2022-02-14 NOTE — Telephone Encounter (Signed)
Patient reports lower back "muscle pain"  and believes due to positioning.  Informed the patient that she may take otc medications to help relieve or muscle cream.  Patient also asked about a prescription being sent to pharmacy.  According to MD notes this will be done based on pathology results.  Patient verbalized understanding and will await patholgy results.

## 2022-02-17 ENCOUNTER — Other Ambulatory Visit (HOSPITAL_COMMUNITY): Payer: Self-pay

## 2022-02-17 ENCOUNTER — Ambulatory Visit: Payer: No Typology Code available for payment source | Admitting: Family Medicine

## 2022-02-18 ENCOUNTER — Encounter: Payer: Self-pay | Admitting: Family Medicine

## 2022-02-18 ENCOUNTER — Other Ambulatory Visit: Payer: Self-pay

## 2022-02-18 ENCOUNTER — Ambulatory Visit: Payer: No Typology Code available for payment source | Admitting: Family Medicine

## 2022-02-18 ENCOUNTER — Other Ambulatory Visit (HOSPITAL_COMMUNITY): Payer: Self-pay

## 2022-02-18 VITALS — BP 122/84 | HR 65 | Temp 98.3°F | Ht 60.0 in | Wt 126.4 lb

## 2022-02-18 DIAGNOSIS — R252 Cramp and spasm: Secondary | ICD-10-CM | POA: Diagnosis not present

## 2022-02-18 NOTE — Progress Notes (Signed)
Subjective  CC:  Chief Complaint  Patient presents with   Hand Pain    Pt stated that she has been experiencing some hand tingling and some time her fingers will trigger inwards     HPI: Denise Rice is a 72 y.o. female who presents to the office today to address the problems listed above in the chief complaint. Strength training; lots of heavy barbell work and pull up work. Experiencing cramping in fingers.   Assessment  1. Hand or foot spasms      Plan  Mm spasms:  education given. Start stretching, good hydration, tonic water and magnesium.  Follow up: prn  Visit date not found  No orders of the defined types were placed in this encounter.  No orders of the defined types were placed in this encounter.     I reviewed the patients updated PMH, FH, and SocHx.    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     Allergies: Patient is allergic to lactose, topiramate, and alprazolam. Family History: Patient family history includes Anxiety disorder in her mother; Cancer in her paternal aunt and paternal uncle; Cancer - Prostate in her father; Diabetes in her father, maternal aunt, mother, and sister; Heart attack in her mother; Heart disease in her father, maternal aunt, and mother; High blood pressure in her sister; Hyperlipidemia in her father and mother; Hypertension in her father and mother; Kidney disease in her father and mother; Obesity in her father and mother; Stroke in her father, mother, and sister; Thyroid disease in her mother. Social History:  Patient  reports that she has never smoked. She has never used smokeless tobacco. She reports that she does not drink alcohol and does not use drugs.  Review of Systems: Constitutional: Negative for fever malaise or anorexia Cardiovascular: negative for chest pain Respiratory: negative for SOB or persistent cough Gastrointestinal: negative for abdominal pain  Objective  Vitals: BP 122/84   Pulse 65   Temp 98.3 F (36.8 C)   Ht 5' (1.524 m)   Wt 126 lb 6.4 oz (57.3 kg)   LMP 01/13/2005   SpO2 99%   BMI 24.69 kg/m  General: no acute distress , A&Ox3 Hands appear normal. No ttp over mcp and no tendon nodules. No locking on exam.  Commons side effects, risks, benefits, and alternatives for medications and treatment plan prescribed today were discussed, and the patient expressed understanding of the given instructions. Patient is instructed to call or message via MyChart if he/she has any questions or concerns regarding our treatment plan. No barriers to understanding were identified. We discussed Red Flag symptoms and signs in detail. Patient expressed understanding regarding what to do in case of urgent or emergency type symptoms.  Medication list was reconciled, printed and provided to the patient in AVS. Patient instructions and summary  information was reviewed with the patient as documented in the AVS. This note was prepared with assistance of Dragon voice recognition software. Occasional wrong-word or sound-a-like substitutions may have occurred due to the inherent limitations of voice recognition software

## 2022-02-19 ENCOUNTER — Other Ambulatory Visit (HOSPITAL_COMMUNITY): Payer: Self-pay

## 2022-02-21 ENCOUNTER — Other Ambulatory Visit: Payer: Self-pay | Admitting: Family Medicine

## 2022-02-23 DIAGNOSIS — M62830 Muscle spasm of back: Secondary | ICD-10-CM | POA: Diagnosis not present

## 2022-02-23 DIAGNOSIS — M5442 Lumbago with sciatica, left side: Secondary | ICD-10-CM | POA: Diagnosis not present

## 2022-02-24 ENCOUNTER — Other Ambulatory Visit (HOSPITAL_COMMUNITY): Payer: Self-pay

## 2022-02-24 ENCOUNTER — Other Ambulatory Visit: Payer: Self-pay

## 2022-02-24 MED ORDER — TRAZODONE HCL 100 MG PO TABS
100.0000 mg | ORAL_TABLET | Freq: Every evening | ORAL | 3 refills | Status: DC | PRN
  Filled 2022-02-24: qty 45, 30d supply, fill #0
  Filled 2022-03-18 – 2022-03-30 (×2): qty 45, 30d supply, fill #1
  Filled 2022-05-05: qty 45, 30d supply, fill #2
  Filled 2022-06-01: qty 45, 30d supply, fill #3

## 2022-02-27 ENCOUNTER — Encounter: Payer: Self-pay | Admitting: Family Medicine

## 2022-03-10 ENCOUNTER — Encounter: Payer: Self-pay | Admitting: Family Medicine

## 2022-03-10 ENCOUNTER — Ambulatory Visit (INDEPENDENT_AMBULATORY_CARE_PROVIDER_SITE_OTHER): Payer: No Typology Code available for payment source | Admitting: Family Medicine

## 2022-03-10 VITALS — BP 110/80 | HR 76 | Temp 98.3°F | Ht 60.0 in | Wt 126.2 lb

## 2022-03-10 DIAGNOSIS — M461 Sacroiliitis, not elsewhere classified: Secondary | ICD-10-CM | POA: Diagnosis not present

## 2022-03-10 DIAGNOSIS — M5442 Lumbago with sciatica, left side: Secondary | ICD-10-CM | POA: Diagnosis not present

## 2022-03-10 NOTE — Progress Notes (Signed)
Subjective  CC:  Chief Complaint  Patient presents with   Back Pain   Hip Pain    HPI: Denise Rice is a 72 y.o. female who presents to the office today to address the problems listed above in the chief complaint. Denise Rice reports that approximately 2 to 3 weeks ago she was experiencing middle low back pain, however it became acute, radiated to the left and down the leg and was severe.  [facility].  Treated with prednisone and baclofen.  Ultimately was able to improve.  Now with only mild soreness.  Working with her physical trainer on stretching.  No prior history of back pain.  No weakness.  Assessment  1. Acute left-sided low back pain with left-sided sciatica   2. Sacroiliitis (HCC)      Plan  Low back pain: Likely multifactorial.  Possible overuse, osteoarthritis, some radicular symptoms and tender over the sacral iliac joint on the left.  Recommend Tylenol for persistent soreness and to start working with physical therapist at her gym.   Discussed sacroiliitis.  Patient cannot tolerate NSAIDs due to history of gastric bypass.  Recommend physical therapy.  Stretching.  Follow-up with me if needed  Follow up: As needed   No orders of the defined types were placed in this encounter.  No orders of the defined types were placed in this encounter.     I reviewed the patients updated PMH, FH, and SocHx.    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   busPIRone (BUSPAR) 7.5 MG tablet Take 1 tablet (7.5 mg total) by mouth 2 (two) times daily as needed (anxiety).   Cholecalciferol (VITAMIN D) 50 MCG (2000 UT) CAPS Take 2,000 Units by mouth daily.   Cod Liver Oil CAPS Take 1 capsule by mouth daily.   cyanocobalamin (VITAMIN B12) 1000 MCG/ML injection Inject 1 mL (1,000 mcg total) into the muscle every 30 (thirty) days.   diazepam (VALIUM) 10 MG tablet Take 1 tablet (10 mg total) by mouth daily as needed for anxiety   ferrous sulfate 325 (65 FE) MG tablet Take 325 mg by mouth daily with breakfast.   Potassium (POTASSIMIN PO) Take 1 tablet by mouth daily.   Syringe/Needle, Disp, (SYRINGE 3CC/25GX1") 25G X  1" 3 ML MISC Use once monthly for injection of Vitamin B-12   traZODone (DESYREL) 100 MG tablet Take 1-1.5 tablets (100-150 mg total) by mouth at bedtime as needed for sleep.    Allergies: Patient is allergic to lactose, topiramate, and alprazolam. Family History: Patient family history includes Anxiety disorder in her mother; Cancer in her paternal aunt and paternal uncle; Cancer - Prostate in her father; Diabetes in her father, maternal aunt, mother, and sister; Heart attack in her mother; Heart disease in her father, maternal aunt, and mother; High blood pressure in her sister; Hyperlipidemia in her father and mother; Hypertension in her father and mother; Kidney disease in her father and  mother; Obesity in her father and mother; Stroke in her father, mother, and sister; Thyroid disease in her mother. Social History:  Patient  reports that she has never smoked. She has never used smokeless tobacco. She reports that she does not drink alcohol and does not use drugs.  Review of Systems: Constitutional: Negative for fever malaise or anorexia Cardiovascular: negative for chest pain Respiratory: negative for SOB or persistent cough Gastrointestinal: negative for abdominal pain  Objective  Vitals: BP 110/80   Pulse 76   Temp 98.3 F (36.8 C)   Ht 5' (1.524 m)   Wt 126 lb 3.2 oz (57.2 kg)   LMP 01/13/2005   SpO2 98%   BMI 24.65 kg/m  General: no acute distress , A&Ox3 Normal gait Left lower back with paravertebral muscle tenderness, no vertebral spine tenderness.  Positive tenderness over SI joint on left.  No sciatic notch tenderness.  No hip tenderness.  Full range of motion of back however pain with extension and lateral extension.  Commons side effects, risks, benefits, and alternatives for medications and treatment plan prescribed today were discussed, and the patient expressed understanding of the given instructions. Patient is instructed to call or message via MyChart if he/she has any questions or concerns regarding our treatment plan. No barriers to understanding were identified. We discussed Red Flag symptoms and signs in detail. Patient expressed understanding regarding what to do in case of urgent or emergency type symptoms.  Medication list was reconciled, printed and provided to the patient in AVS. Patient instructions and summary information was reviewed with the patient as documented in the AVS. This note was prepared with assistance of Dragon voice recognition software. Occasional wrong-word or sound-a-like substitutions may have occurred due to the inherent limitations of voice recognition software

## 2022-03-18 ENCOUNTER — Other Ambulatory Visit (HOSPITAL_COMMUNITY): Payer: Self-pay

## 2022-03-18 ENCOUNTER — Other Ambulatory Visit: Payer: Self-pay

## 2022-03-21 ENCOUNTER — Ambulatory Visit (INDEPENDENT_AMBULATORY_CARE_PROVIDER_SITE_OTHER): Payer: No Typology Code available for payment source | Admitting: Physician Assistant

## 2022-03-21 ENCOUNTER — Encounter: Payer: Self-pay | Admitting: Physician Assistant

## 2022-03-21 VITALS — BP 118/74 | HR 61 | Ht 60.0 in | Wt 120.0 lb

## 2022-03-21 DIAGNOSIS — R194 Change in bowel habit: Secondary | ICD-10-CM

## 2022-03-21 DIAGNOSIS — R1084 Generalized abdominal pain: Secondary | ICD-10-CM | POA: Diagnosis not present

## 2022-03-21 DIAGNOSIS — K638219 Small intestinal bacterial overgrowth, unspecified: Secondary | ICD-10-CM | POA: Diagnosis not present

## 2022-03-21 NOTE — Progress Notes (Signed)
Chief Complaint: Follow-up abdominal pain  HPI:    Denise Rice is a 72 year old female with a past medical history as listed below including constipation, history of gastric bypass, history of small bowel obstruction, PCOS and multiple others, known to Dr. Loletha Carrow, who returns to clinic today for follow-up of her abdominal pain.    10/12/2015 colonoscopy for screening with decreased sphincter tone, diverticulosis in the right colon, internal hemorrhoids and otherwise normal.  Repeat recommended in 10 years.    10/14/2021 patient seen in the ER for abdominal pain.  At that time presented with a 5-day history of lower abdominal pain, bloody diarrhea with strands of mucus several times a day, nausea and vomiting.  GI pathogen panel showed Campylobacter.  CBC and CMP within normal limits.  CTAP with contrast showed fluid throughout the large bowel which can be seen with diarrhea/enteritis, hyperattenuating material in the rectum which may be blood, no source of bleeding identified.  Postcholecystectomy changes which may be related to mild biliary dilation.  Question some debris in the bile duct.  Patient received Dilaudid, Bentyl, Morphine, Zofran and Azithromycin and resolved her abdominal pain and nausea.  Patient sent home with Azithromycin for 4 days and Bentyl twice daily as well as Zofran.    10/28/2021 patient seen by PCP for gastroenteritis.  Was having similar symptoms and has been seen in the ER and had a refill of Bentyl and Zofran.  A C. difficile was run as well as fecal lactoferrin, ova and parasite and Salmonella/Shigella/Campylobacter/Shiga toxin.    10/29/2021 stool studies were normal/negative.    11-10-22 patient was feeling wonderful per phone notes.    11/14/2021 patient described abdominal pain and nausea as well as radiation from constipation to diarrhea at that time scheduled her for an EGD and colonoscopy.  Continued on Bentyl and Zofran.       02/12/2022 EGD and colonoscopy.  EGD with  gastric bypass and a normal-sized pouch, gastrojejunal anastomosis characterized by healthy-appearing mucosa and otherwise normal.  Colonoscopy with 1 polyp at the [facility] flexure and diverticulosis in the left and right colon otherwise normal.  Pathology showed normal colon tissue from the polyp, no precancerous changes and repeat recommended 10 years.  Patient was empirically treated for possible SIBO with Metronidazole 251 tab 3 times daily x 14 days.    Today, the patient presents to clinic and tells me that she feels wonderful no GI complaints or concerns.  Does tell me that unfortunately she had a lot of lower back pain after time of her procedures which was treated by the urgent care with baclofen and a steroid course, it got better afterwards.  She does feel like the Flagyl was helpful.    Denies fever, chills, weight loss, blood in her stool, nausea, vomiting or symptoms that awaken her from sleep.  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    craniotomy for repair of R ear   Vertigo    Vitamin B12 deficiency 07/29/2018   Started IM injections monthly 06/2018   Vitamin D deficiency     Past Surgical History:  Procedure Laterality Date   BLEPHAROPLASTY  03-05-2000   CRANIOTOMY  08/2014   to repair dehescence in right ear   HAMMER TOE SURGERY Left 08-29-2013   2nd toe   IMPLANTATION BONE ANCHORED HEARING AID Left 07/27/2008   left temporal bone;now removed    LAPAROSCOPIC CHOLECYSTECTOMY  03-31-2005   LAPAROSCOPIC GASTRIC BANDING  02-08-2007   LAPAROSCOPY N/A 11/17/2017   Procedure: LAPAROSCOPY LYSIS OF ADHESIONS FOR SMALL BOWEL OBSTRUCTION;  Surgeon: Excell Seltzer, MD;  Location: [place];  Service: General;  Laterality: N/A;   LASER ABLATION CONDOLAMATA N/A 07/30/2012   Procedure: LASER ABLATION CONDOLAMATA;  Surgeon: Leighton Ruff, MD;  Location: [place];  Service: General;  Laterality: N/A;   LASIK Bilateral 2002   LYSIS OF ADHESION N/A 11/17/2017   Procedure: LYSIS OF ADHESION;  Surgeon: Excell Seltzer, MD;  Location: [place];  Service: General;  Laterality: N/A;   ROUX-EN-Y GASTRIC BYPASS  AUG 2013   [place];  1994 x2;  02-03-1998   STRABISMUS SURGERY Right    VAGINAL HYSTERECTOMY  2007   fibroids    Current Outpatient Medications  Medication Sig Dispense Refill   acetaminophen (TYLENOL) 325 MG tablet Take 325 mg by mouth daily as needed for headache.     busPIRone (BUSPAR) 7.5 MG tablet Take 1 tablet (7.5 mg total) by mouth 2 (two) times daily as needed (anxiety). 60 tablet 5   Cholecalciferol (VITAMIN D) 50 MCG (2000 UT) CAPS Take 2,000 Units by mouth daily.     Cod Liver Oil CAPS Take 1 capsule by mouth daily.     cyanocobalamin (VITAMIN B12) 1000 MCG/ML injection Inject 1 mL (1,000 mcg total) into the muscle every 30 (thirty) days. 10 mL 2   diazepam (VALIUM) 10 MG tablet Take 1 tablet (10 mg total) by mouth daily as needed for anxiety 30 tablet 5   ferrous sulfate 325 (65 FE) MG tablet Take 325 mg by mouth daily with breakfast.     Potassium (POTASSIMIN PO) Take 1 tablet by mouth daily.     Syringe/Needle, Disp, (SYRINGE 3CC/25GX1") 25G X 1" 3 ML MISC Use once monthly for injection of Vitamin B-12 50 each 0   traZODone (DESYREL) 100 MG tablet Take 1-1.5 tablets (100-150 mg total) by mouth at bedtime as needed for sleep. 45 tablet 3   No current facility-administered medications for this visit.     Allergies as of 03/21/2022 - Review Complete 03/21/2022  Allergen Reaction Noted   Lactose Diarrhea 07/10/2020   Topiramate Nausea Only 01/22/2016   Alprazolam Nausea Only 08/15/2021    Family History  Problem Relation Age of Onset   Heart attack Mother    Heart disease Mother    Diabetes Mother    Hypertension Mother    Hyperlipidemia Mother    Stroke Mother    Kidney disease Mother    Thyroid disease Mother    Anxiety disorder Mother    Obesity Mother    Cancer - Prostate Father    Diabetes Father    Hypertension Father    Hyperlipidemia Father    Heart disease Father    Stroke Father    Kidney disease Father    Obesity Father    Stroke Sister  Diabetes Sister    High blood pressure Sister    Diabetes Maternal Aunt    Heart disease Maternal Aunt    Cancer Paternal Aunt    Cancer Paternal Uncle    Colon cancer Neg Hx    Colon polyps Neg Hx     Social History   Socioeconomic History   Marital status: Married    Spouse name: Vicenta Dunning    Number of children: 1   Years of education: MBA   Highest education level: Not on file  Occupational History   Occupation: Primary school teacher - retired    Fish farm manager: [place]    Comment: [place]   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   Left handed.   Caffeine use: 2-3 cups (coffee) per day    Social Determinants of Health   Financial Resource Strain: Low Risk  (10/29/2021)   Overall Financial Resource Strain (CARDIA)    Difficulty of Paying Living Expenses: Not hard at all  Food Insecurity: No Food  Insecurity (10/29/2021)   Hunger Vital Sign    Worried About Running Out of Food in the Last Year: Never true    Ran Out of Food in the Last Year: Never true  Transportation Needs: No Transportation Needs (10/29/2021)   PRAPARE - Hydrologist (Medical): No    Lack of Transportation (Non-Medical): No  Physical Activity: Sufficiently Active (10/29/2021)   Exercise Vital Sign    Days of Exercise per Week: 3 days    Minutes of Exercise per Session: 60 min  Stress: Stress Concern Present (10/29/2021)   [place]    Feeling of Stress : To some extent  Social Connections: Moderately Isolated (10/29/2021)   Social Connection and Isolation Panel [NHANES]    Frequency of Communication with Friends and Family: More than three times a week    Frequency of Social Gatherings with Friends and Family: More than three times a week    Attends Religious Services: Never    Marine scientist or Organizations: No    Attends Archivist Meetings: Never    Marital Status: Married  Human resources officer Violence: Not At Risk (10/29/2021)   Humiliation, Afraid, Rape, and Kick questionnaire    Fear of Current or Ex-Partner: No    Emotionally Abused: No    Physically Abused: No    Sexually Abused: No    Review of Systems:    Constitutional: No weight loss, fever or chills Cardiovascular: No chest pain Respiratory: No SOB  Gastrointestinal: See HPI and otherwise negative   Physical Exam:  Vital signs: BP 118/74   Pulse 61   Ht 5' (1.524 m)   Wt 120 lb (54.4 kg)   LMP 01/13/2005   BMI 23.44 kg/m    Constitutional:   Pleasant Caucasian female appears to be in NAD, Well developed, Well nourished, alert and cooperative Respiratory: Respirations even and unlabored. Lungs clear to auscultation bilaterally.   No wheezes, crackles,  or rhonchi.  Cardiovascular: Normal S1, S2. No MRG. Regular rate and  rhythm. No peripheral edema, cyanosis or pallor.  Gastrointestinal:  Soft, nondistended, nontender. No rebound or guarding. Normal bowel sounds. No appreciable masses or hepatomegaly. Rectal:  Not performed.  Psychiatric:  Demonstrates good judgement and reason without abnormal affect or behaviors.  RELEVANT LABS AND IMAGING: CBC    Component Value Date/Time   WBC 4.4 01/24/2022 1224   RBC 4.50 01/24/2022 1224   HGB 12.7 01/24/2022 1224   HGB 12.1 04/22/2021 1234   HGB 12.5 02/26/2018 1032   HCT 37.0 01/24/2022 1224   HCT 36.8 02/26/2018 1032   PLT 189.0 01/24/2022 1224   PLT 172 04/22/2021 1234   PLT 256 02/26/2018 1032   MCV 82.2 01/24/2022 1224   MCV 81 02/26/2018 1032   MCH 28.0 12/19/2021 1613   MCHC 34.3 01/24/2022 1224   RDW 14.1 01/24/2022 1224   RDW 14.3 02/26/2018 1032   LYMPHSABS 1.4 01/24/2022 1224   LYMPHSABS 1.6 02/26/2018 1032   MONOABS 0.3 01/24/2022 1224   EOSABS 0.0 01/24/2022 1224   EOSABS 0.1 02/26/2018 1032   BASOSABS 0.0 01/24/2022 1224   BASOSABS 0.0 02/26/2018 1032    CMP     Component Value Date/Time   NA 141 01/24/2022 1224   NA 140 08/03/2018 0853   K 3.9 01/24/2022 1224   CL 103 01/24/2022 1224   CO2 27 01/24/2022 1224   GLUCOSE 92 01/24/2022 1224   BUN 17 01/24/2022 1224   BUN 15 08/03/2018 0853   CREATININE 0.75 01/24/2022 1224   CREATININE 0.78 04/22/2021 1234   CREATININE 0.64 09/07/2015 1014   CALCIUM 8.4 01/24/2022 1224   PROT 6.6 01/24/2022 1224   PROT 6.9 08/03/2018 0853   ALBUMIN 4.2 01/24/2022 1224   ALBUMIN 4.0 08/03/2018 0853   AST 29 01/24/2022 1224   AST 24 04/22/2021 1234   ALT 20 01/24/2022 1224   ALT 20 04/22/2021 1234   ALKPHOS 71 01/24/2022 1224   BILITOT 0.4 01/24/2022 1224   BILITOT 0.5 04/22/2021 1234   GFRNONAA >60 12/19/2021 1613   GFRNONAA >60 04/22/2021 1234   GFRAA >60 09/15/2019 1616    Assessment: 1.  Abdominal pain: Recently normal EGD and colonoscopy, treated for possible SIBO with Flagyl and  symptoms are gone  Plan: 1.  Patient doing well today.  Discussed that we can empirically treat her again with Metronidazole 250 3 times daily x 14 days if she has return of SIBO symptoms anytime in the next year. 2.  Patient to follow in clinic with [place] as needed.  Ellouise Newer, PA-C [facility] 03/21/2022, 1:58 PM  Cc: [name], MD

## 2022-03-21 NOTE — Patient Instructions (Signed)
Glad you are doing so well, please call if you have return of any of your symptoms.  Sincerely, Ellouise Newer, PA-C

## 2022-03-24 NOTE — Progress Notes (Signed)
____________________________________________________________  Attending physician addendum:  Thank you for sending this case to me. I have reviewed the entire note and agree with the plan.   Zaryah Seckel Danis, MD  ____________________________________________________________  

## 2022-03-31 ENCOUNTER — Other Ambulatory Visit: Payer: Self-pay

## 2022-04-02 ENCOUNTER — Other Ambulatory Visit: Payer: Self-pay | Admitting: Family Medicine

## 2022-04-02 DIAGNOSIS — Z1231 Encounter for screening mammogram for malignant neoplasm of breast: Secondary | ICD-10-CM

## 2022-04-14 ENCOUNTER — Other Ambulatory Visit: Payer: Self-pay | Admitting: Family Medicine

## 2022-04-15 ENCOUNTER — Other Ambulatory Visit: Payer: Self-pay

## 2022-04-15 MED ORDER — BUSPIRONE HCL 7.5 MG PO TABS
7.5000 mg | ORAL_TABLET | Freq: Two times a day (BID) | ORAL | 5 refills | Status: DC | PRN
  Filled 2022-04-15: qty 60, 30d supply, fill #0
  Filled 2022-05-09: qty 60, 30d supply, fill #1
  Filled 2022-06-09: qty 60, 30d supply, fill #2
  Filled 2022-07-07: qty 60, 30d supply, fill #3
  Filled 2022-08-05: qty 60, 30d supply, fill #4
  Filled 2022-08-31: qty 60, 30d supply, fill #5

## 2022-04-23 ENCOUNTER — Encounter: Payer: Self-pay | Admitting: Family

## 2022-04-23 ENCOUNTER — Other Ambulatory Visit: Payer: Self-pay

## 2022-04-23 ENCOUNTER — Other Ambulatory Visit (HOSPITAL_COMMUNITY): Payer: Self-pay

## 2022-04-23 DIAGNOSIS — H00024 Hordeolum internum left upper eyelid: Secondary | ICD-10-CM | POA: Diagnosis not present

## 2022-04-23 MED ORDER — DOXYCYCLINE HYCLATE 100 MG PO TABS
100.0000 mg | ORAL_TABLET | Freq: Two times a day (BID) | ORAL | 0 refills | Status: DC
  Filled 2022-04-23: qty 60, 30d supply, fill #0

## 2022-04-28 ENCOUNTER — Other Ambulatory Visit: Payer: Self-pay

## 2022-04-29 DIAGNOSIS — H00024 Hordeolum internum left upper eyelid: Secondary | ICD-10-CM | POA: Diagnosis not present

## 2022-05-06 ENCOUNTER — Other Ambulatory Visit (HOSPITAL_COMMUNITY): Payer: Self-pay

## 2022-05-09 ENCOUNTER — Other Ambulatory Visit: Payer: Self-pay | Admitting: Family Medicine

## 2022-05-10 ENCOUNTER — Other Ambulatory Visit (HOSPITAL_COMMUNITY): Payer: Self-pay

## 2022-05-10 MED ORDER — DIAZEPAM 10 MG PO TABS
10.0000 mg | ORAL_TABLET | Freq: Every day | ORAL | 5 refills | Status: DC
  Filled 2022-05-10 – 2022-05-12 (×2): qty 30, 30d supply, fill #0
  Filled 2022-06-09: qty 30, 30d supply, fill #1
  Filled 2022-07-05: qty 30, 30d supply, fill #2
  Filled 2022-08-05: qty 30, 30d supply, fill #3
  Filled 2022-08-31 – 2022-09-02 (×2): qty 30, 30d supply, fill #4
  Filled 2022-09-27 – 2022-09-30 (×2): qty 30, 30d supply, fill #5

## 2022-05-12 ENCOUNTER — Other Ambulatory Visit: Payer: Self-pay

## 2022-05-16 ENCOUNTER — Ambulatory Visit
Admission: RE | Admit: 2022-05-16 | Discharge: 2022-05-16 | Disposition: A | Discharge status: Home / Self Care | Payer: No Typology Code available for payment source | Source: Ambulatory Visit | Attending: Family Medicine | Admitting: Family Medicine

## 2022-05-16 DIAGNOSIS — Z1231 Encounter for screening mammogram for malignant neoplasm of breast: Secondary | ICD-10-CM | POA: Diagnosis not present

## 2022-05-22 ENCOUNTER — Other Ambulatory Visit (HOSPITAL_COMMUNITY): Payer: Self-pay

## 2022-05-22 DIAGNOSIS — H00024 Hordeolum internum left upper eyelid: Secondary | ICD-10-CM | POA: Diagnosis not present

## 2022-05-22 MED ORDER — OFLOXACIN 0.3 % OP SOLN
1.0000 [drp] | Freq: Four times a day (QID) | OPHTHALMIC | 0 refills | Status: AC
  Filled 2022-05-22: qty 5, 25d supply, fill #0

## 2022-05-26 ENCOUNTER — Encounter (HOSPITAL_BASED_OUTPATIENT_CLINIC_OR_DEPARTMENT_OTHER): Payer: Self-pay | Admitting: Emergency Medicine

## 2022-05-26 ENCOUNTER — Other Ambulatory Visit: Payer: Self-pay

## 2022-05-26 ENCOUNTER — Emergency Department (HOSPITAL_BASED_OUTPATIENT_CLINIC_OR_DEPARTMENT_OTHER)
Admission: EM | Admit: 2022-05-26 | Discharge: 2022-05-26 | Disposition: A | Discharge status: Home / Self Care | Payer: No Typology Code available for payment source | Attending: Emergency Medicine | Admitting: Emergency Medicine

## 2022-05-26 ENCOUNTER — Emergency Department (HOSPITAL_BASED_OUTPATIENT_CLINIC_OR_DEPARTMENT_OTHER): Payer: No Typology Code available for payment source

## 2022-05-26 ENCOUNTER — Ambulatory Visit: Payer: Self-pay | Admitting: *Deleted

## 2022-05-26 DIAGNOSIS — R509 Fever, unspecified: Secondary | ICD-10-CM | POA: Diagnosis not present

## 2022-05-26 DIAGNOSIS — K5900 Constipation, unspecified: Secondary | ICD-10-CM | POA: Diagnosis not present

## 2022-05-26 DIAGNOSIS — R1084 Generalized abdominal pain: Secondary | ICD-10-CM

## 2022-05-26 DIAGNOSIS — R1033 Periumbilical pain: Secondary | ICD-10-CM | POA: Diagnosis not present

## 2022-05-26 DIAGNOSIS — K7689 Other specified diseases of liver: Secondary | ICD-10-CM | POA: Diagnosis not present

## 2022-05-26 DIAGNOSIS — R109 Unspecified abdominal pain: Secondary | ICD-10-CM | POA: Diagnosis present

## 2022-05-26 LAB — CBC
HCT: 33.7 % — ABNORMAL LOW (ref 36.0–46.0)
Hemoglobin: 11.7 g/dL — ABNORMAL LOW (ref 12.0–15.0)
MCH: 27.8 pg (ref 26.0–34.0)
MCHC: 34.7 g/dL (ref 30.0–36.0)
MCV: 80 fL (ref 80.0–100.0)
Platelets: 171 10*3/uL (ref 150–400)
RBC: 4.21 MIL/uL (ref 3.87–5.11)
RDW: 12.9 % (ref 11.5–15.5)
WBC: 3.9 10*3/uL — ABNORMAL LOW (ref 4.0–10.5)
nRBC: 0 % (ref 0.0–0.2)

## 2022-05-26 LAB — COMPREHENSIVE METABOLIC PANEL
ALT: 15 U/L (ref 0–44)
AST: 20 U/L (ref 15–41)
Albumin: 3.6 g/dL (ref 3.5–5.0)
Alkaline Phosphatase: 63 U/L (ref 38–126)
Anion gap: 5 (ref 5–15)
BUN: 17 mg/dL (ref 8–23)
CO2: 28 mmol/L (ref 22–32)
Calcium: 7.8 mg/dL — ABNORMAL LOW (ref 8.9–10.3)
Chloride: 105 mmol/L (ref 98–111)
Creatinine, Ser: 0.71 mg/dL (ref 0.44–1.00)
GFR, Estimated: >60 mL/min (ref >60)
Glucose, Bld: 104 mg/dL — ABNORMAL HIGH (ref 70–99)
Potassium: 4.3 mmol/L (ref 3.5–5.1)
Sodium: 138 mmol/L (ref 135–145)
Total Bilirubin: 0.2 mg/dL — ABNORMAL LOW (ref 0.3–1.2)
Total Protein: 6.3 g/dL — ABNORMAL LOW (ref 6.5–8.1)

## 2022-05-26 LAB — URINALYSIS, ROUTINE W REFLEX MICROSCOPIC
Bilirubin Urine: NEGATIVE
Glucose, UA: NEGATIVE mg/dL
Ketones, ur: NEGATIVE mg/dL
Leukocytes,Ua: NEGATIVE
Nitrite: NEGATIVE
Protein, ur: NEGATIVE mg/dL
Specific Gravity, Urine: 1.025 (ref 1.005–1.030)
pH: 5.5 (ref 5.0–8.0)

## 2022-05-26 LAB — OCCULT BLOOD X 1 CARD TO LAB, STOOL: Fecal Occult Bld: NEGATIVE

## 2022-05-26 LAB — URINALYSIS, MICROSCOPIC (REFLEX): WBC, UA: NONE SEEN WBC/hpf (ref 0–5)

## 2022-05-26 LAB — LIPASE, BLOOD: Lipase: 41 U/L (ref 11–51)

## 2022-05-26 MED ORDER — ONDANSETRON HCL 4 MG/2ML IJ SOLN
4.0000 mg | Freq: Once | INTRAMUSCULAR | Status: AC
  Administered 2022-05-26: 4 mg via INTRAVENOUS
  Filled 2022-05-26: qty 2

## 2022-05-26 MED ORDER — SENNOSIDES-DOCUSATE SODIUM 8.6-50 MG PO TABS
1.0000 | ORAL_TABLET | Freq: Every evening | ORAL | 0 refills | Status: AC | PRN
  Filled 2022-05-26: qty 30, 30d supply, fill #0

## 2022-05-26 MED ORDER — DICYCLOMINE HCL 10 MG/ML IM SOLN
20.0000 mg | Freq: Once | INTRAMUSCULAR | Status: AC
  Administered 2022-05-26: 20 mg via INTRAMUSCULAR
  Filled 2022-05-26: qty 2

## 2022-05-26 MED ORDER — DICYCLOMINE HCL 20 MG PO TABS
20.0000 mg | ORAL_TABLET | Freq: Two times a day (BID) | ORAL | 0 refills | Status: DC | PRN
  Filled 2022-05-26: qty 20, 10d supply, fill #0

## 2022-05-26 MED ORDER — IOHEXOL 300 MG/ML  SOLN
80.0000 mL | Freq: Once | INTRAMUSCULAR | Status: AC | PRN
  Administered 2022-05-26: 80 mL via INTRAVENOUS

## 2022-05-26 MED ORDER — POLYETHYLENE GLYCOL 3350 17 GM/SCOOP PO POWD
17.0000 g | Freq: Every day | ORAL | 0 refills | Status: DC
  Filled 2022-05-26: qty 14, 14d supply, fill #0
  Filled 2022-05-30: qty 238, 14d supply, fill #0

## 2022-05-26 NOTE — Telephone Encounter (Signed)
  Chief Complaint: Abdominal pain Symptoms: Abdominal pain across stomach at navel area. Constant 8/10.Blood in stool, bright red, no clots. Fever 99.3 Frequency: 2 days, worsening Pertinent Negatives: Patient denies  Disposition: [x] ED /[] Urgent Care (no appt availability in office) / [] Appointment(In office/virtual)/ []  [place] [facility]/ [] Home Care/ [] Refused Recommended Disposition /[] [place] [facility]/ []  Follow-up with PCP Additional Notes: States h/o blockages "Feels just like this." Pt directed to ED. Care advise provided, verbalizes understanding.  Reason for Disposition  [1] SEVERE pain (e.g., excruciating) AND [2] present > 1 hour  Answer Assessment - Initial Assessment Questions 1. LOCATION: "Where does it hurt?"      Across navel 2. RADIATION: "Does the pain shoot anywhere else?" (e.g., chest, back)     "Slice me in half." 3. ONSET: "When did the pain begin?" (e.g., minutes, hours or days ago)      2 days 4. SUDDEN: "Gradual or sudden onset?"     *No Answer* 5. PATTERN "Does the pain come and go, or is it constant?"    - If it comes and goes: "How long does it last?" "Do you have pain now?"     (Note: Comes and goes means the pain is intermittent. It goes away completely between bouts.)    - If constant: "Is it getting better, staying the same, or getting worse?"      (Note: Constant means the pain never goes away completely; most serious pain is constant and gets worse.)      Ocnstant 6. SEVERITY: "How bad is the pain?"  (e.g., Scale 1-10; mild, moderate, or severe)    - MILD (1-3): Doesn't interfere with normal activities, abdomen soft and not tender to touch.     - MODERATE (4-7): Interferes with normal activities or awakens from sleep, abdomen tender to touch.     - SEVERE (8-10): Excruciating pain, doubled over, unable to do any normal activities.       8/10 7. RECURRENT SYMPTOM: "Have you ever had this type of stomach pain before?" If Yes, ask: "When was  the last time?" and "What happened that time?"      Yes, blockages 8. CAUSE: "What do you think is causing the stomach pain?"     Unsure 9. RELIEVING/AGGRAVATING FACTORS: "What makes it better or worse?" (e.g., antacids, bending or twisting motion, bowel movement)      10. OTHER SYMPTOMS: "Do you have any other symptoms?" (e.g., back pain, diarrhea, fever, urination pain, vomiting)       Blood in stool, bright red.  Protocols used: Abdominal Pain - Female-A-AH

## 2022-05-26 NOTE — ED Provider Notes (Signed)
[place] [facility] Provider Note   CSN: [id number] Arrival date & time: 05/26/22  1759     History {Add pertinent medical, surgical, social history, OB history to HPI:1} Chief Complaint  Patient presents with   Fever    Denise Rice is a 72 y.o. female.  HPI     Abdominal pain across the midline and across, similar to when she has had small bowel obstruction in the past.  Had fresh blood in stool.  Pain like someone slicing her in half across the top of the abdomen. No nausea this time. No vomiting.  99.8 at home temperature.  No urinary symptoms.  Mostly constipation, little rock like stools, rectal pain with bowel movements.  Bright red blood on toilet paper and on outside of stool on Saturday.  Has had small drops since then.  Yesterday was a little.  No noticeable clots.  No vaginal bleeding or discharge. No cp or dyspnea. Pain started in the middle of last week but has slowly increased in intensity. Sharp pain.  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    craniotomy for repair of R ear   Vertigo    Vitamin B12 deficiency 07/29/2018   Started IM injections monthly 06/2018   Vitamin D deficiency    Past Surgical History:  Procedure Laterality Date   BLEPHAROPLASTY  03-05-2000   CRANIOTOMY  08/2014   to repair dehescence in right ear   HAMMER TOE SURGERY Left 08-29-2013   2nd toe   IMPLANTATION BONE  ANCHORED HEARING AID Left 07/27/2008   left temporal bone;now removed   LAPAROSCOPIC CHOLECYSTECTOMY  03-31-2005   LAPAROSCOPIC GASTRIC BANDING  02-08-2007   LAPAROSCOPY N/A 11/17/2017   Procedure: LAPAROSCOPY LYSIS OF ADHESIONS FOR SMALL BOWEL OBSTRUCTION;  Surgeon: Glenna Fellows, MD;  Location: [facility];  Service: General;  Laterality: N/A;   LASER ABLATION CONDOLAMATA N/A 07/30/2012   Procedure: LASER ABLATION CONDOLAMATA;  Surgeon: Romie Levee, MD;  Location: [facility];  Service: General;  Laterality: N/A;   LASIK Bilateral 2002   LYSIS OF ADHESION N/A 11/17/2017   Procedure: LYSIS OF ADHESION;  Surgeon: Glenna Fellows, MD;  Location: [facility];  Service: General;  Laterality: N/A;   ROUX-EN-Y GASTRIC BYPASS  AUG 2013   STAPEDES SURGERY Left 1985;  1994 x2;  02-03-1998   STRABISMUS SURGERY Right    VAGINAL HYSTERECTOMY  2007   fibroids     Home Medications Prior to Admission medications   Medication Sig Start Date End Date Taking? Authorizing Provider  acetaminophen (TYLENOL) 325 MG tablet Take 325 mg by mouth daily as needed for headache.    [provider]  busPIRone (BUSPAR) 7.5 MG tablet Take 1 tablet (7.5 mg total) by mouth 2 (two) times daily as needed (anxiety). 04/15/22   Mardelle Matte,  Malachi Bonds, MD  Cholecalciferol (VITAMIN D) 50 MCG (2000 UT) CAPS Take 2,000 Units by mouth daily.    [provider]  [facility] Liver Oil CAPS Take 1 capsule by mouth daily.    [provider]  cyanocobalamin (VITAMIN B12) 1000 MCG/ML injection Inject 1 mL (1,000 mcg total) into the muscle every 30 (thirty) days. 10/15/21   Willow Ora, MD  diazepam (VALIUM) 10 MG tablet Take 1 tablet (10 mg total) by mouth daily as needed for anxiety 05/10/22   Willow Ora, MD  doxycycline (VIBRA-TABS) 100 MG tablet Take 1 tablet by mouth twice a day 04/23/22     ferrous sulfate 325 (65 FE) MG tablet Take 325 mg by mouth daily with breakfast.    [provider]   ofloxacin (OCUFLOX) 0.3 % ophthalmic solution Place 1 drop into the right eye 4 (four) times daily for 7 days. 05/22/22 06/16/22    Potassium (POTASSIMIN PO) Take 1 tablet by mouth daily.    [provider]  Syringe/Needle, Disp, (SYRINGE 3CC/25GX1") 25G X 1" 3 ML MISC Use once monthly for injection of Vitamin B-12 06/28/21   Shelva Majestic, MD  traZODone (DESYREL) 100 MG tablet Take 1-1.5 tablets (100-150 mg total) by mouth at bedtime as needed for sleep. 02/24/22 02/24/23  Willow Ora, MD      Allergies    Lactose, Topiramate, and Alprazolam    Review of Systems   Review of Systems  Physical Exam Updated Vital Signs BP (!) 162/84   Pulse 63   Temp 98.2 F (36.8 C) (Oral)   Resp 18   Wt 55.8 kg   LMP 01/13/2005   SpO2 100%   BMI 24.02 kg/m  Physical Exam  ED Results / Procedures / Treatments   Labs (all labs ordered are listed, but only abnormal results are displayed) Labs Reviewed  COMPREHENSIVE METABOLIC PANEL - Abnormal; Notable for the following components:      Result Value   Glucose, Bld 104 (*)    Calcium 7.8 (*)    Total Protein 6.3 (*)    Total Bilirubin 0.2 (*)    All other components within normal limits  CBC - Abnormal; Notable for the following components:   WBC 3.9 (*)    Hemoglobin 11.7 (*)    HCT 33.7 (*)    All other components within normal limits  URINALYSIS, ROUTINE W REFLEX MICROSCOPIC - Abnormal; Notable for the following components:   Hgb urine dipstick SMALL (*)    All other components within normal limits  URINALYSIS, MICROSCOPIC (REFLEX) - Abnormal; Notable for the following components:   Bacteria, UA RARE (*)    All other components within normal limits  LIPASE, BLOOD    EKG None  Radiology CT ABDOMEN PELVIS W CONTRAST  Result Date: 05/26/2022 CLINICAL DATA:  Periumbilical pain EXAM: CT ABDOMEN AND PELVIS WITH CONTRAST TECHNIQUE: Multidetector CT imaging of the abdomen and pelvis was performed using the standard protocol  following bolus administration of intravenous contrast. RADIATION DOSE REDUCTION: This exam was performed according to the departmental dose-optimization program which includes automated exposure control, adjustment of the mA and/or kV according to patient size and/or use of iterative reconstruction technique. CONTRAST:  80mL OMNIPAQUE IOHEXOL 300 MG/ML  SOLN COMPARISON:  CT 08/15/2021, 12/13/2018 FINDINGS: Lower chest: Lung bases are clear. Hepatobiliary: Right hepatic cysts inferiorly. Status post cholecystectomy. No biliary dilatation Pancreas: Unremarkable. No pancreatic ductal dilatation or surrounding inflammatory changes. Spleen: Normal in size without focal  abnormality. Adrenals/Urinary Tract: Adrenal glands are normal. Kidneys show no hydronephrosis. Bilateral renal cysts, no imaging follow-up is recommended. Bladder is normal Stomach/Bowel: Status post gastric bypass. No dilated small bowel. No acute bowel wall thickening. Negative appendix. Vascular/Lymphatic: Nonaneurysmal aorta.  No suspicious lymph nodes. Reproductive: Hysterectomy.  No adnexal mass Other: Negative for free air or free fluid Musculoskeletal: No acute or suspicious osseous abnormality IMPRESSION: No CT evidence for acute intra-abdominal or pelvic abnormality. Electronically Signed   By: Jasmine Pang M.D.   On: 05/26/2022 21:13    Procedures Procedures  {Document cardiac monitor, telemetry assessment procedure when appropriate:1}  Medications Ordered in ED Medications  dicyclomine (BENTYL) injection 20 mg (has no administration in time range)  ondansetron (ZOFRAN) injection 4 mg (has no administration in time range)  iohexol (OMNIPAQUE) 300 MG/ML solution 80 mL (80 mLs Intravenous Contrast Given 05/26/22 2047)    ED Course/ Medical Decision Making/ A&P   {   Click here for ABCD2, HEART and other calculatorsREFRESH Note before signing :1}                          Medical Decision Making Amount and/or Complexity of Data  Reviewed Labs: ordered. Radiology: ordered.  Risk Prescription drug management.   ***  {Document critical care time when appropriate:1} {Document review of labs and clinical decision tools ie heart score, Chads2Vasc2 etc:1}  {Document your independent review of radiology images, and any outside records:1} {Document your discussion with family members, caretakers, and with consultants:1} {Document social determinants of health affecting pt's care:1} {Document your decision making why or why not admission, treatments were needed:1} Final Clinical Impression(s) / ED Diagnoses Final diagnoses:  None    Rx / DC Orders ED Discharge Orders     None

## 2022-05-26 NOTE — ED Triage Notes (Signed)
Hx bowel obstruction she said , abd pain  . Noticed blood clots with BM , bright red blood when wiping.

## 2022-05-26 NOTE — ED Notes (Signed)
Pt A&OX4, verbalized understanding of d/c instructions and follow up care.

## 2022-05-27 ENCOUNTER — Other Ambulatory Visit (HOSPITAL_COMMUNITY): Payer: Self-pay

## 2022-05-27 ENCOUNTER — Other Ambulatory Visit: Payer: Self-pay

## 2022-05-28 DIAGNOSIS — I700 Atherosclerosis of aorta: Secondary | ICD-10-CM | POA: Diagnosis not present

## 2022-05-28 DIAGNOSIS — F419 Anxiety disorder, unspecified: Secondary | ICD-10-CM | POA: Diagnosis not present

## 2022-05-28 DIAGNOSIS — F325 Major depressive disorder, single episode, in full remission: Secondary | ICD-10-CM | POA: Diagnosis not present

## 2022-05-28 DIAGNOSIS — Z6824 Body mass index (BMI) 24.0-24.9, adult: Secondary | ICD-10-CM | POA: Diagnosis not present

## 2022-05-28 DIAGNOSIS — G4700 Insomnia, unspecified: Secondary | ICD-10-CM | POA: Diagnosis not present

## 2022-05-28 DIAGNOSIS — Z008 Encounter for other general examination: Secondary | ICD-10-CM | POA: Diagnosis not present

## 2022-05-30 ENCOUNTER — Other Ambulatory Visit: Payer: Self-pay

## 2022-05-31 ENCOUNTER — Other Ambulatory Visit (HOSPITAL_COMMUNITY): Payer: Self-pay

## 2022-06-02 ENCOUNTER — Other Ambulatory Visit: Payer: Self-pay

## 2022-06-04 ENCOUNTER — Other Ambulatory Visit (HOSPITAL_COMMUNITY): Payer: Self-pay | Admitting: Emergency Medicine

## 2022-06-10 ENCOUNTER — Other Ambulatory Visit: Payer: Self-pay

## 2022-06-10 ENCOUNTER — Other Ambulatory Visit (HOSPITAL_COMMUNITY): Payer: Self-pay

## 2022-06-23 ENCOUNTER — Other Ambulatory Visit (HOSPITAL_COMMUNITY): Payer: Self-pay

## 2022-06-24 ENCOUNTER — Other Ambulatory Visit: Payer: Self-pay

## 2022-06-24 ENCOUNTER — Ambulatory Visit: Payer: No Typology Code available for payment source | Admitting: Family Medicine

## 2022-06-24 ENCOUNTER — Other Ambulatory Visit (HOSPITAL_COMMUNITY): Payer: Self-pay

## 2022-06-24 NOTE — Progress Notes (Signed)
This encounter was created in error - please disregard.

## 2022-07-03 ENCOUNTER — Other Ambulatory Visit (HOSPITAL_COMMUNITY): Payer: Self-pay

## 2022-07-03 ENCOUNTER — Other Ambulatory Visit (HOSPITAL_COMMUNITY): Payer: Self-pay | Admitting: Emergency Medicine

## 2022-07-04 ENCOUNTER — Other Ambulatory Visit (HOSPITAL_COMMUNITY): Payer: Self-pay

## 2022-07-04 ENCOUNTER — Other Ambulatory Visit: Payer: Self-pay | Admitting: Family Medicine

## 2022-07-05 ENCOUNTER — Other Ambulatory Visit: Payer: Self-pay | Admitting: Family Medicine

## 2022-07-07 ENCOUNTER — Other Ambulatory Visit: Payer: Self-pay

## 2022-07-07 ENCOUNTER — Other Ambulatory Visit (HOSPITAL_COMMUNITY): Payer: Self-pay

## 2022-07-07 MED ORDER — DICYCLOMINE HCL 20 MG PO TABS
20.0000 mg | ORAL_TABLET | Freq: Two times a day (BID) | ORAL | 0 refills | Status: DC | PRN
  Filled 2022-07-07: qty 20, 10d supply, fill #0

## 2022-07-07 MED ORDER — TRAZODONE HCL 100 MG PO TABS
100.0000 mg | ORAL_TABLET | Freq: Every evening | ORAL | 3 refills | Status: DC | PRN
  Filled 2022-07-07: qty 45, 30d supply, fill #0
  Filled 2022-08-05: qty 45, 30d supply, fill #1
  Filled 2022-08-31: qty 45, 30d supply, fill #2
  Filled 2022-09-27 – 2022-09-30 (×2): qty 45, 30d supply, fill #3

## 2022-07-08 DIAGNOSIS — Z822 Family history of deafness and hearing loss: Secondary | ICD-10-CM | POA: Diagnosis not present

## 2022-07-08 DIAGNOSIS — H9313 Tinnitus, bilateral: Secondary | ICD-10-CM | POA: Diagnosis not present

## 2022-07-08 DIAGNOSIS — H903 Sensorineural hearing loss, bilateral: Secondary | ICD-10-CM | POA: Diagnosis not present

## 2022-07-10 ENCOUNTER — Other Ambulatory Visit (HOSPITAL_BASED_OUTPATIENT_CLINIC_OR_DEPARTMENT_OTHER): Payer: Self-pay

## 2022-07-10 ENCOUNTER — Other Ambulatory Visit (HOSPITAL_COMMUNITY): Payer: Self-pay

## 2022-07-15 DIAGNOSIS — H00024 Hordeolum internum left upper eyelid: Secondary | ICD-10-CM | POA: Diagnosis not present

## 2022-07-21 ENCOUNTER — Other Ambulatory Visit (HOSPITAL_BASED_OUTPATIENT_CLINIC_OR_DEPARTMENT_OTHER): Payer: Self-pay

## 2022-07-21 ENCOUNTER — Ambulatory Visit (INDEPENDENT_AMBULATORY_CARE_PROVIDER_SITE_OTHER): Payer: No Typology Code available for payment source | Admitting: Physician Assistant

## 2022-07-21 VITALS — BP 146/86 | HR 68 | Temp 97.7°F | Ht 60.0 in | Wt 125.2 lb

## 2022-07-21 DIAGNOSIS — N390 Urinary tract infection, site not specified: Secondary | ICD-10-CM

## 2022-07-21 DIAGNOSIS — R319 Hematuria, unspecified: Secondary | ICD-10-CM

## 2022-07-21 DIAGNOSIS — R300 Dysuria: Secondary | ICD-10-CM | POA: Diagnosis not present

## 2022-07-21 DIAGNOSIS — R350 Frequency of micturition: Secondary | ICD-10-CM | POA: Insufficient documentation

## 2022-07-21 DIAGNOSIS — R109 Unspecified abdominal pain: Secondary | ICD-10-CM | POA: Diagnosis not present

## 2022-07-21 LAB — POC URINALSYSI DIPSTICK (AUTOMATED)
Bilirubin, UA: NEGATIVE
Blood, UA: POSITIVE
Glucose, UA: NEGATIVE
Ketones, UA: NEGATIVE
Nitrite, UA: POSITIVE
Protein, UA: POSITIVE — AB
Spec Grav, UA: 1.025 (ref 1.010–1.025)
Urobilinogen, UA: 0.2 E.U./dL
pH, UA: 6 (ref 5.0–8.0)

## 2022-07-21 MED ORDER — ONDANSETRON HCL 4 MG PO TABS
4.0000 mg | ORAL_TABLET | Freq: Three times a day (TID) | ORAL | 0 refills | Status: DC | PRN
  Filled 2022-07-21: qty 20, 7d supply, fill #0

## 2022-07-21 MED ORDER — CEFTRIAXONE SODIUM 1 G IJ SOLR
1.00 g | Freq: Once | INTRAMUSCULAR | Status: AC
Start: 2022-07-21 — End: 2022-07-21
  Administered 2022-07-21: 1 g via INTRAMUSCULAR

## 2022-07-21 MED ORDER — CEPHALEXIN 500 MG PO CAPS
500.0000 mg | ORAL_CAPSULE | Freq: Four times a day (QID) | ORAL | 0 refills | Status: AC
  Filled 2022-07-21: qty 40, 10d supply, fill #0

## 2022-07-21 NOTE — Progress Notes (Signed)
Subjective:    Patient ID: Denise Rice, female    DOB: 09-25-1950, 72 y.o.   MRN: [id number]  Chief Complaint  Patient presents with   Urinary Tract Infection    Pt in office and c/o possible UTI; pt said she had frequent urination last week, urinary pressure, and lower abdominal pain Saturday. Took AZO yesterday and prior 3 doses of another medication she had left over. Pt states she is super nauseous and in pain with lower abdomen    Urinary Tract Infection    Patient is in today for possible UTI. Started last week with urinary frequency. Two days ago, symptoms acutely worse with pressure, burning, nausea, lower abdominal pain. Took some AZO with minimal relief. Today having back and stomach pains. Very nauseated. Feels flu-like. No fevers. Taking Tylenol.   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    craniotomy for repair of R ear   Vertigo    Vitamin B12 deficiency 07/29/2018   Started IM injections monthly 06/2018   Vitamin D deficiency     Past Surgical History:  Procedure Laterality Date   BLEPHAROPLASTY  03-05-2000   CRANIOTOMY  08/2014   to repair dehescence in right ear   HAMMER TOE SURGERY Left 08-29-2013   2nd toe   IMPLANTATION BONE ANCHORED HEARING AID Left 07/27/2008   left temporal bone;now removed   LAPAROSCOPIC CHOLECYSTECTOMY  03-31-2005   LAPAROSCOPIC GASTRIC BANDING  02-08-2007   LAPAROSCOPY N/A 11/17/2017   Procedure:  LAPAROSCOPY LYSIS OF ADHESIONS FOR SMALL BOWEL OBSTRUCTION;  Surgeon: Glenna Fellows, MD;  Location: [facility];  Service: General;  Laterality: N/A;   LASER ABLATION CONDOLAMATA N/A 07/30/2012   Procedure: LASER ABLATION CONDOLAMATA;  Surgeon: Romie Levee, MD;  Location: [facility];  Service: General;  Laterality: N/A;   LASIK Bilateral 2002   LYSIS OF ADHESION N/A 11/17/2017   Procedure: LYSIS OF ADHESION;  Surgeon: Glenna Fellows, MD;  Location: [facility];  Service: General;  Laterality: N/A;   ROUX-EN-Y GASTRIC BYPASS  AUG 2013   STAPEDES SURGERY Left 1985;  1994 x2;  02-03-1998   STRABISMUS SURGERY Right    VAGINAL HYSTERECTOMY  2007   fibroids    Family History  Problem Relation Age of Onset   Heart attack Mother    Heart disease Mother    Diabetes Mother    Hypertension Mother    Hyperlipidemia Mother    Stroke Mother    Kidney disease Mother    Thyroid disease Mother    Anxiety disorder Mother    Obesity Mother    Cancer - Prostate Father    Diabetes Father    Hypertension Father    Hyperlipidemia Father    Heart disease Father    Stroke Father    Kidney disease Father    Obesity Father    Stroke Sister  Diabetes Sister    High blood pressure Sister    Diabetes Maternal Aunt    Heart disease Maternal Aunt    Cancer Paternal Aunt    Cancer Paternal Uncle    Colon cancer Neg Hx    Colon polyps Neg Hx    Breast cancer Neg Hx     Social History   Tobacco Use   Smoking status: Never   Smokeless tobacco: Never  Vaping Use   Vaping Use: Never used  Substance Use Topics   Alcohol use: No    Alcohol/week: 0.0 standard drinks of alcohol   Drug use: No     Allergies  Allergen Reactions   Lactose Diarrhea   Topiramate Nausea Only   Alprazolam Nausea Only    Review of Systems NEGATIVE UNLESS OTHERWISE INDICATED IN HPI      Objective:     BP (!) 146/86 (BP Location: Left Arm)   Pulse 68   Temp 97.7 F (36.5 C) (Temporal)   Ht 5'  (1.524 m)   Wt 125 lb 3.2 oz (56.8 kg)   LMP 01/13/2005   SpO2 98%   BMI 24.45 kg/m   Wt Readings from Last 3 Encounters:  07/21/22 125 lb 3.2 oz (56.8 kg)  05/26/22 123 lb (55.8 kg)  03/21/22 120 lb (54.4 kg)    BP Readings from Last 3 Encounters:  07/21/22 (!) 146/86  05/26/22 120/66  03/21/22 118/74     Physical Exam Vitals and nursing note reviewed.  Constitutional:      General: She is not in acute distress.    Appearance: Normal appearance. She is not ill-appearing.  HENT:     Head: Normocephalic and atraumatic.  Cardiovascular:     Rate and Rhythm: Normal rate and regular rhythm.     Pulses: Normal pulses.     Heart sounds: Normal heart sounds.  Pulmonary:     Effort: Pulmonary effort is normal.     Breath sounds: Normal breath sounds.  Abdominal:     General: Abdomen is flat. Bowel sounds are normal.     Palpations: Abdomen is soft.     Tenderness: There is abdominal tenderness in the suprapubic area. There is right CVA tenderness and left CVA tenderness.  Skin:    General: Skin is warm and dry.  Neurological:     General: No focal deficit present.     Mental Status: She is alert.  Psychiatric:        Mood and Affect: Mood normal.        Assessment & Plan:  Urinary tract infection with hematuria, site unspecified -     Urine Culture -     cefTRIAXone Sodium  Dysuria -     POCT Urinalysis Dipstick (Automated) -     Urine Culture  Flank pain -     Urine Culture  Other orders -     Cephalexin; Take 1 capsule (500 mg total) by mouth 4 (four) times daily for 10 days.  Dispense: 40 capsule; Refill: 0 -     Ondansetron HCl; Take 1 tablet (4 mg total) by mouth every 8 (eight) hours as needed for nausea or vomiting.  Dispense: 20 tablet; Refill: 0   Discussed with patient UA results are indicative on infection today. With her flank pain, may be early pyelonephritis vs complicated cystitis. Gave Rocephin 1 g IM in office today, will start on cephalexin  po at home. Zofran prn nausea. Push fluids, rest, Tylenol for pain. Monitor  symptoms closely. Call with update tomorrow. If acutely worse - needs ER, pt agreeable.      Return if symptoms worsen or fail to improve.    Shirl Weir M Tawana Pasch, PA-C

## 2022-07-22 LAB — URINE CULTURE: MICRO NUMBER:: 15170607

## 2022-07-24 LAB — URINE CULTURE: SPECIMEN QUALITY:: ADEQUATE

## 2022-07-29 ENCOUNTER — Other Ambulatory Visit (HOSPITAL_COMMUNITY): Payer: Self-pay

## 2022-08-05 ENCOUNTER — Other Ambulatory Visit: Payer: Self-pay

## 2022-08-27 ENCOUNTER — Other Ambulatory Visit: Payer: Self-pay

## 2022-08-27 ENCOUNTER — Other Ambulatory Visit (HOSPITAL_COMMUNITY): Payer: Self-pay

## 2022-08-27 MED ORDER — CEPHALEXIN 500 MG PO CAPS
1000.0000 mg | ORAL_CAPSULE | ORAL | 0 refills | Status: DC
  Filled 2022-08-27 (×2): qty 2, 1d supply, fill #0

## 2022-08-28 ENCOUNTER — Other Ambulatory Visit (HOSPITAL_COMMUNITY): Payer: Self-pay

## 2022-08-28 ENCOUNTER — Encounter (INDEPENDENT_AMBULATORY_CARE_PROVIDER_SITE_OTHER): Payer: Self-pay

## 2022-08-29 ENCOUNTER — Other Ambulatory Visit: Payer: Self-pay | Admitting: Oncology

## 2022-08-29 ENCOUNTER — Other Ambulatory Visit (HOSPITAL_COMMUNITY): Payer: Self-pay

## 2022-08-29 ENCOUNTER — Other Ambulatory Visit: Payer: Self-pay | Admitting: Family Medicine

## 2022-08-29 ENCOUNTER — Other Ambulatory Visit: Payer: Self-pay

## 2022-08-29 DIAGNOSIS — Z006 Encounter for examination for normal comparison and control in clinical research program: Secondary | ICD-10-CM

## 2022-08-29 MED ORDER — HYDROCODONE-ACETAMINOPHEN 10-325 MG PO TABS
0.5000 | ORAL_TABLET | Freq: Four times a day (QID) | ORAL | 0 refills | Status: DC | PRN
  Filled 2022-08-29: qty 5, 3d supply, fill #0

## 2022-08-29 MED ORDER — CEPHALEXIN 500 MG PO CAPS
500.0000 mg | ORAL_CAPSULE | Freq: Two times a day (BID) | ORAL | 0 refills | Status: DC
  Filled 2022-08-29: qty 10, 5d supply, fill #0

## 2022-09-01 ENCOUNTER — Other Ambulatory Visit (HOSPITAL_COMMUNITY): Payer: Self-pay

## 2022-09-01 ENCOUNTER — Other Ambulatory Visit: Payer: Self-pay

## 2022-09-01 NOTE — Telephone Encounter (Signed)
This is a duplicate and it wont let me close it, can you close it please?

## 2022-09-02 ENCOUNTER — Other Ambulatory Visit: Payer: Self-pay

## 2022-09-18 ENCOUNTER — Telehealth: Payer: Self-pay | Admitting: Family Medicine

## 2022-09-18 ENCOUNTER — Encounter: Payer: Self-pay | Admitting: Family Medicine

## 2022-09-18 NOTE — Telephone Encounter (Signed)
Patient called back stating that triage informed her to be seen by PCP within 24 hours. Due to PCP being out of office, patient has been scheduled with Dulce Sellar @ 1 pm on 09/19/22. Awaiting triage note.

## 2022-09-18 NOTE — Telephone Encounter (Signed)
FYI: This call has been transferred to triage nurse: the Triage Nurse. Once the result note has been entered staff can address the message at that time.  Patient called in with the following symptoms:  Red Word:On 09/17/22 pm: elevated blood pressure 164/83, extreme dizziness, head buzzing, vision grayed out except for two pinholes of vision 09/18/22 am-Dull headache mostly over left eye Patient requested to speak with Triage Nurse   Please advise at Mobile [phone] (mobile)  Message is routed to Provider Pool.

## 2022-09-18 NOTE — Telephone Encounter (Signed)
Noted  

## 2022-09-18 NOTE — Telephone Encounter (Signed)
Patient Name First: Denise Last: Rice Gender: Female DOB: 07/22/1950 Age: 72 Y 10 M 2 D Return Phone Number: [phone] (Primary) Address: City/ State/ Zip: [place]  [address] Client [facility] - Administrator, [facility] Provider Asencion Partridge- MD Contact Type Call Who Is Calling Patient / Member / Family / Caregiver Call Type Triage / Clinical Relationship To Patient Self Return Phone Number [phone] (Primary) Chief Complaint VISION - sudden loss or sudden decrease (NOT blurred vision) Reason for Call Symptomatic / Request for Health Information Initial Comment Caller states that her blood pressure is 164/83, extremely dizzy, dull headache, decreased vision. Caller hung up before connecting the call. Translation No Nurse Assessment Nurse: Caryn Bee, RN, Ayrine Date/Time (Eastern Time): 09/18/2022 8:56:54 AM Confirm and document reason for call. If symptomatic, describe symptoms. ---Caller states she got really dizzy, had a headache yesterday at work and high blood pressure. Currently, she still has a headache that is mostly on her left eye. Does the patient have any new or worsening symptoms? ---Yes Will a triage be completed? ---Yes Related visit to physician within the last 2 weeks? ---No Does the PT have any chronic conditions? (i.e. diabetes, asthma, this includes High risk factors for pregnancy, etc.) ---Yes List chronic conditions. ---Vertigo. Bowel issues. Is this a behavioral health or substance abuse call? ---No Guidelines Guideline Title Affirmed Question Affirmed Notes Nurse Date/Time (Eastern Time) Headache [1] New headache AND [2] age > 12 Jurovschi, RN, Ayrine 09/18/2022 8:59:15 AM Disp. Time Lamount Cohen Time) Disposition Final User 09/18/2022 8:51:26 AM Send to Urgent Queue Mikael Spray, [place] [facility]. Time Lamount Cohen Time) Disposition Final User 09/18/2022 9:03:41 AM See PCP within 24  Hours Yes Jurovschi, RN, [place] [facility] Final Disposition 09/18/2022 9:03:41 AM See PCP within 24 Hours Yes Jurovschi, RN, [place] [facility] Caller Disagree/Comply Comply Caller Understands Yes PreDisposition Call Doctor Care Advice Given Per Guideline SEE PCP WITHIN 24 HOURS: * IF OFFICE WILL BE OPEN: You need to be examined within the next 24 hours. Call your doctor (or NP/PA) when the office opens and make an appointment. CALL BACK IF: * Blurred vision or double vision occurs * Numbness or weakness of the face, arm or leg * Difficulty with speaking * You become worse CARE ADVICE given per Headache (Adult) guideline.

## 2022-09-19 ENCOUNTER — Telehealth: Payer: Self-pay | Admitting: Family

## 2022-09-19 ENCOUNTER — Ambulatory Visit (INDEPENDENT_AMBULATORY_CARE_PROVIDER_SITE_OTHER): Payer: No Typology Code available for payment source | Admitting: Family

## 2022-09-19 ENCOUNTER — Other Ambulatory Visit (HOSPITAL_COMMUNITY): Payer: Self-pay

## 2022-09-19 ENCOUNTER — Encounter: Payer: Self-pay | Admitting: Family

## 2022-09-19 VITALS — BP 138/85 | HR 68 | Temp 98.0°F | Ht 60.0 in | Wt 125.2 lb

## 2022-09-19 DIAGNOSIS — Z823 Family history of stroke: Secondary | ICD-10-CM

## 2022-09-19 DIAGNOSIS — I10 Essential (primary) hypertension: Secondary | ICD-10-CM | POA: Diagnosis not present

## 2022-09-19 DIAGNOSIS — R42 Dizziness and giddiness: Secondary | ICD-10-CM | POA: Diagnosis not present

## 2022-09-19 DIAGNOSIS — G441 Vascular headache, not elsewhere classified: Secondary | ICD-10-CM | POA: Diagnosis not present

## 2022-09-19 MED ORDER — LOSARTAN POTASSIUM 25 MG PO TABS
25.0000 mg | ORAL_TABLET | Freq: Every day | ORAL | 2 refills | Status: DC
Start: 2022-09-19 — End: 2022-09-22
  Filled 2022-09-19: qty 60, 60d supply, fill #0

## 2022-09-19 NOTE — Telephone Encounter (Signed)
Patient requesting referral for Neurology to be entered based off of visit today.

## 2022-09-19 NOTE — Patient Instructions (Addendum)
It was very nice to see you today!   I am ordering a MRI scan of your head as urgently as possible. I have sent over Losartan for your blood pressure to take twice a day, start this today. I also recommend taking 1/2 pill of your Valium = 5mg  to help calm your nerves but not make you too drowsy until we can get the MRI done. You can start taking a baby Aspirin daily for now until we get the MRI results. Drink at least 2 liters of water daily & eat a low sodium diet. Let us know if your blood pressure is still running high at home. If your headache becomes severe again, with dizziness, vision loss, numbness in your face, trouble with speech, then dial 911.       PLEASE NOTE:  If you had any lab tests please let us know if you have not heard back within a few days. You may see your results on MyChart before we have a chance to review them but we will give you a call once they are reviewed by [place]. If we ordered any referrals today, please let us know if you have not heard from their office within the next week.

## 2022-09-19 NOTE — Progress Notes (Signed)
Patient ID: Denise Rice, female    DOB: 04-Apr-1950, 72 y.o.   MRN: [id number]  Chief Complaint  Patient presents with   Dizziness    Pt c/o dizziness, headache, and blurred vision on 9/4  while at work. BP was 158/86 and then 164/83, BP went down after taking a valium.     HPI:      Dizziness w/high BP:  Pt c/o dizziness, headache, slowed speech, and vision loss, but no syncope on 9/4 while at work. She lied down for awhile, but did not got to the ED. Her BP was 158/86 and then 164/83, BP went down after taking a valium. But home readings have ranged 158-165/80-90 since then and she reports a continued headache.  Assessment & Plan:  1. Essential (primary) hypertension starting Losartan 25mg  bid, advised pt to continue monitoring BP at home and let PCP know if not coming down. Advised on increased water intake, low sodium diet.   - losartan (COZAAR) 25 MG tablet; Take 1 tablet (25 mg total) by mouth daily.  Dispense: 60 tablet; Refill: 2  2. Dizziness advised on hydration, not skipping meals. Checking head MRI r/o mass, bleed, TIA.  - Ambulatory referral to Neurology - MR BRAIN W WO CONTRAST; Future  3. Other vascular headache believe r/t new HTN, but checking head MRI r/o mass, bleed, TIA. pt very anxious that she has had a TIA d/t family hx. tried to reassure pt she is low risk other than her age & fam hx.  - Ambulatory referral to Neurology - MR BRAIN W WO CONTRAST; Future  4. Family history of cerebrovascular accident (CVA) in sister also mother and father.   - Ambulatory referral to Neurology - MR BRAIN W WO CONTRAST; Future   Subjective:    Outpatient Medications Prior to Visit  Medication Sig Dispense Refill   acetaminophen (TYLENOL) 325 MG tablet Take 325 mg by mouth daily as needed for headache.     busPIRone (BUSPAR) 7.5 MG tablet Take 1 tablet (7.5 mg total) by mouth 2 (two) times daily as needed (anxiety). 60 tablet 5   Cholecalciferol (VITAMIN D) 50 MCG (2000  UT) CAPS Take 2,000 Units by mouth daily.     Cod Liver Oil CAPS Take 1 capsule by mouth daily.     cyanocobalamin (VITAMIN B12) 1000 MCG/ML injection Inject 1 mL (1,000 mcg total) into the muscle every 30 (thirty) days. 10 mL 2   diazepam (VALIUM) 10 MG tablet Take 1 tablet (10 mg total) by mouth daily as needed for anxiety 30 tablet 5   dicyclomine (BENTYL) 20 MG tablet Take 1 tablet (20 mg total) by mouth 2 (two) times daily as needed for spasms (abdominal pain). 20 tablet 0   ferrous sulfate 325 (65 FE) MG tablet Take 325 mg by mouth daily with breakfast.     ondansetron (ZOFRAN) 4 MG tablet Take 1 tablet (4 mg total) by mouth every 8 (eight) hours as needed for nausea or vomiting. 20 tablet 0   Potassium (POTASSIMIN PO) Take 1 tablet by mouth daily.     Syringe/Needle, Disp, (SYRINGE 3CC/25GX1") 25G X 1" 3 ML MISC Use once monthly for injection of Vitamin B-12 50 each 0   traZODone (DESYREL) 100 MG tablet Take 1-1.5 tablets (100-150 mg total) by mouth at bedtime as needed for sleep. 45 tablet 3   cephALEXin (KEFLEX) 500 MG capsule Take 1 capsule (500 mg total) by mouth 2 (two) times daily until gone. 10 capsule  0   HYDROcodone-acetaminophen (NORCO) 10-325 MG tablet Take 0.5 tablets by mouth every 6 (six) hours as needed for pain. 5 tablet 0   polyethylene glycol powder (MIRALAX) 17 GM/SCOOP powder Take 17 g by mouth daily. Mix in 8 oz of liquid and drink 238 g 0   No facility-administered medications prior to visit.   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    craniotomy for repair of R ear   Vertigo    Vitamin B12 deficiency 07/29/2018   Started IM injections monthly 06/2018   Vitamin D deficiency    Past Surgical History:  Procedure Laterality Date   BLEPHAROPLASTY  03-05-2000   CRANIOTOMY  08/2014   to repair dehescence in right ear   HAMMER TOE SURGERY Left 08-29-2013   2nd toe   IMPLANTATION BONE ANCHORED HEARING AID Left 07/27/2008   left temporal bone;now removed   LAPAROSCOPIC CHOLECYSTECTOMY  03-31-2005   LAPAROSCOPIC GASTRIC BANDING  02-08-2007   LAPAROSCOPY N/A 11/17/2017   Procedure: LAPAROSCOPY LYSIS OF ADHESIONS FOR SMALL BOWEL OBSTRUCTION;  Surgeon: Glenna Fellows, MD;  Location: [facility];  Service: General;  Laterality: N/A;   LASER ABLATION CONDOLAMATA N/A 07/30/2012   Procedure: LASER ABLATION CONDOLAMATA;  Surgeon: Romie Levee, MD;  Location: [facility];  Service: General;  Laterality: N/A;   LASIK Bilateral 2002   LYSIS OF ADHESION N/A 11/17/2017   Procedure: LYSIS OF ADHESION;  Surgeon: Glenna Fellows, MD;  Location: [facility];  Service: General;  Laterality: N/A;   ROUX-EN-Y GASTRIC BYPASS  AUG 2013   STAPEDES SURGERY Left 1985;  1994 x2;  02-03-1998   STRABISMUS SURGERY Right    VAGINAL HYSTERECTOMY  2007   fibroids   Allergies  Allergen Reactions   Lactose Diarrhea   Topiramate Nausea Only   Alprazolam Nausea Only      Objective:    Physical Exam Vitals and nursing note reviewed.  Constitutional:      Appearance: Normal appearance.  Cardiovascular:     Rate and Rhythm: Normal rate and regular rhythm.  Pulmonary:     Effort: Pulmonary effort is normal.     Breath sounds: Normal breath sounds.  Musculoskeletal:        General: Normal range of motion.  Skin:    General: Skin is warm and dry.  Neurological:     Mental Status: She is alert.  Psychiatric:        Mood and Affect: Mood  normal.        Behavior: Behavior normal.   BP 138/85 (BP Location: Right Arm, Patient Position: Sitting, Cuff Size: Normal)   Pulse 68   Temp 98 F (36.7 C) (Temporal)   Ht 5' (1.524 m)   Wt 125 lb 3.2 oz (56.8 kg)   LMP 01/13/2005   SpO2 100%   BMI 24.45 kg/m  Wt  Readings from Last 3 Encounters:  09/19/22 125 lb 3.2 oz (56.8 kg)  07/21/22 125 lb 3.2 oz (56.8 kg)  05/26/22 123 lb (55.8 kg)      Dulce Sellar, NP

## 2022-09-20 ENCOUNTER — Encounter: Payer: Self-pay | Admitting: Family

## 2022-09-20 DIAGNOSIS — I10 Essential (primary) hypertension: Secondary | ICD-10-CM

## 2022-09-22 ENCOUNTER — Other Ambulatory Visit (HOSPITAL_COMMUNITY): Payer: Self-pay

## 2022-09-22 MED ORDER — LOSARTAN POTASSIUM 25 MG PO TABS
25.0000 mg | ORAL_TABLET | Freq: Two times a day (BID) | ORAL | 2 refills | Status: DC
Start: 2022-09-22 — End: 2022-11-06
  Filled 2022-09-22 – 2022-09-27 (×3): qty 60, 30d supply, fill #0

## 2022-09-22 NOTE — Telephone Encounter (Signed)
Please see patient message and advise.

## 2022-09-22 NOTE — Telephone Encounter (Signed)
Please see pt response.

## 2022-09-22 NOTE — Telephone Encounter (Signed)
Please see and advise correct dosage

## 2022-09-23 ENCOUNTER — Other Ambulatory Visit: Payer: Self-pay

## 2022-09-24 ENCOUNTER — Telehealth: Payer: Self-pay | Admitting: Family

## 2022-09-24 NOTE — Telephone Encounter (Signed)
Left patient vm that PA for CT has been processed through.   Patient is scheduled.  I have also advised, referral is entered for neurology and can follow up with [facility] to get scheduled.

## 2022-09-25 ENCOUNTER — Encounter: Payer: Self-pay | Admitting: Family

## 2022-09-27 ENCOUNTER — Other Ambulatory Visit: Payer: Self-pay | Admitting: Family Medicine

## 2022-09-27 ENCOUNTER — Other Ambulatory Visit (HOSPITAL_COMMUNITY): Payer: Self-pay

## 2022-09-29 ENCOUNTER — Ambulatory Visit (INDEPENDENT_AMBULATORY_CARE_PROVIDER_SITE_OTHER): Payer: No Typology Code available for payment source | Admitting: Family Medicine

## 2022-09-29 ENCOUNTER — Encounter: Payer: Self-pay | Admitting: Family Medicine

## 2022-09-29 ENCOUNTER — Other Ambulatory Visit: Payer: Self-pay

## 2022-09-29 ENCOUNTER — Other Ambulatory Visit (HOSPITAL_COMMUNITY): Payer: Self-pay

## 2022-09-29 VITALS — BP 132/78 | HR 64 | Temp 98.2°F | Ht 60.0 in | Wt 127.8 lb

## 2022-09-29 DIAGNOSIS — R42 Dizziness and giddiness: Secondary | ICD-10-CM | POA: Diagnosis not present

## 2022-09-29 DIAGNOSIS — R7301 Impaired fasting glucose: Secondary | ICD-10-CM

## 2022-09-29 DIAGNOSIS — R030 Elevated blood-pressure reading, without diagnosis of hypertension: Secondary | ICD-10-CM

## 2022-09-29 DIAGNOSIS — Z23 Encounter for immunization: Secondary | ICD-10-CM | POA: Diagnosis not present

## 2022-09-29 DIAGNOSIS — Z9884 Bariatric surgery status: Secondary | ICD-10-CM

## 2022-09-29 DIAGNOSIS — H53483 Generalized contraction of visual field, bilateral: Secondary | ICD-10-CM

## 2022-09-29 DIAGNOSIS — Z8669 Personal history of other diseases of the nervous system and sense organs: Secondary | ICD-10-CM

## 2022-09-29 MED ORDER — BUSPIRONE HCL 7.5 MG PO TABS
7.5000 mg | ORAL_TABLET | Freq: Two times a day (BID) | ORAL | 5 refills | Status: DC | PRN
  Filled 2022-09-29: qty 60, 30d supply, fill #0
  Filled 2022-10-26: qty 60, 30d supply, fill #1
  Filled 2022-11-24: qty 60, 30d supply, fill #2
  Filled 2022-12-22: qty 60, 30d supply, fill #3
  Filled 2023-01-20: qty 60, 30d supply, fill #4
  Filled 2023-02-16: qty 60, 30d supply, fill #5

## 2022-09-29 MED ORDER — MUPIROCIN 2 % EX OINT
1.0000 | TOPICAL_OINTMENT | Freq: Two times a day (BID) | CUTANEOUS | 0 refills | Status: AC
  Filled 2022-09-29: qty 22, 10d supply, fill #0

## 2022-09-29 NOTE — Progress Notes (Unsigned)
Subjective  CC:  Chief Complaint  Patient presents with   Fatigue    HPI: Denise Rice is a 72 y.o. female who presents to the office today to address the problems listed above in the chief complaint. See last notes. Had episode of acute onset vertigo/ dysequilibrium and tinnitus that lasted moments to minutes associated with tunneling of vision. Her blood pressure was mildly elevated after the acute episode. She does not have h/o htn. Bp at home since has been variable; APP started losartan 25 bid at that time.  Pt feels fine now except very fatigued.  No blood work was done. No recurrent neurologic sxs or vision changes. She has a brain MRI scheduled for the 21st and has a f/u with neurology scheduled.  She does have a history of chronic vertigo and migraines.   Assessment  1. Lightheadedness   2. Chronic vertigo   3. IFG (impaired fasting glucose)   4. 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 History of migraine   6. Elevated blood pressure reading without diagnosis of hypertension   7. Need for influenza vaccination   8. Tunnel vision, bilateral      Plan  Lightheadness and vertigo w/ visiton change:  ? Atypical migraine. Await MRI. To see neuro for clarification.  Stop losartan and monitor bps Check labs, r/o thyroid and anemia.  Recommend vit supplements and good nutrition.  Follow up: as scheduled.  11/06/2022  Orders Placed This Encounter  Procedures   Flu Vaccine Trivalent High Dose (Fluad)   CBC with Differential/Platelet   Comprehensive metabolic panel   TSH   Hemoglobin A1c   Meds ordered this encounter  Medications   mupirocin ointment (BACTROBAN) 2 %    Sig: Apply to affected areas topically 2 (two) times daily.    Dispense:  22 g    Refill:  0      I reviewed the patients updated PMH, FH, and SocHx.    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   busPIRone (BUSPAR) 7.5 MG tablet Take 1 tablet (7.5 mg total) by mouth 2 (two) times daily as needed (anxiety).   Cholecalciferol (VITAMIN D) 50 MCG (2000 UT) CAPS Take 2,000 Units by mouth daily.   Cod Liver Oil CAPS  Take 1 capsule by mouth daily.   cyanocobalamin (VITAMIN B12) 1000 MCG/ML injection Inject 1 mL (1,000 mcg total) into the muscle every 30 (thirty) days.   diazepam (VALIUM) 10 MG tablet Take 1 tablet (10 mg total) by mouth daily as needed for anxiety   dicyclomine (BENTYL) 20 MG tablet Take 1 tablet (20 mg total) by mouth 2 (two) times daily as needed for spasms (abdominal pain).   ferrous sulfate 325 (65 FE) MG tablet Take 325 mg by mouth daily with breakfast.   losartan (COZAAR) 25 MG tablet Take 1 tablet (25 mg total) by mouth in the morning and at bedtime.   ondansetron (ZOFRAN) 4 MG tablet Take 1 tablet (4 mg total) by mouth every 8 (eight) hours as needed for nausea or vomiting.   Potassium (POTASSIMIN PO) Take 1  tablet by mouth daily.   Syringe/Needle, Disp, (SYRINGE 3CC/25GX1") 25G X 1" 3 ML MISC Use once monthly for injection of Vitamin B-12   traZODone (DESYREL) 100 MG tablet Take 1-1.5 tablets (100-150 mg total) by mouth at bedtime as needed for sleep.    Allergies: Patient is allergic to lactose, topiramate, and alprazolam. Family History: Patient family history includes Anxiety disorder in her mother; Cancer in her paternal aunt and paternal uncle; Cancer - Prostate in her father; Diabetes in her father, maternal aunt, mother, and sister; Heart attack in her mother; Heart disease in her father, maternal aunt, and mother; High blood pressure in her sister; Hyperlipidemia in her father and mother; Hypertension in her father and mother; Kidney disease in her father and mother; Obesity in her father and mother; Stroke in her father, mother, and sister; Thyroid disease in her mother. Social History:  Patient  reports that she has never smoked. She has never used smokeless tobacco. She reports that she does not drink alcohol and does not use drugs.  Review of Systems: Constitutional: Negative for fever malaise or anorexia Cardiovascular: negative for chest pain Respiratory: negative for SOB or persistent cough Gastrointestinal: negative for abdominal pain  Objective  Vitals: BP 132/78   Pulse 64   Temp 98.2 F (36.8 C)   Ht 5' (1.524 m)   Wt 127 lb 12.8 oz (58 kg)   LMP 01/13/2005   SpO2 99%   BMI 24.96 kg/m  General: no acute distress , A&Ox3 HEENT: PEERL, conjunctiva normal, neck is supple Cardiovascular:  RRR without murmur or gallop.  Respiratory:  Good breath sounds bilaterally, CTAB with normal respiratory effort Skin:  Warm, no rashes Neuro: normal cranial nerves. Nl gait.   Commons side effects, risks, benefits, and alternatives for medications and treatment plan prescribed today were discussed, and the patient expressed understanding of the given instructions. Patient is  instructed to call or message via MyChart if he/she has any questions or concerns regarding our treatment plan. No barriers to understanding were identified. We discussed Red Flag symptoms and signs in detail. Patient expressed understanding regarding what to do in case of urgent or emergency type symptoms.  Medication list was reconciled, printed and provided to the patient in AVS. Patient instructions and summary information was reviewed with the patient as documented in the AVS. This note was prepared with assistance of Dragon voice recognition software. Occasional wrong-word or sound-a-like substitutions may have occurred due to the inherent limitations of voice recognition software

## 2022-09-30 ENCOUNTER — Other Ambulatory Visit (HOSPITAL_COMMUNITY): Payer: Self-pay

## 2022-09-30 ENCOUNTER — Encounter: Payer: Self-pay | Admitting: Family Medicine

## 2022-09-30 ENCOUNTER — Other Ambulatory Visit: Payer: Self-pay

## 2022-09-30 LAB — CBC WITH DIFFERENTIAL/PLATELET
Basophils Absolute: 0 10*3/uL (ref 0.0–0.1)
Basophils Relative: 0.9 % (ref 0.0–3.0)
Eosinophils Absolute: 0.1 10*3/uL (ref 0.0–0.7)
Eosinophils Relative: 2.1 % (ref 0.0–5.0)
HCT: 35.8 % — ABNORMAL LOW (ref 36.0–46.0)
Hemoglobin: 12.3 g/dL (ref 12.0–15.0)
Lymphocytes Relative: 33.4 % (ref 12.0–46.0)
Lymphs Abs: 1.4 10*3/uL (ref 0.7–4.0)
MCHC: 34.3 g/dL (ref 30.0–36.0)
MCV: 80.7 fl (ref 78.0–100.0)
Monocytes Absolute: 0.3 10*3/uL (ref 0.1–1.0)
Monocytes Relative: 8.1 % (ref 3.0–12.0)
Neutro Abs: 2.4 10*3/uL (ref 1.4–7.7)
Neutrophils Relative %: 55.5 % (ref 43.0–77.0)
Platelets: 182 10*3/uL (ref 150.0–400.0)
RBC: 4.43 Mil/uL (ref 3.87–5.11)
RDW: 13.8 % (ref 11.5–15.5)
WBC: 4.3 10*3/uL (ref 4.0–10.5)

## 2022-09-30 LAB — COMPREHENSIVE METABOLIC PANEL
ALT: 22 U/L (ref 0–35)
AST: 26 U/L (ref 0–37)
Albumin: 3.9 g/dL (ref 3.5–5.2)
Alkaline Phosphatase: 68 U/L (ref 39–117)
BUN: 16 mg/dL (ref 6–23)
CO2: 29 meq/L (ref 19–32)
Calcium: 8.2 mg/dL — ABNORMAL LOW (ref 8.4–10.5)
Chloride: 102 meq/L (ref 96–112)
Creatinine, Ser: 0.8 mg/dL (ref 0.40–1.20)
GFR: 74.94 mL/min (ref >60.00)
Glucose, Bld: 56 mg/dL — ABNORMAL LOW (ref 70–99)
Potassium: 3.7 meq/L (ref 3.5–5.1)
Sodium: 138 meq/L (ref 135–145)
Total Bilirubin: 0.4 mg/dL (ref 0.2–1.2)
Total Protein: 6.6 g/dL (ref 6.0–8.3)

## 2022-09-30 LAB — TSH: TSH: 1.66 u[IU]/mL (ref 0.35–5.50)

## 2022-10-02 ENCOUNTER — Encounter: Payer: Self-pay | Admitting: Family Medicine

## 2022-10-02 NOTE — Progress Notes (Signed)
See my chart note.

## 2022-10-04 ENCOUNTER — Ambulatory Visit (HOSPITAL_COMMUNITY): Payer: No Typology Code available for payment source

## 2022-10-04 ENCOUNTER — Encounter (HOSPITAL_COMMUNITY): Payer: Self-pay

## 2022-10-21 ENCOUNTER — Other Ambulatory Visit: Payer: Self-pay | Admitting: Family Medicine

## 2022-10-21 ENCOUNTER — Other Ambulatory Visit: Payer: Self-pay

## 2022-10-21 MED ORDER — CYANOCOBALAMIN 1000 MCG/ML IJ SOLN
1000.0000 ug | INTRAMUSCULAR | 2 refills | Status: DC
  Filled 2022-10-21: qty 1, 30d supply, fill #0
  Filled 2022-11-20: qty 1, 30d supply, fill #1
  Filled 2022-12-15: qty 1, 30d supply, fill #2
  Filled 2023-01-12: qty 1, 30d supply, fill #3
  Filled 2023-02-11: qty 1, 30d supply, fill #4
  Filled 2023-03-09: qty 1, 30d supply, fill #5
  Filled 2023-04-06: qty 1, 30d supply, fill #6
  Filled 2023-05-05: qty 1, 30d supply, fill #7
  Filled 2023-06-01: qty 1, 30d supply, fill #8
  Filled 2023-06-29: qty 1, 30d supply, fill #9
  Filled 2023-07-28: qty 1, 30d supply, fill #10
  Filled 2023-08-24: qty 1, 30d supply, fill #11
  Filled 2023-09-21: qty 1, 30d supply, fill #12
  Filled 2023-10-19: qty 1, 30d supply, fill #13

## 2022-10-22 DIAGNOSIS — X58XXXA Exposure to other specified factors, initial encounter: Secondary | ICD-10-CM | POA: Diagnosis not present

## 2022-10-22 DIAGNOSIS — S99922A Unspecified injury of left foot, initial encounter: Secondary | ICD-10-CM | POA: Diagnosis not present

## 2022-10-22 DIAGNOSIS — M79675 Pain in left toe(s): Secondary | ICD-10-CM | POA: Diagnosis not present

## 2022-10-22 DIAGNOSIS — S92425A Nondisplaced fracture of distal phalanx of left great toe, initial encounter for closed fracture: Secondary | ICD-10-CM | POA: Diagnosis not present

## 2022-10-26 ENCOUNTER — Other Ambulatory Visit: Payer: Self-pay | Admitting: Family Medicine

## 2022-10-26 ENCOUNTER — Other Ambulatory Visit (HOSPITAL_COMMUNITY): Payer: Self-pay

## 2022-10-27 ENCOUNTER — Other Ambulatory Visit (HOSPITAL_COMMUNITY): Payer: Self-pay

## 2022-10-27 ENCOUNTER — Other Ambulatory Visit: Payer: Self-pay

## 2022-10-27 MED ORDER — DIAZEPAM 10 MG PO TABS
10.0000 mg | ORAL_TABLET | Freq: Every day | ORAL | 5 refills | Status: DC
  Filled 2022-10-27: qty 30, 30d supply, fill #0
  Filled 2022-11-24: qty 30, 30d supply, fill #1
  Filled 2022-12-22 – 2022-12-24 (×2): qty 30, 30d supply, fill #2
  Filled 2023-01-19 – 2023-01-21 (×2): qty 30, 30d supply, fill #3
  Filled 2023-02-16 – 2023-02-18 (×2): qty 30, 30d supply, fill #4
  Filled 2023-03-14 – 2023-03-18 (×2): qty 30, 30d supply, fill #5

## 2022-10-27 MED ORDER — TRAZODONE HCL 100 MG PO TABS
100.0000 mg | ORAL_TABLET | Freq: Every evening | ORAL | 3 refills | Status: DC | PRN
  Filled 2022-10-27: qty 45, 30d supply, fill #0
  Filled 2022-11-24: qty 45, 30d supply, fill #1
  Filled 2022-12-22: qty 45, 30d supply, fill #2
  Filled 2023-01-20: qty 45, 30d supply, fill #3

## 2022-10-28 ENCOUNTER — Other Ambulatory Visit: Payer: Self-pay

## 2022-10-29 ENCOUNTER — Other Ambulatory Visit (HOSPITAL_COMMUNITY): Payer: Self-pay

## 2022-11-05 ENCOUNTER — Ambulatory Visit: Payer: Medicare HMO | Admitting: Neurology

## 2022-11-05 ENCOUNTER — Other Ambulatory Visit (HOSPITAL_COMMUNITY): Payer: No Typology Code available for payment source

## 2022-11-06 ENCOUNTER — Ambulatory Visit: Payer: No Typology Code available for payment source

## 2022-11-06 VITALS — Wt 127.0 lb

## 2022-11-06 DIAGNOSIS — Z0000 Encounter for general adult medical examination without abnormal findings: Secondary | ICD-10-CM

## 2022-11-06 NOTE — Progress Notes (Signed)
Subjective:   Denise Rice is a 72 y.o. female who presents for Medicare Annual (Subsequent) preventive examination.  Visit Complete: Virtual I connected with  Ceasar Mons on 11/06/22 by a audio enabled telemedicine application and verified that I am speaking with the correct person using two identifiers.  Patient Location: Home  Provider Location: Office/Clinic  I discussed the limitations of evaluation and management by telemedicine. The patient expressed understanding and agreed to proceed.  Vital Signs: Because this visit was a virtual/telehealth visit, some criteria may be missing or patient reported. Any vitals not documented were not able to be obtained and vitals that have been documented are patient reported.  Patient Medicare AWV questionnaire was completed by the patient on 11/02/22; I have confirmed that all information answered by patient is correct and no changes since this date.  Cardiac Risk Factors include: advanced age (>60men, >19 women)     Objective:    Today's Vitals   11/06/22 0744  Weight: 127 lb (57.6 kg)   Body mass index is 24.8 kg/m.     11/06/2022    7:53 AM 05/26/2022    6:22 PM 12/19/2021    3:46 PM 10/29/2021    7:56 AM 08/15/2021    2:51 AM 08/14/2021   11:19 PM 04/22/2021    1:12 PM  Advanced Directives  Does Patient Have a Medical Advance Directive? Yes Yes No Yes Yes No Yes  Type of Estate agent of [place];Living will Living will;Healthcare Power of [employer] Power of [place];Living will Healthcare Power of [place];Living will;Out of facility DNR (pink MOST or yellow form)  Living will;Healthcare Power of Attorney  Does patient want to make changes to medical advance directive?     Yes (ED - send information to MyChart)  No - Patient declined  Copy of Healthcare Power of Attorney in Chart? No - copy requested   No - copy requested No - copy requested  No - copy requested  Would patient like information on  creating a medical advance directive?   No - Patient declined    No - Patient declined    Current Medications (verified) Outpatient Encounter Medications as of 11/06/2022  Medication Sig   acetaminophen (TYLENOL) 325 MG tablet Take 325 mg by mouth daily as needed for headache.   Biotin 10 MG CAPS Take by mouth.   busPIRone (BUSPAR) 7.5 MG tablet Take 1 tablet (7.5 mg total) by mouth 2 (two) times daily as needed (anxiety).   Cholecalciferol (VITAMIN D) 50 MCG (2000 UT) CAPS Take 2,000 Units by mouth daily.   Cod Liver Oil CAPS Take 1 capsule by mouth daily.   cyanocobalamin (VITAMIN B12) 1000 MCG/ML injection Inject 1 mL (1,000 mcg total) into the muscle every 30 (thirty) days.   diazepam (VALIUM) 10 MG tablet Take 1 tablet (10 mg total) by mouth daily as needed for anxiety   dicyclomine (BENTYL) 20 MG tablet Take 1 tablet (20 mg total) by mouth 2 (two) times daily as needed for spasms (abdominal pain).   ferrous sulfate 325 (65 FE) MG tablet Take 325 mg by mouth daily with breakfast.   mupirocin ointment (BACTROBAN) 2 % Apply to affected areas topically 2 (two) times daily.   ondansetron (ZOFRAN) 4 MG tablet Take 1 tablet (4 mg total) by mouth every 8 (eight) hours as needed for nausea or vomiting.   Potassium (POTASSIMIN PO) Take 1 tablet by mouth daily.   Syringe/Needle, Disp, (SYRINGE 3CC/25GX1") 25G X  1" 3 ML MISC Use once monthly for injection of Vitamin B-12   traZODone (DESYREL) 100 MG tablet Take 1-1.5 tablets (100-150 mg total) by mouth at bedtime as needed for sleep.   [DISCONTINUED] losartan (COZAAR) 25 MG tablet Take 1 tablet (25 mg total) by mouth in the morning and at bedtime.   No facility-administered encounter medications on file as of 11/06/2022.    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    craniotomy for repair of R ear   Vertigo    Vitamin B12 deficiency 07/29/2018   Started IM injections monthly 06/2018   Vitamin D deficiency    Past Surgical History:  Procedure Laterality Date   BLEPHAROPLASTY  03-05-2000   CRANIOTOMY  08/2014   to repair dehescence in right ear   HAMMER TOE SURGERY Left 08-29-2013   2nd toe   IMPLANTATION BONE ANCHORED HEARING AID Left 07/27/2008   left temporal bone;now removed   LAPAROSCOPIC CHOLECYSTECTOMY  03-31-2005   LAPAROSCOPIC GASTRIC BANDING  02-08-2007   LAPAROSCOPY N/A 11/17/2017   Procedure: LAPAROSCOPY LYSIS OF ADHESIONS FOR SMALL BOWEL OBSTRUCTION;  Surgeon: Glenna Fellows, MD;  Location: [facility] OR;  Service: General;  Laterality: N/A;   LASER ABLATION CONDOLAMATA N/A 07/30/2012   Procedure: LASER ABLATION CONDOLAMATA;  Surgeon: Romie Levee, MD;  Location: [facility];  Service: General;  Laterality: N/A;   LASIK Bilateral 2002   LYSIS OF ADHESION N/A 11/17/2017   Procedure: LYSIS OF ADHESION;  Surgeon: Glenna Fellows, MD;  Location: [facility] OR;  Service: General;  Laterality: N/A;   ROUX-EN-Y GASTRIC BYPASS  AUG 2013   STAPEDES SURGERY Left 1985;  1994 x2;  02-03-1998   STRABISMUS SURGERY Right    VAGINAL HYSTERECTOMY  2007   fibroids   Family History  Problem Relation Age of Onset   Heart attack Mother    Heart disease Mother    Diabetes Mother    Hypertension Mother    Hyperlipidemia Mother    Stroke Mother    Kidney disease Mother    Thyroid disease Mother    Anxiety disorder Mother     Obesity Mother    Cancer - Prostate Father    Diabetes Father    Hypertension Father    Hyperlipidemia Father    Heart disease Father    Stroke Father    Kidney disease Father    Obesity Father    Stroke Sister    Diabetes Sister    High blood pressure Sister    Diabetes Maternal Aunt    Heart disease Maternal Aunt    Cancer Paternal Aunt    Cancer Paternal Uncle    Colon cancer Neg Hx    Colon polyps Neg Hx    Breast cancer Neg Hx    Social History  Socioeconomic History   Marital status: Married    Spouse name: Mariana Kaufman    Number of children: 1   Years of education: MBA   Highest education level: Master's degree (e.g., MA, MS, MEng, MEd, MSW, MBA)  Occupational History   Occupation: Customer service manager - retired    Associate Professor: [employer]    Comment: [employer]   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   Left handed.   Caffeine use: 2-3 cups (coffee) per day    Social Determinants of Health   Financial Resource Strain: Medium Risk (11/02/2022)   Overall Financial Resource Strain (CARDIA)    Difficulty of Paying Living Expenses: Somewhat hard  Food Insecurity: No Food Insecurity (11/02/2022)   Hunger Vital Sign    Worried About Running Out of Food in the Last Year: Never true    Ran Out of Food in the Last Year: Never true  Transportation Needs: No Transportation Needs (11/02/2022)   PRAPARE - Administrator, Civil Service (Medical): No    Lack of Transportation (Non-Medical): No  Physical Activity: Sufficiently Active (11/02/2022)   Exercise  Vital Sign    Days of Exercise per Week: 4 days    Minutes of Exercise per Session: 60 min  Stress: No Stress Concern Present (11/02/2022)   [employer] of Occupational Health - Occupational Stress Questionnaire    Feeling of Stress : Only a little  Recent Concern: Stress - Stress Concern Present (09/25/2022)   [employer] of Occupational Health - Occupational Stress Questionnaire    Feeling of Stress : To some extent  Social Connections: Moderately Integrated (11/02/2022)   Social Connection and Isolation Panel [NHANES]    Frequency of Communication with Friends and Family: Three times a week    Frequency of Social Gatherings with Friends and Family: Patient declined    Attends Religious Services: 1 to 4 times per year    Active Member of [employer] or Organizations: No    Attends Banker Meetings: Never    Marital Status: Married  Recent Concern: Social Connections - Moderately Isolated (11/02/2022)   Social Connection and Isolation Panel [NHANES]    Frequency of Communication with Friends and Family: Three times a week    Frequency of Social Gatherings with Friends and Family: Patient declined    Attends Religious Services: Never    Database administrator or Organizations: No    Attends Engineer, structural: Never    Marital Status: Married    Tobacco Counseling Counseling given: Not Answered   Clinical Intake:  Pre-visit preparation completed: Yes  Pain : No/denies pain     BMI - recorded: 24.8 Nutritional Status: BMI of 19-24  Normal Diabetes: No  How often do you need to have someone help you when you read instructions, pamphlets, or other written materials from your doctor or pharmacy?: 1 - Never  Interpreter Needed?: No  Information entered by :: Lanier Ensign, LPN   Activities of Daily Living    11/02/2022  11:22 AM  In your present state of health, do you have any difficulty performing the following activities:  Hearing? 1   Vision? 0  Difficulty concentrating or making decisions? 0  Walking or climbing stairs? 0  Dressing or bathing? 0  Doing errands, shopping? 0  Preparing Food and eating ? N  Using the Toilet? N  In the past six months, have you accidently leaked urine? Y  Do you have problems with loss of bowel control? Y  Managing your Medications? N  Managing your Finances? N  Housekeeping or managing your Housekeeping? N    Patient Care Team: Willow Ora, MD as PCP - General (Family Medicine) Jerene Bears, MD as Consulting Physician (Gynecology) Anson Fret, MD as Consulting Physician (Neurology) Prince Rome, Casimiro Needle, MD as Consulting Physician (Sports Medicine)  Indicate any recent Medical Services you may have received from other than [facility] providers in the past year (date may be approximate).     Assessment:   This is a routine wellness examination for [place].  Hearing/Vision screen Hearing Screening - Comments:: Denies any hearing issues  Vision Screening - Comments:: Pt follows up with Dr Dione Booze for annual eye exams    Goals Addressed             This Visit's Progress    Patient Stated       Stay healthy and active        Depression Screen    11/06/2022    7:49 AM 09/29/2022    3:40 PM 09/29/2022    3:31 PM 03/10/2022    2:21 PM 02/18/2022   11:40 AM 10/29/2021    7:54 AM 10/15/2021   10:03 AM  PHQ 2/9 Scores  PHQ - 2 Score 0 2 0 1 2 0 0  PHQ- 9 Score 0 4  2 4       Fall Risk    11/02/2022   11:22 AM 09/29/2022    3:40 PM 09/29/2022    3:31 PM 03/10/2022    2:20 PM 02/18/2022   11:40 AM  Fall Risk   Falls in the past year? 1 1 0 0 1  Number falls in past yr: 0 1 0 0 1  Injury with Fall? 0 0 0 0 1  Risk for fall due to : History of fall(s) No Fall Risks No Fall Risks No Fall Risks History of fall(s)  Follow up Falls prevention discussed Falls evaluation completed Falls evaluation completed Falls evaluation completed Falls evaluation completed    MEDICARE RISK AT  HOME: Medicare Risk at Home Any stairs in or around the home?: Yes If so, are there any without handrails?: Yes Home free of loose throw rugs in walkways, pet beds, electrical cords, etc?: Yes Adequate lighting in your home to reduce risk of falls?: Yes Life alert?: No Use of a cane, walker or w/c?: No Grab bars in the bathroom?: Yes Shower chair or bench in shower?: No Elevated toilet seat or a handicapped toilet?: No  TIMED UP AND GO:  Was the test performed?  No    Cognitive Function:        11/06/2022    7:54 AM 10/29/2021    7:59 AM 10/25/2020    1:39 PM  6CIT Screen  What Year? 0 points 0 points 0 points  What month? 0 points 0 points 0 points  What time? 0 points 0 points 0 points  Count back from 20 0 points 0 points 0 points  Months in  reverse 0 points 0 points 0 points  Repeat phrase 0 points 0 points 6 points  Total Score 0 points 0 points 6 points    Immunizations Immunization History  Administered Date(s) Administered   Fluad Quad(high Dose 65+) 10/01/2018, 09/23/2019, 10/25/2020, 10/15/2021   Fluad Trivalent(High Dose 65+) 09/29/2022   Fluzone Influenza virus vaccine,trivalent (IIV3), split virus 10/08/2011   Hepatitis A 08/01/2015, 02/06/2016   Hepatitis B 08/01/2015, 10/03/2015, 02/06/2016   Influenza-Unspecified 09/29/2014, 09/26/2016, 10/01/2018   PFIZER(Purple Top)SARS-COV-2 Vaccination 01/24/2019, 02/11/2019, 10/27/2019   Pfizer Covid-19 Vaccine Bivalent Booster 9yrs & up 12/12/2020   Pneumococcal Conjugate-13 08/25/2014   Pneumococcal Polysaccharide-23 08/15/2013, 06/23/2018   Td 02/14/1995   Tdap 02/28/2009, 08/12/2016   Zoster Recombinant(Shingrix) 05/09/2021   Zoster, Live 05/19/2013    TDAP status: Up to date  Flu Vaccine status: Up to date  Pneumococcal vaccine status: Up to date  Covid-19 vaccine status: Information provided on how to obtain vaccines.   Qualifies for Shingles Vaccine? Yes   Zostavax completed No   Shingrix  Completed?: No.    Education has been provided regarding the importance of this vaccine. Patient has been advised to call insurance company to determine out of pocket expense if they have not yet received this vaccine. Advised may also receive vaccine at local pharmacy or Health Dept. Verbalized acceptance and understanding.  Screening Tests Health Maintenance  Topic Date Due   Zoster Vaccines- Shingrix (2 of 2) 07/04/2021   COVID-19 Vaccine (5 - 2023-24 season) 09/14/2022   DEXA SCAN  04/13/2023   MAMMOGRAM  05/16/2023   Medicare Annual Wellness (AWV)  11/06/2023   DTaP/Tdap/Td (4 - Td or Tdap) 08/13/2026   Colonoscopy  02/13/2032   Pneumonia Vaccine 39+ Years old  Completed   INFLUENZA VACCINE  Completed   Hepatitis C Screening  Completed   HPV VACCINES  Aged Out    Health Maintenance  Health Maintenance Due  Topic Date Due   Zoster Vaccines- Shingrix (2 of 2) 07/04/2021   COVID-19 Vaccine (5 - 2023-24 season) 09/14/2022    Colorectal cancer screening: Type of screening: Colonoscopy. Completed 02/12/22. Repeat every 10 years  Mammogram status: Completed 05/16/22. Repeat every year  Bone Density status: Completed 04/12/21. Results reflect: Bone density results: OSTEOPOROSIS. Repeat every 2 years.  Additional Screening:  Hepatitis C Screening: Completed 09/07/15  Vision Screening: Recommended annual ophthalmology exams for early detection of glaucoma and other disorders of the eye. Is the patient up to date with their annual eye exam?  Yes  Who is the provider or what is the name of the office in which the patient attends annual eye exams? Dr Dione Booze If pt is not established with a provider, would they like to be referred to a provider to establish care? No .   Dental Screening: Recommended annual dental exams for proper oral hygiene    Community Resource Referral / Chronic Care Management: CRR required this visit?  No   CCM required this visit?  No     Plan:     I  have personally reviewed and noted the following in the patient's chart:   Medical and social history Use of alcohol, tobacco or illicit drugs  Current medications and supplements including opioid prescriptions. Patient is not currently taking opioid prescriptions. Functional ability and status Nutritional status Physical activity Advanced directives List of other physicians Hospitalizations, surgeries, and ER visits in previous 12 months Vitals Screenings to include cognitive, depression, and falls Referrals and appointments  In addition, I  have reviewed and discussed with patient certain preventive protocols, quality metrics, and best practice recommendations. A written personalized care plan for preventive services as well as general preventive health recommendations were provided to patient.     Marzella Schlein, LPN   19/14/7829   After Visit Summary: (MyChart) Due to this being a telephonic visit, the after visit summary with patients personalized plan was offered to patient via MyChart   Nurse Notes: none

## 2022-11-06 NOTE — Patient Instructions (Signed)
Denise Rice , Thank you for taking time to come for your Medicare Wellness Visit. I appreciate your ongoing commitment to your health goals. Please review the following plan we discussed and let me know if I can assist you in the future.   Referrals/Orders/Follow-Ups/Clinician Recommendations: continue  30 minutes of exercise or brisk walking, 6-8 glasses of water, and 5 servings of fruits and vegetables each day.      This is a list of the screening recommended for you and due dates:  Health Maintenance  Topic Date Due   Zoster (Shingles) Vaccine (2 of 2) 07/04/2021   COVID-19 Vaccine (5 - 2023-24 season) 09/14/2022   DEXA scan (bone density measurement)  04/13/2023   Mammogram  05/16/2023   Medicare Annual Wellness Visit  11/06/2023   DTaP/Tdap/Td vaccine (3 - Td or Tdap) 08/13/2026   Colon Cancer Screening  02/13/2032   Pneumonia Vaccine  Completed   Flu Shot  Completed   Hepatitis C Screening  Completed   HPV Vaccine  Aged Out    Advanced directives: (Copy Requested) Please bring a copy of your health care power of attorney and living will to the office to be added to your chart at your convenience.  Next Medicare Annual Wellness Visit scheduled for next year: Yes

## 2022-11-24 ENCOUNTER — Other Ambulatory Visit: Payer: Self-pay

## 2022-11-28 ENCOUNTER — Other Ambulatory Visit (HOSPITAL_COMMUNITY): Payer: Self-pay

## 2022-12-01 ENCOUNTER — Other Ambulatory Visit (HOSPITAL_COMMUNITY): Payer: Self-pay | Attending: Oncology

## 2022-12-02 ENCOUNTER — Other Ambulatory Visit (HOSPITAL_COMMUNITY): Payer: Self-pay

## 2022-12-22 ENCOUNTER — Other Ambulatory Visit: Payer: Self-pay

## 2022-12-22 ENCOUNTER — Other Ambulatory Visit (HOSPITAL_COMMUNITY): Payer: Self-pay

## 2022-12-27 DIAGNOSIS — R300 Dysuria: Secondary | ICD-10-CM | POA: Diagnosis not present

## 2022-12-27 DIAGNOSIS — N390 Urinary tract infection, site not specified: Secondary | ICD-10-CM | POA: Diagnosis not present

## 2023-01-19 ENCOUNTER — Other Ambulatory Visit: Payer: Self-pay

## 2023-01-19 ENCOUNTER — Other Ambulatory Visit (HOSPITAL_COMMUNITY): Payer: Self-pay

## 2023-01-20 ENCOUNTER — Other Ambulatory Visit: Payer: Self-pay

## 2023-01-21 ENCOUNTER — Other Ambulatory Visit: Payer: Self-pay

## 2023-01-22 ENCOUNTER — Other Ambulatory Visit (HOSPITAL_COMMUNITY): Payer: Self-pay

## 2023-02-06 ENCOUNTER — Other Ambulatory Visit (HOSPITAL_COMMUNITY): Payer: Self-pay

## 2023-02-06 DIAGNOSIS — M25522 Pain in left elbow: Secondary | ICD-10-CM | POA: Diagnosis not present

## 2023-02-06 DIAGNOSIS — M25562 Pain in left knee: Secondary | ICD-10-CM | POA: Diagnosis not present

## 2023-02-06 MED ORDER — PREDNISONE 10 MG PO TABS
ORAL_TABLET | ORAL | 0 refills | Status: DC
  Filled 2023-02-06: qty 21, 6d supply, fill #0

## 2023-02-16 ENCOUNTER — Other Ambulatory Visit: Payer: Self-pay

## 2023-02-16 ENCOUNTER — Other Ambulatory Visit: Payer: Self-pay | Admitting: Family Medicine

## 2023-02-16 ENCOUNTER — Other Ambulatory Visit (HOSPITAL_COMMUNITY): Payer: Self-pay

## 2023-02-16 MED ORDER — TRAZODONE HCL 100 MG PO TABS
100.0000 mg | ORAL_TABLET | Freq: Every evening | ORAL | 3 refills | Status: DC | PRN
  Filled 2023-02-16: qty 45, 30d supply, fill #0
  Filled 2023-03-14: qty 45, 30d supply, fill #1
  Filled 2023-04-01: qty 45, 30d supply, fill #2
  Filled 2023-04-28: qty 45, 30d supply, fill #3

## 2023-02-17 ENCOUNTER — Other Ambulatory Visit (HOSPITAL_COMMUNITY): Payer: Self-pay

## 2023-02-17 ENCOUNTER — Other Ambulatory Visit: Payer: Self-pay

## 2023-02-18 ENCOUNTER — Other Ambulatory Visit (HOSPITAL_COMMUNITY): Payer: Self-pay

## 2023-02-20 ENCOUNTER — Ambulatory Visit (INDEPENDENT_AMBULATORY_CARE_PROVIDER_SITE_OTHER): Payer: No Typology Code available for payment source | Admitting: Family Medicine

## 2023-02-20 ENCOUNTER — Encounter: Payer: Self-pay | Admitting: Family Medicine

## 2023-02-20 ENCOUNTER — Other Ambulatory Visit (HOSPITAL_COMMUNITY): Payer: Self-pay

## 2023-02-20 VITALS — BP 140/80 | HR 79 | Resp 16 | Ht 60.0 in | Wt 127.2 lb

## 2023-02-20 DIAGNOSIS — I10 Essential (primary) hypertension: Secondary | ICD-10-CM | POA: Diagnosis not present

## 2023-02-20 DIAGNOSIS — R519 Headache, unspecified: Secondary | ICD-10-CM | POA: Diagnosis not present

## 2023-02-20 DIAGNOSIS — R0789 Other chest pain: Secondary | ICD-10-CM | POA: Diagnosis not present

## 2023-02-20 LAB — BASIC METABOLIC PANEL
BUN: 18 mg/dL (ref 6–23)
CO2: 29 meq/L (ref 19–32)
Calcium: 8.3 mg/dL — ABNORMAL LOW (ref 8.4–10.5)
Chloride: 105 meq/L (ref 96–112)
Creatinine, Ser: 0.81 mg/dL (ref 0.40–1.20)
GFR: 73.63 mL/min (ref >60.00)
Glucose, Bld: 80 mg/dL (ref 70–99)
Potassium: 3.8 meq/L (ref 3.5–5.1)
Sodium: 143 meq/L (ref 135–145)

## 2023-02-20 LAB — CBC
HCT: 36.2 % (ref 36.0–46.0)
Hemoglobin: 12.3 g/dL (ref 12.0–15.0)
MCHC: 34.1 g/dL (ref 30.0–36.0)
MCV: 82.8 fL (ref 78.0–100.0)
Platelets: 182 10*3/uL (ref 150.0–400.0)
RBC: 4.36 Mil/uL (ref 3.87–5.11)
RDW: 13.8 % (ref 11.5–15.5)
WBC: 5.6 10*3/uL (ref 4.0–10.5)

## 2023-02-20 LAB — C-REACTIVE PROTEIN: CRP: <1 mg/dL (ref 0.5–20.0)

## 2023-02-20 LAB — MAGNESIUM: Magnesium: 1.5 mg/dL (ref 1.5–2.5)

## 2023-02-20 MED ORDER — AMLODIPINE BESYLATE 5 MG PO TABS
2.5000 mg | ORAL_TABLET | Freq: Every day | ORAL | 0 refills | Status: DC
  Filled 2023-02-20: qty 30, 60d supply, fill #0

## 2023-02-20 NOTE — Assessment & Plan Note (Signed)
 Home BP's 150-180's/80's-100 and here in the office when re-checked 140/80. We discussed pharmacologic treatment options, she agrees with trying amlodipine  5 mg 1/2 tablet daily at bedtime. Low-salt/DASH diet recommended. Continue monitoring BP regularly. Follow-up with PCP in 2 to 3 weeks, before if needed.

## 2023-02-20 NOTE — Progress Notes (Signed)
 ACUTE VISIT Chief Complaint  Patient presents with   Hypertension    Readings as high as 170/86 and 180/100   Headache   HPI: [name] is a 73 y.o. female with a PMHx significant for PCOS, IFG, bilateral sensorineural hearing loss, vitamin D  deficiency, vitamin B12 deficiency, and iron deficiency anemia, among some, who is here today complaining of headaches associated with high blood pressure.   BP has been elevated since an incident a couple months ago at work where she had dizziness, a buzzing sensation in her head, and pinpoint vision that lasted a few minutes. She was helped to a massage bed where she could lie down for a bit. The episode lasted about 30 seconds.   She has been keeping track of her blood pressure since then, and says it has been frequently elevated. The highest it has been has been 180/100.  She says she has no personal history of HTN, but does have family history of HTN, stroke, and CAD on both sides.  BP readings at home since 1/30:  170/86 172/88 170/85 161/98 158/94 152/62 102/62 156/91 180/100 183/93 158/87 143/86  Headaches:  Patient also complains of intermittent headaches for the past couple months. She thinks it is caused by elevated BP. She describes the headaches as a dull, throbbing sensation, usually parietal but sometimes move from frontal to parietal. She rates them as high as 9/10 at their worst.  She had migraines many years ago, but hasn't had them for a long time.  She endorses chronic tinnitus, as well as some nausea with the headaches.  Pertinent negatives include photophobia, focal neurologic deficit, or vomiting.   Lab Results  Component Value Date   NA 138 09/29/2022   CL 102 09/29/2022   K 3.7 09/29/2022   CO2 29 09/29/2022   BUN 16 09/29/2022   CREATININE 0.80 09/29/2022   GFR 74.94 09/29/2022   CALCIUM 8.2 (L) 09/29/2022   PHOS 4.4 06/16/2012   ALBUMIN 3.9 09/29/2022   GLUCOSE 56 (L) 09/29/2022   HypoMg++:  She is not on Mg supplementation.  Chest pain:  She also had one instance of chest pain 2 weeks ago while watching TV.  She says it was a bilateral chest pain that radiated up into her throat, and lasted for 10-15 minutes.  Endorses some diaphoresis during the episode.  Denies any SOB or palpitations.  Outside of this episode, she denies any chest pain, SOB, diaphoresis, or dizziness while exercising.   She also mentions of occasional crystals like spots in her visual field and green flashes in the sides of her eyes when it is dark.  She missed an appointment with her eye care provider last week.   Review of Systems  Constitutional:  Negative for appetite change, chills and fever.  HENT:  Negative for sore throat.   Eyes:  Negative for pain, discharge and redness.  Respiratory:  Negative for cough and wheezing.   Gastrointestinal:  Negative for abdominal pain and vomiting.  Endocrine: Negative for cold intolerance and heat intolerance.  Genitourinary:  Negative for decreased urine volume, dysuria and hematuria.  Skin:  Negative for rash.  Neurological:  Negative for syncope and facial asymmetry.  Psychiatric/Behavioral:  Negative for confusion and hallucinations.   See other pertinent positives and negatives in HPI.  Current Outpatient Medications on File Prior to Visit  Medication Sig Dispense Refill   acetaminophen  (TYLENOL ) 325 MG tablet Take 325 mg by mouth daily as needed for headache.  Biotin 10 MG CAPS Take by mouth.     busPIRone  (BUSPAR ) 7.5 MG tablet Take 1 tablet (7.5 mg total) by mouth 2 (two) times daily as needed (anxiety). 60 tablet 5   Cholecalciferol (VITAMIN D ) 50 MCG (2000 UT) CAPS Take 2,000 Units by mouth daily.     Cod Liver Oil CAPS Take 1 capsule by mouth daily.     cyanocobalamin  (VITAMIN B12) 1000 MCG/ML injection Inject 1 mL (1,000 mcg total) into the muscle every 30 (thirty) days. 10 mL 2   diazepam  (VALIUM ) 10 MG tablet Take 1 tablet (10 mg total) by  mouth daily as needed for anxiety 30 tablet 5   dicyclomine  (BENTYL ) 20 MG tablet Take 1 tablet (20 mg total) by mouth 2 (two) times daily as needed for spasms (abdominal pain). 20 tablet 0   ferrous sulfate 325 (65 FE) MG tablet Take 325 mg by mouth daily with breakfast.     mupirocin  ointment (BACTROBAN ) 2 % Apply to affected areas topically 2 (two) times daily. 22 g 0   ondansetron  (ZOFRAN ) 4 MG tablet Take 1 tablet (4 mg total) by mouth every 8 (eight) hours as needed for nausea or vomiting. 20 tablet 0   Potassium (POTASSIMIN PO) Take 1 tablet by mouth daily.     Syringe/Needle, Disp, (SYRINGE 3CC/25GX1) 25G X 1 3 ML MISC Use once monthly for injection of Vitamin B-12 50 each 0   traZODone  (DESYREL ) 100 MG tablet Take 1-1.5 tablets (100-150 mg total) by mouth at bedtime as needed for sleep. 45 tablet 3   No current facility-administered medications on file prior to visit.    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    craniotomy for repair of R ear   Vertigo    Vitamin B12 deficiency 07/29/2018   Started IM injections monthly 06/2018   Vitamin D  deficiency    Allergies  Allergen Reactions   Lactose Diarrhea   Topiramate  Nausea Only   Alprazolam  Nausea Only    Social History   Socioeconomic History   Marital status: Married    Spouse name: Ozell Stallion    Number of children: 1   Years of education: MBA   Highest education level: Master's  degree (e.g., MA, MS, MEng, MEd, MSW, MBA)  Occupational History   Occupation: Customer Service Manager - retired    Associate Professor: [employer]    Comment: [employer]   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   Left handed.   Caffeine use: 2-3 cups (coffee) per day    Social Drivers of Health   Financial Resource Strain: Low Risk  (02/20/2023)   Overall Financial Resource Strain (CARDIA)    Difficulty of Paying Living Expenses: Not hard at all  Food Insecurity: No Food Insecurity (02/20/2023)   Hunger Vital Sign    Worried About Running Out of Food in the Last Year: Never true    Ran Out of Food in the Last Year: Never true  Transportation Needs: No Transportation Needs (02/20/2023)   PRAPARE - Administrator, Civil Service (Medical): No    Lack of Transportation (Non-Medical): No  Physical Activity: Sufficiently Active (02/20/2023)   Exercise Vital Sign    Days of Exercise per Week: 3 days    Minutes of Exercise per Session: 60 min  Stress: No Stress Concern Present (02/20/2023)   [employer] of Occupational Health - Occupational Stress Questionnaire    Feeling of Stress : Only a little  Social Connections: Moderately Isolated (02/20/2023)   Social Connection and Isolation Panel [NHANES]    Frequency of Communication with Friends and Family: More than three times a week    Frequency of Social Gatherings with Friends and Family: Twice a week    Attends Religious Services: Never    Database Administrator or Organizations: No    Attends Banker Meetings: Never    Marital  Status: Married    Vitals:   02/20/23 1402 02/20/23 1427  BP: 130/74 (!) 140/80  Pulse: 79   Resp: 16   SpO2: 96%    Body mass index is 24.85 kg/m.  Physical Exam Vitals and nursing note reviewed.  Constitutional:      General: She is not in acute distress.    Appearance: She is well-developed.  HENT:     Head: Normocephalic and atraumatic.     Mouth/Throat:     Mouth: Mucous membranes are moist.     Pharynx: Oropharynx is clear.  Eyes:     Conjunctiva/sclera: Conjunctivae normal.     Funduscopic exam:    Right eye: No hemorrhage or papilledema.        Left eye: No hemorrhage or papilledema.  Cardiovascular:     Rate and Rhythm: Normal rate and regular rhythm.     Pulses:          Dorsalis pedis pulses are 2+ on the right side and 2+ on the left side.     Heart sounds: No murmur heard. Pulmonary:     Effort: Pulmonary effort is normal. No respiratory distress.     Breath sounds: Normal breath sounds.  Abdominal:     Palpations: Abdomen is soft. There is no mass.     Tenderness: There is no abdominal tenderness.  Musculoskeletal:     Right lower leg: No edema.     Left lower leg: No edema.  Lymphadenopathy:     Cervical: No cervical adenopathy.  Skin:    General: Skin is warm.     Findings: No erythema or rash.  Neurological:     General: No focal deficit present.     Mental Status: She is alert and oriented to person, place, and time.     Cranial Nerves: No cranial nerve deficit.     Motor: No pronator drift.     Gait: Gait normal.     Deep Tendon Reflexes:  Reflex Scores:      Patellar reflexes are 2+ on the right side and 2+ on the left side. Psychiatric:        Mood and Affect: Mood and affect normal.    ASSESSMENT AND PLAN:  Ms. Arthurs was seen today for headaches associated with elevated blood pressure.  Lab Results  Component Value Date   NA 143 02/20/2023   CL 105 02/20/2023   K 3.8 02/20/2023   CO2 29 02/20/2023   BUN 18 02/20/2023    CREATININE 0.81 02/20/2023   GFR 73.63 02/20/2023   CALCIUM 8.3 (L) 02/20/2023   PHOS 4.4 06/16/2012   ALBUMIN 3.9 09/29/2022   GLUCOSE 80 02/20/2023   Lab Results  Component Value Date   CRP <1.0 02/20/2023   Lab Results  Component Value Date   WBC 5.6 02/20/2023   HGB 12.3 02/20/2023   HCT 36.2 02/20/2023   MCV 82.8 02/20/2023   PLT 182.0 02/20/2023   Headache, unspecified headache type We discussed possible etiologies, certainly elevated BP could be a contributing factor. ?  Tension headaches. I do not think imaging is needed at this time. Please schedule eye examination to evaluate for ophthalmic causes of headache. For now recommend taking acetaminophen  (Tylenol ) 500 mg twice daily as needed. Instructed about warning signs. F/U with PCP in 2-3 weeks.  -     C-reactive protein; Future  Essential (primary) hypertension Assessment & Plan: Home BP's 150-180's/80's-100 and here in the office when re-checked 140/80. We discussed pharmacologic treatment options, she agrees with trying amlodipine  5 mg 1/2 tablet daily at bedtime. Low-salt/DASH diet recommended. Continue monitoring BP regularly. Follow-up with PCP in 2 to 3 weeks, before if needed.  Orders: -     EKG 12-Lead -     amLODIPine  Besylate; Take 0.5 tablets (2.5 mg total) by mouth daily.  Dispense: 30 tablet; Refill: 0 -     CBC; Future -     Basic metabolic panel; Future  Hypomagnesemia Assessment & Plan: She is not on magnesium  supplementation. Further recommendation will be given according to magnesium  result.  Orders: -     Magnesium ; Future  Other chest pain  One times episode 2 weeks ago at rest. States that she exercises regularly and has not had any CP while doing so. EKG today NSR, LAD, low voltage precordial leads,no ST/T wave abnormalities. Compared with EKG 12/2021 no significant changes , sinus arrhythmia not longer present.  Return in about 3 weeks (around 03/13/2023) for HTN with  PCP.  I, Leonce PARAS Wierda, acting as a scribe for Commodore Bellew, MD., have documented all relevant documentation on the behalf of Dekota Shenk, MD, as directed by  Tawania Daponte, MD while in the presence of Leanndra Pember, MD.   I, Veronika Heard, MD, have reviewed all documentation for this visit. The documentation on 02/20/23 for the exam, diagnosis, procedures, and orders are all accurate and complete.  Saket Hellstrom G. Fuad Forget, MD  [facility] [place] office.

## 2023-02-20 NOTE — Patient Instructions (Addendum)
 A few things to remember from today's visit:  Essential (primary) hypertension - Plan: EKG 12-Lead, Basic metabolic panel, CBC, amLODipine  (NORVASC ) 5 MG tablet  Headache, unspecified headache type - Plan: C-reactive protein  Hypomagnesemia - Plan: Magnesium  Started Amlodipine  5 mg 1/2 tablet daily at bedtime. Low-salt diet. Continue monitoring blood pressure regularly. For headache just acetaminophen  500 mg twice daily if needed.  If you need refills for medications you take chronically, please call your pharmacy. Do not use My Chart to request refills or for acute issues that need immediate attention. If you send a my chart message, it may take a few days to be addressed, specially if I am not in the office.  Please be sure medication list is accurate. If a new problem present, please set up appointment sooner than planned today.

## 2023-02-20 NOTE — Assessment & Plan Note (Signed)
She is not on magnesium supplementation. Further recommendation will be given according to magnesium result.

## 2023-02-25 ENCOUNTER — Encounter: Payer: Self-pay | Admitting: Family Medicine

## 2023-03-04 ENCOUNTER — Ambulatory Visit: Payer: No Typology Code available for payment source | Admitting: Family Medicine

## 2023-03-11 ENCOUNTER — Ambulatory Visit (INDEPENDENT_AMBULATORY_CARE_PROVIDER_SITE_OTHER): Payer: No Typology Code available for payment source | Admitting: Family Medicine

## 2023-03-11 ENCOUNTER — Encounter: Payer: Self-pay | Admitting: Family Medicine

## 2023-03-11 ENCOUNTER — Other Ambulatory Visit: Payer: Self-pay

## 2023-03-11 ENCOUNTER — Other Ambulatory Visit (HOSPITAL_COMMUNITY): Payer: Self-pay

## 2023-03-11 VITALS — BP 138/78 | HR 68 | Temp 97.7°F | Ht 60.0 in | Wt 126.0 lb

## 2023-03-11 DIAGNOSIS — L239 Allergic contact dermatitis, unspecified cause: Secondary | ICD-10-CM | POA: Diagnosis not present

## 2023-03-11 DIAGNOSIS — I10 Essential (primary) hypertension: Secondary | ICD-10-CM | POA: Diagnosis not present

## 2023-03-11 MED ORDER — TRIAMCINOLONE ACETONIDE 0.1 % EX CREA
1.0000 | TOPICAL_CREAM | Freq: Two times a day (BID) | CUTANEOUS | 0 refills | Status: DC
  Filled 2023-03-11: qty 45, 23d supply, fill #0

## 2023-03-11 MED ORDER — AMLODIPINE BESYLATE 5 MG PO TABS: 5.0000 mg | ORAL_TABLET | Freq: Every day | ORAL | Status: DC

## 2023-03-11 NOTE — Patient Instructions (Signed)
 Please return for your annual complete physical; please come fasting.   If you have any questions or concerns, please don't hesitate to send me a message via MyChart or call the office at [phone]. Thank you for visiting with [place] today! It's our pleasure caring for you.   VISIT SUMMARY:  Denise Rice, during your visit today, we discussed your recent issues with high blood pressure and headaches, as well as a skin reaction to a lidocaine patch. We reviewed your current medications and lifestyle changes, and made some adjustments to help manage your conditions more effectively.  YOUR PLAN:  -HYPERTENSION: Hypertension means high blood pressure, which can lead to serious health problems if not managed. We have increased your Amlodipine dose to 5mg  daily to help better control your blood pressure. Please continue to monitor your blood pressure at home and record your readings. If your blood pressure remains high after 2-3 weeks, we may consider adding another medication.  -CONTACT DERMATITIS: Contact dermatitis is a skin reaction that occurs after contact with an irritant or allergen. You developed this reaction to a lidocaine patch. We have prescribed Triamcinolone cream to apply once daily to help with the itching and soreness. You can also use topical Benadryl if the itching persists.  -GENERAL HEALTH MAINTENANCE: We recommend scheduling your annual physical examination at the next available appointment. This will help [place] keep track of your overall health and address any new concerns. We will also check in with you in about 3 weeks to see how your blood pressure is responding to the increased Amlodipine dose.  INSTRUCTIONS:  Please schedule your annual physical examination at the next available appointment. We will follow up with you in about 3 weeks to assess your blood pressure control on the increased Amlodipine dose.

## 2023-03-11 NOTE — Progress Notes (Signed)
 Subjective  CC:  Chief Complaint  Patient presents with   Follow-up   Hypertension    Pt stated that her BP has been running high 158/90 and 172/100   Headache    Pt stated that her headache has subsided     HPI: Denise Rice is a 73 y.o. female who presents to the office today to address the problems listed above in the chief complaint. Hypertension f/u:new dx: reviewed recent notes. Started on amlodipine 2.5mg . stable labs, nl EKG. Since starting medication, patient feels much better.  No longer having headaches.  Home blood pressures have come down but remain elevated, 140s to 150s over 80-90.  Blood pressure is better in the office today.  No chest pain or shortness of breath.  She is tolerating new medication well. Has an itchy red area on the middle of her lower back.  She had used a lidoderm patch and had a reaction.  Rash is moist and itching.  No systemic symptoms.    Assessment  1. Essential (primary) hypertension   2. Allergic dermatitis      Plan   Hypertension f/u: BP control is improved.  No symptoms of secondary causes.  Will increase amlodipine to 5 mg daily.  She eats a low-sodium diet.  She exercises regularly.  Her weight is ideal.  She will continue home blood pressure monitoring once or twice daily and send me a note 2 to 3 weeks.  That time we would consider adding a thiazide diuretic if needed.  Patient understands and agrees Allergic dermatitis: Start triamcinolone steroid cream twice daily. Due for complete physical Education regarding management of these chronic disease states was given. Management strategies discussed on successive visits include dietary and exercise recommendations, goals of achieving and maintaining IBW, and lifestyle modifications aiming for adequate sleep and minimizing stressors.   Follow up: Complete physical  No orders of the defined types were placed in this encounter.  Meds ordered this encounter  Medications   amLODipine  (NORVASC) 5 MG tablet    Sig: Take 1 tablet (5 mg total) by mouth daily.   triamcinolone cream (KENALOG) 0.1 %    Sig: Apply 1 Application topically 2 (two) times daily. For 2 weeks, then as needed    Dispense:  45 g    Refill:  0      BP Readings from Last 3 Encounters:  03/11/23 138/78  02/20/23 (!) 140/80  09/29/22 132/78   Wt Readings from Last 3 Encounters:  03/11/23 126 lb (57.2 kg)  02/20/23 127 lb 4 oz (57.7 kg)  11/06/22 127 lb (57.6 kg)    Lab Results  Component Value Date   CHOL 137 01/24/2022   CHOL 163 08/03/2018   CHOL 164 06/23/2018   Lab Results  Component Value Date   HDL 84.40 01/24/2022   HDL 75 08/03/2018   HDL 75.30 06/23/2018   Lab Results  Component Value Date   LDLCALC 42 01/24/2022   LDLCALC 67 08/03/2018   LDLCALC 69 06/23/2018   Lab Results  Component Value Date   TRIG 53.0 01/24/2022   TRIG 107 08/03/2018   TRIG 101.0 06/23/2018   Lab Results  Component Value Date   CHOLHDL 2 01/24/2022   CHOLHDL 2 06/23/2018   CHOLHDL 2.1 06/16/2012   No results found for: "LDLDIRECT" Lab Results  Component Value Date   CREATININE 0.81 02/20/2023   BUN 18 02/20/2023   NA 143 02/20/2023   K 3.8 02/20/2023  CL 105 02/20/2023   CO2 29 02/20/2023    The 10-year ASCVD risk score (Arnett DK, et al., 2019) is: 12%   Values used to calculate the score:     Age: 73 years     Sex: Female     Is Non-Hispanic African American: No     Diabetic: No     Tobacco smoker: No     Systolic Blood Pressure: 138 mmHg     Is BP treated: Yes     HDL Cholesterol: 84.4 mg/dL     Total Cholesterol: 137 mg/dL  I reviewed the patients updated PMH, FH, and SocHx.    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 She has never used smokeless tobacco. She reports that she does not drink alcohol and does not use drugs.  Current Meds  Medication Sig   acetaminophen (TYLENOL) 325 MG tablet Take 325 mg by mouth daily as needed for headache.   Biotin 10 MG CAPS Take by mouth.   busPIRone (BUSPAR) 7.5 MG tablet Take 1 tablet (7.5 mg total) by mouth 2 (two) times daily as needed (anxiety).   Cholecalciferol (VITAMIN D) 50 MCG (2000 UT) CAPS Take 2,000 Units by mouth daily.   Cod Liver Oil CAPS Take 1 capsule by mouth daily.   cyanocobalamin (VITAMIN B12) 1000 MCG/ML injection Inject 1 mL (1,000 mcg total) into the muscle every 30 (thirty) days.   diazepam (VALIUM) 10 MG tablet Take 1 tablet (10 mg total) by mouth daily as needed for anxiety   dicyclomine (BENTYL) 20 MG tablet Take 1 tablet (20 mg total) by mouth 2 (two) times daily as needed for spasms (abdominal pain).   ferrous sulfate 325 (65 FE) MG tablet Take 325 mg by mouth daily with breakfast.   mupirocin ointment (BACTROBAN) 2 % Apply to affected areas topically 2 (two)  times daily.   ondansetron (ZOFRAN) 4 MG tablet Take 1 tablet (4 mg total) by mouth every 8 (eight) hours as needed for nausea or vomiting.   Potassium (POTASSIMIN PO) Take 1 tablet by mouth daily.   Syringe/Needle, Disp, (SYRINGE 3CC/25GX1") 25G X 1" 3 ML MISC Use once monthly for injection of Vitamin B-12   traZODone (DESYREL) 100 MG tablet Take  1-1.5 tablets (100-150 mg total) by mouth at bedtime as needed for sleep.   triamcinolone cream (KENALOG) 0.1 % Apply 1 Application topically 2 (two) times daily. For 2 weeks, then as needed   [DISCONTINUED] amLODipine (NORVASC) 5 MG tablet Take 0.5 tablets (2.5 mg total) by mouth daily.    Review of Systems: Cardiovascular: negative for chest pain, palpitations, leg swelling, orthopnea Respiratory: negative for SOB, wheezing or persistent cough Gastrointestinal: negative for abdominal pain Genitourinary: negative for dysuria or gross hematuria  Objective  Vitals: BP 138/78   Pulse 68   Temp 97.7 F (36.5 C)   Ht 5' (1.524 m)   Wt 126 lb (57.2 kg)   LMP 01/13/2005   SpO2 100%   BMI 24.61 kg/m  General: no acute distress  Psych:  Alert and oriented, normal mood and affect HEENT:  Normocephalic, atraumatic, supple neck  Cardiovascular:  RRR without murmur. no edema Respiratory:  Good breath sounds bilaterally, CTAB with normal respiratory effort Skin:  Warm, central lower back with 2 x 5 elliptical erythematous rash Neurologic:   Mental status is normal Commons side effects, risks, benefits, and alternatives for medications and treatment plan prescribed today were discussed, and the patient expressed understanding of the given instructions. Patient is instructed to call or message via MyChart if he/she has any questions or concerns regarding our treatment plan. No barriers to understanding were identified. We discussed Red Flag symptoms and signs in detail. Patient expressed understanding regarding what to do in case of urgent or emergency  type symptoms.  Medication list was reconciled, printed and provided to the patient in AVS. Patient instructions and summary information was reviewed with the patient as documented in the AVS. This note was prepared with assistance of Dragon voice recognition software. Occasional wrong-word or sound-a-like substitutions may have occurred due to the inherent limitation

## 2023-03-13 ENCOUNTER — Other Ambulatory Visit: Payer: Self-pay | Admitting: Family Medicine

## 2023-03-13 ENCOUNTER — Telehealth: Payer: Self-pay | Admitting: Family Medicine

## 2023-03-13 ENCOUNTER — Telehealth: Payer: Self-pay | Admitting: *Deleted

## 2023-03-13 ENCOUNTER — Other Ambulatory Visit (HOSPITAL_COMMUNITY): Payer: Self-pay

## 2023-03-13 ENCOUNTER — Other Ambulatory Visit: Payer: Self-pay

## 2023-03-13 ENCOUNTER — Other Ambulatory Visit: Payer: No Typology Code available for payment source

## 2023-03-13 DIAGNOSIS — N390 Urinary tract infection, site not specified: Secondary | ICD-10-CM | POA: Diagnosis not present

## 2023-03-13 MED ORDER — BUSPIRONE HCL 7.5 MG PO TABS
7.5000 mg | ORAL_TABLET | Freq: Two times a day (BID) | ORAL | 5 refills | Status: DC | PRN
  Filled 2023-03-13: qty 60, 30d supply, fill #0
  Filled 2023-04-01: qty 60, 30d supply, fill #1
  Filled 2023-04-28: qty 60, 30d supply, fill #2
  Filled 2023-05-23: qty 60, 30d supply, fill #3
  Filled 2023-06-20: qty 60, 30d supply, fill #4
  Filled 2023-07-18: qty 60, 30d supply, fill #5

## 2023-03-13 NOTE — Telephone Encounter (Signed)
 Copied from CRM [id number]. Topic: General - Other >> Mar 13, 2023 11:52 AM Truddie Crumble wrote: Reason for CRM: patient called stating she was on the phone with Danford Bad and she had to hang up because she was at the pharmacy   See previews note  [facility]

## 2023-03-13 NOTE — Telephone Encounter (Signed)
 Spoke with patient stated no need of Urine test at this time

## 2023-03-13 NOTE — Telephone Encounter (Signed)
 Pt is requesting a urine test. Please advise

## 2023-03-16 ENCOUNTER — Other Ambulatory Visit (HOSPITAL_COMMUNITY): Payer: Self-pay

## 2023-03-16 ENCOUNTER — Other Ambulatory Visit: Payer: Self-pay

## 2023-03-16 ENCOUNTER — Encounter: Payer: Self-pay | Admitting: Family Medicine

## 2023-03-17 ENCOUNTER — Other Ambulatory Visit: Payer: Self-pay

## 2023-03-17 DIAGNOSIS — N390 Urinary tract infection, site not specified: Secondary | ICD-10-CM

## 2023-03-18 ENCOUNTER — Other Ambulatory Visit: Payer: Self-pay

## 2023-03-25 DIAGNOSIS — R350 Frequency of micturition: Secondary | ICD-10-CM | POA: Diagnosis not present

## 2023-03-30 ENCOUNTER — Other Ambulatory Visit (HOSPITAL_COMMUNITY): Payer: Self-pay

## 2023-03-30 MED ORDER — CIPROFLOXACIN HCL 250 MG PO TABS
250.0000 mg | ORAL_TABLET | Freq: Two times a day (BID) | ORAL | 0 refills | Status: DC
  Filled 2023-03-30 (×2): qty 14, 7d supply, fill #0

## 2023-03-31 ENCOUNTER — Other Ambulatory Visit (HOSPITAL_COMMUNITY): Payer: Self-pay

## 2023-03-31 ENCOUNTER — Encounter: Payer: Self-pay | Admitting: Family Medicine

## 2023-04-01 ENCOUNTER — Encounter: Payer: Self-pay | Admitting: Family Medicine

## 2023-04-01 ENCOUNTER — Ambulatory Visit (INDEPENDENT_AMBULATORY_CARE_PROVIDER_SITE_OTHER): Admitting: Family Medicine

## 2023-04-01 ENCOUNTER — Ambulatory Visit: Payer: Self-pay | Admitting: Family Medicine

## 2023-04-01 ENCOUNTER — Other Ambulatory Visit (HOSPITAL_COMMUNITY): Payer: Self-pay

## 2023-04-01 ENCOUNTER — Other Ambulatory Visit: Payer: Self-pay | Admitting: Family Medicine

## 2023-04-01 VITALS — BP 108/68 | HR 66 | Temp 98.1°F | Ht 60.0 in | Wt 121.8 lb

## 2023-04-01 DIAGNOSIS — R42 Dizziness and giddiness: Secondary | ICD-10-CM | POA: Diagnosis not present

## 2023-04-01 DIAGNOSIS — I10 Essential (primary) hypertension: Secondary | ICD-10-CM

## 2023-04-01 DIAGNOSIS — R61 Generalized hyperhidrosis: Secondary | ICD-10-CM

## 2023-04-01 DIAGNOSIS — M791 Myalgia, unspecified site: Secondary | ICD-10-CM | POA: Diagnosis not present

## 2023-04-01 DIAGNOSIS — E538 Deficiency of other specified B group vitamins: Secondary | ICD-10-CM

## 2023-04-01 DIAGNOSIS — R634 Abnormal weight loss: Secondary | ICD-10-CM | POA: Diagnosis not present

## 2023-04-01 DIAGNOSIS — R7301 Impaired fasting glucose: Secondary | ICD-10-CM | POA: Diagnosis not present

## 2023-04-01 LAB — CBC WITH DIFFERENTIAL/PLATELET
Basophils Absolute: 0 10*3/uL (ref 0.0–0.1)
Basophils Relative: 0.7 % (ref 0.0–3.0)
Eosinophils Absolute: 0 10*3/uL (ref 0.0–0.7)
Eosinophils Relative: 0.9 % (ref 0.0–5.0)
HCT: 37.1 % (ref 36.0–46.0)
Hemoglobin: 12.7 g/dL (ref 12.0–15.0)
Lymphocytes Relative: 30.2 % (ref 12.0–46.0)
Lymphs Abs: 1.3 10*3/uL (ref 0.7–4.0)
MCHC: 34.3 g/dL (ref 30.0–36.0)
MCV: 81.9 fl (ref 78.0–100.0)
Monocytes Absolute: 0.3 10*3/uL (ref 0.1–1.0)
Monocytes Relative: 6.1 % (ref 3.0–12.0)
Neutro Abs: 2.6 10*3/uL (ref 1.4–7.7)
Neutrophils Relative %: 62.1 % (ref 43.0–77.0)
Platelets: 217 10*3/uL (ref 150.0–400.0)
RBC: 4.53 Mil/uL (ref 3.87–5.11)
RDW: 14.5 % (ref 11.5–15.5)
WBC: 4.2 10*3/uL (ref 4.0–10.5)

## 2023-04-01 LAB — B12 AND FOLATE PANEL
Folate: 17.4 ng/mL (ref >5.9)
Vitamin B-12: 482 pg/mL (ref 211–911)

## 2023-04-01 LAB — COMPREHENSIVE METABOLIC PANEL
ALT: 36 U/L — ABNORMAL HIGH (ref 0–35)
AST: 35 U/L (ref 0–37)
Albumin: 4.1 g/dL (ref 3.5–5.2)
Alkaline Phosphatase: 57 U/L (ref 39–117)
BUN: 13 mg/dL (ref 6–23)
CO2: 32 meq/L (ref 19–32)
Calcium: 8.7 mg/dL (ref 8.4–10.5)
Chloride: 102 meq/L (ref 96–112)
Creatinine, Ser: 0.83 mg/dL (ref 0.40–1.20)
GFR: 71.45 mL/min (ref >60.00)
Glucose, Bld: 121 mg/dL — ABNORMAL HIGH (ref 70–99)
Potassium: 4.1 meq/L (ref 3.5–5.1)
Sodium: 138 meq/L (ref 135–145)
Total Bilirubin: 0.5 mg/dL (ref 0.2–1.2)
Total Protein: 6.5 g/dL (ref 6.0–8.3)

## 2023-04-01 LAB — CK: Total CK: 44 U/L (ref 7–177)

## 2023-04-01 LAB — HEMOGLOBIN A1C: Hgb A1c MFr Bld: 5.8 % (ref 4.6–6.5)

## 2023-04-01 LAB — SEDIMENTATION RATE: Sed Rate: 6 mm/h (ref 0–30)

## 2023-04-01 LAB — TSH: TSH: 0.81 u[IU]/mL (ref 0.35–5.50)

## 2023-04-01 LAB — C-REACTIVE PROTEIN: CRP: <1 mg/dL (ref 0.5–20.0)

## 2023-04-01 MED ORDER — SULFAMETHOXAZOLE-TRIMETHOPRIM 800-160 MG PO TABS
1.0000 | ORAL_TABLET | Freq: Two times a day (BID) | ORAL | 0 refills | Status: DC
  Filled 2023-04-01: qty 14, 7d supply, fill #0

## 2023-04-01 MED ORDER — DIAZEPAM 10 MG PO TABS
10.0000 mg | ORAL_TABLET | Freq: Every day | ORAL | 5 refills | Status: DC
  Filled 2023-04-01 – 2023-04-02 (×2): qty 30, 30d supply, fill #0
  Filled 2023-04-30: qty 30, 30d supply, fill #1
  Filled 2023-05-23 – 2023-05-29 (×4): qty 30, 30d supply, fill #2
  Filled 2023-06-20 – 2023-06-25 (×6): qty 30, 30d supply, fill #3
  Filled 2023-07-18 – 2023-07-22 (×3): qty 30, 30d supply, fill #4
  Filled 2023-08-12 – 2023-08-19 (×3): qty 30, 30d supply, fill #5

## 2023-04-01 MED ORDER — AMLODIPINE BESYLATE 5 MG PO TABS: 2.5000 mg | ORAL_TABLET | Freq: Every day | ORAL | Status: DC

## 2023-04-01 NOTE — Telephone Encounter (Signed)
 Copied from CRM [id number]. Topic: Clinical - Pink Word Triage >> Apr 01, 2023  8:06 AM Kathryne Eriksson wrote: Reason for Triage: Foggy Headedness >> Apr 01, 2023  8:08 AM Kathryne Eriksson wrote: Patient states she's been experiencing some hot flashes, extreme sweats to the point her hair is dripping wet as well as feeling foggy headed. Patient also states she's been experiencing some cramps in her left leg.  Apt made for patient today; Urology ordered another antibiotic for UTI.

## 2023-04-01 NOTE — Progress Notes (Signed)
 Subjective  CC:  Chief Complaint  Patient presents with   mental fog   Excessive Sweating    HPI: Denise Rice is a 73 y.o. female who presents to the office today to address the problems listed above in the chief complaint. 73 year old presents due to an episode of lightheadedness, associated with profound sweating last night while sitting in her chair.  She just taken a dose of Cipro.  On sure if this is related.  She feels drained not like her normal self.  Slightly off balance.  No focal neurologic deficits.  She is being treated for a recurrent UTI, to start Septra today.  I reviewed recent urology eval for recurrent UTIs.  Feels very tired and drained.  Also her weight is down about 5 or 6 pounds for unclear reasons.  She says she is eating normally.  No abdominal pain.  No diarrhea.  She also feels achy. We started blood pressure medicines recently for hypertension.  Blood pressure in the office today is low.  She denies orthostatic symptoms.  Home readings have been normal to mildly elevated.  She denies chest pain, palpitations or lower extremity edema. She has chronic vertigo and her symptoms currently are different.  Wt Readings from Last 3 Encounters:  04/01/23 121 lb 12.8 oz (55.2 kg)  03/11/23 126 lb (57.2 kg)  02/20/23 127 lb 4 oz (57.7 kg)    Assessment  1. Lightheaded   2. Essential (primary) hypertension   3. Excessive sweating   4. Weight loss, unintentional   5. Myalgia   6. IFG (impaired fasting glucose)   7. Vitamin B12 deficiency      Plan  Symptom complex of malaise, myalgias, sweats, weight loss, fatigue and disequilibrium: Unclear etiology, could be related to hypotension.  Hold amlodipine.  Push fluids.  Check lab work.  Close follow-up.  Follow up: if not improving 05/13/2023  Orders Placed This Encounter  Procedures   CBC with Differential/Platelet   CK   Comprehensive metabolic panel   Sedimentation rate   C-reactive protein   Hemoglobin A1c    TSH   B12 and Folate Panel   Meds ordered this encounter  Medications   amLODipine (NORVASC) 5 MG tablet    Sig: Take 0.5 tablets (2.5 mg total) by mouth daily.      I reviewed the patients updated PMH, FH, and SocHx.    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   Biotin 10 MG CAPS Take by mouth.   busPIRone (BUSPAR) 7.5 MG tablet Take 1 tablet (7.5 mg total) by mouth 2 (two) times daily as needed (anxiety).   Cholecalciferol (VITAMIN D) 50 MCG (2000 UT) CAPS Take 2,000 Units by mouth daily.   Cod Liver Oil CAPS Take 1 capsule by mouth daily.   cyanocobalamin (VITAMIN B12) 1000 MCG/ML injection Inject 1 mL (1,000 mcg total) into the muscle every 30 (thirty) days.   diazepam (VALIUM) 10 MG tablet Take 1 tablet (10 mg total) by mouth daily as needed  for anxiety   dicyclomine (BENTYL) 20 MG tablet Take 1 tablet (20 mg total) by mouth 2 (two) times daily as needed for spasms (abdominal pain).   ferrous sulfate 325 (65 FE) MG tablet Take 325 mg by mouth daily with breakfast.   mupirocin ointment (BACTROBAN) 2 % Apply to affected areas topically 2 (two) times daily.   ondansetron (ZOFRAN) 4 MG tablet Take 1 tablet (4 mg total) by mouth every 8 (eight) hours as needed for nausea or vomiting.   Potassium (POTASSIMIN PO) Take 1 tablet by mouth daily.   sulfamethoxazole-trimethoprim (BACTRIM DS) 800-160 MG tablet Take 1 tablet by mouth 2 (two) times daily.   Syringe/Needle, Disp, (SYRINGE 3CC/25GX1") 25G X 1" 3 ML MISC Use once monthly for injection of Vitamin B-12   traZODone (DESYREL) 100 MG tablet Take 1-1.5 tablets (100-150 mg total) by mouth at bedtime as needed for sleep.   triamcinolone cream (KENALOG) 0.1 % Apply 1 Application topically 2 (two) times daily for 2 weeks. Then as needed.   [DISCONTINUED] amLODipine (NORVASC) 5 MG tablet Take 1 tablet (5 mg total) by mouth daily.    Allergies: Patient is allergic to lactose, topiramate, and alprazolam. Family History: Patient family history includes Anxiety disorder in her mother; Cancer in her paternal aunt and paternal uncle; Cancer - Prostate in her father; Diabetes in her father, maternal aunt, mother, and sister; Heart attack in her mother; Heart disease in her father, maternal aunt, and mother; High blood pressure in her sister; Hyperlipidemia in her father and mother; Hypertension in her father and mother; Kidney disease in her father and mother; Obesity in her father and mother; Stroke in her father, mother, and sister; Thyroid disease in her mother. Social History:  Patient  reports that she has never smoked. She has never used smokeless tobacco. She reports that she does not drink alcohol and does not use drugs.  Review of Systems: Constitutional: Negative for fever malaise or  anorexia Cardiovascular: negative for chest pain Respiratory: negative for SOB or persistent cough Gastrointestinal: negative for abdominal pain  Objective  Vitals: BP 108/68 (BP Location: Right Arm, Patient Position: Sitting, Cuff Size: Small)   Pulse 66   Temp 98.1 F (36.7 C)   Ht 5' (1.524 m)   Wt 121 lb 12.8 oz (55.2 kg)   LMP 01/13/2005   SpO2 98%   BMI 23.79 kg/m  General: Appears tired but nontoxic, A&Ox3 HEENT: PEERL, conjunctiva normal, neck is supple Cardiovascular:  RRR without murmur or gallop.  Respiratory:  Good breath sounds bilaterally, CTAB with normal respiratory effort Gastrointestinal: soft, flat abdomen, normal active bowel sounds, no palpable masses, no hepatosplenomegaly, no appreciated hernias Skin:  Warm, no rashes  Commons side effects, risks, benefits, and alternatives for medications and treatment plan prescribed today were discussed, and the patient expressed understanding of the given instructions. Patient is instructed to call or message via MyChart if he/she has any questions or concerns regarding our treatment plan. No barriers to understanding were identified. We discussed Red Flag symptoms and signs in detail. Patient expressed understanding regarding what to do in case of urgent or emergency type symptoms.  Medication list was reconciled, printed and provided to the patient in AVS. Patient instructions and summary information was reviewed with the patient as documented in the AVS. This note was prepared with assistance of Dragon voice recognition software. Occasional wrong-word or sound-a-like substitutions may have occurred due to the inherent limitations of voice recognition software

## 2023-04-02 ENCOUNTER — Other Ambulatory Visit (HOSPITAL_COMMUNITY): Payer: Self-pay

## 2023-04-02 ENCOUNTER — Encounter: Payer: Self-pay | Admitting: Family

## 2023-04-02 NOTE — Telephone Encounter (Signed)
 Spoke with pharmacy to ok the early refill request

## 2023-04-06 ENCOUNTER — Encounter: Payer: Self-pay | Admitting: Family Medicine

## 2023-04-06 ENCOUNTER — Other Ambulatory Visit: Payer: Self-pay | Admitting: Family Medicine

## 2023-04-06 DIAGNOSIS — Z1231 Encounter for screening mammogram for malignant neoplasm of breast: Secondary | ICD-10-CM

## 2023-04-08 ENCOUNTER — Encounter: Payer: Self-pay | Admitting: Family Medicine

## 2023-04-08 ENCOUNTER — Other Ambulatory Visit: Payer: Self-pay

## 2023-04-08 DIAGNOSIS — N644 Mastodynia: Secondary | ICD-10-CM

## 2023-04-08 NOTE — Progress Notes (Signed)
 See mychart note Stevana,  Your lab results all look fine. Nothing to explain your symptoms. I hope you are feeling better since stopping the amlodipine.   Sincerely, Dr. Mardelle Matte

## 2023-04-09 ENCOUNTER — Other Ambulatory Visit: Payer: Self-pay

## 2023-04-09 ENCOUNTER — Encounter: Payer: Self-pay | Admitting: Family Medicine

## 2023-04-09 DIAGNOSIS — N644 Mastodynia: Secondary | ICD-10-CM

## 2023-04-09 NOTE — Progress Notes (Signed)
 Denise Rice

## 2023-04-13 ENCOUNTER — Other Ambulatory Visit: Payer: Self-pay | Admitting: Family Medicine

## 2023-04-13 DIAGNOSIS — N644 Mastodynia: Secondary | ICD-10-CM

## 2023-04-28 ENCOUNTER — Other Ambulatory Visit (HOSPITAL_COMMUNITY): Payer: Self-pay

## 2023-04-29 ENCOUNTER — Other Ambulatory Visit (HOSPITAL_COMMUNITY): Payer: Self-pay

## 2023-04-30 ENCOUNTER — Other Ambulatory Visit: Payer: Self-pay

## 2023-05-01 ENCOUNTER — Encounter: Payer: Self-pay | Admitting: Family Medicine

## 2023-05-05 ENCOUNTER — Encounter: Payer: Self-pay | Admitting: Pharmacist

## 2023-05-05 ENCOUNTER — Other Ambulatory Visit: Payer: Self-pay

## 2023-05-06 ENCOUNTER — Other Ambulatory Visit (HOSPITAL_COMMUNITY): Payer: Self-pay

## 2023-05-08 ENCOUNTER — Other Ambulatory Visit: Payer: Self-pay | Admitting: Family Medicine

## 2023-05-08 DIAGNOSIS — N644 Mastodynia: Secondary | ICD-10-CM

## 2023-05-13 ENCOUNTER — Ambulatory Visit: Payer: No Typology Code available for payment source | Admitting: Family Medicine

## 2023-05-13 ENCOUNTER — Encounter: Payer: Self-pay | Admitting: Family Medicine

## 2023-05-13 VITALS — BP 117/77 | HR 72 | Temp 98.2°F | Ht 60.0 in | Wt 123.4 lb

## 2023-05-13 DIAGNOSIS — Z9884 Bariatric surgery status: Secondary | ICD-10-CM

## 2023-05-13 DIAGNOSIS — R7301 Impaired fasting glucose: Secondary | ICD-10-CM

## 2023-05-13 DIAGNOSIS — R42 Dizziness and giddiness: Secondary | ICD-10-CM

## 2023-05-13 DIAGNOSIS — Z780 Asymptomatic menopausal state: Secondary | ICD-10-CM | POA: Diagnosis not present

## 2023-05-13 DIAGNOSIS — Z0001 Encounter for general adult medical examination with abnormal findings: Secondary | ICD-10-CM

## 2023-05-13 DIAGNOSIS — M810 Age-related osteoporosis without current pathological fracture: Secondary | ICD-10-CM

## 2023-05-13 DIAGNOSIS — F329 Major depressive disorder, single episode, unspecified: Secondary | ICD-10-CM

## 2023-05-13 DIAGNOSIS — I10 Essential (primary) hypertension: Secondary | ICD-10-CM | POA: Diagnosis not present

## 2023-05-13 DIAGNOSIS — Z0000 Encounter for general adult medical examination without abnormal findings: Secondary | ICD-10-CM

## 2023-05-13 NOTE — Progress Notes (Signed)
 Subjective  Chief Complaint  Patient presents with   Annual Exam    Pt here for Annual Exam and is not fasting. Mammogram is scheduled for tomorrow.   Hypertension    HPI: Denise Rice is a 73 y.o. female who presents to [facility] [place] today for a Female Wellness Visit. She also has the concerns and/or needs as listed above in the chief complaint. These will be addressed in addition to the Health Maintenance Visit.   Wellness Visit: annual visit with health maintenance review and exam  HM: mammo tomorrow. Due dexa. CRC screen current. Healthy lifestyle with good nutrition and regular exercise with trainer. Imms up to date Chronic disease f/u and/or acute problem visit: (deemed necessary to be done in addition to the wellness visit): Discussed the use of AI scribe software for clinical note transcription with the patient, who gave verbal consent to proceed.  History of Present Illness Denise Rice is a 73 year old female with hypertension who presents with headaches and elevated blood pressure.  She experiences headaches, particularly upon waking, described as feeling like 'a brick on my head'. These headaches occur on many days but not most days and are often accompanied by elevated blood pressure readings, such as 150/95 mmHg. She manages these symptoms with extra strength Tylenol  and her blood pressure medication, amlodipine .  She is currently taking amlodipine  10 mg daily in the morning for hypertension. Her usual blood pressure is around 117 mmHg systolic, but it spikes on days when she has headaches. On days without headaches, her blood pressure is around 123/82 mmHg.  She has a history of vertigo, which causes lightheadedness, but there is no recent increase in this symptom.  She experiences chronic swelling in her left ankle, which she attributes to long-standing issues rather than recent changes in medication. The swelling has not worsened since increasing  her amlodipine  dose to 10 mg.  She has a history of sleep disturbances and is currently taking trazodone  at night, but often wakes up after three hours. She sometimes takes an additional half tablet of trazodone  if she wakes up early. She has tried other sleep aids in the past, such as Ambien  and Lunesta, but experienced adverse effects like nightmares.  Her nutrition is stable, and she maintains a routine of bringing her own breakfast to work, which includes two hard-boiled eggs and a plain slice of toast or bagel, along with fruit for a snack.   Assessment  1. Encounter for well adult exam with abnormal findings   2. Chronic vertigo   3. History of laparoscopic adjustable gastric banding   4. IFG (impaired fasting glucose)   5. Major depression, chronic   6. Osteoporosis of forearm   7. Essential (primary) hypertension   8. Asymptomatic menopausal state      Plan  Female Wellness Visit: Age appropriate Health Maintenance and Prevention measures were discussed with patient. Included topics are cancer screening recommendations, ways to keep healthy (see AVS) including dietary and exercise recommendations, regular eye and dental care, use of seat belts, and avoidance of moderate alcohol use and tobacco use.  BMI: discussed patient's BMI and encouraged positive lifestyle modifications to help get to or maintain a target BMI. HM needs and immunizations were addressed and ordered. See below for orders. See HM and immunization section for updates. Routine labs and screening tests ordered including cmp, cbc and lipids where appropriate. Discussed recommendations regarding Vit D and calcium supplementation (see AVS)  Chronic disease management visit and/or acute problem visit: Assessment and Plan Assessment & Plan Hypertension Blood pressure controlled with amlodipine  10 mg daily. Occasional elevations possibly related to headaches. No hypertensive urgency. - Continue amlodipine  10 mg  daily. - Take amlodipine  at night. - Monitor blood pressure regularly. - Recheck blood pressure in 6 months.  Headaches: no red flag sxs. Monitor. Tylenol  prn.   Ankle swelling due to amlodipine  Chronic left ankle swelling likely exacerbated by amlodipine . Potential venous insufficiency. - Consider venous insufficiency as a contributing factor.  Sleep disturbance Chronic sleep disturbance with limited effectiveness of trazodone . Non-pharmacological interventions discussed. - Try sleep meditation app. - Avoid taking extra trazodone . - Consider non-pharmacological sleep aids.  Vertigo Chronic vertigo with no recent exacerbations.  Osteoporosis Due for bone density scan. Previous scan showed mild thinning in hip and forearm. - Schedule bone density scan. - Discuss results and potential treatment options after scan.  Wellness Visit Routine wellness visit. Discussed health status, including blood pressure management and sleep disturbances. - Continue current exercise regimen. - Monitor blood pressure regularly. - Schedule bone density scan. - Attend scheduled mammogram.   Follow up: 6 mo for bp recheck  Orders Placed This Encounter  Procedures   DG Bone Density   No orders of the defined types were placed in this encounter.     Body mass index is 24.1 kg/m. Wt Readings from Last 3 Encounters:  05/13/23 123 lb 6.4 oz (56 kg)  04/01/23 121 lb 12.8 oz (55.2 kg)  03/11/23 126 lb (57.2 kg)     Patient Active Problem List   Diagnosis Date Noted Date Diagnosed   Essential (primary) hypertension 02/20/2023     Priority: High   Bilateral sensorineural hearing loss 09/06/2014     Priority: High   Osteoporosis of forearm 04/17/2021     Priority: Medium     dexa 03/2021: forearm T =-2.6, hip with osteopenia. 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 06/25/2012     Priority: Medium    History of laparoscopic adjustable gastric banding 01/22/2011     Priority: Medium    Vitamin B12 deficiency 07/29/2018     Priority: Low    Started IM injections monthly 06/2018    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     Priority: Low   Vitamin D  deficiency 01/15/2008     Priority: Low   Hypomagnesemia 02/20/2023    Hypocalcemia 12/20/2021    Iron deficiency anemia 10/12/2020    Health Maintenance  Topic Date Due   Zoster Vaccines- Shingrix  (2 of 2) 07/04/2021   DEXA SCAN  04/13/2023   COVID-19 Vaccine (5 - 2024-25 season) 05/29/2023 (Originally 09/14/2022)   MAMMOGRAM  05/16/2023   INFLUENZA VACCINE  08/14/2023   Medicare Annual Wellness (AWV)  11/06/2023   DTaP/Tdap/Td (4 - Td or Tdap) 08/13/2026   Colonoscopy  02/13/2032   Pneumonia Vaccine 65+  Years old  Completed   Hepatitis C Screening  Completed   HPV VACCINES  Aged Out   Meningococcal B Vaccine  Aged Out   Immunization History  Administered Date(s) Administered   Fluad Quad(high Dose 65+) 10/01/2018, 09/23/2019, 10/25/2020, 10/15/2021   Fluad Trivalent(High Dose 65+) 09/29/2022   Fluzone Influenza virus vaccine,trivalent (IIV3), split virus 10/08/2011   Hepatitis A 08/01/2015, 02/06/2016   Hepatitis B 08/01/2015, 10/03/2015, 02/06/2016   Influenza-Unspecified 09/29/2014, 09/26/2016, 10/01/2018   PFIZER(Purple Top)SARS-COV-2 Vaccination 01/24/2019, 02/11/2019, 10/27/2019   Pfizer Covid-19 Vaccine Bivalent Booster 85yrs & up 12/12/2020   Pneumococcal Conjugate-13 08/25/2014   Pneumococcal Polysaccharide-23 08/15/2013, 06/23/2018   Td  02/14/1995   Tdap 02/28/2009, 08/12/2016   Zoster Recombinant(Shingrix ) 05/09/2021   Zoster, Live 05/19/2013   We updated and reviewed the patient's past history in detail and it is documented below. Allergies: Patient is allergic to lactose, topiramate , and alprazolam . 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 Past Surgical History Patient  has a past surgical history that includes Vaginal hysterectomy (2007); Roux-en-Y Gastric Bypass (AUG 2013); Laparoscopic gastric banding (02-08-2007); Strabismus surgery (Right); Laparoscopic cholecystectomy (03-31-2005); Implantation bone anchored hearing aid (Left, 07/27/2008); Stapedes surgery (Left, 1985;  1994 x2;  02-03-1998); Blepharoplasty (03-05-2000); Laser ablation condolamata (N/A, 07/30/2012); Hammer toe surgery (Left, 08-29-2013); LASIK (Bilateral, 2002); Craniotomy (08/2014); laparoscopy (N/A, 11/17/2017); and Lysis of adhesion (N/A, 11/17/2017). Family History: Patient family history includes Anxiety disorder in her mother; Cancer in her paternal aunt and paternal uncle; Cancer - Prostate in her father; Diabetes in her father, maternal aunt, mother, and sister; Heart attack in her mother; Heart disease in her father, maternal aunt, and mother; High blood pressure in her sister; Hyperlipidemia in her father and mother; Hypertension in her father and mother; Kidney disease in her father and mother; Obesity in her father and mother;  Stroke in her father, mother, and sister; Thyroid  disease in her mother. Social History:  Patient  reports that she has never smoked. She has never used smokeless tobacco. She reports that she does not drink alcohol and does not use drugs.  Review of Systems: Constitutional: negative for fever or malaise Ophthalmic: negative for photophobia, double vision or loss of vision Cardiovascular: negative for chest pain, dyspnea on exertion, or new LE swelling Respiratory: negative for SOB or persistent cough Gastrointestinal: negative for abdominal pain, change in bowel habits or melena Genitourinary: negative for dysuria or gross hematuria, no abnormal uterine bleeding or disharge Musculoskeletal: negative for new gait disturbance or muscular weakness Integumentary: negative for new or persistent rashes, no breast lumps Neurological: negative for TIA or stroke symptoms Psychiatric: negative for SI or delusions Allergic/Immunologic: negative for hives  Patient Care Team    Relationship Specialty Notifications Start End  Luevenia Saha, MD PCP - General Family Medicine  10/14/21   Lillian Rein, MD Consulting Physician Gynecology  03/11/17   Glory Larsen, MD Consulting Physician Neurology  11/25/17   Rey Catholic, MD Consulting Physician Sports Medicine  05/04/19     Objective  Vitals: BP 117/77   Pulse 72   Temp 98.2 F (36.8 C)  Ht 5' (1.524 m)   Wt 123 lb 6.4 oz (56 kg)   LMP 01/13/2005   SpO2 100%   BMI 24.10 kg/m  General:  Well developed, well nourished, no acute distress  Psych:  Alert and orientedx3,normal mood and affect HEENT:  Normocephalic, atraumatic, non-icteric sclera,  supple neck without adenopathy, mass or thyromegaly Cardiovascular:  Normal S1, S2, RRR without gallop, rub or murmur Respiratory:  Good breath sounds bilaterally, CTAB with normal respiratory effort Gastrointestinal: normal bowel sounds, soft, non-tender, no noted masses. No HSM MSK: extremities  without edema, joints without erythema or swelling Neurologic:    Mental status is normal.  Gross motor and sensory exams are normal.  No tremor  Commons side effects, risks, benefits, and alternatives for medications and treatment plan prescribed today were discussed, and the patient expressed understanding of the given instructions. Patient is instructed to call or message via MyChart if he/she has any questions or concerns regarding our treatment plan. No barriers to understanding were identified. We discussed Red Flag symptoms and signs in detail. Patient expressed understanding regarding what to do in case of urgent or emergency type symptoms.  Medication list was reconciled, printed and provided to the patient in AVS. Patient instructions and summary information was reviewed with the patient as documented in the AVS. This note was prepared with assistance of Dragon voice recognition software. Occasional wrong-word or sound-a-like substitutions may have occurred due to the inherent limitations of voice recognition software

## 2023-05-13 NOTE — Patient Instructions (Signed)
 Please return in 6 months for hypertension follow up.   If you have any questions or concerns, please don't hesitate to send me a message via MyChart or call the office at [phone]. Thank you for visiting with us  today! It's our pleasure caring for you.    VISIT SUMMARY: Today, you were seen for headaches and elevated blood pressure. We discussed your current medications, including amlodipine  for hypertension and trazodone  for sleep disturbances. We also reviewed your history of vertigo, ankle swelling, and osteoporosis.  YOUR PLAN: -HYPERTENSION: Hypertension means high blood pressure. Your blood pressure is generally well-controlled with amlodipine  10 mg daily, but it occasionally spikes, possibly due to headaches. Continue taking amlodipine  10 mg daily, but switch to taking it at night. Monitor your blood pressure regularly and we will recheck it in 6 months.  -ANKLE SWELLING: Your chronic left ankle swelling may be exacerbated by your blood pressure medication, amlodipine , and could also be due to venous insufficiency, which is when veins have trouble sending blood from your limbs back to your heart. We will consider venous insufficiency as a contributing factor.  -SLEEP DISTURBANCE: You have chronic sleep disturbances and trazodone  has limited effectiveness. We discussed trying a sleep meditation app and avoiding taking extra trazodone . Consider non-drug methods to help with sleep.  -VERTIGO: Vertigo is a sensation of spinning or dizziness. Your vertigo is chronic but has not worsened recently.  -OSTEOPOROSIS: Osteoporosis is a condition where bones become weak and brittle. You are due for a bone density scan to check the status of your bones. We will discuss the results and potential treatment options after the scan.  -WELLNESS VISIT: This was a routine wellness visit where we discussed your overall health, including blood pressure management and sleep disturbances. Continue your current  exercise regimen and monitor your blood pressure regularly. Schedule your bone density scan and attend your scheduled mammogram.  INSTRUCTIONS: Please monitor your blood pressure regularly and take your amlodipine  at night. Schedule a bone density scan and attend your scheduled mammogram. We will recheck your blood pressure in 6 months.                      Contains text generated by [employer]bridge.                                 Contains text generated by [employer]bridge.

## 2023-05-14 ENCOUNTER — Ambulatory Visit

## 2023-05-14 ENCOUNTER — Ambulatory Visit
Admission: RE | Admit: 2023-05-14 | Discharge: 2023-05-14 | Disposition: A | Discharge status: Home / Self Care | Source: Ambulatory Visit | Attending: Family Medicine | Admitting: Family Medicine

## 2023-05-14 DIAGNOSIS — N644 Mastodynia: Secondary | ICD-10-CM | POA: Diagnosis not present

## 2023-05-18 ENCOUNTER — Encounter: Payer: Self-pay | Admitting: Family Medicine

## 2023-05-18 ENCOUNTER — Other Ambulatory Visit (HOSPITAL_COMMUNITY): Payer: Self-pay

## 2023-05-18 ENCOUNTER — Ambulatory Visit: Payer: Self-pay

## 2023-05-18 DIAGNOSIS — S70362A Insect bite (nonvenomous), left thigh, initial encounter: Secondary | ICD-10-CM | POA: Diagnosis not present

## 2023-05-18 DIAGNOSIS — L03116 Cellulitis of left lower limb: Secondary | ICD-10-CM | POA: Diagnosis not present

## 2023-05-18 MED ORDER — DOXYCYCLINE HYCLATE 100 MG PO TABS
100.0000 mg | ORAL_TABLET | Freq: Two times a day (BID) | ORAL | 0 refills | Status: DC
  Filled 2023-05-18 – 2023-05-19 (×2): qty 20, 10d supply, fill #0

## 2023-05-18 NOTE — Telephone Encounter (Signed)
 Noted.

## 2023-05-18 NOTE — Telephone Encounter (Signed)
 Copied from CRM [id number]. Topic: Clinical - Red Word Triage >> May 18, 2023  8:07 AM Oddis Bench wrote: Red Word that prompted transfer to Nurse Triage: Patient is calling about a spider bite on her leg she is hurting and swelling.  Chief Complaint: insect bite Symptoms: Red, burning, bullseye appearance Frequency: constant Pertinent Negatives: Patient denies difficulty Disposition: [] ED /[x] Urgent Care (no appt availability in office) / [] Appointment(In office/virtual)/ []  [facility]/ [] Home Care/ [] Refused Recommended Disposition /[] [facility]/ []  Follow-up with PCP Additional Notes: Unsure of what type of insect bite but reports redness and swelling on the left thight, says it has a bullseye appearance. Pt endorses mild burning sensation. No avail appts today. Pt will go to UC this morning    Reason for Disposition  [1] Red or very tender (to touch) area AND [2] getting larger over 48 hours after the bite  Answer Assessment - Initial Assessment Questions 1. TYPE of INSECT: "What type of insect was it?"      Unsure of what type of insect  2. ONSET: "When did you get bitten?"      Over the weekend  3. LOCATION: "Where is the insect bite located?"      On the side of left thigh  4. REDNESS: "Is the area red or pink?" If Yes, ask: "What size is area of redness?" (inches or cm). "When did the redness start?"     Redness started this morning, middle portion of the area is size of quarter. Has a bullseye appearance  5. PAIN: "Is there any pain?" If Yes, ask: "How bad is it?"  (Scale 1-10; or mild, moderate, severe)     Mild pain yesterday  6. ITCHING: "Does it itch?" If Yes, ask: "How bad is the itch?"    - MILD: doesn't interfere with normal activities   - MODERATE-SEVERE: interferes with work, school, sleep, or other activities      No itching, mild burning  7. SWELLING: "How big is the swelling?" (inches, cm, or compare to coins)     Yes, in the middle   8.  OTHER SYMPTOMS: "Do you have any other symptoms?"  (e.g., difficulty breathing, hives)     No  9. PREGNANCY: "Is there any chance you are pregnant?" "When was your last menstrual period?"     No  Protocols used: Insect Bite-A-AH

## 2023-05-19 ENCOUNTER — Other Ambulatory Visit (HOSPITAL_COMMUNITY): Payer: Self-pay

## 2023-05-23 ENCOUNTER — Other Ambulatory Visit (HOSPITAL_COMMUNITY): Payer: Self-pay

## 2023-05-23 ENCOUNTER — Other Ambulatory Visit: Payer: Self-pay | Admitting: Family Medicine

## 2023-05-23 DIAGNOSIS — I10 Essential (primary) hypertension: Secondary | ICD-10-CM

## 2023-05-24 ENCOUNTER — Other Ambulatory Visit (HOSPITAL_COMMUNITY): Payer: Self-pay

## 2023-05-25 ENCOUNTER — Other Ambulatory Visit: Payer: Self-pay

## 2023-05-25 ENCOUNTER — Other Ambulatory Visit (HOSPITAL_BASED_OUTPATIENT_CLINIC_OR_DEPARTMENT_OTHER): Payer: Self-pay

## 2023-05-25 ENCOUNTER — Encounter: Payer: Self-pay | Admitting: Family Medicine

## 2023-05-25 ENCOUNTER — Ambulatory Visit (INDEPENDENT_AMBULATORY_CARE_PROVIDER_SITE_OTHER): Admitting: Family Medicine

## 2023-05-25 ENCOUNTER — Other Ambulatory Visit (HOSPITAL_COMMUNITY): Payer: Self-pay

## 2023-05-25 VITALS — BP 129/81 | HR 74 | Temp 98.1°F | Ht 60.0 in | Wt 125.4 lb

## 2023-05-25 DIAGNOSIS — T63331D Toxic effect of venom of brown recluse spider, accidental (unintentional), subsequent encounter: Secondary | ICD-10-CM | POA: Diagnosis not present

## 2023-05-25 MED ORDER — TRAMADOL HCL 50 MG PO TABS
50.0000 mg | ORAL_TABLET | Freq: Four times a day (QID) | ORAL | 0 refills | Status: DC | PRN
  Filled 2023-05-25: qty 30, 7d supply, fill #0

## 2023-05-25 MED ORDER — AMLODIPINE BESYLATE 5 MG PO TABS: 2.5000 mg | ORAL_TABLET | Freq: Every day | ORAL | 2 refills | Status: DC

## 2023-05-25 MED ORDER — TRAZODONE HCL 100 MG PO TABS
100.0000 mg | ORAL_TABLET | Freq: Every evening | ORAL | 3 refills | Status: DC | PRN
  Filled 2023-05-25: qty 45, 30d supply, fill #0
  Filled 2023-05-27 – 2023-06-20 (×2): qty 45, 30d supply, fill #1
  Filled 2023-07-18 – 2023-07-21 (×2): qty 45, 30d supply, fill #2
  Filled 2023-08-12 – 2023-08-13 (×2): qty 45, 30d supply, fill #3

## 2023-05-25 NOTE — Progress Notes (Signed)
 Subjective  CC:  Chief Complaint  Patient presents with   Insect Bite    Pt stated that she got bit by a spider on her Lt thigh about 2 weeks ago. Lt leg is hurting.     HPI: Denise Rice is a 73 y.o. female who presents to the office today to address the problems listed above in the chief complaint. Discussed the use of AI scribe software for clinical note transcription with the patient, who gave verbal consent to proceed.  History of Present Illness Denise Rice is a 73 year old female who presents with a worsening leg lesion following a suspected spider bite.  Two weeks ago, she experienced a suspected spider bite while gardening and inspecting her crawl space. She initially sought care at [facility] urgent [facility] due to severe pain and a bullseye appearance of the lesion.  The lesion has worsened over the past two weeks despite treatment with doxycycline . It now has a dark center with a large blister, and she experiences significant pain radiating down her leg. The pain is exacerbated by prolonged sitting and walking, and is particularly severe upon waking in the morning.  She has been taking Advil  for pain relief, but finds it only minimally effective. No other symptoms aside from the localized pain and discomfort in her leg.  I reviewed uc notes. And pt pics Assessment  1. Brown recluse spider bite or sting, accidental or unintentional, subsequent encounter      Plan  Assessment and Plan Assessment & Plan Spider bite with inflammation and pain Suspected brown recluse bite with persistent inflammation and pain. Initial doxycycline  ineffective. Lesion evolved to dark red/black blister with tenderness. Concern for underlying inflammation and potential necrosis. Severe pain impacts daily activities. - Manage pain with analgesics. Tramadol  ordered Discussed skin care.  - Advised against opening blister to prevent complications. - Monitor lesion for changes in size, color, or  necrosis.    Follow up: prn No orders of the defined types were placed in this encounter.  Meds ordered this encounter  Medications   traMADol  (ULTRAM ) 50 MG tablet    Sig: Take 1 tablet (50 mg total) by mouth every 6 (six) hours as needed for moderate pain (pain score 4-6).    Dispense:  30 tablet    Refill:  0     I reviewed the patients updated PMH, FH, and SocHx.  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    Priority: Medium    IFG (impaired fasting glucose) 11/16/2020    Priority: Medium    History of migraine 05/04/2019    Priority: Medium    History of small bowel obstruction 11/17/2017    Priority: Medium    Chronic vertigo 03/11/2017    Priority: Medium    Major depression, chronic 03/11/2017    Priority: Medium    PCOS (polycystic ovarian syndrome) 03/11/2017    Priority: Medium    Insomnia 09/08/2015    Priority: Medium    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   amLODipine  (NORVASC ) 5 MG tablet Take 0.5 tablets (2.5 mg total) by mouth daily. (Patient taking differently: Take 2.5 mg by mouth daily. Pt stated that she is taking 2 to help keep bp down)   Biotin 10 MG CAPS Take by mouth.   busPIRone  (BUSPAR ) 7.5 MG tablet Take 1 tablet (7.5 mg total) by mouth 2 (two) times daily as  needed (anxiety).   Cholecalciferol (VITAMIN D ) 50 MCG (2000 UT) CAPS Take 2,000 Units by mouth daily.   Cod Liver Oil CAPS Take 1 capsule by mouth daily.   cyanocobalamin  (VITAMIN B12) 1000 MCG/ML injection Inject 1 mL (1,000 mcg total) into the muscle every 30 (thirty) days.   diazepam  (VALIUM ) 10 MG tablet Take 1 tablet (10 mg total) by mouth daily as needed for anxiety   dicyclomine  (BENTYL ) 20 MG tablet Take 1 tablet (20 mg total) by mouth 2 (two) times daily as needed for spasms (abdominal pain).   doxycycline  (VIBRA -TABS) 100 MG tablet Take 1 tablet (100 mg total) by mouth 2 (two) times a day for 10 days. Take with 8 oz water. Do not lie down for at least 30 minutes after.   ferrous sulfate 325 (65 FE) MG tablet Take 325 mg by mouth daily with breakfast.   mupirocin  ointment (BACTROBAN ) 2 % Apply to affected areas topically 2 (two) times daily.   ondansetron  (ZOFRAN ) 4 MG tablet Take 1 tablet (4 mg total) by mouth every 8 (eight) hours as needed for nausea or vomiting.   Potassium (POTASSIMIN PO) Take 1 tablet by mouth daily.   Syringe/Needle, Disp, (SYRINGE 3CC/25GX1") 25G X 1" 3 ML MISC Use once monthly for injection of Vitamin B-12   traMADol  (ULTRAM ) 50 MG tablet Take 1 tablet (50 mg total) by mouth every 6 (six) hours as needed for moderate pain (pain score 4-6).   traZODone  (DESYREL ) 100 MG tablet Take 1-1.5 tablets (100-150 mg total) by mouth at bedtime as needed for sleep.   triamcinolone  cream (KENALOG ) 0.1 % Apply 1 Application topically 2 (two) times daily for 2 weeks. Then as needed.   Allergies: Patient is allergic to lactose, topiramate , and alprazolam . Family History: Patient family history includes Anxiety disorder in her mother; Cancer in her paternal aunt and paternal uncle; Cancer - Prostate in her father; Diabetes in her father, maternal aunt, mother, and sister; Heart attack in her mother; Heart disease in her father, maternal aunt, and mother; High blood pressure in  her sister; Hyperlipidemia in her father and mother; Hypertension in her father and mother; Kidney disease in her father and mother; Obesity in her father and mother; Stroke in her father, mother, and sister; Thyroid  disease in her mother. Social History:  Patient  reports that she has never smoked. She has never used smokeless tobacco. She reports that she does not drink alcohol and does not use drugs.  Review of Systems: Constitutional: Negative for fever malaise or anorexia Cardiovascular: negative for chest pain Respiratory: negative for SOB or persistent cough Gastrointestinal: negative for abdominal pain  Objective  Vitals: BP 129/81   Pulse 74   Temp 98.1 F (36.7 C)   Ht 5' (1.524 m)   Wt 125 lb 6.4 oz (56.9 kg)  LMP 01/13/2005   PF 100 L/min   BMI 24.49 kg/m  General: no acute distress , A&Ox3 Lateral left upper thigh: blistered lesion with central darkening and surrounding erythema w/o significant necrosis, no drainage. + ttp [facility] side effects, risks, benefits, and alternatives for medications and treatment plan prescribed today were discussed, and the patient expressed understanding of the given instructions. Patient is instructed to call or message via MyChart if he/she has any questions or concerns regarding our treatment plan. No barriers to understanding were identified. We discussed Red Flag symptoms and signs in detail. Patient expressed understanding regarding what to do in case of urgent or emergency type symptoms.  Medication list was reconciled, printed and provided to the patient in AVS. Patient instructions and summary information was reviewed with the patient as documented in the AVS. This note was prepared with assistance of Dragon voice recognition software. Occasional wrong-word or sound-a-like substitutions may have occurred due to the inherent limitations of voice recognition software

## 2023-05-25 NOTE — Patient Instructions (Signed)
 Spider Bite: What to Know Spider bites are not common. When spider bites do happen, most don't cause serious health problems. There are only a few types of spider bites that can cause serious health problems. What are the causes? A spider bite is often caused by a person accidentally making contact with a spider in a way that traps the spider against the skin. What increases the risk? You're more likely to be bitten by a spider if: You live in an area where spiders live, and you disturb their habitat. You work outdoors, such as working as a Clinical biochemist. You do certain outdoor activities, like playing in leaves or hiking. What are the signs or symptoms? Some spider bites may cause symptoms within 1 hour after the bite. For other spider bites, it may take 1-2 days for symptoms to appear. Common symptoms include: A raised red area on your skin. Redness and swelling around the area of the bite. Pain in the area of the bite. A few types of spiders can inject poison, or venom, into a bite wound. These include black widow and brown recluse spiders. Bites from these spiders cause more serious symptoms, such as: Muscle cramps. Feeling like you may throw up. Throwing up. Belly pain. A fever. A skin sore that spreads. This can break into an open wound. Feeling light-headed or dizzy. How is this diagnosed? A spider bite may be diagnosed based on your symptoms and a physical exam. Your health care provider will ask for any details you have about the spider and how the bite happened. This may help to find out what type of spider bit you. How is this treated? Many spider bites don't need treatment. If needed, treatment may include: Icing and keeping the bite area raised. Medicines to help control symptoms like pain and itching. This might be medicines taken by mouth or put on the bite area. A tetanus shot. Antibiotics. Follow these instructions at home: Medicines Take or apply your  medicines only as told. If you were given antibiotics, take or use them as told. Do not stop taking or using them even if you start to feel better. Managing pain and swelling  Use ice or an ice pack as told. Place a towel between your skin and the ice. Leave the ice on for 20 minutes, 2-3 times a day. If your skin turns red, take off the ice right away to prevent skin damage. The risk of damage is higher if you can't feel pain, heat, or cold. Raise the bite area above the level of your heart while you're sitting or lying down. Use pillows as needed. General instructions  Do not scratch the bite area. Keep the bite area clean and dry. Wash the area with soap and water each day as told by your provider. Keep all follow-up visits if your provider needs to check on your bite. Contact a health care provider if: Your bite doesn't get better after 3 days of treatment. Your bite turns black or purple. You have more redness, swelling, or pain at the site of the bite. Get help right away if: You get shortness of breath or chest pain. You have fluid, blood, or pus coming from the bite area. You have painful muscle cramps or sudden muscle tightening (spasms). You have belly pain. You feel like you may throw up or you do throw up. You feel more tired or sleepy than normal. These symptoms may be an emergency. Call 911 right away. Do not  wait to see if the symptoms will go away. Do not drive yourself to the hospital. This information is not intended to replace advice given to you by your health care provider. Make sure you discuss any questions you have with your health care provider. Document Revised: 11/11/2022 Document Reviewed: 05/30/2022 Elsevier Patient Education  2024 [employer].

## 2023-05-26 ENCOUNTER — Encounter (INDEPENDENT_AMBULATORY_CARE_PROVIDER_SITE_OTHER): Payer: Self-pay | Admitting: Family Medicine

## 2023-05-26 DIAGNOSIS — N3000 Acute cystitis without hematuria: Secondary | ICD-10-CM

## 2023-05-26 MED ORDER — SULFAMETHOXAZOLE-TRIMETHOPRIM 800-160 MG PO TABS
1.0000 | ORAL_TABLET | Freq: Two times a day (BID) | ORAL | 0 refills | Status: DC
  Filled 2023-05-26: qty 10, 5d supply, fill #0

## 2023-05-26 NOTE — Telephone Encounter (Signed)
A total of 6 minutes were spent by me to personally review the patient-generated inquiry, review patient records and data pertinent to assessment of the patient's problem, develop a management plan including generation of prescriptions and/or orders, and on subsequent communication with the patient through secure the MyChart portal service. There is no separately reported E/M service related to this service in the past 7 days nor does the patient have an upcoming soonest available appointment for this issue. This work was completed in less than 7 days.       

## 2023-05-27 ENCOUNTER — Other Ambulatory Visit (HOSPITAL_COMMUNITY): Payer: Self-pay

## 2023-05-27 ENCOUNTER — Other Ambulatory Visit: Payer: Self-pay

## 2023-05-28 ENCOUNTER — Other Ambulatory Visit: Payer: Self-pay

## 2023-05-29 ENCOUNTER — Other Ambulatory Visit (HOSPITAL_COMMUNITY): Payer: Self-pay

## 2023-06-01 ENCOUNTER — Other Ambulatory Visit: Payer: Self-pay

## 2023-06-02 ENCOUNTER — Other Ambulatory Visit

## 2023-06-02 ENCOUNTER — Encounter: Payer: Self-pay | Admitting: Family Medicine

## 2023-06-02 ENCOUNTER — Other Ambulatory Visit (HOSPITAL_COMMUNITY): Payer: Self-pay

## 2023-06-02 ENCOUNTER — Encounter

## 2023-06-02 ENCOUNTER — Other Ambulatory Visit: Payer: Self-pay | Admitting: Family Medicine

## 2023-06-02 ENCOUNTER — Other Ambulatory Visit: Payer: Self-pay

## 2023-06-02 MED ORDER — DICYCLOMINE HCL 20 MG PO TABS
20.0000 mg | ORAL_TABLET | Freq: Two times a day (BID) | ORAL | 0 refills | Status: DC | PRN
  Filled 2023-06-02: qty 20, 10d supply, fill #0

## 2023-06-17 DIAGNOSIS — H16223 Keratoconjunctivitis sicca, not specified as Sjogren's, bilateral: Secondary | ICD-10-CM | POA: Diagnosis not present

## 2023-06-17 DIAGNOSIS — D3131 Benign neoplasm of right choroid: Secondary | ICD-10-CM | POA: Diagnosis not present

## 2023-06-17 DIAGNOSIS — D3132 Benign neoplasm of left choroid: Secondary | ICD-10-CM | POA: Diagnosis not present

## 2023-06-17 DIAGNOSIS — H18832 Recurrent erosion of cornea, left eye: Secondary | ICD-10-CM | POA: Diagnosis not present

## 2023-06-17 DIAGNOSIS — Z961 Presence of intraocular lens: Secondary | ICD-10-CM | POA: Diagnosis not present

## 2023-06-17 DIAGNOSIS — H0220C Unspecified lagophthalmos, bilateral, upper and lower eyelids: Secondary | ICD-10-CM | POA: Diagnosis not present

## 2023-06-22 ENCOUNTER — Other Ambulatory Visit (HOSPITAL_COMMUNITY): Payer: Self-pay

## 2023-06-23 ENCOUNTER — Encounter: Payer: Self-pay | Admitting: Family Medicine

## 2023-06-25 ENCOUNTER — Other Ambulatory Visit: Payer: Self-pay

## 2023-06-25 ENCOUNTER — Other Ambulatory Visit (HOSPITAL_COMMUNITY): Payer: Self-pay

## 2023-06-25 ENCOUNTER — Other Ambulatory Visit (HOSPITAL_BASED_OUTPATIENT_CLINIC_OR_DEPARTMENT_OTHER): Payer: Self-pay

## 2023-06-26 ENCOUNTER — Other Ambulatory Visit: Payer: Self-pay

## 2023-06-26 DIAGNOSIS — Z13820 Encounter for screening for osteoporosis: Secondary | ICD-10-CM

## 2023-06-26 NOTE — Telephone Encounter (Signed)
 Referral sent to [facility]

## 2023-06-30 ENCOUNTER — Other Ambulatory Visit: Payer: Self-pay

## 2023-06-30 DIAGNOSIS — M810 Age-related osteoporosis without current pathological fracture: Secondary | ICD-10-CM

## 2023-07-09 DIAGNOSIS — M8588 Other specified disorders of bone density and structure, other site: Secondary | ICD-10-CM | POA: Diagnosis not present

## 2023-07-09 DIAGNOSIS — N958 Other specified menopausal and perimenopausal disorders: Secondary | ICD-10-CM | POA: Diagnosis not present

## 2023-07-09 LAB — HM DEXA SCAN

## 2023-07-13 ENCOUNTER — Encounter: Payer: Self-pay | Admitting: Family Medicine

## 2023-07-16 ENCOUNTER — Ambulatory Visit: Admitting: Family Medicine

## 2023-07-18 ENCOUNTER — Other Ambulatory Visit (HOSPITAL_COMMUNITY): Payer: Self-pay

## 2023-07-18 ENCOUNTER — Other Ambulatory Visit: Payer: Self-pay | Admitting: Family Medicine

## 2023-07-19 ENCOUNTER — Ambulatory Visit: Payer: Self-pay | Admitting: Family Medicine

## 2023-07-19 MED ORDER — TRAMADOL HCL 50 MG PO TABS
50.0000 mg | ORAL_TABLET | Freq: Four times a day (QID) | ORAL | 0 refills | Status: DC | PRN
  Filled 2023-07-19 – 2023-07-21 (×2): qty 30, 8d supply, fill #0

## 2023-07-19 NOTE — Progress Notes (Signed)
 See mychart note [facility]: lowest T =-2.3 left femoral neck;  Last dexa 2023 BC: wrist with T = -2.9. exercise improved.  Dear Denise Rice, Thank you for having your bone density testing done. 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 But, things are stable; we will recheck in 2 years. Your exercise helps your bone density.  Sincerely, Dr. Jodie

## 2023-07-20 ENCOUNTER — Other Ambulatory Visit (HOSPITAL_COMMUNITY): Payer: Self-pay

## 2023-07-20 ENCOUNTER — Other Ambulatory Visit: Payer: Self-pay

## 2023-07-20 ENCOUNTER — Other Ambulatory Visit (HOSPITAL_BASED_OUTPATIENT_CLINIC_OR_DEPARTMENT_OTHER): Payer: Self-pay

## 2023-07-21 ENCOUNTER — Other Ambulatory Visit: Payer: Self-pay

## 2023-07-21 ENCOUNTER — Other Ambulatory Visit (HOSPITAL_BASED_OUTPATIENT_CLINIC_OR_DEPARTMENT_OTHER): Payer: Self-pay

## 2023-07-21 ENCOUNTER — Other Ambulatory Visit (HOSPITAL_COMMUNITY): Payer: Self-pay

## 2023-07-22 ENCOUNTER — Other Ambulatory Visit (HOSPITAL_COMMUNITY): Payer: Self-pay

## 2023-07-22 ENCOUNTER — Other Ambulatory Visit: Payer: Self-pay

## 2023-07-24 ENCOUNTER — Other Ambulatory Visit (HOSPITAL_COMMUNITY): Payer: Self-pay

## 2023-07-24 DIAGNOSIS — R350 Frequency of micturition: Secondary | ICD-10-CM | POA: Diagnosis not present

## 2023-07-24 DIAGNOSIS — N390 Urinary tract infection, site not specified: Secondary | ICD-10-CM | POA: Diagnosis not present

## 2023-07-24 MED ORDER — ESTRADIOL 0.1 MG/GM VA CREA
TOPICAL_CREAM | VAGINAL | 11 refills | Status: DC
  Filled 2023-07-24: qty 42.5, 90d supply, fill #0
  Filled 2023-10-19: qty 42.5, 90d supply, fill #1

## 2023-07-27 ENCOUNTER — Other Ambulatory Visit (HOSPITAL_COMMUNITY): Payer: Self-pay

## 2023-08-12 ENCOUNTER — Other Ambulatory Visit: Payer: Self-pay

## 2023-08-12 ENCOUNTER — Other Ambulatory Visit (HOSPITAL_COMMUNITY): Payer: Self-pay

## 2023-08-12 ENCOUNTER — Other Ambulatory Visit: Payer: Self-pay | Admitting: Family Medicine

## 2023-08-12 MED ORDER — BUSPIRONE HCL 7.5 MG PO TABS
7.5000 mg | ORAL_TABLET | Freq: Two times a day (BID) | ORAL | 5 refills | Status: DC | PRN
  Filled 2023-08-12: qty 60, 30d supply, fill #0
  Filled 2023-09-11: qty 60, 30d supply, fill #1
  Filled 2023-10-10: qty 60, 30d supply, fill #2
  Filled 2023-11-13: qty 60, 30d supply, fill #3
  Filled 2023-12-08: qty 60, 30d supply, fill #4
  Filled 2023-12-30 – 2024-01-01 (×2): qty 60, 30d supply, fill #5

## 2023-08-12 NOTE — Telephone Encounter (Signed)
 05/25/2023 LOV  07/19/2023 fill date  15/0 refills

## 2023-08-13 ENCOUNTER — Other Ambulatory Visit (HOSPITAL_BASED_OUTPATIENT_CLINIC_OR_DEPARTMENT_OTHER): Payer: Self-pay

## 2023-08-13 ENCOUNTER — Other Ambulatory Visit: Payer: Self-pay | Admitting: Family Medicine

## 2023-08-13 ENCOUNTER — Other Ambulatory Visit (HOSPITAL_COMMUNITY): Payer: Self-pay

## 2023-08-13 NOTE — Telephone Encounter (Signed)
 05/25/2023  07/19/2023  15/0 refills

## 2023-08-14 ENCOUNTER — Other Ambulatory Visit (HOSPITAL_COMMUNITY): Payer: Self-pay

## 2023-08-18 NOTE — Telephone Encounter (Signed)
 05/25/2023 LOV  07/19/2023 fill date  15/0 refills

## 2023-08-19 ENCOUNTER — Encounter: Payer: Self-pay | Admitting: Family Medicine

## 2023-08-20 NOTE — Telephone Encounter (Signed)
 Noted

## 2023-08-24 ENCOUNTER — Other Ambulatory Visit (HOSPITAL_COMMUNITY): Payer: Self-pay

## 2023-08-27 ENCOUNTER — Other Ambulatory Visit (HOSPITAL_COMMUNITY): Payer: Self-pay

## 2023-08-28 ENCOUNTER — Other Ambulatory Visit (HOSPITAL_COMMUNITY): Payer: Self-pay

## 2023-08-28 ENCOUNTER — Encounter (HOSPITAL_COMMUNITY): Payer: Self-pay | Admitting: Pharmacist

## 2023-09-01 ENCOUNTER — Encounter: Payer: Self-pay | Admitting: Family Medicine

## 2023-09-02 ENCOUNTER — Ambulatory Visit: Payer: Self-pay

## 2023-09-02 NOTE — Telephone Encounter (Addendum)
 2nd attempt, lvmtcb  1st attempt. lvmtcb  Copied from CRM #[id number]. Topic: Clinical - Medication Question >> Sep 02, 2023  8:32 AM Viola F wrote: Reason for CRM: Patient was told via MyChart that she needs an appt to come in for UTI - she says she cannot come in due to work and would like an antibiotic called in for the UTI like Dr. Jodie would normally do. She said her urine is cloudy and wouldn't specify any other symptoms, just says she has symptoms of UTI

## 2023-09-02 NOTE — Telephone Encounter (Signed)
 FYI Only or Action Required?: Action required by provider: requesting antibiotics .  Patient was last seen in primary care on 05/25/2023 by Jodie Lavern CROME, MD.  Called Nurse Triage reporting Urinary sx, abx request.  Symptoms began a week ago.  Interventions attempted: Rest, hydration, or home remedies.  Symptoms are: gradually worsening.  Triage Disposition: See Physician Within 24 Hours  Patient/caregiver understands and will follow disposition?: No, wishes to speak with PCP                Reason for Disposition  All other patients with painful urination  (Exception: [1] EITHER frequency or urgency AND [2] has on-call doctor.)  Answer Assessment - Initial Assessment Questions Refused appt as recommended. Reports she has to work and can not take time from work. Reports hx of UTIs and needs antibiotics. Patient reports she has pyridium at home and will take for pressure pain. Patient requesting a call back today and requesting medication to be picked up and not sent via delivery.  Denies fever, no blood in urine, no back or flank pain.       1. SEVERITY: How bad is the pain?  (e.g., Scale 1-10; mild, moderate, or severe)     Worsening sx  2. FREQUENCY: How many times have you had painful urination today?      Na  3. PATTERN: Is pain present every time you urinate or just sometimes?      Na  4. ONSET: When did the painful urination start?      Last week 5. FEVER: Do you have a fever? If Yes, ask: What is your temperature, how was it measured, and when did it start?     No  6. PAST UTI: Have you had a urine infection before? If Yes, ask: When was the last time? and What happened that time?      Yes patient reports staff should look in her chart 7. CAUSE: What do you think is causing the painful urination?  (e.g., UTI, scratch, Herpes sore)     UTI 8. OTHER SYMPTOMS: Do you have any other symptoms? (e.g., blood in urine, flank pain, genital  sores, urgency, vaginal discharge)     Urinary frequency, burning, pressure cloudy urine 9. PREGNANCY: Is there any chance you are pregnant? When was your last menstrual period?     Na  Protocols used: Urination Pain - Female-A-AH

## 2023-09-03 ENCOUNTER — Other Ambulatory Visit (HOSPITAL_COMMUNITY): Payer: Self-pay

## 2023-09-03 ENCOUNTER — Telehealth: Admitting: Physician Assistant

## 2023-09-03 DIAGNOSIS — R3989 Other symptoms and signs involving the genitourinary system: Secondary | ICD-10-CM

## 2023-09-03 MED ORDER — SULFAMETHOXAZOLE-TRIMETHOPRIM 800-160 MG PO TABS
1.0000 | ORAL_TABLET | Freq: Two times a day (BID) | ORAL | 0 refills | Status: DC
  Filled 2023-09-03: qty 10, 5d supply, fill #0

## 2023-09-03 NOTE — Patient Instructions (Signed)
 Denise Rice, thank you for joining [name], PA-C for today's virtual visit.  While this provider is not your primary care provider (PCP), if your PCP is located in our provider database this encounter information will be shared with them immediately following your visit.   A [facility] MyChart account gives you access to today's visit and all your visits, tests, and labs performed at [facility]  click here if you don't have a [facility] MyChart account or go to [email or web address]  Consent: (Patient) Denise Rice provided verbal consent for this virtual visit at the beginning of the encounter.  Current Medications:  Current Outpatient Medications:    acetaminophen  (TYLENOL ) 325 MG tablet, Take 325 mg by mouth daily as needed for headache., Disp: , Rfl:    amLODipine  (NORVASC ) 5 MG tablet, Take 0.5 tablets (2.5 mg total) by mouth daily. Pt stated that she is taking 2 to help keep bp down, Disp: 180 tablet, Rfl: 2   Biotin 10 MG CAPS, Take by mouth., Disp: , Rfl:    busPIRone  (BUSPAR ) 7.5 MG tablet, Take 1 tablet (7.5 mg total) by mouth 2 (two) times daily as needed (anxiety)., Disp: 60 tablet, Rfl: 5   Cholecalciferol (VITAMIN D ) 50 MCG (2000 UT) CAPS, Take 2,000 Units by mouth daily., Disp: , Rfl:    Cod Liver Oil CAPS, Take 1 capsule by mouth daily., Disp: , Rfl:    cyanocobalamin  (VITAMIN B12) 1000 MCG/ML injection, Inject 1 mL (1,000 mcg total) into the muscle every 30 (thirty) days., Disp: 10 mL, Rfl: 2   diazepam  (VALIUM ) 10 MG tablet, Take 1 tablet (10 mg total) by mouth daily as needed for anxiety, Disp: 30 tablet, Rfl: 5   dicyclomine  (BENTYL ) 20 MG tablet, Take 1 tablet (20 mg total) by mouth 2 (two) times daily as needed for spasms (abdominal pain)., Disp: 20 tablet, Rfl: 0   estradiol  (ESTRACE  VAGINAL) 0.1 MG/GM vaginal cream, Apply a finger length tip amount around the urethra nightly for 2 weeks.  Then apply twice per week, Disp: 45 g, Rfl: 11    ferrous sulfate 325 (65 FE) MG tablet, Take 325 mg by mouth daily with breakfast., Disp: , Rfl:    mupirocin  ointment (BACTROBAN ) 2 %, Apply to affected areas topically 2 (two) times daily., Disp: 22 g, Rfl: 0   ondansetron  (ZOFRAN ) 4 MG tablet, Take 1 tablet (4 mg total) by mouth every 8 (eight) hours as needed for nausea or vomiting., Disp: 20 tablet, Rfl: 0   Potassium (POTASSIMIN PO), Take 1 tablet by mouth daily., Disp: , Rfl:    sulfamethoxazole -trimethoprim  (BACTRIM  DS) 800-160 MG tablet, Take 1 tablet by mouth 2 (two) times daily., Disp: 10 tablet, Rfl: 0   Syringe/Needle, Disp, (SYRINGE 3CC/25GX1) 25G X 1 3 ML MISC, Use once monthly for injection of Vitamin B-12, Disp: 50 each, Rfl: 0   traMADol  (ULTRAM ) 50 MG tablet, Take 1 tablet (50 mg total) by mouth every 6 (six) hours as needed for moderate pain (pain score 4-6)., Disp: 30 tablet, Rfl: 0   traZODone  (DESYREL ) 100 MG tablet, Take 1 - 1.5 tablets (100-150 mg total) by mouth at bedtime as needed for sleep., Disp: 45 tablet, Rfl: 3   triamcinolone  cream (KENALOG ) 0.1 %, Apply 1 Application topically 2 (two) times daily for 2 weeks. Then as needed., Disp: 45 g, Rfl: 0   Medications ordered in this encounter:  No orders of the defined types were placed in this encounter.    *  If you need refills on other medications prior to your next appointment, please contact your pharmacy*  Follow-Up: Call back or seek an in-person evaluation if the symptoms worsen or if the condition fails to improve as anticipated.  [facility] [phone]  Other Instructions Your symptoms are consistent with a bladder infection, also called acute cystitis. Please take your antibiotic (Bactrim ) as directed until all pills are gone.  Stay very well hydrated.  Consider a daily probiotic (Align, Culturelle, or Activia) to help prevent stomach upset caused by the antibiotic.  Taking a probiotic daily may also help prevent recurrent UTIs.  Also  consider taking AZO (Phenazopyridine) tablets to help decrease pain with urination.  If you note any non-resolving, new, or worsening symptoms despite treatment, please seek an in-person evaluation ASAP.    Urinary Tract Infection A urinary tract infection (UTI) can occur any place along the urinary tract. The tract includes the kidneys, ureters, bladder, and urethra. A type of germ called bacteria often causes a UTI. UTIs are often helped with antibiotic medicine.  HOME CARE  If given, take antibiotics as told by your doctor. Finish them even if you start to feel better. Drink enough fluids to keep your pee (urine) clear or pale yellow. Avoid tea, drinks with caffeine, and bubbly (carbonated) drinks. Pee often. Avoid holding your pee in for a long time. Pee before and after having sex (intercourse). Wipe from front to back after you poop (bowel movement) if you are a woman. Use each tissue only once. GET HELP RIGHT AWAY IF:  You have back pain. You have lower belly (abdominal) pain. You have chills. You feel sick to your stomach (nauseous). You throw up (vomit). Your burning or discomfort with peeing does not go away. You have a fever. Your symptoms are not better in 3 days. MAKE SURE YOU:  Understand these instructions. Will watch your condition. Will get help right away if you are not doing well or get worse. Document Released: 06/18/2007 Document Revised: 09/24/2011 Document Reviewed: 07/31/2011 [facility] Patient Information 2015 [place], [place]. This information is not intended to replace advice given to you by your health care provider. Make sure you discuss any questions you have with your health care provider.    If you have been instructed to have an in-person evaluation today at a local Urgent Care facility, please use the link below. It will take you to a list of all of our available [facility], including address, phone number and hours of operation. Please do  not delay care.  [facility]  If you or a family member do not have a primary care provider, use the link below to schedule a visit and establish care. When you choose a [facility] primary care physician or advanced practice provider, you gain a long-term partner in health. Find a Primary Care Provider  Learn more about [facility] in-office and virtual care options: [facility] - Get Care Now

## 2023-09-03 NOTE — Telephone Encounter (Signed)
 Noted

## 2023-09-03 NOTE — Progress Notes (Signed)
 Virtual Visit Consent   Denise Rice, you are scheduled for a virtual visit with a [facility] provider today. Just as with appointments in the office, your consent must be obtained to participate. Your consent will be active for this visit and any virtual visit you may have with one of our providers in the next 365 days. If you have a MyChart account, a copy of this consent can be sent to you electronically.  As this is a virtual visit, video technology does not allow for your provider to perform a traditional examination. This may limit your provider's ability to fully assess your condition. If your provider identifies any concerns that need to be evaluated in person or the need to arrange testing (such as labs, EKG, etc.), we will make arrangements to do so. Although advances in technology are sophisticated, we cannot ensure that it will always work on either your end or our end. If the connection with a video visit is poor, the visit may have to be switched to a telephone visit. With either a video or telephone visit, we are not always able to ensure that we have a secure connection.  By engaging in this virtual visit, you consent to the provision of healthcare and authorize for your insurance to be billed (if applicable) for the services provided during this visit. Depending on your insurance coverage, you may receive a charge related to this service.  I need to obtain your verbal consent now. Are you willing to proceed with your visit today? Denise Rice has provided verbal consent on 09/03/2023 for a virtual visit (video or telephone). Denise Rice, [place]  Date: 09/03/2023 12:23 PM   Virtual Visit via Video Note   I, Denise Rice, connected with  Denise Rice  ([id number], Sep 30, 1950) on 09/03/23 at 12:45 PM EDT by a video-enabled telemedicine application and verified that I am speaking with the correct person using two identifiers.  Location: Patient: Virtual Visit Location  Patient: Home Provider: Virtual Visit Location Provider: Home Office   I discussed the limitations of evaluation and management by telemedicine and the availability of in person appointments. The patient expressed understanding and agreed to proceed.    History of Present Illness: Denise Rice is a 73 y.o. who identifies as a female who was assigned female at birth, and is being seen today for possible UTI. Endorses symptoms starting within the past week. Notes long history of UTI, previously followed by Urology. Endorses urgency, frequency, mild dysuria, suprapubic pressure. Denies fever, chills, nausea or vomiting. Is on Estradiol  from her regular Urologist to help prevent this.    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 Pt stated that she is taking 2 to help keep bp down, Disp: 180  tablet, Rfl: 2   Biotin 10 MG CAPS, Take by mouth., Disp: , Rfl:    busPIRone  (BUSPAR ) 7.5 MG tablet, Take 1 tablet (7.5 mg total) by mouth 2 (two) times daily as needed (anxiety)., Disp: 60 tablet, Rfl: 5   Cholecalciferol (VITAMIN D ) 50 MCG (2000 UT) CAPS, Take 2,000 Units by mouth daily., Disp: , Rfl:    Cod Liver Oil CAPS, Take 1 capsule by mouth daily., Disp: , Rfl:    cyanocobalamin  (VITAMIN B12) 1000 MCG/ML injection, Inject 1 mL (1,000 mcg total) into the muscle every 30 (thirty) days., Disp: 10 mL, Rfl: 2   diazepam  (VALIUM ) 10 MG tablet, Take 1 tablet (10 mg total) by mouth daily as needed for anxiety, Disp: 30 tablet, Rfl: 5   dicyclomine  (BENTYL ) 20 MG tablet, Take 1 tablet (20 mg total) by mouth 2 (two) times daily as needed for spasms (abdominal pain)., Disp: 20 tablet, Rfl: 0   estradiol  (ESTRACE  VAGINAL) 0.1 MG/GM vaginal cream, Apply a finger length tip amount around the urethra nightly for 2 weeks.  Then apply twice per week, Disp: 45 g, Rfl: 11   ferrous sulfate 325 (65 FE) MG tablet, Take 325 mg by mouth daily with breakfast., Disp: , Rfl:    mupirocin  ointment (BACTROBAN ) 2 %, Apply to affected areas topically 2 (two) times daily., Disp: 22 g, Rfl: 0   ondansetron  (ZOFRAN ) 4 MG tablet, Take 1 tablet (4 mg total) by mouth every 8 (eight) hours as needed for nausea or vomiting., Disp: 20 tablet, Rfl: 0   Potassium (POTASSIMIN PO), Take 1 tablet by mouth daily., Disp: , Rfl:    sulfamethoxazole -trimethoprim  (BACTRIM  DS) 800-160 MG tablet, Take 1 tablet by mouth 2 (two) times daily., Disp: 10 tablet, Rfl: 0   Syringe/Needle, Disp, (SYRINGE 3CC/25GX1) 25G X 1 3 ML MISC, Use once monthly for injection of Vitamin B-12, Disp: 50 each, Rfl: 0   traZODone  (DESYREL ) 100 MG tablet, Take 1 - 1.5 tablets (100-150 mg total) by mouth at bedtime as needed for sleep., Disp: 45 tablet, Rfl: 3  Observations/Objective: Patient is well-developed, well-nourished in no acute distress.  Resting  comfortably at home.  Head is normocephalic, atraumatic.  No labored breathing.  Speech is clear and coherent with logical content.  Patient is alert and oriented at baseline.   Assessment and Plan: 1. Suspected UTI (Primary) - sulfamethoxazole -trimethoprim  (BACTRIM  DS) 800-160 MG tablet; Take 1 tablet by mouth 2 (two) times daily.  Dispense: 10 tablet; Refill: 0  Classic UTI symptoms with absence of alarm signs or symptoms. Prior history of UTI. Will treat empirically with Bactrim  for suspected uncomplicated cystitis as her PCP is out of office and cannot get in with her Urologist this week. Supportive measures and OTC medications reviewed. Strict in-person evaluation precautions discussed.    Follow Up Instructions: I discussed the assessment and treatment plan with the patient. The patient was provided an opportunity to ask questions and all were answered. The patient agreed with the plan and demonstrated an understanding of the instructions.  A copy of instructions were sent to the patient via MyChart unless otherwise noted below.  The patient was advised to call back or seek an in-person evaluation if the symptoms worsen or if the condition fails to improve as anticipated.    Denise Velma Lunger, PA-C

## 2023-09-04 ENCOUNTER — Telehealth: Payer: Self-pay

## 2023-09-11 ENCOUNTER — Other Ambulatory Visit: Payer: Self-pay | Admitting: Family Medicine

## 2023-09-11 ENCOUNTER — Other Ambulatory Visit (HOSPITAL_COMMUNITY): Payer: Self-pay

## 2023-09-15 ENCOUNTER — Ambulatory Visit: Payer: Self-pay | Admitting: Family Medicine

## 2023-09-15 ENCOUNTER — Other Ambulatory Visit (HOSPITAL_BASED_OUTPATIENT_CLINIC_OR_DEPARTMENT_OTHER): Payer: Self-pay

## 2023-09-15 ENCOUNTER — Telehealth (INDEPENDENT_AMBULATORY_CARE_PROVIDER_SITE_OTHER): Admitting: Family Medicine

## 2023-09-15 ENCOUNTER — Encounter: Payer: Self-pay | Admitting: Family Medicine

## 2023-09-15 ENCOUNTER — Other Ambulatory Visit: Payer: Self-pay

## 2023-09-15 ENCOUNTER — Other Ambulatory Visit (HOSPITAL_COMMUNITY): Payer: Self-pay

## 2023-09-15 VITALS — Ht 60.0 in | Wt 120.0 lb

## 2023-09-15 DIAGNOSIS — N390 Urinary tract infection, site not specified: Secondary | ICD-10-CM

## 2023-09-15 LAB — URINALYSIS, ROUTINE W REFLEX MICROSCOPIC
Nitrite: POSITIVE — AB
Specific Gravity, Urine: >=1.03 — AB (ref 1.000–1.030)
Total Protein, Urine: 30 — AB
Urine Glucose: 100 — AB
Urobilinogen, UA: 1 (ref 0.0–1.0)
pH: 5.5 (ref 5.0–8.0)

## 2023-09-15 MED ORDER — DIAZEPAM 10 MG PO TABS
10.0000 mg | ORAL_TABLET | Freq: Every day | ORAL | 5 refills | Status: AC
  Filled 2023-09-15 – 2023-09-16 (×3): qty 30, 30d supply, fill #0
  Filled 2023-10-10 – 2023-10-16 (×3): qty 30, 30d supply, fill #1
  Filled 2023-11-13: qty 30, 30d supply, fill #2
  Filled 2023-12-08 – 2023-12-11 (×2): qty 30, 30d supply, fill #3
  Filled 2023-12-30 – 2024-01-01 (×2): qty 30, 30d supply, fill #4
  Filled 2024-01-31: qty 30, 30d supply, fill #5

## 2023-09-15 MED ORDER — NITROFURANTOIN MONOHYD MACRO 100 MG PO CAPS
100.0000 mg | ORAL_CAPSULE | Freq: Two times a day (BID) | ORAL | 0 refills | Status: DC
Start: 2023-09-15 — End: 2023-11-11
  Filled 2023-09-15 (×2): qty 10, 5d supply, fill #0

## 2023-09-15 MED ORDER — TRAZODONE HCL 100 MG PO TABS
100.0000 mg | ORAL_TABLET | Freq: Every evening | ORAL | 3 refills | Status: AC | PRN
Start: 2023-09-15 — End: 2024-09-14
  Filled 2023-09-15 – 2023-09-16 (×2): qty 45, 30d supply, fill #0
  Filled 2023-10-10: qty 45, 30d supply, fill #1
  Filled 2023-11-13: qty 45, 30d supply, fill #2
  Filled 2023-12-08: qty 45, 30d supply, fill #3

## 2023-09-15 NOTE — Telephone Encounter (Signed)
 05/25/2023 LOV  04/01/2023 Fill date  30/0 refills

## 2023-09-15 NOTE — Progress Notes (Signed)
 See mychart note Dear Ms. Carole, Thanks Rock. Urine looks infected again as you are aware. We will await the culture so we are certain we have the right antibiotic.  I have sent in macrobid  to start today. Sincerely, Dr. Jodie

## 2023-09-15 NOTE — Progress Notes (Signed)
 Virtual Visit via Video Note  Subjective  CC:  Chief Complaint  Patient presents with   Urinary Frequency    Pt state that she had finished the 5 day course of bactrim  and she was fine for 2 days and then it came back. Frequent urination, burning, itching and cloudy urine      I connected with Rock DELENA Perks on 09/15/23 at  8:30 AM EDT by a video enabled telemedicine application and verified that I am speaking with the correct person using two identifiers. Location patient: Home Location provider: [facility] [address] Office Persons participating in the virtual visit: Denise Rice, Lavern LITTIE Heck, MD Avelina Berber CMA  I discussed the limitations of evaluation and management by telemedicine and the availability of in person appointments. The patient expressed understanding and agreed to proceed. HPI: Denise Rice is a 73 y.o. female who was contacted today to address the problems listed above in the chief complaint. 73 year old with history of recurrent UTI who reports she saw urology about 6 weeks ago.  Treated with estradiol  cream.  Since, had classic UTI symptoms treated by ED visit recently, prescribed Bactrim .  Symptoms resolved but now her back in full force.  Cloudy urine, urinary frequency, dysuria, suprapubic pressure and poor bladder emptying.  She denies fevers chills or flank pain.  No vaginal discharge or vaginal itching.  She is using the estradiol  cream as recommended.  Assessment  1. Recurrent UTI      Plan  Recurrent UTI, presumed: Patient to come in this afternoon and leave a urine sample for UA with micro and culture.  Once that is done she can start Macrobid  twice daily.  I also recommend her making a follow-up appointment with urology in the next 6 to 12 weeks.  I recommend bringing in a bladder symptom log and urinary tract infection log.  If estradiol  cream is not effective in helping decrease frequency of urinary tract infections, can  consider prophylactic antibiotics.  Continue to flush bladder with water.  She will follow-up with me in the office if symptoms are not improving. I discussed the assessment and treatment plan with the patient. The patient was provided an opportunity to ask questions and all were answered. The patient agreed with the plan and demonstrated an understanding of the instructions.   The patient was advised to call back or seek an in-person evaluation if the symptoms worsen or if the condition fails to improve as anticipated. Follow up: As scheduled 11/11/2023  Meds ordered this encounter  Medications   nitrofurantoin , macrocrystal-monohydrate, (MACROBID ) 100 MG capsule    Sig: Take 1 capsule (100 mg total) by mouth 2 (two) times daily.    Dispense:  10 capsule    Refill:  0      I reviewed the patients updated PMH, FH, and SocHx.    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    Priority: Medium    IFG (impaired fasting glucose) 11/16/2020    Priority: Medium    History of migraine 05/04/2019    Priority: Medium    History of small bowel obstruction 11/17/2017    Priority: Medium    Chronic vertigo 03/11/2017    Priority: Medium    Major depression, chronic 03/11/2017  Priority: Medium    PCOS (polycystic ovarian syndrome) 03/11/2017    Priority: Medium    Insomnia 09/08/2015    Priority: Medium    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   Biotin 10 MG CAPS Take by mouth.   busPIRone  (BUSPAR ) 7.5 MG tablet Take 1 tablet (7.5 mg total) by mouth 2 (two) times daily as needed (anxiety).   Cholecalciferol (VITAMIN D ) 50 MCG (2000 UT) CAPS Take 2,000 Units by mouth daily.   Cod Liver Oil CAPS Take 1 capsule by mouth daily.   cyanocobalamin  (VITAMIN B12) 1000 MCG/ML injection Inject 1 mL (1,000 mcg total) into the muscle every 30 (thirty) days.   diazepam  (VALIUM ) 10 MG tablet Take 1 tablet (10 mg total) by mouth daily as needed for anxiety   dicyclomine  (BENTYL ) 20 MG tablet Take 1 tablet (20 mg total) by mouth 2 (two) times daily as needed for spasms (abdominal pain).   estradiol  (ESTRACE  VAGINAL) 0.1 MG/GM vaginal cream Apply a finger length tip amount around the urethra nightly for 2 weeks.  Then apply twice per week   ferrous sulfate 325 (65 FE) MG tablet Take 325 mg by mouth daily with breakfast.   mupirocin  ointment (BACTROBAN ) 2 % Apply to affected areas topically 2 (two) times daily.   nitrofurantoin , macrocrystal-monohydrate, (MACROBID ) 100 MG capsule Take 1 capsule (100 mg total) by mouth 2 (two) times daily.   ondansetron  (ZOFRAN ) 4 MG tablet Take 1 tablet (4 mg total) by mouth every 8 (eight) hours as needed for nausea or vomiting.   Potassium (POTASSIMIN PO) Take 1 tablet by mouth daily.   Syringe/Needle, Disp, (SYRINGE 3CC/25GX1) 25G X 1 3 ML MISC Use once monthly for injection of Vitamin B-12   traZODone  (DESYREL ) 100 MG tablet Take 1 - 1.5 tablets (100-150 mg total) by mouth at bedtime as needed for sleep.    Allergies: Patient is allergic to lactose, topiramate , and alprazolam . Family History: Patient family history includes Anxiety disorder in her mother; Cancer in her paternal aunt and paternal uncle; Cancer - Prostate in her father; Diabetes in her father, maternal aunt,  mother, and sister; Heart attack in her mother; Heart disease in her father, maternal aunt, and mother; High blood pressure in her sister; Hyperlipidemia in her father and mother; Hypertension in her father and mother; Kidney disease in her father and mother; Obesity in her father and mother; Stroke in her father, mother, and sister; Thyroid  disease in her mother. Social History:  Patient  reports that she has never smoked. She has never used smokeless tobacco. She reports that she does not drink alcohol and does not use drugs.  Review of Systems: Constitutional: Negative for fever malaise or anorexia Cardiovascular: negative for chest pain Respiratory: negative for SOB or persistent cough Gastrointestinal: negative for abdominal pain  OBJECTIVE Vitals: Ht 5' (1.524 m)   Wt 120 lb (54.4 kg)   LMP 01/13/2005   BMI 23.44 kg/m  General: no acute distress , A&Ox3, appears well  Lavern LITTIE Heck, MD

## 2023-09-16 ENCOUNTER — Other Ambulatory Visit: Payer: Self-pay

## 2023-09-16 ENCOUNTER — Other Ambulatory Visit (HOSPITAL_COMMUNITY): Payer: Self-pay

## 2023-09-16 LAB — URINE CULTURE
MICRO NUMBER:: 16910221
SPECIMEN QUALITY:: ADEQUATE

## 2023-09-17 ENCOUNTER — Emergency Department (HOSPITAL_COMMUNITY)
Admission: EM | Admit: 2023-09-17 | Discharge: 2023-09-17 | Disposition: A | Discharge status: Home / Self Care | Attending: Emergency Medicine | Admitting: Emergency Medicine

## 2023-09-17 ENCOUNTER — Encounter (HOSPITAL_COMMUNITY): Payer: Self-pay

## 2023-09-17 ENCOUNTER — Other Ambulatory Visit: Payer: Self-pay

## 2023-09-17 ENCOUNTER — Emergency Department (HOSPITAL_COMMUNITY)

## 2023-09-17 ENCOUNTER — Ambulatory Visit: Payer: Self-pay

## 2023-09-17 DIAGNOSIS — R1084 Generalized abdominal pain: Secondary | ICD-10-CM | POA: Insufficient documentation

## 2023-09-17 DIAGNOSIS — I10 Essential (primary) hypertension: Secondary | ICD-10-CM | POA: Diagnosis not present

## 2023-09-17 DIAGNOSIS — Z79899 Other long term (current) drug therapy: Secondary | ICD-10-CM | POA: Diagnosis not present

## 2023-09-17 DIAGNOSIS — R112 Nausea with vomiting, unspecified: Secondary | ICD-10-CM | POA: Insufficient documentation

## 2023-09-17 DIAGNOSIS — R1111 Vomiting without nausea: Secondary | ICD-10-CM | POA: Diagnosis not present

## 2023-09-17 LAB — COMPREHENSIVE METABOLIC PANEL WITH GFR
ALT: 23 U/L (ref 0–44)
AST: 31 U/L (ref 15–41)
Albumin: 4.1 g/dL (ref 3.5–5.0)
Alkaline Phosphatase: 73 U/L (ref 38–126)
Anion gap: 12 (ref 5–15)
BUN: 16 mg/dL (ref 8–23)
CO2: 25 mmol/L (ref 22–32)
Calcium: 8.5 mg/dL — ABNORMAL LOW (ref 8.9–10.3)
Chloride: 105 mmol/L (ref 98–111)
Creatinine, Ser: 0.79 mg/dL (ref 0.44–1.00)
GFR, Estimated: >60 mL/min (ref >60)
Glucose, Bld: 48 mg/dL — ABNORMAL LOW (ref 70–99)
Potassium: 3.8 mmol/L (ref 3.5–5.1)
Sodium: 141 mmol/L (ref 135–145)
Total Bilirubin: 0.3 mg/dL (ref 0.0–1.2)
Total Protein: 6.6 g/dL (ref 6.5–8.1)

## 2023-09-17 LAB — CBC
HCT: 35.6 % — ABNORMAL LOW (ref 36.0–46.0)
Hemoglobin: 12 g/dL (ref 12.0–15.0)
MCH: 27.6 pg (ref 26.0–34.0)
MCHC: 33.7 g/dL (ref 30.0–36.0)
MCV: 82 fL (ref 80.0–100.0)
Platelets: 206 K/uL (ref 150–400)
RBC: 4.34 MIL/uL (ref 3.87–5.11)
RDW: 12.8 % (ref 11.5–15.5)
WBC: 5.9 K/uL (ref 4.0–10.5)
nRBC: 0 % (ref 0.0–0.2)

## 2023-09-17 LAB — URINALYSIS, ROUTINE W REFLEX MICROSCOPIC
Bacteria, UA: NONE SEEN
Bilirubin Urine: NEGATIVE
Glucose, UA: 50 mg/dL — AB
Hgb urine dipstick: NEGATIVE
Ketones, ur: NEGATIVE mg/dL
Nitrite: NEGATIVE
Protein, ur: NEGATIVE mg/dL
Specific Gravity, Urine: 1.025 (ref 1.005–1.030)
pH: 5 (ref 5.0–8.0)

## 2023-09-17 LAB — LACTIC ACID, PLASMA
Lactic Acid, Venous: 1.4 mmol/L (ref 0.5–1.9)
Lactic Acid, Venous: 1.2 mmol/L (ref 0.5–1.9)

## 2023-09-17 LAB — CBG MONITORING, ED
Glucose-Capillary: 105 mg/dL — ABNORMAL HIGH (ref 70–99)
Glucose-Capillary: 103 mg/dL — ABNORMAL HIGH (ref 70–99)

## 2023-09-17 LAB — LIPASE, BLOOD: Lipase: 27 U/L (ref 11–51)

## 2023-09-17 MED ORDER — FENTANYL CITRATE PF 50 MCG/ML IJ SOSY
50.0000 ug | PREFILLED_SYRINGE | Freq: Once | INTRAMUSCULAR | Status: AC
  Administered 2023-09-17: 50 ug via INTRAVENOUS
  Filled 2023-09-17: qty 1

## 2023-09-17 MED ORDER — LACTATED RINGERS IV BOLUS
1000.0000 mL | Freq: Once | INTRAVENOUS | Status: AC
  Administered 2023-09-17: 1000 mL via INTRAVENOUS

## 2023-09-17 MED ORDER — DEXTROSE 50 % IV SOLN
1.0000 | Freq: Once | INTRAVENOUS | Status: AC
  Administered 2023-09-17: 50 mL via INTRAVENOUS
  Filled 2023-09-17: qty 50

## 2023-09-17 MED ORDER — ONDANSETRON HCL 4 MG/2ML IJ SOLN: 4.0000 mg | Freq: Four times a day (QID) | INTRAMUSCULAR | Status: DC | PRN

## 2023-09-17 MED ORDER — IOHEXOL 300 MG/ML  SOLN
100.0000 mL | Freq: Once | INTRAMUSCULAR | Status: AC | PRN
  Administered 2023-09-17: 100 mL via INTRAVENOUS

## 2023-09-17 MED ORDER — MORPHINE SULFATE (PF) 2 MG/ML IV SOLN
2.0000 mg | Freq: Once | INTRAVENOUS | Status: AC
  Administered 2023-09-17: 2 mg via INTRAVENOUS
  Filled 2023-09-17: qty 1

## 2023-09-17 MED ORDER — ONDANSETRON HCL 4 MG/2ML IJ SOLN
4.0000 mg | Freq: Once | INTRAMUSCULAR | Status: AC
  Administered 2023-09-17: 4 mg via INTRAVENOUS
  Filled 2023-09-17: qty 2

## 2023-09-17 MED ORDER — HALOPERIDOL LACTATE 5 MG/ML IJ SOLN
2.0000 mg | Freq: Once | INTRAMUSCULAR | Status: AC
  Administered 2023-09-17: 2 mg via INTRAVENOUS
  Filled 2023-09-17: qty 1

## 2023-09-17 NOTE — ED Triage Notes (Signed)
 BIBA from home for abdominal pain radiates to back, n/v since 1400, currently on abx for a UTI.

## 2023-09-17 NOTE — ED Provider Notes (Signed)
 [place] [facility] AT [facility] Provider Note   CSN: [id number] Arrival date & time: 09/17/23  1708     History Chief Complaint  Patient presents with   Abdominal Pain   Back Pain    HPI: Denise Rice is a 73 y.o. female with history pertinent prior gastric bypass with prior SBO, sensorineural hearing loss, PCOS, osteoporosis who presents complaining of abdominal pain. Patient arrived accompanied by husband, Ozell.  History provided by patient.  No interpreter required during this encounter.  Patient reports that she had dysuria, urgency, frequency last week, reports that she did a telehealth visit, and was prescribed a course of Bactrim  (8/21).  Reports that she completed a course of this, and symptoms resolved, however 2 days after finishing antibiotics her symptoms came back.  Reports that she saw her PCP and was prescribed a course of Macrobid .  Reports that she went to take her Macrobid  today at 1400 hrs., and tried to eat some food as to not take them on an empty stomach when she developed severe abdominal pain in a band across her mid abdomen, as well as nausea and vomiting.  Reports that she had approximately 5 total episodes of vomiting, and the later episodes were blood-tinged.  Reports that she has persistent severe pain that she has stopped vomiting.  Reports that she has had prior pancreatitis and prior bowel obstructions, and that this feels similar to her prior bowel obstructions.  Reports that her abdomen feels bloated.  Reports that her most recent prior bowel movement was this morning prior to any of the symptoms, denies fever, chills, chest pain, shortness of breath.  Patient's recorded medical, surgical, social, medication list and allergies were reviewed in the Snapshot window as part of the initial history.   Prior to Admission medications   Medication Sig Start Date End Date Taking? Authorizing Provider  acetaminophen  (TYLENOL ) 325 MG tablet  Take 325 mg by mouth daily as needed for headache.    [provider]  amLODipine  (NORVASC ) 5 MG tablet Take 0.5 tablets (2.5 mg total) by mouth daily. Pt stated that she is taking 2 to help keep bp down 05/25/23   Jodie Lavern CROME, MD  Biotin 10 MG CAPS Take by mouth.    [provider]  busPIRone  (BUSPAR ) 7.5 MG tablet Take 1 tablet (7.5 mg total) by mouth 2 (two) times daily as needed (anxiety). 08/12/23   Jodie Lavern CROME, MD  Cholecalciferol (VITAMIN D ) 50 MCG (2000 UT) CAPS Take 2,000 Units by mouth daily.    [provider]  [facility] Liver Oil CAPS Take 1 capsule by mouth daily.    [provider]  cyanocobalamin  (VITAMIN B12) 1000 MCG/ML injection Inject 1 mL (1,000 mcg total) into the muscle every 30 (thirty) days. 10/21/22   Jodie Lavern CROME, MD  diazepam  (VALIUM ) 10 MG tablet Take 1 tablet (10 mg total) by mouth daily as needed for anxiety 09/15/23   Jodie Lavern CROME, MD  dicyclomine  (BENTYL ) 20 MG tablet Take 1 tablet (20 mg total) by mouth 2 (two) times daily as needed for spasms (abdominal pain). 06/02/23   Jodie Lavern CROME, MD  estradiol  (ESTRACE  VAGINAL) 0.1 MG/GM vaginal cream Apply a finger length tip amount around the urethra nightly for 2 weeks.  Then apply twice per week 07/24/23     ferrous sulfate 325 (65 FE) MG tablet Take 325 mg by mouth daily with breakfast.    [provider]  mupirocin  ointment (  BACTROBAN ) 2 % Apply to affected areas topically 2 (two) times daily. 09/29/22   Jodie Lavern CROME, MD  nitrofurantoin , macrocrystal-monohydrate, (MACROBID ) 100 MG capsule Take 1 capsule (100 mg total) by mouth 2 (two) times daily. 09/15/23   Jodie Lavern CROME, MD  ondansetron  (ZOFRAN ) 4 MG tablet Take 1 tablet (4 mg total) by mouth every 8 (eight) hours as needed for nausea or vomiting. 07/21/22   Allwardt, Alyssa M, PA-C  Potassium (POTASSIMIN PO) Take 1 tablet by mouth daily.    [provider]  sulfamethoxazole -trimethoprim  (BACTRIM  DS) 800-160 MG  tablet Take 1 tablet by mouth 2 (two) times daily. 09/03/23   Gladis Elsie BROCKS, PA-C  Syringe/Needle, Disp, (SYRINGE 3CC/25GX1) 25G X 1 3 ML MISC Use once monthly for injection of Vitamin B-12 06/28/21   Katrinka Garnette KIDD, MD  traZODone  (DESYREL ) 100 MG tablet Take 1 - 1.5 tablets (100-150 mg total) by mouth at bedtime as needed for sleep. 09/15/23 09/14/24  Jodie Lavern CROME, MD     Allergies: Lactose, Topiramate , and Alprazolam    Review of Systems   ROS as per HPI  Physical Exam Updated Vital Signs BP 104/71   Pulse 65   Temp 97.9 F (36.6 C) (Oral)   Resp 13   LMP 01/13/2005   SpO2 97%  Physical Exam Vitals and nursing note reviewed.  Constitutional:      General: She is not in acute distress.    Appearance: She is well-developed.  HENT:     Head: Normocephalic and atraumatic.  Eyes:     Conjunctiva/sclera: Conjunctivae normal.  Cardiovascular:     Rate and Rhythm: Normal rate and regular rhythm.     Heart sounds: No murmur heard. Pulmonary:     Effort: Pulmonary effort is normal. No respiratory distress.     Breath sounds: Normal breath sounds.  Abdominal:     Palpations: Abdomen is soft.     Tenderness: There is generalized abdominal tenderness. There is right CVA tenderness. There is no left CVA tenderness, guarding or rebound.  Musculoskeletal:        General: No swelling.     Cervical back: Neck supple.  Skin:    General: Skin is warm and dry.     Capillary Refill: Capillary refill takes less than 2 seconds.  Neurological:     Mental Status: She is alert.  Psychiatric:        Mood and Affect: Mood normal.     ED Course/ Medical Decision Making/ A&P    Procedures Procedures   Medications Ordered in ED Medications  ondansetron  (ZOFRAN ) injection 4 mg (has no administration in time range)  fentaNYL  (SUBLIMAZE ) injection 50 mcg (50 mcg Intravenous Given 09/17/23 1745)  ondansetron  (ZOFRAN ) injection 4 mg (4 mg Intravenous Given 09/17/23 1745)  lactated ringers   bolus 1,000 mL (0 mLs Intravenous Stopped 09/17/23 1859)  dextrose  50 % solution 50 mL (50 mLs Intravenous Given 09/17/23 1819)  iohexol  (OMNIPAQUE ) 300 MG/ML solution 100 mL (100 mLs Intravenous Contrast Given 09/17/23 1950)  morphine  (PF) 2 MG/ML injection 2 mg (2 mg Intravenous Given 09/17/23 2003)  haloperidol  lactate (HALDOL ) injection 2 mg (2 mg Intravenous Given 09/17/23 2049)    Medical Decision Making:   MURIAH HARSHA is a 73 y.o. female who presents for abdominal pain as per above.  Physical exam is pertinent for diffuse abdominal tenderness to palpation without guarding or rebound, mild right-sided CVA tenderness.   The differential includes but is not limited to bowel obstruction,  gastritis, pancreatitis, cholecystitis, pyelonephritis, nephrolithiasis, pyelonephritis, mesenteric ischemia.  Independent historian: None  External data reviewed: Notes: Reviewed prior notes, patient had telehealth visits on 8/21 as well as 9/2, was prescribed Bactrim  and Macrobid  after these visits, respectively  Labs: Ordered, Independent interpretation, and Details: CBC without leukocytosis, anemia, thrombocytopenia.  CMP without AKI, emergent electrolyte change, no emergent LFT abnormality, patient is hypoglycemic to 48.  Lactic acid WNL.  Lipase WNL.  Radiology: Ordered, Independent interpretation, Details: Personally viewed CT of abdomen pelvis, do appreciate multiple renal cysts.  I do not appreciate significant free fluid, free air, obstructive bowel gas pattern, bony abnormality, and All images reviewed independently.  Agree with radiology report at this time.   CT ABDOMEN PELVIS W CONTRAST Result Date: 09/17/2023 CLINICAL DATA:  Abdominal pain. EXAM: CT ABDOMEN AND PELVIS WITH CONTRAST TECHNIQUE: Multidetector CT imaging of the abdomen and pelvis was performed using the standard protocol following bolus administration of intravenous contrast. RADIATION DOSE REDUCTION: This exam was performed according to  the departmental dose-optimization program which includes automated exposure control, adjustment of the mA and/or kV according to patient size and/or use of iterative reconstruction technique. CONTRAST:  OMNIPAQUE  IOHEXOL  300 MG/ML  SOLN COMPARISON:  CT abdomen pelvis dated 05/26/2022. FINDINGS: Lower chest: The visualized lung bases are clear. No intra-abdominal free air or free fluid. Hepatobiliary: Small cyst in the inferior right lobe of the liver. There is mild biliary dilatation, post cholecystectomy. Pancreas: Unremarkable. No pancreatic ductal dilatation or surrounding inflammatory changes. Spleen: Normal in size without focal abnormality. Adrenals/Urinary Tract: The adrenal glands unremarkable. Bilateral renal cysts. There is no hydronephrosis on either side. There is symmetric enhancement and excretion of contrast by both kidneys. The visualized ureters and urinary bladder appear unremarkable. Stomach/Bowel: Postsurgical changes of gastric bypass. There is moderate amount of stool throughout the colon. There is no bowel obstruction. The appendix is normal. Vascular/Lymphatic: Mild aortoiliac atherosclerotic disease. The IVC is unremarkable. No portal venous gas. There is no adenopathy. Reproductive: Hysterectomy.  No suspicious adnexal masses. Other: Mild diffuse subcutaneous edema. Musculoskeletal: Osteopenia with degenerative changes. No acute osseous pathology. IMPRESSION: 1. No acute intra-abdominal or pelvic pathology. 2. Moderate colonic stool burden. No bowel obstruction. Normal appendix. 3.  Aortic Atherosclerosis (ICD10-I70.0). Electronically Signed   By: Vanetta Chou M.D.   On: 09/17/2023 20:08    EKG/Medicine tests: Not indicated EKG Interpretation:    Interventions: LR bolus, Zofran , fentanyl , D50  See the EMR for full details regarding lab and imaging results.  Patient has diffuse abdominal pain, though soft, and describes pain as similar to her prior episodes of  obstruction.  Do feel that symptomatic treatment, labs and imaging are indicated.  Labs obtained and patient is hyperglycemic, D50 ordered and serial blood glucose monitored.  Lactic acid WNL, therefore doubt mesenteric ischemia, severe sepsis.  Lipase WNL, and no evidence of pancreatitis on CT, pancreatitis less likely.  No right upper quadrant tenderness, negative Murphy sign, doubt cholecystitis.  UA with moderate LE, patient already being appropriately treated with Macrobid .  Overall given reassuring labs and CT, doubt acute intra-abdominal process.  Patient did have hypoglycemia which was treated with dextrose .  Patient was monitored and a 3-hour repeat level continued to be normal, therefore do not feel that patient requires additional evaluation for hypoglycemia.  Patient continued to have significant abdominal pain despite fluids, opioids, antinausea medications, therefore given Haldol  for possible functional/muscular/bloating component of pain with resolution of pain.  Given resolution of pain, reassuring workup, feel that  patient is stable for continued outpatient workup.  Recommended patient easy to digest foods, relative rest, follow-up with PCP.  Patient and husband agreeable to this plan.  Presentation is most consistent with acute complicated illness  Discussion of management or test interpretations with external provider(s): Not indicated  Risk Drugs:Prescription drug management and Parenteral controlled substances  Disposition: DISCHARGE: I believe that the patient is safe for discharge home with outpatient follow-up. Patient was informed of all pertinent physical exam, laboratory, and imaging findings. Patient's suspected etiology of their symptom presentation was discussed with the patient and all questions were answered. We discussed following up with PCP. I provided thorough ED return precautions. The patient feels safe and comfortable with this plan.  MDM generated using voice  dictation software and may contain dictation errors.  Please contact me for any clarification or with any questions.  Clinical Impression:  1. Generalized abdominal pain   2. Nausea and vomiting, unspecified vomiting type      Discharge   Final Clinical Impression(s) / ED Diagnoses Final diagnoses:  Generalized abdominal pain  Nausea and vomiting, unspecified vomiting type    Rx / DC Orders ED Discharge Orders     None        Rogelia Jerilynn RAMAN, MD 09/17/23 2158

## 2023-09-17 NOTE — Telephone Encounter (Signed)
 911 dispatched by this RN with patient's permission, this RN remained on the phone with patient until they're arrival.  FYI Only or Action Required?: FYI only for provider.  Patient was last seen in primary care on 09/15/2023 by Jodie Lavern CROME, MD.  Called Nurse Triage reporting Abdominal Pain.  Symptoms began today.  Interventions attempted: Rest, hydration, or home remedies.  Symptoms are: rapidly worsening.  Triage Disposition: Call EMS 911 Now (overriding Go to ED Now (Notify PCP))  Patient/caregiver understands and will follow disposition?: Yes Reason for Disposition  [1] SEVERE pain AND [2] age > 60 years  [1] SEVERE pain (e.g., excruciating) AND [2] present > 1 hour  [1] Vomiting AND [2] contains red blood or black (coffee ground) material  (Exception: Few red streaks in vomit that only happened once.)  Answer Assessment - Initial Assessment Questions Pt reports 10/10 mid-abdominal pain that radiates to her back that began at 1400 today. Has been experiencing nausea and vomiting and reports some blood in her emesis.   1. LOCATION: Where does it hurt?      Umbilical region  2. RADIATION: Does the pain shoot anywhere else? (e.g., chest, back)     Radiates to back  3. ONSET: When did the pain begin? (e.g., minutes, hours or days ago)      1400 today  4. SUDDEN: Gradual or sudden onset?     Sudden  5. PATTERN Does the pain come and go, or is it constant?     Constant  6. SEVERITY: How bad is the pain?  (e.g., Scale 1-10; mild, moderate, or severe)     10/10  7. OTHER SYMPTOMS: Do you have any other symptoms? (e.g., back pain, diarrhea, fever, urination pain, vomiting)       Nausea, vomiting, abdominal distention  Protocols used: Abdominal Pain - [facility] Copied from CRM #[id number]. Topic: Clinical - Red Word Triage >> Sep 17, 2023  3:54 PM [place] wrote: Red Word that prompted transfer to Nurse Triage: [facility] been throwing up- blood in throw up. Heart  rate is at 97. Pain radiating from stomach to back- stomach is bloated

## 2023-09-17 NOTE — Discharge Instructions (Signed)
 Denise Rice  Thank you for allowing us  to take care of you today.  You came to the Emergency Department today because you had abdominal pain and persistent vomiting beginning today.  You thought that you might have a bowel obstruction as he had previously had those.  We got a CT of the abdomen which did not show any abnormality such as bowel blockage, pancreatitis or abnormality in your belly.  Your labs also are reassuring against emergency abnormalities any other abdomen.  We gave you Haldol , which in addition to being able to with medication to help with functional abdominal pain such as abnormal cramping of the bowels, you are feeling much better.  We recommend eating a easy to digest diet over the next several days.  Additionally we recommend finishing your course of Macrobid  that you have previously been prescribed.  To-Do: 1. Please follow-up with your primary doctor within 1 week/ as soon as possible.   Please return to the Emergency Department or call 911 if you experience have worsening of your symptoms, or do not get better, chest pain, shortness of breath, severe or significantly worsening pain, high fever, severe confusion, pass out or have any reason to think that you need emergency medical care.   We hope you feel better soon.   Mitzie Later, MD Department of Emergency Medicine [facility]

## 2023-09-17 NOTE — Progress Notes (Signed)
 See mychart note

## 2023-09-18 NOTE — Telephone Encounter (Signed)
 Noted

## 2023-09-20 ENCOUNTER — Other Ambulatory Visit: Payer: Self-pay | Admitting: Physician Assistant

## 2023-09-20 ENCOUNTER — Other Ambulatory Visit: Payer: Self-pay | Admitting: Family Medicine

## 2023-09-21 ENCOUNTER — Other Ambulatory Visit (HOSPITAL_COMMUNITY): Payer: Self-pay

## 2023-09-21 ENCOUNTER — Other Ambulatory Visit: Payer: Self-pay

## 2023-09-21 MED ORDER — DICYCLOMINE HCL 20 MG PO TABS
20.0000 mg | ORAL_TABLET | Freq: Two times a day (BID) | ORAL | 0 refills | Status: DC | PRN
  Filled 2023-09-21: qty 20, 10d supply, fill #0

## 2023-09-22 ENCOUNTER — Other Ambulatory Visit: Payer: Self-pay | Admitting: Family Medicine

## 2023-09-22 ENCOUNTER — Other Ambulatory Visit (HOSPITAL_COMMUNITY): Payer: Self-pay

## 2023-09-22 MED ORDER — ONDANSETRON HCL 4 MG PO TABS
4.0000 mg | ORAL_TABLET | Freq: Three times a day (TID) | ORAL | 0 refills | Status: DC | PRN
  Filled 2023-09-22: qty 20, 7d supply, fill #0

## 2023-10-10 ENCOUNTER — Other Ambulatory Visit (HOSPITAL_COMMUNITY): Payer: Self-pay

## 2023-10-13 ENCOUNTER — Other Ambulatory Visit: Payer: Self-pay

## 2023-10-13 ENCOUNTER — Other Ambulatory Visit (HOSPITAL_COMMUNITY): Payer: Self-pay

## 2023-10-16 ENCOUNTER — Other Ambulatory Visit: Payer: Self-pay

## 2023-10-19 ENCOUNTER — Other Ambulatory Visit: Payer: Self-pay

## 2023-10-24 ENCOUNTER — Encounter: Payer: Self-pay | Admitting: Family Medicine

## 2023-10-29 ENCOUNTER — Other Ambulatory Visit (HOSPITAL_COMMUNITY): Payer: Self-pay

## 2023-10-30 ENCOUNTER — Other Ambulatory Visit: Payer: Self-pay | Admitting: Medical Genetics

## 2023-10-30 DIAGNOSIS — Z006 Encounter for examination for normal comparison and control in clinical research program: Secondary | ICD-10-CM

## 2023-11-10 ENCOUNTER — Other Ambulatory Visit (HOSPITAL_COMMUNITY): Payer: Self-pay

## 2023-11-11 ENCOUNTER — Encounter: Payer: Self-pay | Admitting: Family Medicine

## 2023-11-11 ENCOUNTER — Ambulatory Visit (INDEPENDENT_AMBULATORY_CARE_PROVIDER_SITE_OTHER): Admitting: Family Medicine

## 2023-11-11 VITALS — BP 138/88 | HR 65 | Temp 98.1°F | Ht 60.0 in | Wt 123.0 lb

## 2023-11-11 DIAGNOSIS — Z23 Encounter for immunization: Secondary | ICD-10-CM | POA: Diagnosis not present

## 2023-11-11 DIAGNOSIS — E282 Polycystic ovarian syndrome: Secondary | ICD-10-CM | POA: Diagnosis not present

## 2023-11-11 DIAGNOSIS — L708 Other acne: Secondary | ICD-10-CM | POA: Diagnosis not present

## 2023-11-11 DIAGNOSIS — Z8679 Personal history of other diseases of the circulatory system: Secondary | ICD-10-CM

## 2023-11-12 ENCOUNTER — Ambulatory Visit: Admitting: Family

## 2023-11-12 ENCOUNTER — Ambulatory Visit (INDEPENDENT_AMBULATORY_CARE_PROVIDER_SITE_OTHER): Payer: No Typology Code available for payment source

## 2023-11-12 VITALS — Ht 60.0 in | Wt 121.0 lb

## 2023-11-12 DIAGNOSIS — Z0000 Encounter for general adult medical examination without abnormal findings: Secondary | ICD-10-CM | POA: Diagnosis not present

## 2023-11-12 NOTE — Progress Notes (Signed)
 Subjective  CC:  Chief Complaint  Patient presents with   Hypertension    HPI: Denise Rice is a 73 y.o. female who presents to the office today to address the problems listed above in the chief complaint. Hypertension f/u:  Discussed the use of AI scribe software for clinical note transcription with the patient, who gave verbal consent to proceed.  History of Present Illness Denise Rice is a 73 year old female who presents for follow up on blood pressure and with concerns about acne and skin changes.  Facial acne and skin changes - Acne with pimples and blackheads present on the face - No recent changes in skincare routine; continues to use the same brand of facial products - Initiated over-the-counter adapalene gel for acne treatment - Finds the skin changes bothersome  Blood pressure and headache symptoms - History of hypertension previously managed with medication - Initial episodes of elevated blood pressure associated with severe headaches - Antihypertensive medication dose was reduced and subsequently discontinued - Current blood pressure readings approximately 120/80 mmHg and stable - No current headaches reported  General well-being - Good energy levels - Sleep quality is good   Assessment  1. History of hypertension   2. Need for influenza vaccination   3. PCOS (polycystic ovarian syndrome)   4. Other acne      Plan  Assessment and Plan Assessment & Plan Skin concerns (facial bumps, blackheads, cosmetic aging) Complains of facial bumps and blackheads, primarily on the nose and face. No recent changes in facial products. Using over-the-counter adapalene gel, which is appropriate for her condition. No signs of rosacea. Discussed cosmetic aging concerns and interest in cosmetic procedures such as threads and skin tightening treatments. Financial constraints limit options for more expensive treatments. - Continue using adapalene gel for facial bumps and  blackheads - Consider medical-grade skincare products for wrinkle reduction and skin tightening - Discussed potential cosmetic procedures, but deferred due to financial constraints  Hypertension, controlled Previously on antihypertensive medication, which was discontinued after blood pressure stabilized. Current readings are slightly higher than ideal but generally within acceptable range (approximately 120/80 mmHg). No current symptoms of concern related to hypertension. - Monitor blood pressure regularly - Schedule follow-up in six months to reassess blood pressure  General Health Maintenance Received flu shot today. Uncertain about completion of shingles vaccination series. Needs confirmation of shingles vaccination status. - Confirm completion of shingles vaccination series and update medical records     Education regarding management of these chronic disease states was given. Management strategies discussed on successive visits include dietary and exercise recommendations, goals of achieving and maintaining IBW, and lifestyle modifications aiming for adequate sleep and minimizing stressors.  Follow up: 6 mo for cpe  Orders Placed This Encounter  Procedures   Flu vaccine HIGH DOSE PF(Fluzone Trivalent)   No orders of the defined types were placed in this encounter.     BP Readings from Last 3 Encounters:  11/11/23 138/88  09/17/23 104/71  05/25/23 129/81   Wt Readings from Last 3 Encounters:  11/11/23 123 lb (55.8 kg)  09/15/23 120 lb (54.4 kg)  05/25/23 125 lb 6.4 oz (56.9 kg)    Lab Results  Component Value Date   CHOL 137 01/24/2022   CHOL 163 08/03/2018   CHOL 164 06/23/2018   Lab Results  Component Value Date   HDL 84.40 01/24/2022   HDL 75 08/03/2018   HDL 75.30 06/23/2018   Lab Results  Component Value Date  LDLCALC 42 01/24/2022   LDLCALC 67 08/03/2018   LDLCALC 69 06/23/2018   Lab Results  Component Value Date   TRIG 53.0 01/24/2022   TRIG 107  08/03/2018   TRIG 101.0 06/23/2018   Lab Results  Component Value Date   CHOLHDL 2 01/24/2022   CHOLHDL 2 06/23/2018   CHOLHDL 2.1 06/16/2012   No results found for: LDLDIRECT Lab Results  Component Value Date   CREATININE 0.79 09/17/2023   BUN 16 09/17/2023   NA 141 09/17/2023   K 3.8 09/17/2023   CL 105 09/17/2023   CO2 25 09/17/2023    The 10-year ASCVD risk score (Arnett DK, et al., 2019) is: 9%   Values used to calculate the score:     Age: 22 years     Clincally relevant sex: Female     Is Non-Hispanic African American: No     Diabetic: No     Tobacco smoker: No     Systolic Blood Pressure: 138 mmHg     Is BP treated: No     HDL Cholesterol: 84.4 mg/dL     Total Cholesterol: 137 mg/dL  I reviewed the patients updated PMH, FH, and SocHx.    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    Priority: Medium    IFG (impaired fasting glucose) 11/16/2020    Priority: Medium    History of migraine 05/04/2019    Priority: Medium    History of small bowel obstruction 11/17/2017    Priority: Medium    Chronic vertigo 03/11/2017    Priority: Medium    Major depression, chronic 03/11/2017    Priority: Medium    PCOS (polycystic ovarian syndrome) 03/11/2017    Priority: Medium    Insomnia 09/08/2015    Priority: Medium    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 She has never used smokeless tobacco. She reports that she does not drink alcohol and does not use drugs.  Current Meds  Medication Sig   acetaminophen  (TYLENOL ) 325 MG tablet Take 325 mg by mouth daily as needed for headache.   Biotin 10 MG CAPS Take by mouth.   busPIRone  (BUSPAR ) 7.5 MG tablet Take 1 tablet (7.5 mg total) by mouth 2 (two) times daily as needed (anxiety).   Cholecalciferol (VITAMIN D ) 50 MCG (2000 UT) CAPS Take 2,000 Units by mouth daily.   cyanocobalamin  (VITAMIN B12) 1000 MCG/ML injection Inject 1 mL (1,000 mcg total) into the muscle every 30 (thirty) days.   diazepam  (VALIUM ) 10 MG tablet Take 1 tablet (10 mg total) by mouth daily as needed for anxiety   dicyclomine  (BENTYL ) 20 MG tablet Take 1 tablet (20 mg total) by mouth 2 (two) times daily as needed for spasms (abdominal pain).   ferrous sulfate 325 (65 FE) MG  tablet Take 325 mg by mouth daily with breakfast.   mupirocin  ointment (BACTROBAN ) 2 % Apply to affected areas topically 2 (two) times daily.   ondansetron  (ZOFRAN ) 4 MG tablet Take 1 tablet (4 mg total) by mouth every 8 (eight) hours as needed for nausea or vomiting.   Potassium (POTASSIMIN PO) Take 1 tablet by mouth daily.   Syringe/Needle, Disp, (SYRINGE 3CC/25GX1) 25G X 1 3 ML MISC Use once monthly for injection of Vitamin B-12   traZODone  (DESYREL ) 100 MG tablet Take 1 - 1.5 tablets (100-150 mg total) by mouth at bedtime as needed for sleep.    Review of Systems: Cardiovascular: negative for chest pain, palpitations, leg swelling, orthopnea Respiratory: negative for SOB, wheezing or persistent cough Gastrointestinal: negative for abdominal pain Genitourinary: negative for dysuria or gross hematuria  Objective  Vitals: BP 138/88   Pulse 65   Temp 98.1 F (36.7 C)   Ht 5' (1.524 m)   Wt 123 lb (55.8 kg)   LMP 01/13/2005   SpO2 98%   BMI 24.02 kg/m  General: no acute  distress  Psych:  Alert and oriented, normal mood and affect HEENT:  Normocephalic, atraumatic, supple neck  Cardiovascular:  RRR without murmur. no edema Respiratory:  Good breath sounds bilaterally, CTAB with normal respiratory effort Skin:  Warm, no rashes, minimal patches of tiny papules on cheeks Neurologic:   Mental status is normal Commons side effects, risks, benefits, and alternatives for medications and treatment plan prescribed today were discussed, and the patient expressed understanding of the given instructions. Patient is instructed to call or message via MyChart if he/she has any questions or concerns regarding our treatment plan. No barriers to understanding were identified. We discussed Red Flag symptoms and signs in detail. Patient expressed understanding regarding what to do in case of urgent or emergency type symptoms.  Medication list was reconciled, printed and provided to the patient in AVS. Patient instructions and summary information was reviewed with the patient as documented in the AVS. This note was prepared with assistance of Dragon voice recognition software. Occasional wrong-word or sound-a-like substitutions may have occurred due to the inherent limitation

## 2023-11-12 NOTE — Patient Instructions (Signed)
 Denise Rice,  Thank you for taking the time for your Medicare Wellness Visit. I appreciate your continued commitment to your health goals. Please review the care plan we discussed, and feel free to reach out if I can assist you further.  Medicare recommends these wellness visits once per year to help you and your care team stay ahead of potential health issues. These visits are designed to focus on prevention, allowing your provider to concentrate on managing your acute and chronic conditions during your regular appointments.  Please note that Annual Wellness Visits do not include a physical exam. Some assessments may be limited, especially if the visit was conducted virtually. If needed, we may recommend a separate in-person follow-up with your provider.  Ongoing Care Seeing your primary care provider every 3 to 6 months helps us  monitor your health and provide consistent, personalized care.   Referrals If a referral was made during today's visit and you haven't received any updates within two weeks, please contact the referred provider directly to check on the status.  Recommended Screenings:  Health Maintenance  Topic Date Due   Zoster (Shingles) Vaccine (2 of 2) 07/04/2021   COVID-19 Vaccine (5 - 2025-26 season) 11/27/2023*   Breast Cancer Screening  05/13/2024   Medicare Annual Wellness Visit  11/11/2024   DEXA scan (bone density measurement)  07/08/2025   DTaP/Tdap/Td vaccine (4 - Td or Tdap) 08/13/2026   Colon Cancer Screening  02/13/2032   Pneumococcal Vaccine for age over 50  Completed   Flu Shot  Completed   Hepatitis C Screening  Completed   Meningitis B Vaccine  Aged Out   Hepatitis B Vaccine  Discontinued  *Topic was postponed. The date shown is not the original due date.       11/06/2022    7:53 AM  Advanced Directives  Does Patient Have a Medical Advance Directive? Yes  Type of Estate Agent of [place];Living will  Copy of Healthcare Power of  Attorney in Chart? No - copy requested   Advance Care Planning is important because it: Ensures you receive medical care that aligns with your values, goals, and preferences. Provides guidance to your family and loved ones, reducing the emotional burden of decision-making during critical moments.  Vision: Annual vision screenings are recommended for early detection of glaucoma, cataracts, and diabetic retinopathy. These exams can also reveal signs of chronic conditions such as diabetes and high blood pressure.  Dental: Annual dental screenings help detect early signs of oral cancer, gum disease, and other conditions linked to overall health, including heart disease and diabetes.  Please see the attached documents for additional preventive care recommendations.

## 2023-11-12 NOTE — Progress Notes (Signed)
 Subjective:   Denise Rice is a 73 y.o. who presents for a Medicare Wellness preventive visit.  As a reminder, Annual Wellness Visits don't include a physical exam, and some assessments may be limited, especially if this visit is performed virtually. We may recommend an in-person follow-up visit with your provider if needed.  Visit Complete: Virtual I connected with  Rock DELENA Perks on 11/12/23 by a video and audio enabled telemedicine application and verified that I am speaking with the correct person using two identifiers.  Patient Location: Home  Provider Location: Office/Clinic  I discussed the limitations of evaluation and management by telemedicine. The patient expressed understanding and agreed to proceed.  Vital Signs: Because this visit was a virtual/telehealth visit, some criteria may be missing or patient reported. Any vitals not documented were not able to be obtained and vitals that have been documented are patient reported.    Persons Participating in Visit: Patient.  AWV Questionnaire: Yes: Patient Medicare AWV questionnaire was completed by the patient on 11/10/23; I have confirmed that all information answered by patient is correct and no changes since this date.  Cardiac Risk Factors include: advanced age (>23men, >51 women);hypertension     Objective:    Today's Vitals   11/12/23 1535  Weight: 121 lb (54.9 kg)  Height: 5' (1.524 m)   Body mass index is 23.63 kg/m.     11/12/2023    3:41 PM 11/06/2022    7:53 AM 05/26/2022    6:22 PM 12/19/2021    3:46 PM 10/29/2021    7:56 AM 08/15/2021    2:51 AM 08/14/2021   11:19 PM  Advanced Directives  Does Patient Have a Medical Advance Directive? Yes Yes Yes No Yes Yes No  Type of Estate Agent of [place];Living will Healthcare Power of [place];Living will Living will;Healthcare Power of [employer] Power of [place];Living will Healthcare Power of [place];Living will;Out of facility  DNR (pink MOST or yellow form)   Does patient want to make changes to medical advance directive?      Yes (ED - send information to MyChart)   Copy of Healthcare Power of Attorney in Chart? No - copy requested No - copy requested   No - copy requested No - copy requested   Would patient like information on creating a medical advance directive?    No - Patient declined       Current Medications (verified) Outpatient Encounter Medications as of 11/12/2023  Medication Sig   acetaminophen  (TYLENOL ) 325 MG tablet Take 325 mg by mouth daily as needed for headache.   Biotin 10 MG CAPS Take by mouth.   busPIRone  (BUSPAR ) 7.5 MG tablet Take 1 tablet (7.5 mg total) by mouth 2 (two) times daily as needed (anxiety).   Cholecalciferol (VITAMIN D ) 50 MCG (2000 UT) CAPS Take 2,000 Units by mouth daily.   cyanocobalamin  (VITAMIN B12) 1000 MCG/ML injection Inject 1 mL (1,000 mcg total) into the muscle every 30 (thirty) days.   diazepam  (VALIUM ) 10 MG tablet Take 1 tablet (10 mg total) by mouth daily as needed for anxiety   dicyclomine  (BENTYL ) 20 MG tablet Take 1 tablet (20 mg total) by mouth 2 (two) times daily as needed for spasms (abdominal pain).   ferrous sulfate 325 (65 FE) MG tablet Take 325 mg by mouth daily with breakfast.   mupirocin  ointment (BACTROBAN ) 2 % Apply to affected areas topically 2 (two) times daily.   ondansetron  (ZOFRAN ) 4 MG tablet Take  1 tablet (4 mg total) by mouth every 8 (eight) hours as needed for nausea or vomiting.   Potassium (POTASSIMIN PO) Take 1 tablet by mouth daily.   Syringe/Needle, Disp, (SYRINGE 3CC/25GX1) 25G X 1 3 ML MISC Use once monthly for injection of Vitamin B-12   traZODone  (DESYREL ) 100 MG tablet Take 1 - 1.5 tablets (100-150 mg total) by mouth at bedtime as needed for sleep.   No facility-administered encounter medications on file as of 11/12/2023.    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    craniotomy for repair of R ear   Vertigo    Vitamin B12 deficiency 07/29/2018   Started IM injections monthly 06/2018   Vitamin D  deficiency    Past Surgical History:  Procedure Laterality Date   BLEPHAROPLASTY  03-05-2000   CRANIOTOMY  08/2014   to repair dehescence in right ear   HAMMER TOE SURGERY Left 08-29-2013   2nd toe   IMPLANTATION BONE ANCHORED HEARING AID Left 07/27/2008   left temporal bone;now removed   LAPAROSCOPIC CHOLECYSTECTOMY  03-31-2005   LAPAROSCOPIC GASTRIC BANDING  02-08-2007   LAPAROSCOPY N/A 11/17/2017   Procedure: LAPAROSCOPY LYSIS OF ADHESIONS FOR SMALL BOWEL OBSTRUCTION;  Surgeon: Mikell Katz, MD;  Location: [facility] OR;  Service: General;  Laterality: N/A;   LASER ABLATION CONDOLAMATA N/A 07/30/2012   Procedure: LASER ABLATION CONDOLAMATA;  Surgeon: Bernarda Ned, MD;  Location: [facility];  Service: General;  Laterality: N/A;   LASIK Bilateral 2002   LYSIS OF ADHESION N/A 11/17/2017   Procedure: LYSIS OF ADHESION;  Surgeon: Mikell Katz, MD;  Location: [facility] OR;  Service: General;  Laterality: N/A;   ROUX-EN-Y GASTRIC BYPASS  AUG 2013   STAPEDES SURGERY Left 1985;  1994 x2;  02-03-1998   STRABISMUS SURGERY Right    VAGINAL HYSTERECTOMY  2007   fibroids   Family History  Problem Relation Age of Onset   Heart attack Mother    Heart disease Mother    Diabetes Mother    Hypertension Mother     Hyperlipidemia Mother    Stroke Mother    Kidney disease Mother    Thyroid  disease Mother    Anxiety disorder Mother    Obesity Mother    Cancer - Prostate Father    Diabetes Father    Hypertension Father    Hyperlipidemia Father    Heart disease Father    Stroke Father    Kidney disease Father    Obesity Father    Stroke Sister    Diabetes Sister    High blood pressure Sister    Diabetes Maternal Aunt    Heart disease Maternal Aunt    Cancer Paternal Aunt    Cancer Paternal Uncle    Colon cancer Neg Hx    Colon polyps  Neg Hx    Breast cancer Neg Hx    Social History   Socioeconomic History   Marital status: Married    Spouse name: Ozell Stallion    Number of children: 1   Years of education: MBA   Highest education level: Master's degree (e.g., MA, MS, MEng, MEd, MSW, MBA)  Occupational History   Occupation: Customer Service Manager - retired    Associate Professor: [place]    Comment: [employer]   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   Left handed.   Caffeine use: 2-3 cups (coffee) per day    Social Drivers of Health   Financial Resource Strain: Low Risk  (11/10/2023)   Overall Financial Resource Strain (CARDIA)    Difficulty of Paying Living Expenses: Not very hard  Food Insecurity: No Food Insecurity (11/10/2023)   Hunger Vital Sign    Worried About Running Out of Food in the Last Year: Never true    Ran Out of Food in the Last Year: Never true  Transportation Needs: No Transportation Needs (11/10/2023)   PRAPARE - Administrator, Civil Service  (Medical): No    Lack of Transportation (Non-Medical): No  Physical Activity: Sufficiently Active (11/10/2023)   Exercise Vital Sign    Days of Exercise per Week: 3 days    Minutes of Exercise per Session: 60 min  Stress: Stress Concern Present (11/10/2023)   [employer] of Occupational Health - Occupational Stress Questionnaire    Feeling of Stress: To some extent  Social Connections: Moderately Integrated (11/10/2023)   Social Connection and Isolation Panel    Frequency of Communication with Friends and Family: More than three times a week    Frequency of Social Gatherings with Friends and Family: Twice a week    Attends Religious Services: 1 to 4 times per year    Active Member of [employer] or Organizations: No    Attends Engineer, Structural: Not on file    Marital Status: Married    Tobacco Counseling Counseling given: Not Answered    Clinical Intake:  Pre-visit preparation completed: Yes  Pain : No/denies pain     BMI - recorded: 23.63 Nutritional Status: BMI of 19-24  Normal Nutritional Risks: None Diabetes: No  Lab Results  Component Value Date   HGBA1C 5.8 04/01/2023   HGBA1C 5.6 01/24/2022   HGBA1C 6.0 08/27/2020     How often do you need to have someone help you when you read instructions, pamphlets, or other written materials from your doctor or pharmacy?: 1 - Never  Interpreter Needed?: No  Information entered by :: Ellouise Haws, LPN   Activities of Daily Living     11/10/2023    3:18 PM  In your present state of health, do you have any difficulty performing the following activities:  Hearing? 1  Comment hearing aid right ear left ear loss of hearing  Vision? 0  Difficulty concentrating or making decisions? 0  Walking or climbing stairs? 0  Dressing or bathing? 0  Doing errands, shopping? 0  Preparing Food and eating ? N  Using the Toilet? N  In the past six months, have you accidently leaked urine? Y  Do you have problems  with loss of bowel control? N  Managing your Medications? N  Managing your Finances? N  Housekeeping or managing your Housekeeping? N    Patient Care Team: Jodie Lavern CROME, MD as PCP - General (Family Medicine) Cleotilde Ronal RAMAN, MD as Consulting Physician (Gynecology) Ines Onetha NOVAK, MD as Consulting Physician (Neurology) Hughie Sharper, MD as Consulting Physician (Sports Medicine)  I have updated your Care Teams any recent Medical Services you may have received from other providers in the past year.     Assessment:   This is a routine wellness examination for [place].  Hearing/Vision screen Hearing Screening - Comments:: hearing aid right ear left ear loss of hearing Vision Screening - Comments:: Wears rx glasses - up to date with routine eye exams with Dr Octavia    Goals Addressed   None    Depression Screen     11/12/2023    3:40 PM 05/25/2023    2:45 PM 05/13/2023   12:59 PM 04/01/2023   11:22 AM 03/11/2023    1:05 PM 11/06/2022    7:49 AM 09/29/2022    3:40 PM  PHQ 2/9 Scores  PHQ - 2 Score 0 0 0 0 0 0 2  PHQ- 9 Score      0 4    Fall Risk     11/10/2023    3:18 PM 09/15/2023    8:16 AM 05/25/2023    2:44 PM 05/13/2023   12:59 PM 03/11/2023    1:04 PM  Fall Risk   Falls in the past year? 0 0 0 0 0  Number falls in past yr: 0 0 0 0 0  Injury with Fall? 0 0 0 0 0  Risk for fall due to : Impaired balance/gait No Fall Risks No Fall Risks No Fall Risks No Fall Risks  Follow up Falls prevention discussed Falls evaluation completed Falls evaluation completed Falls evaluation completed Falls evaluation completed    MEDICARE RISK AT HOME:  Medicare Risk at Home Any stairs in or around the home?: (Patient-Rptd) Yes If so, are there any without handrails?: (Patient-Rptd) Yes Home free of loose throw rugs in walkways, pet beds, electrical cords, etc?: (Patient-Rptd) Yes Adequate lighting in your home to reduce risk of falls?: (Patient-Rptd) Yes Life alert?: (Patient-Rptd)  No Use of a cane, walker or w/c?: (Patient-Rptd) No Grab bars in the bathroom?: (Patient-Rptd) Yes Shower chair or bench in shower?: (Patient-Rptd) Yes Elevated toilet seat or a handicapped toilet?: (Patient-Rptd) No  TIMED UP AND GO:  Was the test performed?  No  Cognitive Function: 6CIT completed        11/12/2023    3:42 PM 11/06/2022    7:54 AM 10/29/2021    7:59 AM 10/25/2020    1:39 PM  6CIT Screen  What Year? 0 points 0 points 0 points 0 points  What month? 0 points 0 points 0 points 0 points  What time? 0 points 0 points 0 points 0 points  Count back from 20 0 points 0 points 0 points 0 points  Months in reverse 0 points 0 points 0 points 0 points  Repeat phrase 0 points 0 points 0 points 6 points  Total Score 0 points 0 points 0 points 6 points    Immunizations Immunization History  Administered Date(s) Administered  Fluad Quad(high Dose 65+) 10/01/2018, 09/23/2019, 10/25/2020, 10/15/2021   Fluad Trivalent(High Dose 65+) 09/29/2022   Fluzone Influenza virus vaccine,trivalent (IIV3), split virus 10/08/2011   Hepatitis A 08/01/2015, 02/06/2016   Hepatitis B 08/01/2015, 10/03/2015, 02/06/2016   INFLUENZA, HIGH DOSE SEASONAL PF 11/11/2023   Influenza-Unspecified 09/29/2014, 09/26/2016, 10/01/2018   PFIZER(Purple Top)SARS-COV-2 Vaccination 01/24/2019, 02/11/2019, 10/27/2019   Pfizer Covid-19 Vaccine Bivalent Booster 48yrs & up 12/12/2020   Pneumococcal Conjugate-13 08/25/2014   Pneumococcal Polysaccharide-23 08/15/2013, 06/23/2018   Td 02/14/1995   Tdap 02/28/2009, 08/12/2016   Zoster Recombinant(Shingrix ) 05/09/2021   Zoster, Live 05/19/2013    Screening Tests Health Maintenance  Topic Date Due   Zoster Vaccines- Shingrix  (2 of 2) 07/04/2021   COVID-19 Vaccine (5 - 2025-26 season) 11/27/2023 (Originally 09/14/2023)   Mammogram  05/13/2024   Medicare Annual Wellness (AWV)  11/11/2024   DEXA SCAN  07/08/2025   DTaP/Tdap/Td (4 - Td or Tdap) 08/13/2026    Colonoscopy  02/13/2032   Pneumococcal Vaccine: 50+ Years  Completed   Influenza Vaccine  Completed   Hepatitis C Screening  Completed   Meningococcal B Vaccine  Aged Out   Hepatitis B Vaccines 19-59 Average Risk  Discontinued    Health Maintenance Items Addressed: See Nurse Notes at the end of this note  Additional Screening:  Vision Screening: Recommended annual ophthalmology exams for early detection of glaucoma and other disorders of the eye. Is the patient up to date with their annual eye exam?  Yes  Who is the provider or what is the name of the office in which the patient attends annual eye exams? Dr Octavia   Dental Screening: Recommended annual dental exams for proper oral hygiene  Community Resource Referral / Chronic Care Management: CRR required this visit?  No   CCM required this visit?  No   Plan:    I have personally reviewed and noted the following in the patient's chart:   Medical and social history Use of alcohol, tobacco or illicit drugs  Current medications and supplements including opioid prescriptions. Patient is not currently taking opioid prescriptions. Functional ability and status Nutritional status Physical activity Advanced directives List of other physicians Hospitalizations, surgeries, and ER visits in previous 12 months Vitals Screenings to include cognitive, depression, and falls Referrals and appointments  In addition, I have reviewed and discussed with patient certain preventive protocols, quality metrics, and best practice recommendations. A written personalized care plan for preventive services as well as general preventive health recommendations were provided to patient.   Ellouise VEAR Haws, LPN   [id number]/[id number]   After Visit Summary: (MyChart) Due to this being a telephonic visit, the after visit summary with patients personalized plan was offered to patient via MyChart   Notes: Nothing significant to report at this time.

## 2023-11-13 ENCOUNTER — Other Ambulatory Visit: Payer: Self-pay

## 2023-11-16 ENCOUNTER — Other Ambulatory Visit: Payer: Self-pay | Admitting: Family Medicine

## 2023-11-16 ENCOUNTER — Other Ambulatory Visit (HOSPITAL_COMMUNITY): Payer: Self-pay

## 2023-11-16 MED ORDER — CYANOCOBALAMIN 1000 MCG/ML IJ SOLN
1000.0000 ug | INTRAMUSCULAR | 2 refills | Status: AC
  Filled 2023-11-16: qty 1, 30d supply, fill #0
  Filled 2023-12-13: qty 1, 30d supply, fill #1
  Filled 2024-01-11: qty 1, 30d supply, fill #2
  Filled 2024-02-10: qty 1, 30d supply, fill #3

## 2023-11-18 ENCOUNTER — Other Ambulatory Visit (HOSPITAL_COMMUNITY): Payer: Self-pay

## 2023-11-21 ENCOUNTER — Other Ambulatory Visit: Payer: Self-pay | Admitting: Family Medicine

## 2023-11-23 ENCOUNTER — Other Ambulatory Visit (HOSPITAL_COMMUNITY): Payer: Self-pay

## 2023-11-23 ENCOUNTER — Other Ambulatory Visit: Payer: Self-pay

## 2023-11-23 MED ORDER — DICYCLOMINE HCL 20 MG PO TABS
20.0000 mg | ORAL_TABLET | Freq: Two times a day (BID) | ORAL | 0 refills | Status: AC | PRN
  Filled 2023-11-23: qty 20, 10d supply, fill #0

## 2023-11-24 ENCOUNTER — Other Ambulatory Visit (HOSPITAL_COMMUNITY): Payer: Self-pay

## 2023-11-24 ENCOUNTER — Other Ambulatory Visit: Payer: Self-pay | Admitting: Family Medicine

## 2023-11-24 ENCOUNTER — Other Ambulatory Visit: Payer: Self-pay

## 2023-11-24 MED ORDER — SULFAMETHOXAZOLE-TRIMETHOPRIM 800-160 MG PO TABS
1.0000 | ORAL_TABLET | Freq: Two times a day (BID) | ORAL | 0 refills | Status: AC
  Filled 2023-11-24: qty 14, 7d supply, fill #0

## 2023-11-24 MED ORDER — TRIMETHOPRIM 100 MG PO TABS
100.0000 mg | ORAL_TABLET | Freq: Every day | ORAL | 11 refills | Status: AC
  Filled 2023-11-24: qty 30, 30d supply, fill #0
  Filled 2023-11-24: qty 10, 10d supply, fill #0
  Filled 2023-11-24 – 2023-11-25 (×2): qty 30, 30d supply, fill #0
  Filled 2023-12-18: qty 30, 30d supply, fill #1
  Filled 2024-01-17: qty 30, 30d supply, fill #2
  Filled 2024-02-16: qty 30, 30d supply, fill #3

## 2023-11-24 MED ORDER — ONDANSETRON HCL 4 MG PO TABS
4.0000 mg | ORAL_TABLET | Freq: Three times a day (TID) | ORAL | 0 refills | Status: AC | PRN
  Filled 2023-11-24 (×2): qty 20, 7d supply, fill #0

## 2023-11-25 ENCOUNTER — Other Ambulatory Visit: Payer: Self-pay

## 2023-11-25 ENCOUNTER — Other Ambulatory Visit (HOSPITAL_COMMUNITY): Payer: Self-pay

## 2023-12-05 ENCOUNTER — Telehealth: Admitting: Physician Assistant

## 2023-12-05 DIAGNOSIS — Z711 Person with feared health complaint in whom no diagnosis is made: Secondary | ICD-10-CM

## 2023-12-05 NOTE — Progress Notes (Signed)
 Virtual Visit Consent   Denise Rice, you are scheduled for a virtual visit with a [facility] provider today. Just as with appointments in the office, your consent must be obtained to participate. Your consent will be active for this visit and any virtual visit you may have with one of our providers in the next 365 days. If you have a MyChart account, a copy of this consent can be sent to you electronically.  As this is a virtual visit, video technology does not allow for your provider to perform a traditional examination. This may limit your provider's ability to fully assess your condition. If your provider identifies any concerns that need to be evaluated in person or the need to arrange testing (such as labs, EKG, etc.), we will make arrangements to do so. Although advances in technology are sophisticated, we cannot ensure that it will always work on either your end or our end. If the connection with a video visit is poor, the visit may have to be switched to a telephone visit. With either a video or telephone visit, we are not always able to ensure that we have a secure connection.  By engaging in this virtual visit, you consent to the provision of healthcare and authorize for your insurance to be billed (if applicable) for the services provided during this visit. Depending on your insurance coverage, you may receive a charge related to this service.  I need to obtain your verbal consent now. Are you willing to proceed with your visit today? Denise Rice has provided verbal consent on 12/05/2023 for a virtual visit (video or telephone). Teena Shuck, [place]  Date: 12/05/2023 11:29 AM   Virtual Visit via Video Note   I, Teena Shuck, connected with  Denise Rice  ([id number], Feb 18, 1950) on 12/05/23 at 11:15 AM EST by a video-enabled telemedicine application and verified that I am speaking with the correct person using two identifiers.  Location: Patient: Virtual Visit Location Patient:  Home Provider: Virtual Visit Location Provider: Home Office   I discussed the limitations of evaluation and management by telemedicine and the availability of in person appointments. The patient expressed understanding and agreed to proceed.    History of Present Illness: Denise Rice is a 73 y.o. who identifies as a female who was assigned female at birth, and is being seen today for left ear concern. States she has an area on her left ear that she's concerned for skin cancer. She has noticed the area for the last 2 weeks. No concern for infection at this time but she is very concerned this could be skin cancer.  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 06/25/2012   History of laparoscopic adjustable gastric banding 01/22/2011   Vitamin D  deficiency 01/15/2008    Allergies:  Allergies  Allergen Reactions   Lactose Diarrhea   Topiramate  Nausea Only  Alprazolam  Nausea Only   Medications:  Current Outpatient Medications:    acetaminophen  (TYLENOL ) 325 MG tablet, Take 325 mg by mouth daily as needed for headache., Disp: , Rfl:    Biotin 10 MG CAPS, Take by mouth., Disp: , Rfl:    busPIRone  (BUSPAR ) 7.5 MG tablet, Take 1 tablet (7.5 mg total) by mouth 2 (two) times daily as needed (anxiety)., Disp: 60 tablet, Rfl: 5   Cholecalciferol (VITAMIN  D) 50 MCG (2000 UT) CAPS, Take 2,000 Units by mouth daily., Disp: , Rfl:    cyanocobalamin  (VITAMIN B12) 1000 MCG/ML injection, Inject 1 mL (1,000 mcg total) into the muscle every 30 (thirty) days., Disp: 10 mL, Rfl: 2   diazepam  (VALIUM ) 10 MG tablet, Take 1 tablet (10 mg total) by mouth daily as needed for anxiety, Disp: 30 tablet, Rfl: 5   dicyclomine  (BENTYL ) 20 MG tablet, Take 1 tablet (20 mg total) by mouth 2 (two) times daily as needed for spasms (abdominal pain)., Disp: 20 tablet, Rfl: 0   ferrous sulfate 325 (65 FE) MG tablet, Take 325 mg by mouth daily with breakfast., Disp: , Rfl:    mupirocin  ointment (BACTROBAN ) 2 %, Apply to affected areas topically 2 (two) times daily., Disp: 22 g, Rfl: 0   ondansetron  (ZOFRAN ) 4 MG tablet, Take 1 tablet (4 mg total) by mouth every 8 (eight) hours as needed for nausea or vomiting., Disp: 20 tablet, Rfl: 0   Potassium (POTASSIMIN PO), Take 1 tablet by mouth daily., Disp: , Rfl:    sulfamethoxazole -trimethoprim  (BACTRIM  DS) 800-160 MG tablet, Take 1 tablet by mouth 2 (two) times daily., Disp: 14 tablet, Rfl: 0   Syringe/Needle, Disp, (SYRINGE 3CC/25GX1) 25G X 1 3 ML MISC, Use once monthly for injection of Vitamin B-12, Disp: 50 each, Rfl: 0   traZODone  (DESYREL ) 100 MG tablet, Take 1 - 1.5 tablets (100-150 mg total) by mouth at bedtime as needed for sleep., Disp: 45 tablet, Rfl: 3   trimethoprim  (TRIMPEX ) 100 MG tablet, Take 1 tablet (100 mg total) by mouth daily., Disp: 30 tablet, Rfl: 11  Observations/Objective: Patient is well-developed, well-nourished in no acute distress.  Resting comfortably  at home.  Head is normocephalic, atraumatic.  No labored breathing.  Speech is clear and coherent with logical content.  Patient is alert and oriented at baseline.  Unable to fully visualize area of concern on cam  Assessment and Plan: 1. Concern about skin cancer without diagnosis (Primary)  Patient with concern about possible skin cancer. Painless  area, no concern for infection. Advised she will need PCP follow up and dermatology referral if she is not establish with a clinic at this time. No concerns for shingles, cellulitis, insect bite, mastoiditis, or any other emergent ear pathology. Patient agreed with plan and counseling provided and to follow up in person with PCP.   Follow Up Instructions: I discussed the assessment and treatment plan with the patient. The patient was provided an opportunity to ask questions and all were answered. The patient agreed with the plan and demonstrated an understanding of the instructions.  A copy of instructions were sent to the patient via MyChart unless otherwise noted below.    The patient was advised to call back or seek an in-person evaluation if the symptoms worsen or if the condition fails to improve as anticipated.    Teena Shuck, PA-C

## 2023-12-05 NOTE — Patient Instructions (Signed)
 Denise Rice, thank you for joining Teena Shuck, PA-C for today's virtual visit.  While this provider is not your primary care provider (PCP), if your PCP is located in our provider database this encounter information will be shared with them immediately following your visit.   A [place] MyChart account gives you access to today's visit and all your visits, tests, and labs performed at [facility]  click here if you don't have a [place] MyChart account or go to [email or web address]  Consent: (Patient) Denise Rice provided verbal consent for this virtual visit at the beginning of the encounter.  Current Medications:  Current Outpatient Medications:    acetaminophen  (TYLENOL ) 325 MG tablet, Take 325 mg by mouth daily as needed for headache., Disp: , Rfl:    Biotin 10 MG CAPS, Take by mouth., Disp: , Rfl:    busPIRone  (BUSPAR ) 7.5 MG tablet, Take 1 tablet (7.5 mg total) by mouth 2 (two) times daily as needed (anxiety)., Disp: 60 tablet, Rfl: 5   Cholecalciferol (VITAMIN D ) 50 MCG (2000 UT) CAPS, Take 2,000 Units by mouth daily., Disp: , Rfl:    cyanocobalamin  (VITAMIN B12) 1000 MCG/ML injection, Inject 1 mL (1,000 mcg total) into the muscle every 30 (thirty) days., Disp: 10 mL, Rfl: 2   diazepam  (VALIUM ) 10 MG tablet, Take 1 tablet (10 mg total) by mouth daily as needed for anxiety, Disp: 30 tablet, Rfl: 5   dicyclomine  (BENTYL ) 20 MG tablet, Take 1 tablet (20 mg total) by mouth 2 (two) times daily as needed for spasms (abdominal pain)., Disp: 20 tablet, Rfl: 0   ferrous sulfate 325 (65 FE) MG tablet, Take 325 mg by mouth daily with breakfast., Disp: , Rfl:    mupirocin  ointment (BACTROBAN ) 2 %, Apply to affected areas topically 2 (two) times daily., Disp: 22 g, Rfl: 0   ondansetron  (ZOFRAN ) 4 MG tablet, Take 1 tablet (4 mg total) by mouth every 8 (eight) hours as needed for nausea or vomiting., Disp: 20 tablet, Rfl: 0   Potassium (POTASSIMIN PO), Take 1 tablet by  mouth daily., Disp: , Rfl:    sulfamethoxazole -trimethoprim  (BACTRIM  DS) 800-160 MG tablet, Take 1 tablet by mouth 2 (two) times daily., Disp: 14 tablet, Rfl: 0   Syringe/Needle, Disp, (SYRINGE 3CC/25GX1) 25G X 1 3 ML MISC, Use once monthly for injection of Vitamin B-12, Disp: 50 each, Rfl: 0   traZODone  (DESYREL ) 100 MG tablet, Take 1 - 1.5 tablets (100-150 mg total) by mouth at bedtime as needed for sleep., Disp: 45 tablet, Rfl: 3   trimethoprim  (TRIMPEX ) 100 MG tablet, Take 1 tablet (100 mg total) by mouth daily., Disp: 30 tablet, Rfl: 11   Medications ordered in this encounter:  No orders of the defined types were placed in this encounter.    *If you need refills on other medications prior to your next appointment, please contact your pharmacy*  Follow-Up: Call back or seek an in-person evaluation if the symptoms worsen or if the condition fails to improve as anticipated.  [facility] [phone]  Other Instructions Follow up with PCP. Report to ER with worsening symptoms.    If you have been instructed to have an in-person evaluation today at a local Urgent Care facility, please use the link below. It will take you to a list of all of our available [facility], including address, phone number and hours of operation. Please do not delay care.  [facility]  If  you or a family member do not have a primary care provider, use the link below to schedule a visit and establish care. When you choose a [facility] primary care physician or advanced practice provider, you gain a long-term partner in health. Find a Primary Care Provider  Learn more about [facility] in-office and virtual care options: [place] - Get Care Now

## 2023-12-08 ENCOUNTER — Other Ambulatory Visit (HOSPITAL_COMMUNITY): Payer: Self-pay

## 2023-12-09 ENCOUNTER — Encounter: Payer: Self-pay | Admitting: Family Medicine

## 2023-12-09 ENCOUNTER — Ambulatory Visit: Admitting: Family Medicine

## 2023-12-09 VITALS — BP 130/80 | HR 71 | Temp 98.0°F | Ht 61.0 in | Wt 122.4 lb

## 2023-12-09 DIAGNOSIS — L720 Epidermal cyst: Secondary | ICD-10-CM

## 2023-12-09 DIAGNOSIS — L819 Disorder of pigmentation, unspecified: Secondary | ICD-10-CM

## 2023-12-11 ENCOUNTER — Other Ambulatory Visit (HOSPITAL_COMMUNITY): Payer: Self-pay

## 2023-12-11 ENCOUNTER — Other Ambulatory Visit: Payer: Self-pay

## 2023-12-13 ENCOUNTER — Other Ambulatory Visit (HOSPITAL_COMMUNITY): Payer: Self-pay

## 2023-12-16 ENCOUNTER — Telehealth: Payer: Self-pay

## 2023-12-16 ENCOUNTER — Other Ambulatory Visit: Payer: Self-pay

## 2023-12-16 DIAGNOSIS — Z711 Person with feared health complaint in whom no diagnosis is made: Secondary | ICD-10-CM

## 2023-12-16 DIAGNOSIS — L989 Disorder of the skin and subcutaneous tissue, unspecified: Secondary | ICD-10-CM

## 2023-12-16 LAB — GENECONNECT MOLECULAR SCREEN: Genetic Analysis Overall Interpretation: NEGATIVE

## 2023-12-16 NOTE — Telephone Encounter (Signed)
 Copied from CRM #[id number]. Topic: Referral - Status >> Dec 15, 2023 12:43 PM Denise Rice wrote: Reason for CRM: Patient is calling to inquire about her referral to Dermatology. She wants to know which location she will be referred to.

## 2023-12-20 NOTE — Progress Notes (Signed)
 Subjective  CC:  Chief Complaint  Patient presents with   scab on ear that she is concerned about    Pt stated that that she noticed an unfamiliar  scab on her lLt ear that has been there for over 3 weeks or more   Cyst    removal    HPI: Denise Rice is a 73 y.o. female who presents to the office today to address the problems listed above in the chief complaint. Discussed the use of AI scribe software for clinical note transcription with the patient, who gave verbal consent to proceed.  History of Present Illness Denise Rice is a 73 year old female who presents with concerns about a scab on her ear and a cyst on her back.  Auricular skin lesion - Papery thin skin on the ear, peeled off without bleeding - Two days later, a large black scab developed at the same site - Scab remained for approximately one week before detaching - Current area is crusty and mildly painful  Dorsal cystic lesion - Large cyst on the back, present for several months - Located over a bony prominence - Causes discomfort, particularly when sleeping - Frequent manipulation and picking at the cyst due to discomfort  Vertigo - Experiences vertigo, requiring caution during physical activities  Nasal congestion - Chronic nasal congestion, consistent with baseline symptoms   Assessment  1. Pigmented skin lesion of uncertain nature   2. Epidermal cyst      Plan  Assessment and Plan Assessment & Plan Inflamed lesion of ear Inflamed, crusty lesion on the ear, initially presented as papery thin skin that was peeled off, followed by a large black scab. The lesion has persisted for about a week and is causing concern for possible skin cancer. Differential diagnosis includes skin cancer, but the lesion's healing nature makes this less likely. The lesion is inflamed and crusty, indicating possible irritation or infection. - Apply steroid cream (triamcinolone  or betamethasone ) twice daily for one week to  reduce inflammation and promote healing. - Referred to dermatology for further evaluation and possible biopsy if the lesion does not heal.  Epidermal cyst of back Large, bothersome cyst on the back, present for a couple of months, located on a bony area and causing discomfort, especially during sleep. The cyst is being picked at due to its bothersome nature. - Referred to dermatology for evaluation and possible removal of the cyst.    Follow up: prn No orders of the defined types were placed in this encounter.  No orders of the defined types were placed in this encounter.    I reviewed the patients updated PMH, FH, and SocHx.  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    Priority: Medium    IFG (impaired fasting glucose) 11/16/2020    Priority: Medium    History of migraine 05/04/2019    Priority: Medium    History of small bowel obstruction 11/17/2017    Priority: Medium    Chronic vertigo 03/11/2017    Priority: Medium    Major depression, chronic 03/11/2017    Priority: Medium    PCOS (polycystic ovarian syndrome) 03/11/2017    Priority: Medium    Insomnia 09/08/2015    Priority: Medium    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   Biotin 10 MG CAPS Take by mouth.   busPIRone  (BUSPAR ) 7.5 MG  tablet Take 1 tablet (7.5 mg total) by mouth 2 (two) times daily as needed (anxiety).   Cholecalciferol (VITAMIN D ) 50 MCG (2000 UT) CAPS Take 2,000 Units by mouth daily.   cyanocobalamin  (VITAMIN B12) 1000 MCG/ML injection Inject 1 mL (1,000 mcg total) into the muscle every 30 (thirty) days.   diazepam  (VALIUM ) 10 MG tablet Take 1 tablet (10 mg total) by mouth daily as needed for anxiety   dicyclomine  (BENTYL ) 20 MG tablet Take 1 tablet (20 mg total) by mouth 2 (two) times daily as needed for spasms (abdominal pain).   ferrous sulfate 325 (65 FE) MG tablet Take 325 mg by mouth daily with breakfast.   mupirocin  ointment (BACTROBAN ) 2 % Apply to affected areas topically 2 (two) times daily.   ondansetron  (ZOFRAN ) 4 MG tablet Take 1 tablet (4 mg total) by mouth every 8 (eight) hours as needed for nausea or vomiting.   Potassium (POTASSIMIN PO) Take 1 tablet by mouth daily.   sulfamethoxazole -trimethoprim  (BACTRIM  DS) 800-160 MG tablet Take 1 tablet by mouth 2 (two) times daily.   Syringe/Needle, Disp, (SYRINGE 3CC/25GX1) 25G X 1 3 ML MISC Use once monthly for injection of Vitamin B-12   traZODone  (DESYREL ) 100 MG tablet Take 1 - 1.5 tablets (100-150 mg total) by mouth at bedtime as needed for sleep.   trimethoprim  (TRIMPEX ) 100 MG tablet Take 1 tablet (100 mg total) by mouth daily.   Allergies: Patient is allergic to lactose, topiramate , and alprazolam . Family History: Patient family history includes Anxiety disorder in her mother; Cancer in her paternal aunt and paternal uncle; Cancer - Prostate in her father; Diabetes in her father, maternal aunt, mother, and sister; Heart attack in her mother; Heart disease in her father, maternal aunt, and mother; High blood pressure in her sister; Hyperlipidemia in her father and mother; Hypertension in her father and mother; Kidney disease in her father and mother; Obesity in her father and mother; Stroke in her father, mother, and sister; Thyroid  disease in  her mother. Social History:  Patient  reports that she has never smoked. She has never used smokeless tobacco. She reports that she does not drink alcohol and does not use drugs.  Review of Systems: Constitutional: Negative for fever malaise or anorexia Cardiovascular: negative for chest pain Respiratory: negative for SOB or persistent cough Gastrointestinal: negative for abdominal pain  Objective  Vitals: BP 130/80   Pulse 71   Temp 98 F (36.7 C)   Ht 5' 1 (1.549 m)   Wt 122 lb 6.4 oz (55.5 kg)   LMP 01/13/2005   SpO2 97%   BMI 23.13 kg/m  General: no acute distress , A&Ox3 Skin: flaking nonhealing lesion left helix Epidermal cyst, not infected upper back Commons side effects, risks, benefits, and alternatives for medications and treatment plan prescribed today were discussed, and the patient expressed understanding of the  given instructions. Patient is instructed to call or message via MyChart if he/she has any questions or concerns regarding our treatment plan. No barriers to understanding were identified. We discussed Red Flag symptoms and signs in detail. Patient expressed understanding regarding what to do in case of urgent or emergency type symptoms.  Medication list was reconciled, printed and provided to the patient in AVS. Patient instructions and summary information was reviewed with the patient as documented in the AVS. This note was prepared with assistance of Dragon voice recognition software. Occasional wrong-word or sound-a-like substitutions may have occurred due to the inherent limitations of voice recognition software

## 2023-12-30 ENCOUNTER — Other Ambulatory Visit: Payer: Self-pay | Admitting: Family Medicine

## 2023-12-30 ENCOUNTER — Other Ambulatory Visit (HOSPITAL_COMMUNITY): Payer: Self-pay

## 2023-12-30 MED ORDER — TRAZODONE HCL 100 MG PO TABS
100.0000 mg | ORAL_TABLET | Freq: Every evening | ORAL | 3 refills | Status: AC | PRN
  Filled 2023-12-30 – 2024-01-01 (×2): qty 45, 30d supply, fill #0
  Filled 2024-01-28: qty 45, 30d supply, fill #1

## 2023-12-31 ENCOUNTER — Other Ambulatory Visit: Payer: Self-pay

## 2023-12-31 ENCOUNTER — Other Ambulatory Visit (HOSPITAL_COMMUNITY): Payer: Self-pay

## 2023-12-31 ENCOUNTER — Telehealth: Payer: Self-pay

## 2023-12-31 NOTE — Telephone Encounter (Signed)
 Gave verbal order to [employer]- Spoke with [place].

## 2023-12-31 NOTE — Telephone Encounter (Signed)
 Copied from CRM [id number]. Topic: Clinical - Medication Question >> Dec 30, 2023  4:51 PM Dedra B wrote: Reason for CRM: [name] from [facility] called to request an early fill for diazepam  (VALIUM ) 10 MG tablet. He said pt will be out of town 12/22-12/28.

## 2024-01-01 ENCOUNTER — Other Ambulatory Visit (HOSPITAL_COMMUNITY): Payer: Self-pay

## 2024-01-01 ENCOUNTER — Encounter: Payer: Self-pay | Admitting: Family

## 2024-01-01 ENCOUNTER — Other Ambulatory Visit: Payer: Self-pay

## 2024-01-11 ENCOUNTER — Other Ambulatory Visit (HOSPITAL_COMMUNITY): Payer: Self-pay

## 2024-01-28 ENCOUNTER — Other Ambulatory Visit: Payer: Self-pay | Admitting: Family Medicine

## 2024-01-28 ENCOUNTER — Other Ambulatory Visit: Payer: Self-pay

## 2024-01-28 NOTE — Telephone Encounter (Signed)
 12/09/2023 LOV  08/12/2023 fill date   60/0 refills

## 2024-01-31 ENCOUNTER — Other Ambulatory Visit (HOSPITAL_COMMUNITY): Payer: Self-pay

## 2024-02-01 ENCOUNTER — Other Ambulatory Visit: Payer: Self-pay

## 2024-02-01 ENCOUNTER — Other Ambulatory Visit (HOSPITAL_COMMUNITY): Payer: Self-pay

## 2024-02-01 MED ORDER — BUSPIRONE HCL 7.5 MG PO TABS
7.5000 mg | ORAL_TABLET | Freq: Two times a day (BID) | ORAL | 5 refills | Status: AC | PRN
  Filled 2024-02-01: qty 60, 30d supply, fill #0

## 2024-02-16 ENCOUNTER — Other Ambulatory Visit: Payer: Self-pay

## 2024-02-29 ENCOUNTER — Ambulatory Visit: Admitting: Dermatology

## 2024-03-03 ENCOUNTER — Ambulatory Visit: Admitting: Dermatology

## 2024-11-28 ENCOUNTER — Ambulatory Visit
